# Patient Record
Sex: Male | Born: 1940 | Race: White | Hispanic: No | Marital: Married | Smoking: Former smoker
Health system: Southern US, Community
[De-identification: ages and names within clinical notes are randomized; demographics above are authoritative.]

## PROBLEM LIST (undated history)

## (undated) DIAGNOSIS — J449 Chronic obstructive pulmonary disease, unspecified: Secondary | ICD-10-CM

## (undated) DIAGNOSIS — I10 Essential (primary) hypertension: Secondary | ICD-10-CM

## (undated) DIAGNOSIS — Z950 Presence of cardiac pacemaker: Secondary | ICD-10-CM

## (undated) DIAGNOSIS — E785 Hyperlipidemia, unspecified: Secondary | ICD-10-CM

## (undated) DIAGNOSIS — I509 Heart failure, unspecified: Secondary | ICD-10-CM

## (undated) DIAGNOSIS — K219 Gastro-esophageal reflux disease without esophagitis: Secondary | ICD-10-CM

## (undated) DIAGNOSIS — N289 Disorder of kidney and ureter, unspecified: Secondary | ICD-10-CM

## (undated) HISTORY — PX: OTHER SURGICAL HISTORY: SHX169

---

## 1998-03-28 ENCOUNTER — Encounter: Payer: Self-pay | Admitting: Cardiovascular Disease

## 1998-03-28 ENCOUNTER — Ambulatory Visit (HOSPITAL_COMMUNITY): Admission: RE | Admit: 1998-03-28 | Discharge: 1998-03-28 | Payer: Self-pay | Admitting: Cardiovascular Disease

## 2000-10-24 ENCOUNTER — Inpatient Hospital Stay (HOSPITAL_COMMUNITY): Admission: AD | Admit: 2000-10-24 | Discharge: 2000-10-25 | Payer: Self-pay | Admitting: Cardiovascular Disease

## 2003-09-27 ENCOUNTER — Other Ambulatory Visit: Payer: Self-pay

## 2008-11-08 ENCOUNTER — Emergency Department: Payer: Self-pay | Admitting: Emergency Medicine

## 2008-11-08 IMAGING — CR DG CHEST 1V PORT
1 series · 1 of 1 positions shown · non-contrast
Comparison: none

REASON FOR EXAM: Chest Pain
COMMENTS:

PROCEDURE:     DXR - DXR PORTABLE CHEST SINGLE VIEW  - [DATE] [DATE]
RESULT:     Portable AP view of the chest shows the lung fields to be clear
of infiltrate. No pneumonia, pneumothorax or pleural effusion is seen. Heart
size is normal. Monitoring electrodes are present.

[view not recorded]
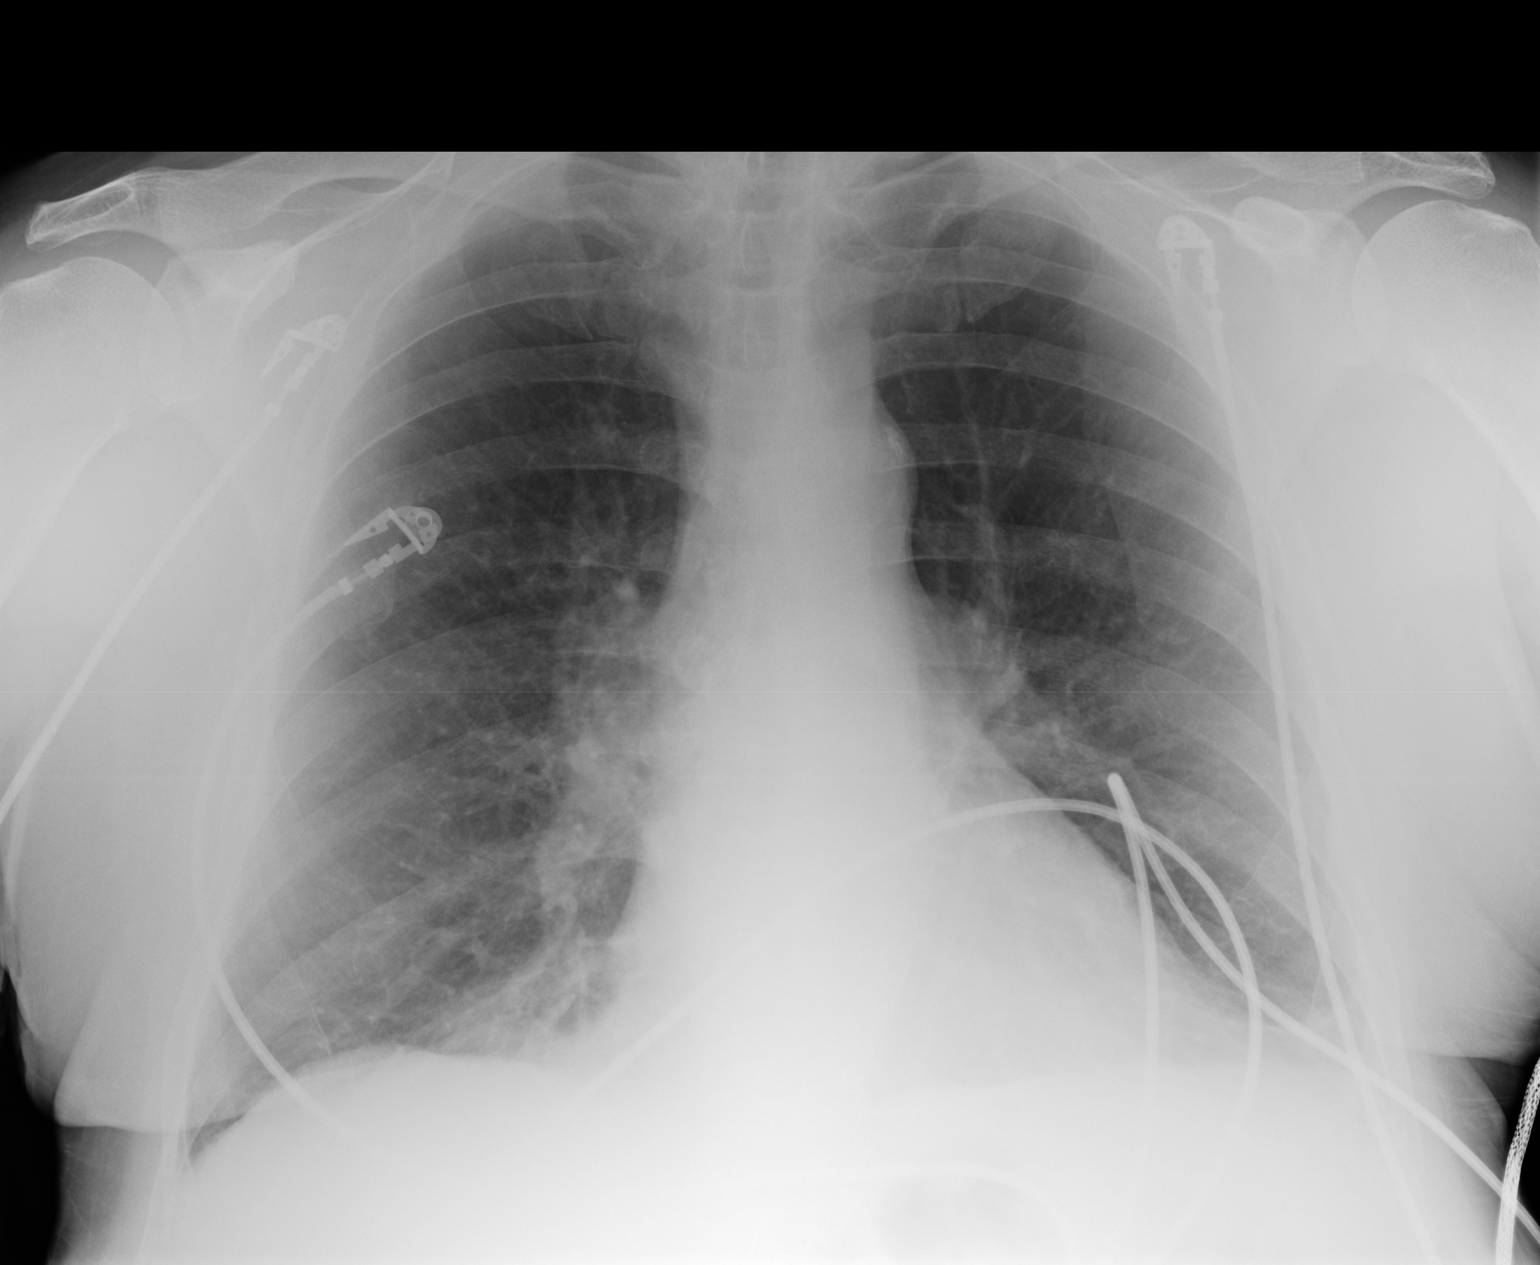

[1 of 1 positions shown; findings below may reference images not displayed]

IMPRESSION: No acute changes are identified.

## 2013-01-04 ENCOUNTER — Ambulatory Visit: Payer: Self-pay

## 2013-02-01 ENCOUNTER — Emergency Department: Payer: Self-pay | Admitting: Emergency Medicine

## 2013-02-01 LAB — BASIC METABOLIC PANEL
Anion Gap: 5 — ABNORMAL LOW (ref 7–16)
BUN: 23 mg/dL — ABNORMAL HIGH (ref 7–18)
Calcium, Total: 8.8 mg/dL (ref 8.5–10.1)
Chloride: 105 mmol/L (ref 98–107)
Co2: 29 mmol/L (ref 21–32)
Creatinine: 1.17 mg/dL (ref 0.60–1.30)
EGFR (African American): >60
EGFR (Non-African Amer.): >60
Glucose: 99 mg/dL (ref 65–99)
Osmolality: 281 (ref 275–301)
Potassium: 4.3 mmol/L (ref 3.5–5.1)
Sodium: 139 mmol/L (ref 136–145)

## 2013-02-01 LAB — CBC
HCT: 40.9 % (ref 40.0–52.0)
HGB: 13.8 g/dL (ref 13.0–18.0)
MCH: 30.7 pg (ref 26.0–34.0)
MCHC: 33.8 g/dL (ref 32.0–36.0)
MCV: 91 fL (ref 80–100)
Platelet: 150 10*3/uL (ref 150–440)
RBC: 4.51 10*6/uL (ref 4.40–5.90)
RDW: 13.2 % (ref 11.5–14.5)
WBC: 6.1 10*3/uL (ref 3.8–10.6)

## 2013-02-01 LAB — TROPONIN I: Troponin-I: <0.02 ng/mL

## 2013-02-01 IMAGING — CR DG CHEST 1V PORT
1 series · 1 of 1 positions shown · non-contrast
Comparison: [DATE]

CLINICAL DATA: Chest pain

EXAM:
PORTABLE CHEST - 1 VIEW

[ap]
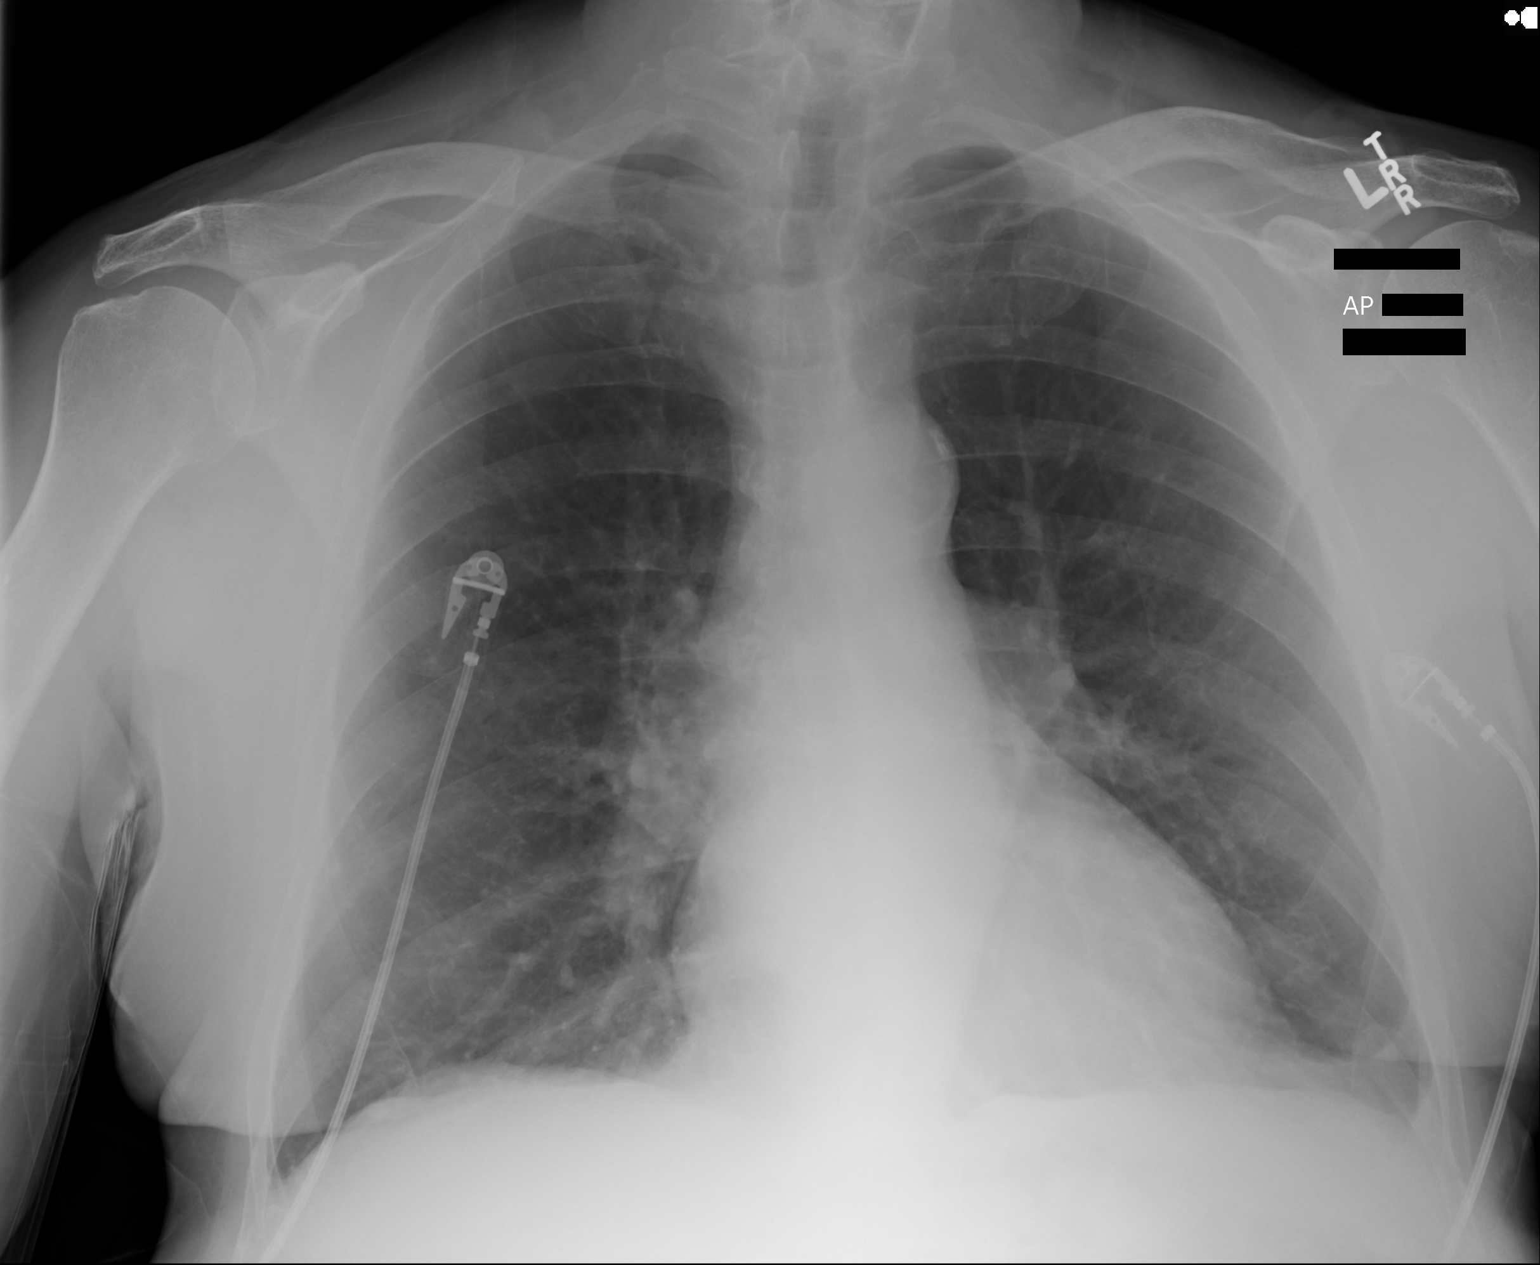

[1 of 1 positions shown; findings below may reference images not displayed]

FINDINGS: Heart size upper normal to mildly enlarged. Central vascular
prominence is similar to prior. Aortic atherosclerosis. Mild
interstitial prominence. No consolidation, pleural effusion, or
pneumothorax. Mild left lung base pleural thickening. Multilevel
degenerative changes
IMPRESSION: Heart size upper normal.  Central vascular prominence is similar.

Mild interstitial prominence, favored to be chronic. No focal
consolidation.

## 2019-11-20 ENCOUNTER — Other Ambulatory Visit: Payer: Self-pay

## 2019-11-20 ENCOUNTER — Encounter: Payer: No Typology Code available for payment source | Attending: Internal Medicine

## 2019-11-20 DIAGNOSIS — Z87891 Personal history of nicotine dependence: Secondary | ICD-10-CM | POA: Insufficient documentation

## 2019-11-20 DIAGNOSIS — J449 Chronic obstructive pulmonary disease, unspecified: Secondary | ICD-10-CM

## 2019-11-20 DIAGNOSIS — Z79899 Other long term (current) drug therapy: Secondary | ICD-10-CM | POA: Insufficient documentation

## 2019-11-20 DIAGNOSIS — Z7901 Long term (current) use of anticoagulants: Secondary | ICD-10-CM | POA: Insufficient documentation

## 2019-11-20 NOTE — Progress Notes (Signed)
Virtual Visit completed. Patient informed on EP and RD appointment and 6 Minute walk test. Patient also informed of patient health questionnaires on My Chart. Patient Verbalizes understanding. Visit diagnosis can be found in Media, patient is [facility]. 

## 2019-11-22 ENCOUNTER — Other Ambulatory Visit: Payer: Self-pay

## 2019-11-22 VITALS — Ht 64.5 in | Wt 182.6 lb

## 2019-11-22 DIAGNOSIS — Z7901 Long term (current) use of anticoagulants: Secondary | ICD-10-CM | POA: Diagnosis not present

## 2019-11-22 DIAGNOSIS — Z87891 Personal history of nicotine dependence: Secondary | ICD-10-CM | POA: Diagnosis not present

## 2019-11-22 DIAGNOSIS — Z79899 Other long term (current) drug therapy: Secondary | ICD-10-CM | POA: Diagnosis not present

## 2019-11-22 DIAGNOSIS — J449 Chronic obstructive pulmonary disease, unspecified: Secondary | ICD-10-CM

## 2019-11-22 NOTE — Progress Notes (Signed)
Pulmonary Individual Treatment Plan  Patient Details  Name: Phillip Chavez MRN: [id number] Date of Birth: January 24, 1941 Referring Provider:     Pulmonary Rehab from 11/22/2019 in [facility] Cardiac and Pulmonary Rehab  Referring Provider [name]      Initial Encounter Date:    Pulmonary Rehab from 11/22/2019 in [facility] Cardiac and Pulmonary Rehab  Date 11/22/19      Visit Diagnosis: Chronic obstructive pulmonary disease, unspecified COPD type ([place])  Patient's Home Medications on Admission:  Current Outpatient Medications:    albuterol (VENTOLIN HFA) 108 (90 Base) MCG/ACT inhaler, Inhale 2 puffs into the lungs 4 (four) times daily as needed for wheezing or shortness of breath., Disp: , Rfl:    docusate sodium (COLACE) 100 MG capsule, Take 100 mg by mouth 2 (two) times daily., Disp: , Rfl:    Fluticasone-Salmeterol (ADVAIR) 250-50 MCG/DOSE AEPB, Inhale 1 puff into the lungs 2 (two) times daily., Disp: , Rfl:    ketotifen (ZADITOR) 0.025 % ophthalmic solution, 1 drop 2 (two) times daily., Disp: , Rfl:    losartan (COZAAR) 50 MG tablet, Take 50 mg by mouth daily., Disp: , Rfl:    omeprazole (PRILOSEC) 20 MG capsule, Take 20 mg by mouth daily., Disp: , Rfl:    pseudoephedrine-acetaminophen (TYLENOL SINUS) 30-500 MG TABS tablet, Take 1 tablet by mouth every 4 (four) hours as needed., Disp: , Rfl:    rosuvastatin (CRESTOR) 40 MG tablet, Take 40 mg by mouth daily., Disp: , Rfl:   Past Medical History: No past medical history on file.  Tobacco Use: Social History   Tobacco Use  Smoking Status Former Smoker   Packs/day: 1.00   Years: 20.00   Pack years: 20.00   Types: Cigarettes   Quit date: 03/21/1978   Years since quitting: 41.7  Smokeless Tobacco Never Used    Labs: Recent Review Flowsheet Data   There is no flowsheet data to display.      Pulmonary Assessment Scores:  Pulmonary Assessment Scores    Row Name 11/22/19 1517         ADL UCSD   SOB Score total 41      Rest 1     Walk 2     Stairs 4     Bath 2     Dress 0       CAT Score   CAT Score 16       mMRC Score   mMRC Score 1            UCSD: Self-administered rating of dyspnea associated with activities of daily living (ADLs) 6-point scale (0 = "not at all" to 5 = "maximal or unable to do because of breathlessness")  Scoring Scores range from 0 to 120.  Minimally important difference is 5 units  CAT: CAT can identify the health impairment of COPD patients and is better correlated with disease progression.  CAT has a scoring range of zero to 40. The CAT score is classified into four groups of low (less than 10), medium (10 - 20), high (21-30) and very high (31-40) based on the impact level of disease on health status. A CAT score over 10 suggests significant symptoms.  A worsening CAT score could be explained by an exacerbation, poor medication adherence, poor inhaler technique, or progression of COPD or comorbid conditions.  CAT MCID is 2 points  mMRC: mMRC (Modified Medical Research Council) Dyspnea Scale is used to assess the degree of baseline functional disability in patients of respiratory  disease due to dyspnea. No minimal important difference is established. A decrease in score of 1 point or greater is considered a positive change.   Pulmonary Function Assessment:  Pulmonary Function Assessment - 11/20/19 0837      Initial Spirometry Results   FVC% 81 %    FEV1% 62 %    FEV1/FVC Ratio 59    Comments No post bronchodialator results      Breath   Shortness of Breath Yes;Limiting activity           Exercise Target Goals: Exercise Program Goal: Individual exercise prescription set using results from initial 6 min walk test and THRR while considering  patients activity barriers and safety.   Exercise Prescription Goal: Initial exercise prescription builds to 30-45 minutes a day of aerobic activity, 2-3 days per week.  Home exercise guidelines will be given to patient  during program as part of exercise prescription that the participant will acknowledge.  Education: Aerobic Exercise & Resistance Training: - Gives group verbal and written instruction on the various components of exercise. Focuses on aerobic and resistive training programs and the benefits of this training and how to safely progress through these programs..   Education: Exercise & Equipment Safety: - Individual verbal instruction and demonstration of equipment use and safety with use of the equipment.   Pulmonary Rehab from 11/22/2019 in [facility] Cardiac and Pulmonary Rehab  Date 11/22/19  Educator AS  Instruction Review Code 1- Verbalizes Understanding      Education: Exercise Physiology & General Exercise Guidelines: - Group verbal and written instruction with models to review the exercise physiology of the cardiovascular system and associated critical values. Provides general exercise guidelines with specific guidelines to those with heart or lung disease.    Education: Flexibility, Balance, Mind/Body Relaxation: Provides group verbal/written instruction on the benefits of flexibility and balance training, including mind/body exercise modes such as yoga, pilates and tai chi.  Demonstration and skill practice provided.   Activity Barriers & Risk Stratification:   6 Minute Walk:  6 Minute Walk    Row Name 11/22/19 1507         6 Minute Walk   Phase Initial     Distance 1160 feet     Walk Time 6 minutes     # of Rest Breaks 0     MPH 2.2     METS 1.9     RPE 11     Perceived Dyspnea  1     VO2 Peak 6.7     Symptoms No     Resting HR 68 bpm     Resting BP 124/64     Resting Oxygen Saturation  94 %     Exercise Oxygen Saturation  during 6 min walk 88 %     Max Ex. HR 100 bpm     Max Ex. BP 134/64     2 Minute Post BP 108/66       Interval HR   1 Minute HR 86     2 Minute HR 86     3 Minute HR 97     4 Minute HR 100     5 Minute HR 96     6 Minute HR 99     2 Minute  Post HR 80     Interval Heart Rate? Yes       Interval Oxygen   Interval Oxygen? Yes     Baseline Oxygen Saturation % 94 %  1 Minute Oxygen Saturation % 91 %     1 Minute Liters of Oxygen 0 L     2 Minute Oxygen Saturation % 88 %     2 Minute Liters of Oxygen 0 L     3 Minute Oxygen Saturation % 89 %     3 Minute Liters of Oxygen 0 L     4 Minute Oxygen Saturation % 88 %     4 Minute Liters of Oxygen 0 L     5 Minute Oxygen Saturation % 89 %     5 Minute Liters of Oxygen 0 L     6 Minute Oxygen Saturation % 88 %     6 Minute Liters of Oxygen 0 L     2 Minute Post Oxygen Saturation % 93 %     2 Minute Post Liters of Oxygen 0 L           Oxygen Initial Assessment:  Oxygen Initial Assessment - 11/20/19 0836      Home Oxygen   Home Oxygen Device None    Sleep Oxygen Prescription None    Home Exercise Oxygen Prescription None    Home Resting Oxygen Prescription None    Compliance with Home Oxygen Use Yes      Initial 6 min Walk   Oxygen Used None      Program Oxygen Prescription   Program Oxygen Prescription None      Intervention   Short Term Goals To learn and understand importance of monitoring SPO2 with pulse oximeter and demonstrate accurate use of the pulse oximeter.;To learn and understand importance of maintaining oxygen saturations>88%;To learn and demonstrate proper pursed lip breathing techniques or other breathing techniques.;To learn and demonstrate proper use of respiratory medications    Long  Term Goals Verbalizes importance of monitoring SPO2 with pulse oximeter and return demonstration;Maintenance of O2 saturations>88%;Exhibits proper breathing techniques, such as pursed lip breathing or other method taught during program session;Compliance with respiratory medication;Demonstrates proper use of MDIs           Oxygen Re-Evaluation:   Oxygen Discharge (Final Oxygen Re-Evaluation):   Initial Exercise Prescription:  Initial Exercise Prescription -  11/22/19 1500      Date of Initial Exercise RX and Referring Provider   Date 11/22/19    Referring Provider [name]      Treadmill   MPH 1.5    Grade 0.5    Minutes 15    METs 2      Recumbant Bike   Level 1    RPM 60    Minutes 15    METs 2      NuStep   Level 2    SPM 80    Minutes 15    METs 2      T5 Nustep   Level 1    SPM 80    Minutes 15    METs 2      Prescription Details   Frequency (times per week) 3    Duration Progress to 30 minutes of continuous aerobic without signs/symptoms of physical distress      Intensity   THRR 40-80% of Max Heartrate 97-126    Ratings of Perceived Exertion 11-15    Perceived Dyspnea 0-4      Progression   Progression Continue to progress workloads to maintain intensity without signs/symptoms of physical distress.      Resistance Training   Training Prescription Yes    Weight 3  lb    Reps 10-15           Perform Capillary Blood Glucose checks as needed.  Exercise Prescription Changes:  Exercise Prescription Changes    Row Name 11/22/19 1500             Response to Exercise   Blood Pressure (Admit) 124/64       Blood Pressure (Exercise) 134/64       Blood Pressure (Exit) 108/66       Heart Rate (Admit) 68 bpm       Heart Rate (Exercise) 100 bpm       Heart Rate (Exit) 80 bpm       Oxygen Saturation (Admit) 94 %       Oxygen Saturation (Exercise) 88 %       Oxygen Saturation (Exit) 93 %       Rating of Perceived Exertion (Exercise) 11       Perceived Dyspnea (Exercise) 1       Symptoms none              Exercise Comments:   Exercise Goals and Review:  Exercise Goals    Row Name 11/22/19 1514             Exercise Goals   Increase Physical Activity Yes       Intervention Provide advice, education, support and counseling about physical activity/exercise needs.;Develop an individualized exercise prescription for aerobic and resistive training based on initial evaluation findings, risk  stratification, comorbidities and participant's personal goals.       Expected Outcomes Short Term: Attend rehab on a regular basis to increase amount of physical activity.;Long Term: Add in home exercise to make exercise part of routine and to increase amount of physical activity.;Long Term: Exercising regularly at least 3-5 days a week.       Increase Strength and Stamina Yes       Intervention Provide advice, education, support and counseling about physical activity/exercise needs.;Develop an individualized exercise prescription for aerobic and resistive training based on initial evaluation findings, risk stratification, comorbidities and participant's personal goals.       Expected Outcomes Short Term: Increase workloads from initial exercise prescription for resistance, speed, and METs.;Short Term: Perform resistance training exercises routinely during rehab and add in resistance training at home;Long Term: Improve cardiorespiratory fitness, muscular endurance and strength as measured by increased METs and functional capacity (6MWT)       Able to understand and use rate of perceived exertion (RPE) scale Yes       Intervention Provide education and explanation on how to use RPE scale       Expected Outcomes Short Term: Able to use RPE daily in rehab to express subjective intensity level;Long Term:  Able to use RPE to guide intensity level when exercising independently       Able to understand and use Dyspnea scale Yes       Intervention Provide education and explanation on how to use Dyspnea scale       Expected Outcomes Short Term: Able to use Dyspnea scale daily in rehab to express subjective sense of shortness of breath during exertion;Long Term: Able to use Dyspnea scale to guide intensity level when exercising independently       Knowledge and understanding of Target Heart Rate Range (THRR) Yes       Intervention Provide education and explanation of THRR including how the numbers were predicted  and where they are located for  reference       Expected Outcomes Short Term: Able to state/look up THRR;Short Term: Able to use daily as guideline for intensity in rehab;Long Term: Able to use THRR to govern intensity when exercising independently       Able to check pulse independently Yes       Intervention Provide education and demonstration on how to check pulse in carotid and radial arteries.;Review the importance of being able to check your own pulse for safety during independent exercise       Expected Outcomes Short Term: Able to explain why pulse checking is important during independent exercise;Long Term: Able to check pulse independently and accurately       Understanding of Exercise Prescription Yes              Exercise Goals Re-Evaluation :   Discharge Exercise Prescription (Final Exercise Prescription Changes):  Exercise Prescription Changes - 11/22/19 1500      Response to Exercise   Blood Pressure (Admit) 124/64    Blood Pressure (Exercise) 134/64    Blood Pressure (Exit) 108/66    Heart Rate (Admit) 68 bpm    Heart Rate (Exercise) 100 bpm    Heart Rate (Exit) 80 bpm    Oxygen Saturation (Admit) 94 %    Oxygen Saturation (Exercise) 88 %    Oxygen Saturation (Exit) 93 %    Rating of Perceived Exertion (Exercise) 11    Perceived Dyspnea (Exercise) 1    Symptoms none           Nutrition:  Target Goals: Understanding of nutrition guidelines, daily intake of sodium <1541m, cholesterol <202m calories 30% from fat and 7% or less from saturated fats, daily to have 5 or more servings of fruits and vegetables.  Education: Controlling Sodium/Reading Food Labels -Group verbal and written material supporting the discussion of sodium use in heart healthy nutrition. Review and explanation with models, verbal and written materials for utilization of the food label.   Education: General Nutrition Guidelines/Fats and Fiber: -Group instruction provided by verbal, written  material, models and posters to present the general guidelines for heart healthy nutrition. Gives an explanation and review of dietary fats and fiber.   Biometrics:  Pre Biometrics - 11/22/19 1514      Pre Biometrics   Height 5' 4.5" (1.638 m)    Weight 182 lb 9.6 oz (82.8 kg)    BMI (Calculated) 30.87    Single Leg Stand 22.35 seconds            Nutrition Therapy Plan and Nutrition Goals:   Nutrition Assessments:  Nutrition Assessments - 11/22/19 1516      Rate Your Plate Scores   Pre Score 28           MEDIFICTS Score Key:          ?70 Need to make dietary changes          40-70 Heart Healthy Diet         ? 40 Therapeutic Level Cholesterol Diet  Nutrition Goals Re-Evaluation:   Nutrition Goals Discharge (Final Nutrition Goals Re-Evaluation):   Psychosocial: Target Goals: Acknowledge presence or absence of significant depression and/or stress, maximize coping skills, provide positive support system. Participant is able to verbalize types and ability to use techniques and skills needed for reducing stress and depression.   Education: Depression - Provides group verbal and written instruction on the correlation between heart/lung disease and depressed mood, treatment options, and the stigmas associated with  seeking treatment.   Education: Sleep Hygiene -Provides group verbal and written instruction about how sleep can affect your health.  Define sleep hygiene, discuss sleep cycles and impact of sleep habits. Review good sleep hygiene tips.    Education: Stress and Anxiety: - Provides group verbal and written instruction about the health risks of elevated stress and causes of high stress.  Discuss the correlation between heart/lung disease and anxiety and treatment options. Review healthy ways to manage with stress and anxiety.   Initial Review & Psychosocial Screening:  Initial Psych Review & Screening - 11/20/19 0840      Initial Review   Current issues  with None Identified      Family Dynamics   Good Support System? Yes    Comments He can look to his wife and son for support. He is willing to stay healthy and has a positive attitude.      Barriers   Psychosocial barriers to participate in program There are no identifiable barriers or psychosocial needs.;The patient should benefit from training in stress management and relaxation.      Screening Interventions   Interventions Encouraged to exercise;To provide support and resources with identified psychosocial needs;Provide feedback about the scores to participant    Expected Outcomes Short Term goal: Utilizing psychosocial counselor, staff and physician to assist with identification of specific Stressors or current issues interfering with healing process. Setting desired goal for each stressor or current issue identified.;Long Term Goal: Stressors or current issues are controlled or eliminated.;Short Term goal: Identification and review with participant of any Quality of Life or Depression concerns found by scoring the questionnaire.;Long Term goal: The participant improves quality of Life and PHQ9 Scores as seen by post scores and/or verbalization of changes           Quality of Life Scores:  Scores of 19 and below usually indicate a poorer quality of life in these areas.  A difference of  2-3 points is a clinically meaningful difference.  A difference of 2-3 points in the total score of the Quality of Life Index has been associated with significant improvement in overall quality of life, self-image, physical symptoms, and general health in studies assessing change in quality of life.  PHQ-9: Recent Review Flowsheet Data    Depression screen [facility] 2/9 11/22/2019   Decreased Interest 0   Down, Depressed, Hopeless 0   PHQ - 2 Score 0   Altered sleeping 2    Tired, decreased energy 1   Change in appetite 2   Feeling bad or failure about yourself  0   Trouble concentrating 0   Moving slowly  or fidgety/restless 0   Suicidal thoughts 0   PHQ-9 Score 5   Difficult doing work/chores Not difficult at all     Interpretation of Total Score  Total Score Depression Severity:  1-4 = Minimal depression, 5-9 = Mild depression, 10-14 = Moderate depression, 15-19 = Moderately severe depression, 20-27 = Severe depression   Psychosocial Evaluation and Intervention:  Psychosocial Evaluation - 11/20/19 0842      Psychosocial Evaluation & Interventions   Interventions Encouraged to exercise with the program and follow exercise prescription    Comments He can look to his wife and son for support. He is willing to stay healthy and has a positive attitude.    Expected Outcomes Short: Exercise regularly to support mental health and notify staff of any changes. Long: maintain mental health and well being through teaching of rehab or prescribed  medications independently.    Continue Psychosocial Services  Follow up required by staff           Psychosocial Re-Evaluation:   Psychosocial Discharge (Final Psychosocial Re-Evaluation):   Education: Education Goals: Education classes will be provided on a weekly basis, covering required topics. Participant will state understanding/return demonstration of topics presented.  Learning Barriers/Preferences:  Learning Barriers/Preferences - 11/20/19 0837      Learning Barriers/Preferences   Learning Barriers None    Learning Preferences None           General Pulmonary Education Topics:  Infection Prevention: - Provides verbal and written material to individual with discussion of infection control including proper hand washing and proper equipment cleaning during exercise session.   Pulmonary Rehab from 11/22/2019 in [facility] Cardiac and Pulmonary Rehab  Date 11/22/19  Educator AS  Instruction Review Code 1- Verbalizes Understanding      Falls Prevention: - Provides verbal and written material to individual with discussion of falls  prevention and safety.   Pulmonary Rehab from 11/22/2019 in [facility] Cardiac and Pulmonary Rehab  Date 11/22/19  Educator AS  Instruction Review Code 1- Verbalizes Understanding      Chronic Lung Diseases: - Group verbal and written instruction to review updates, respiratory medications, advancements in procedures and treatments. Discuss use of supplemental oxygen including available portable oxygen systems, continuous and intermittent flow rates, concentrators, personal use and safety guidelines. Review proper use of inhaler and spacers. Provide informative websites for self-education.    Energy Conservation: - Provide group verbal and written instruction for methods to conserve energy, plan and organize activities. Instruct on pacing techniques, use of adaptive equipment and posture/positioning to relieve shortness of breath.   Triggers and Exacerbations: - Group verbal and written instruction to review types of environmental triggers and ways to prevent exacerbations. Discuss weather changes, air quality and the benefits of nasal washing. Review warning signs and symptoms to help prevent infections. Discuss techniques for effective airway clearance, coughing, and vibrations.   AED/CPR: - Group verbal and written instruction with the use of models to demonstrate the basic use of the AED with the basic ABC's of resuscitation.   Anatomy and Physiology of the Lungs: - Group verbal and written instruction with the use of models to provide basic lung anatomy and physiology related to function, structure and complications of lung disease.   Anatomy & Physiology of the Heart: - Group verbal and written instruction and models provide basic cardiac anatomy and physiology, with the coronary electrical and arterial systems. Review of Valvular disease and Heart Failure   Cardiac Medications: - Group verbal and written instruction to review commonly prescribed medications for heart disease. Reviews  the medication, class of the drug, and side effects.   Other: -Provides group and verbal instruction on various topics (see comments)   Knowledge Questionnaire Score:  Knowledge Questionnaire Score - 11/22/19 1516      Knowledge Questionnaire Score   Pre Score 15/18            Core Components/Risk Factors/Patient Goals at Admission:  Personal Goals and Risk Factors at Admission - 11/22/19 1515      Core Components/Risk Factors/Patient Goals on Admission    Weight Management Yes;Weight Loss    Intervention Weight Management: Provide education and appropriate resources to help participant work on and attain dietary goals.;Weight Management: Develop a combined nutrition and exercise program designed to reach desired caloric intake, while maintaining appropriate intake of nutrient and fiber, sodium and fats,  and appropriate energy expenditure required for the weight goal.;Weight Management/Obesity: Establish reasonable short term and long term weight goals.    Admit Weight 182 lb 9.6 oz (82.8 kg)    Goal Weight: Short Term 175 lb (79.4 kg)    Goal Weight: Long Term 170 lb (77.1 kg)    Expected Outcomes Short Term: Continue to assess and modify interventions until short term weight is achieved;Long Term: Adherence to nutrition and physical activity/exercise program aimed toward attainment of established weight goal;Weight Maintenance: Understanding of the daily nutrition guidelines, which includes 25-35% calories from fat, 7% or less cal from saturated fats, less than 282m cholesterol, less than 1.5gm of sodium, & 5 or more servings of fruits and vegetables daily;Weight Loss: Understanding of general recommendations for a balanced deficit meal plan, which promotes 1-2 lb weight loss per week and includes a negative energy balance of [phone] kcal/d;Understanding recommendations for meals to include 15-35% energy as protein, 25-35% energy from fat, 35-60% energy from carbohydrates, less than  2060mof dietary cholesterol, 20-35 gm of total fiber daily;Understanding of distribution of calorie intake throughout the day with the consumption of 4-5 meals/snacks    Improve shortness of breath with ADL's Yes    Intervention Provide education, individualized exercise plan and daily activity instruction to help decrease symptoms of SOB with activities of daily living.    Expected Outcomes Short Term: Improve cardiorespiratory fitness to achieve a reduction of symptoms when performing ADLs;Long Term: Be able to perform more ADLs without symptoms or delay the onset of symptoms    Hypertension Yes    Intervention Provide education on lifestyle modifcations including regular physical activity/exercise, weight management, moderate sodium restriction and increased consumption of fresh fruit, vegetables, and low fat dairy, alcohol moderation, and smoking cessation.;Monitor prescription use compliance.    Expected Outcomes Short Term: Continued assessment and intervention until BP is < 140/9037mG in hypertensive participants. < 130/53m84m in hypertensive participants with diabetes, heart failure or chronic kidney disease.;Long Term: Maintenance of blood pressure at goal levels.    Lipids Yes    Intervention Provide education and support for participant on nutrition & aerobic/resistive exercise along with prescribed medications to achieve LDL <70mg45mL >40mg.62mExpected Outcomes Short Term: Participant states understanding of desired cholesterol values and is compliant with medications prescribed. Participant is following exercise prescription and nutrition guidelines.;Long Term: Cholesterol controlled with medications as prescribed, with individualized exercise RX and with personalized nutrition plan. Value goals: LDL < 70mg, 25m> 40 mg.           Education:Diabetes - Individual verbal and written instruction to review signs/symptoms of diabetes, desired ranges of glucose level fasting, after meals  and with exercise. Acknowledge that pre and post exercise glucose checks will be done for 3 sessions at entry of program.   Education: Know Your Numbers and Risk Factors: -Group verbal and written instruction about important numbers in your health.  Discussion of what are risk factors and how they play a role in the disease process.  Review of Cholesterol, Blood Pressure, Diabetes, and BMI and the role they play in your overall health.   Core Components/Risk Factors/Patient Goals Review:    Core Components/Risk Factors/Patient Goals at Discharge (Final Review):    ITP Comments:  ITP Comments    Row Name 11/20/19 0844           ITP Comments Virtual Visit completed. Patient informed on EP and RD appointment and 6 Minute walk test. Patient  also informed of patient health questionnaires on My Chart. Patient Verbalizes understanding. Visit diagnosis can be found in Media, patient is [place].              Comments: initial ITP

## 2019-11-22 NOTE — Patient Instructions (Signed)
Patient Instructions  Patient Details  Name: Phillip Chavez MRN: [id number] Date of Birth: 04/11/1940 Referring Provider:  Felipa Eth, MD  Below are your personal goals for exercise, nutrition, and risk factors. Our goal is to help you stay on track towards obtaining and maintaining these goals. We will be discussing your progress on these goals with you throughout the program.  Initial Exercise Prescription:  Initial Exercise Prescription - 11/22/19 1500      Date of Initial Exercise RX and Referring Provider   Date 11/22/19    Referring Provider [name]      Treadmill   MPH 1.5    Grade 0.5    Minutes 15    METs 2      Recumbant Bike   Level 1    RPM 60    Minutes 15    METs 2      NuStep   Level 2    SPM 80    Minutes 15    METs 2      T5 Nustep   Level 1    SPM 80    Minutes 15    METs 2      Prescription Details   Frequency (times per week) 3    Duration Progress to 30 minutes of continuous aerobic without signs/symptoms of physical distress      Intensity   THRR 40-80% of Max Heartrate 97-126    Ratings of Perceived Exertion 11-15    Perceived Dyspnea 0-4      Progression   Progression Continue to progress workloads to maintain intensity without signs/symptoms of physical distress.      Resistance Training   Training Prescription Yes    Weight 3 lb    Reps 10-15           Exercise Goals: Frequency: Be able to perform aerobic exercise two to three times per week in program working toward 2-5 days per week of home exercise.  Intensity: Work with a perceived exertion of 11 (fairly light) - 15 (hard) while following your exercise prescription.  We will make changes to your prescription with you as you progress through the program.   Duration: Be able to do 30 to 45 minutes of continuous aerobic exercise in addition to a 5 minute warm-up and a 5 minute cool-down routine.   Nutrition Goals: Your personal nutrition goals will be established  when you do your nutrition analysis with the dietician.  The following are general nutrition guidelines to follow: Cholesterol < 200mg /day Sodium < 1500mg /day Fiber: Men over 50 yrs - 30 grams per day  Personal Goals:  Personal Goals and Risk Factors at Admission - 11/22/19 1515      Core Components/Risk Factors/Patient Goals on Admission    Weight Management Yes;Weight Loss    Intervention Weight Management: Provide education and appropriate resources to help participant work on and attain dietary goals.;Weight Management: Develop a combined nutrition and exercise program designed to reach desired caloric intake, while maintaining appropriate intake of nutrient and fiber, sodium and fats, and appropriate energy expenditure required for the weight goal.;Weight Management/Obesity: Establish reasonable short term and long term weight goals.    Admit Weight 182 lb 9.6 oz (82.8 kg)    Goal Weight: Short Term 175 lb (79.4 kg)    Goal Weight: Long Term 170 lb (77.1 kg)    Expected Outcomes Short Term: Continue to assess and modify interventions until short term weight is achieved;Long Term: Adherence to nutrition and  physical activity/exercise program aimed toward attainment of established weight goal;Weight Maintenance: Understanding of the daily nutrition guidelines, which includes 25-35% calories from fat, 7% or less cal from saturated fats, less than 200mg  cholesterol, less than 1.5gm of sodium, & 5 or more servings of fruits and vegetables daily;Weight Loss: Understanding of general recommendations for a balanced deficit meal plan, which promotes 1-2 lb weight loss per week and includes a negative energy balance of [phone] kcal/d;Understanding recommendations for meals to include 15-35% energy as protein, 25-35% energy from fat, 35-60% energy from carbohydrates, less than 200mg  of dietary cholesterol, 20-35 gm of total fiber daily;Understanding of distribution of calorie intake throughout the day  with the consumption of 4-5 meals/snacks    Improve shortness of breath with ADL's Yes    Intervention Provide education, individualized exercise plan and daily activity instruction to help decrease symptoms of SOB with activities of daily living.    Expected Outcomes Short Term: Improve cardiorespiratory fitness to achieve a reduction of symptoms when performing ADLs;Long Term: Be able to perform more ADLs without symptoms or delay the onset of symptoms    Hypertension Yes    Intervention Provide education on lifestyle modifcations including regular physical activity/exercise, weight management, moderate sodium restriction and increased consumption of fresh fruit, vegetables, and low fat dairy, alcohol moderation, and smoking cessation.;Monitor prescription use compliance.    Expected Outcomes Short Term: Continued assessment and intervention until BP is < 140/63mm HG in hypertensive participants. < 130/1mm HG in hypertensive participants with diabetes, heart failure or chronic kidney disease.;Long Term: Maintenance of blood pressure at goal levels.    Lipids Yes    Intervention Provide education and support for participant on nutrition & aerobic/resistive exercise along with prescribed medications to achieve LDL 70mg , HDL >40mg .    Expected Outcomes Short Term: Participant states understanding of desired cholesterol values and is compliant with medications prescribed. Participant is following exercise prescription and nutrition guidelines.;Long Term: Cholesterol controlled with medications as prescribed, with individualized exercise RX and with personalized nutrition plan. Value goals: LDL < 70mg , HDL > 40 mg.           Tobacco Use Initial Evaluation: Social History   Tobacco Use  Smoking Status Former Smoker  . Packs/day: 1.00  . Years: 20.00  . Pack years: 20.00  . Types: Cigarettes  . Quit date: 03/21/1978  . Years since quitting: 41.7  Smokeless Tobacco Never Used    Exercise  Goals and Review:  Exercise Goals    Row Name 11/22/19 1514             Exercise Goals   Increase Physical Activity Yes       Intervention Provide advice, education, support and counseling about physical activity/exercise needs.;Develop an individualized exercise prescription for aerobic and resistive training based on initial evaluation findings, risk stratification, comorbidities and participant's personal goals.       Expected Outcomes Short Term: Attend rehab on a regular basis to increase amount of physical activity.;Long Term: Add in home exercise to make exercise part of routine and to increase amount of physical activity.;Long Term: Exercising regularly at least 3-5 days a week.       Increase Strength and Stamina Yes       Intervention Provide advice, education, support and counseling about physical activity/exercise needs.;Develop an individualized exercise prescription for aerobic and resistive training based on initial evaluation findings, risk stratification, comorbidities and participant's personal goals.       Expected Outcomes Short Term: Increase workloads  from initial exercise prescription for resistance, speed, and METs.;Short Term: Perform resistance training exercises routinely during rehab and add in resistance training at home;Long Term: Improve cardiorespiratory fitness, muscular endurance and strength as measured by increased METs and functional capacity (6MWT)       Able to understand and use rate of perceived exertion (RPE) scale Yes       Intervention Provide education and explanation on how to use RPE scale       Expected Outcomes Short Term: Able to use RPE daily in rehab to express subjective intensity level;Long Term:  Able to use RPE to guide intensity level when exercising independently       Able to understand and use Dyspnea scale Yes       Intervention Provide education and explanation on how to use Dyspnea scale       Expected Outcomes Short Term: Able to use  Dyspnea scale daily in rehab to express subjective sense of shortness of breath during exertion;Long Term: Able to use Dyspnea scale to guide intensity level when exercising independently       Knowledge and understanding of Target Heart Rate Range (THRR) Yes       Intervention Provide education and explanation of THRR including how the numbers were predicted and where they are located for reference       Expected Outcomes Short Term: Able to state/look up THRR;Short Term: Able to use daily as guideline for intensity in rehab;Long Term: Able to use THRR to govern intensity when exercising independently       Able to check pulse independently Yes       Intervention Provide education and demonstration on how to check pulse in carotid and radial arteries.;Review the importance of being able to check your own pulse for safety during independent exercise       Expected Outcomes Short Term: Able to explain why pulse checking is important during independent exercise;Long Term: Able to check pulse independently and accurately       Understanding of Exercise Prescription Yes              Copy of goals given to participant.

## 2019-11-26 ENCOUNTER — Other Ambulatory Visit: Payer: Self-pay

## 2019-11-26 ENCOUNTER — Encounter: Payer: No Typology Code available for payment source | Admitting: *Deleted

## 2019-11-26 DIAGNOSIS — J449 Chronic obstructive pulmonary disease, unspecified: Secondary | ICD-10-CM

## 2019-11-26 NOTE — Progress Notes (Signed)
Daily Session Note  Patient Details  Name: Phillip Chavez MRN: [id number] Date of Birth: March 26, 1940 Referring Provider:     Pulmonary Rehab from 11/22/2019 in [facility] Cardiac and Pulmonary Rehab  Referring Provider [name]      Encounter Date: 11/26/2019  Check In:  Session Check In - 11/26/19 1533      Check-In   Supervising physician immediately available to respond to emergencies See telemetry face sheet for immediately available ER MD    Location [facility]-Cardiac & Pulmonary Rehab    Staff Present Renita Papa, RN Margurite Auerbach, MS Exercise Physiologist;Kelly Amedeo Plenty, BS, ACSM CEP, Exercise Physiologist    Virtual Visit No    Medication changes reported     No    Fall or balance concerns reported    No    Warm-up and Cool-down Performed on first and last piece of equipment    Resistance Training Performed Yes    VAD Patient? No    PAD/SET Patient? No      Pain Assessment   Currently in Pain? No/denies              Social History   Tobacco Use  Smoking Status Former Smoker  . Packs/day: 1.00  . Years: 20.00  . Pack years: 20.00  . Types: Cigarettes  . Quit date: 03/21/1978  . Years since quitting: 41.7  Smokeless Tobacco Never Used    Goals Met:  Independence with exercise equipment Exercise tolerated well No report of cardiac concerns or symptoms Strength training completed today  Goals Unmet:  Not Applicable  Comments: First full day of exercise!  Patient was oriented to gym and equipment including functions, settings, policies, and procedures.  Patient's individual exercise prescription and treatment plan were reviewed.  All starting workloads were established based on the results of the 6 minute walk test done at initial orientation visit.  The plan for exercise progression was also introduced and progression will be customized based on patient's performance and goals.     Dr. Emily Filbert is Medical Director for [place] and  [facility].

## 2019-11-28 ENCOUNTER — Encounter: Payer: No Typology Code available for payment source | Admitting: *Deleted

## 2019-11-28 ENCOUNTER — Other Ambulatory Visit: Payer: Self-pay

## 2019-11-28 DIAGNOSIS — J449 Chronic obstructive pulmonary disease, unspecified: Secondary | ICD-10-CM | POA: Diagnosis not present

## 2019-11-28 NOTE — Progress Notes (Signed)
Daily Session Note  Patient Details  Name: Phillip Chavez MRN: [id number] Date of Birth: 06/14/1940 Referring Provider:     Pulmonary Rehab from 11/22/2019 in [facility] Cardiac and Pulmonary Rehab  Referring Provider [name]      Encounter Date: 11/28/2019  Check In:  Session Check In - 11/28/19 1556      Check-In   Supervising physician immediately available to respond to emergencies See telemetry face sheet for immediately available ER MD    Location [facility]-Cardiac & Pulmonary Rehab    Staff Present Renita Papa, RN Margurite Auerbach, MS Exercise Physiologist;Amanda Oletta Darter, BA, ACSM CEP, Exercise Physiologist;Joseph Tessie Fass RCP,RRT,BSRT    Virtual Visit No    Medication changes reported     No    Fall or balance concerns reported    No    Warm-up and Cool-down Performed on first and last piece of equipment    Resistance Training Performed Yes    VAD Patient? No    PAD/SET Patient? No      Pain Assessment   Currently in Pain? No/denies              Social History   Tobacco Use  Smoking Status Former Smoker  . Packs/day: 1.00  . Years: 20.00  . Pack years: 20.00  . Types: Cigarettes  . Quit date: 03/21/1978  . Years since quitting: 41.7  Smokeless Tobacco Never Used    Goals Met:  Independence with exercise equipment Exercise tolerated well No report of cardiac concerns or symptoms Strength training completed today  Goals Unmet:  Not Applicable  Comments: Pt able to follow exercise prescription today without complaint.  Will continue to monitor for progression.    Dr. Emily Filbert is Medical Director for [place] and [facility].

## 2019-11-29 ENCOUNTER — Other Ambulatory Visit: Payer: Self-pay

## 2019-11-29 ENCOUNTER — Encounter: Payer: No Typology Code available for payment source | Admitting: *Deleted

## 2019-11-29 DIAGNOSIS — J449 Chronic obstructive pulmonary disease, unspecified: Secondary | ICD-10-CM

## 2019-11-29 NOTE — Progress Notes (Signed)
Daily Session Note  Patient Details  Name: Phillip Chavez MRN: [id number] Date of Birth: May 19, 1940 Referring Provider:     Pulmonary Rehab from 11/22/2019 in [facility] Cardiac and Pulmonary Rehab  Referring Provider [name]      Encounter Date: 11/29/2019  Check In:  Session Check In - 11/29/19 1528      Check-In   Supervising physician immediately available to respond to emergencies See telemetry face sheet for immediately available ER MD    Location [facility]-Cardiac & Pulmonary Rehab    Staff Present Renita Papa, RN BSN;Joseph Hood RCP,RRT,BSRT;Melissa Ralls RDN, LDN    Virtual Visit No    Medication changes reported     No    Fall or balance concerns reported    No    Warm-up and Cool-down Performed on first and last piece of equipment    Resistance Training Performed Yes    VAD Patient? No    PAD/SET Patient? No      Pain Assessment   Currently in Pain? No/denies              Social History   Tobacco Use  Smoking Status Former Smoker  . Packs/day: 1.00  . Years: 20.00  . Pack years: 20.00  . Types: Cigarettes  . Quit date: 03/21/1978  . Years since quitting: 41.7  Smokeless Tobacco Never Used    Goals Met:  Independence with exercise equipment Exercise tolerated well No report of cardiac concerns or symptoms Strength training completed today  Goals Unmet:  Not Applicable  Comments: Pt able to follow exercise prescription today without complaint.  Will continue to monitor for progression.    Dr. Emily Filbert is Medical Director for [place] and [facility].

## 2019-12-03 ENCOUNTER — Other Ambulatory Visit: Payer: Self-pay

## 2019-12-03 ENCOUNTER — Encounter: Payer: No Typology Code available for payment source | Attending: Internal Medicine | Admitting: *Deleted

## 2019-12-03 DIAGNOSIS — J449 Chronic obstructive pulmonary disease, unspecified: Secondary | ICD-10-CM | POA: Insufficient documentation

## 2019-12-03 NOTE — Progress Notes (Signed)
Daily Session Note  Patient Details  Name: Phillip Chavez MRN: [id number] Date of Birth: 10/17/1940 Referring Provider:     Pulmonary Rehab from 11/22/2019 in [facility] Cardiac and Pulmonary Rehab  Referring Provider Phillip Chavez      Encounter Date: 12/03/2019  Check In:  Session Check In - 12/03/19 1529      Check-In   Supervising physician immediately available to respond to emergencies See telemetry face sheet for immediately available ER MD    Location [facility]-Cardiac & Pulmonary Rehab    Staff Present Renita Papa, RN Margurite Auerbach, MS Exercise Physiologist;Kelly Amedeo Plenty, BS, ACSM CEP, Exercise Physiologist    Virtual Visit No    Medication changes reported     No    Fall or balance concerns reported    No    Warm-up and Cool-down Performed on first and last piece of equipment    Resistance Training Performed Yes    VAD Patient? No    PAD/SET Patient? No      Pain Assessment   Currently in Pain? No/denies              Social History   Tobacco Use  Smoking Status Former Smoker  . Packs/day: 1.00  . Years: 20.00  . Pack years: 20.00  . Types: Cigarettes  . Quit date: 03/21/1978  . Years since quitting: 41.7  Smokeless Tobacco Never Used    Goals Met:  Independence with exercise equipment Exercise tolerated well No report of cardiac concerns or symptoms Strength training completed today  Goals Unmet:  Not Applicable  Comments: Pt able to follow exercise prescription today without complaint.  Will continue to monitor for progression.    Dr. Emily Filbert is Medical Director for [place] and [facility].

## 2019-12-05 ENCOUNTER — Encounter: Payer: Self-pay | Admitting: *Deleted

## 2019-12-05 DIAGNOSIS — J449 Chronic obstructive pulmonary disease, unspecified: Secondary | ICD-10-CM

## 2019-12-05 NOTE — Progress Notes (Signed)
Pulmonary Individual Treatment Plan  Patient Details  Name: Phillip Chavez MRN: [id number] Date of Birth: Mar 06, 1940 Referring Provider:     Pulmonary Rehab from 11/22/2019 in [facility] Cardiac and Pulmonary Rehab  Referring Provider [name]      Initial Encounter Date:    Pulmonary Rehab from 11/22/2019 in [facility] Cardiac and Pulmonary Rehab  Date 11/22/19      Visit Diagnosis: Chronic obstructive pulmonary disease, unspecified COPD type ([place])  Patient's Home Medications on Admission:  Current Outpatient Medications:  .  albuterol (VENTOLIN HFA) 108 (90 Base) MCG/ACT inhaler, Inhale 2 puffs into the lungs 4 (four) times daily as needed for wheezing or shortness of breath., Disp: , Rfl:  .  docusate sodium (COLACE) 100 MG capsule, Take 100 mg by mouth 2 (two) times daily., Disp: , Rfl:  .  Fluticasone-Salmeterol (ADVAIR) 250-50 MCG/DOSE AEPB, Inhale 1 puff into the lungs 2 (two) times daily., Disp: , Rfl:  .  ketotifen (ZADITOR) 0.025 % ophthalmic solution, 1 drop 2 (two) times daily., Disp: , Rfl:  .  losartan (COZAAR) 50 MG tablet, Take 50 mg by mouth daily., Disp: , Rfl:  .  omeprazole (PRILOSEC) 20 MG capsule, Take 20 mg by mouth daily., Disp: , Rfl:  .  pseudoephedrine-acetaminophen (TYLENOL SINUS) 30-500 MG TABS tablet, Take 1 tablet by mouth every 4 (four) hours as needed., Disp: , Rfl:  .  rosuvastatin (CRESTOR) 40 MG tablet, Take 40 mg by mouth daily., Disp: , Rfl:   Past Medical History: No past medical history on file.  Tobacco Use: Social History   Tobacco Use  Smoking Status Former Smoker  . Packs/day: 1.00  . Years: 20.00  . Pack years: 20.00  . Types: Cigarettes  . Quit date: 03/21/1978  . Years since quitting: 41.7  Smokeless Tobacco Never Used    Labs: Recent Review Flowsheet Data   There is no flowsheet data to display.      Pulmonary Assessment Scores:  Pulmonary Assessment Scores    Row Name 11/22/19 1517         ADL UCSD   SOB Score total 41      Rest 1     Walk 2     Stairs 4     Bath 2     Dress 0       CAT Score   CAT Score 16       mMRC Score   mMRC Score 1            UCSD: Self-administered rating of dyspnea associated with activities of daily living (ADLs) 6-point scale (0 = "not at all" to 5 = "maximal or unable to do because of breathlessness")  Scoring Scores range from 0 to 120.  Minimally important difference is 5 units  CAT: CAT can identify the health impairment of COPD patients and is better correlated with disease progression.  CAT has a scoring range of zero to 40. The CAT score is classified into four groups of low (less than 10), medium (10 - 20), high (21-30) and very high (31-40) based on the impact level of disease on health status. A CAT score over 10 suggests significant symptoms.  A worsening CAT score could be explained by an exacerbation, poor medication adherence, poor inhaler technique, or progression of COPD or comorbid conditions.  CAT MCID is 2 points  mMRC: mMRC (Modified Medical Research Council) Dyspnea Scale is used to assess the degree of baseline functional disability in patients of respiratory  disease due to dyspnea. No minimal important difference is established. A decrease in score of 1 point or greater is considered a positive change.   Pulmonary Function Assessment:  Pulmonary Function Assessment - 11/20/19 0837      Initial Spirometry Results   FVC% 81 %    FEV1% 62 %    FEV1/FVC Ratio 59    Comments No post bronchodialator results      Breath   Shortness of Breath Yes;Limiting activity           Exercise Target Goals: Exercise Program Goal: Individual exercise prescription set using results from initial 6 min walk test and THRR while considering  patient's activity barriers and safety.   Exercise Prescription Goal: Initial exercise prescription builds to 30-45 minutes a day of aerobic activity, 2-3 days per week.  Home exercise guidelines will be given to patient  during program as part of exercise prescription that the participant will acknowledge.  Education: Aerobic Exercise & Resistance Training: - Gives group verbal and written instruction on the various components of exercise. Focuses on aerobic and resistive training programs and the benefits of this training and how to safely progress through these programs..   Education: Exercise & Equipment Safety: - Individual verbal instruction and demonstration of equipment use and safety with use of the equipment.   Pulmonary Rehab from 11/28/2019 in [facility] Pul[facility] Re[facility]  Date 11/22/19  Educator AS  Instruction Review Code 1- Verbalizes Understanding      Education: Exercise Physiology & General Exercise Guidelines: - Group verbal and written instruction with models to review the exercise physiology of the cardiovascular system and associated critical values. Provides general exercise guidelines with specific guidelines to those with heart or lung disease.    Education: Flexibility, Balance, Mind/Body Relaxation: Provides group verbal/written instruction on the benefits of flexibility and balance training, including mind/body exercise modes such as yoga, pilates and tai chi.  Demonstration and skill practice provided.   Activity Barriers & Risk Stratification:   6 Minute Walk:  6 Minute Walk    Row Name 11/22/19 1507         6 Minute Walk   Phase Initial     Distance 1160 feet     Walk Time 6 minutes     # of Rest Breaks 0     MPH 2.2     METS 1.9     RPE 11     Perceived Dyspnea  1     VO2 Peak 6.7     Symptoms No     Resting HR 68 bpm     Resting BP 124/64     Resting Oxygen Saturation  94 %     Exercise Oxygen Saturation  during 6 min walk 88 %     Max Ex. HR 100 bpm     Max Ex. BP 134/64     2 Minute Post BP 108/66       Interval HR   1 Minute HR 86     2 Minute HR 86     3 Minute HR 97     4 Minute HR 100     5 Minute HR 96     6 Minute HR 99     2 Minute  Post HR 80     Interval Heart Rate? Yes       Interval Oxygen   Interval Oxygen? Yes     Baseline Oxygen Saturation % 94 %  1 Minute Oxygen Saturation % 91 %     1 Minute Liters of Oxygen 0 L     2 Minute Oxygen Saturation % 88 %     2 Minute Liters of Oxygen 0 L     3 Minute Oxygen Saturation % 89 %     3 Minute Liters of Oxygen 0 L     4 Minute Oxygen Saturation % 88 %     4 Minute Liters of Oxygen 0 L     5 Minute Oxygen Saturation % 89 %     5 Minute Liters of Oxygen 0 L     6 Minute Oxygen Saturation % 88 %     6 Minute Liters of Oxygen 0 L     2 Minute Post Oxygen Saturation % 93 %     2 Minute Post Liters of Oxygen 0 L           Oxygen Initial Assessment:  Oxygen Initial Assessment - 11/20/19 0836      Home Oxygen   Home Oxygen Device None    Sleep Oxygen Prescription None    Home Exercise Oxygen Prescription None    Home Resting Oxygen Prescription None    Compliance with Home Oxygen Use Yes      Initial 6 min Walk   Oxygen Used None      Program Oxygen Prescription   Program Oxygen Prescription None      Intervention   Short Term Goals To learn and understand importance of monitoring SPO2 with pulse oximeter and demonstrate accurate use of the pulse oximeter.;To learn and understand importance of maintaining oxygen saturations>88%;To learn and demonstrate proper pursed lip breathing techniques or other breathing techniques.;To learn and demonstrate proper use of respiratory medications    Long  Term Goals Verbalizes importance of monitoring SPO2 with pulse oximeter and return demonstration;Maintenance of O2 saturations>88%;Exhibits proper breathing techniques, such as pursed lip breathing or other method taught during program session;Compliance with respiratory medication;Demonstrates proper use of MDI's           Oxygen Re-Evaluation:  Oxygen Re-Evaluation    Row Name 11/26/19 1536             Program Oxygen Prescription   Program Oxygen  Prescription None         Home Oxygen   Home Oxygen Device None       Sleep Oxygen Prescription None       Home Exercise Oxygen Prescription None       Home Resting Oxygen Prescription None       Compliance with Home Oxygen Use Yes         Goals/Expected Outcomes   Short Term Goals To learn and understand importance of monitoring SPO2 with pulse oximeter and demonstrate accurate use of the pulse oximeter.;To learn and understand importance of maintaining oxygen saturations>88%;To learn and demonstrate proper pursed lip breathing techniques or other breathing techniques.;To learn and demonstrate proper use of respiratory medications       Long  Term Goals Verbalizes importance of monitoring SPO2 with pulse oximeter and return demonstration;Maintenance of O2 saturations>88%;Exhibits proper breathing techniques, such as pursed lip breathing or other method taught during program session;Compliance with respiratory medication;Demonstrates proper use of MDI's       Comments Reviewed PLB technique with pt.  Talked about how it works and it's importance in maintaining their exercise saturations.       Goals/Expected Outcomes Short: Become more profiecient at using PLB.  Long: Become independent at using PLB.              Oxygen Discharge (Final Oxygen Re-Evaluation):  Oxygen Re-Evaluation - 11/26/19 1536      Program Oxygen Prescription   Program Oxygen Prescription None      Home Oxygen   Home Oxygen Device None    Sleep Oxygen Prescription None    Home Exercise Oxygen Prescription None    Home Resting Oxygen Prescription None    Compliance with Home Oxygen Use Yes      Goals/Expected Outcomes   Short Term Goals To learn and understand importance of monitoring SPO2 with pulse oximeter and demonstrate accurate use of the pulse oximeter.;To learn and understand importance of maintaining oxygen saturations>88%;To learn and demonstrate proper pursed lip breathing techniques or other breathing  techniques.;To learn and demonstrate proper use of respiratory medications    Long  Term Goals Verbalizes importance of monitoring SPO2 with pulse oximeter and return demonstration;Maintenance of O2 saturations>88%;Exhibits proper breathing techniques, such as pursed lip breathing or other method taught during program session;Compliance with respiratory medication;Demonstrates proper use of MDI's    Comments Reviewed PLB technique with pt.  Talked about how it works and it's importance in maintaining their exercise saturations.    Goals/Expected Outcomes Short: Become more profiecient at using PLB.   Long: Become independent at using PLB.           Initial Exercise Prescription:  Initial Exercise Prescription - 11/22/19 1500      Date of Initial Exercise RX and Referring Provider   Date 11/22/19    Referring Provider [name]      Treadmill   MPH 1.5    Grade 0.5    Minutes 15    METs 2      Recumbant Bike   Level 1    RPM 60    Minutes 15    METs 2      NuStep   Level 2    SPM 80    Minutes 15    METs 2      T5 Nustep   Level 1    SPM 80    Minutes 15    METs 2      Prescription Details   Frequency (times per week) 3    Duration Progress to 30 minutes of continuous aerobic without signs/symptoms of physical distress      Intensity   THRR 40-80% of Max Heartrate 97-126    Ratings of Perceived Exertion 11-15    Perceived Dyspnea 0-4      Progression   Progression Continue to progress workloads to maintain intensity without signs/symptoms of physical distress.      Resistance Training   Training Prescription Yes    Weight 3 lb    Reps 10-15           Perform Capillary Blood Glucose checks as needed.  Exercise Prescription Changes:  Exercise Prescription Changes    Row Name 11/22/19 1500 12/04/19 1300           Response to Exercise   Blood Pressure (Admit) 124/64 118/60      Blood Pressure (Exercise) 134/64 156/64      Blood Pressure (Exit)  108/66 122/60      Heart Rate (Admit) 68 bpm 67 bpm      Heart Rate (Exercise) 100 bpm 89 bpm      Heart Rate (Exit) 80 bpm 74 bpm      Oxygen  Saturation (Admit) 94 % 91 %      Oxygen Saturation (Exercise) 88 % 94 %      Oxygen Saturation (Exit) 93 % 93 %      Rating of Perceived Exertion (Exercise) 11 12      Perceived Dyspnea (Exercise) 1 1      Symptoms none none      Duration - Continue with 30 min of aerobic exercise without signs/symptoms of physical distress.      Intensity - THRR unchanged        Progression   Progression - Continue to progress workloads to maintain intensity without signs/symptoms of physical distress.      Average METs - 1.96        Resistance Training   Training Prescription - Yes      Weight - 3 lb      Reps - 10-15        Interval Training   Interval Training - No        Treadmill   MPH - 1      Grade - 0      Minutes - 15      METs - 1.77        Recumbant Bike   Level - 1      Minutes - 15      METs - 2.2        NuStep   Level - 1      Minutes - 15      METs - 1.9        T5 Nustep   Level - 1      Minutes - 15      METs - 2             Exercise Comments:   Exercise Goals and Review:  Exercise Goals    Row Name 11/22/19 1514             Exercise Goals   Increase Physical Activity Yes       Intervention Provide advice, education, support and counseling about physical activity/exercise needs.;Develop an individualized exercise prescription for aerobic and resistive training based on initial evaluation findings, risk stratification, comorbidities and participant's personal goals.       Expected Outcomes Short Term: Attend rehab on a regular basis to increase amount of physical activity.;Long Term: Add in home exercise to make exercise part of routine and to increase amount of physical activity.;Long Term: Exercising regularly at least 3-5 days a week.       Increase Strength and Stamina Yes       Intervention Provide advice,  education, support and counseling about physical activity/exercise needs.;Develop an individualized exercise prescription for aerobic and resistive training based on initial evaluation findings, risk stratification, comorbidities and participant's personal goals.       Expected Outcomes Short Term: Increase workloads from initial exercise prescription for resistance, speed, and METs.;Short Term: Perform resistance training exercises routinely during rehab and add in resistance training at home;Long Term: Improve cardiorespiratory fitness, muscular endurance and strength as measured by increased METs and functional capacity (6MWT)       Able to understand and use rate of perceived exertion (RPE) scale Yes       Intervention Provide education and explanation on how to use RPE scale       Expected Outcomes Short Term: Able to use RPE daily in rehab to express subjective intensity level;Long Term:  Able to use RPE to guide intensity  level when exercising independently       Able to understand and use Dyspnea scale Yes       Intervention Provide education and explanation on how to use Dyspnea scale       Expected Outcomes Short Term: Able to use Dyspnea scale daily in rehab to express subjective sense of shortness of breath during exertion;Long Term: Able to use Dyspnea scale to guide intensity level when exercising independently       Knowledge and understanding of Target Heart Rate Range (THRR) Yes       Intervention Provide education and explanation of THRR including how the numbers were predicted and where they are located for reference       Expected Outcomes Short Term: Able to state/look up THRR;Short Term: Able to use daily as guideline for intensity in rehab;Long Term: Able to use THRR to govern intensity when exercising independently       Able to check pulse independently Yes       Intervention Provide education and demonstration on how to check pulse in carotid and radial arteries.;Review the  importance of being able to check your own pulse for safety during independent exercise       Expected Outcomes Short Term: Able to explain why pulse checking is important during independent exercise;Long Term: Able to check pulse independently and accurately       Understanding of Exercise Prescription Yes              Exercise Goals Re-Evaluation :  Exercise Goals Re-Evaluation    Row Name 11/26/19 1535 12/04/19 1305           Exercise Goal Re-Evaluation   Exercise Goals Review Increase Physical Activity;Able to understand and use rate of perceived exertion (RPE) scale;Knowledge and understanding of Target Heart Rate Range (THRR);Understanding of Exercise Prescription;Increase Strength and Stamina;Able to understand and use Dyspnea scale;Able to check pulse independently Increase Physical Activity;Increase Strength and Stamina;Understanding of Exercise Prescription      Comments Reviewed RPE and dyspnea scales, THR and program prescription with pt today.  Pt voiced understanding and was given a copy of goals to take home. Lance is off to a good start in rehab.  He has completed his first four full days of exercise already.  He is already up to 2 METs on the T5 NuStep.  We will continue to monitor his progress.      Expected Outcomes Short: Use RPE daily to regulate intensity. Long: Follow program prescription in THR. Short: Continue to attend rehab regularly  Long: Continue to follow program prescription             Discharge Exercise Prescription (Final Exercise Prescription Changes):  Exercise Prescription Changes - 12/04/19 1300      Response to Exercise   Blood Pressure (Admit) 118/60    Blood Pressure (Exercise) 156/64    Blood Pressure (Exit) 122/60    Heart Rate (Admit) 67 bpm    Heart Rate (Exercise) 89 bpm    Heart Rate (Exit) 74 bpm    Oxygen Saturation (Admit) 91 %    Oxygen Saturation (Exercise) 94 %    Oxygen Saturation (Exit) 93 %    Rating of Perceived Exertion  (Exercise) 12    Perceived Dyspnea (Exercise) 1    Symptoms none    Duration Continue with 30 min of aerobic exercise without signs/symptoms of physical distress.    Intensity THRR unchanged      Progression   Progression Continue  to progress workloads to maintain intensity without signs/symptoms of physical distress.    Average METs 1.96      Resistance Training   Training Prescription Yes    Weight 3 lb    Reps 10-15      Interval Training   Interval Training No      Treadmill   MPH 1    Grade 0    Minutes 15    METs 1.77      Recumbant Bike   Level 1    Minutes 15    METs 2.2      NuStep   Level 1    Minutes 15    METs 1.9      T5 Nustep   Level 1    Minutes 15    METs 2           Nutrition:  Target Goals: Understanding of nutrition guidelines, daily intake of sodium <1527m, cholesterol <2043m calories 30% from fat and 7% or less from saturated fats, daily to have 5 or more servings of fruits and vegetables.  Education: Controlling Sodium/Reading Food Labels -Group verbal and written material supporting the discussion of sodium use in heart healthy nutrition. Review and explanation with models, verbal and written materials for utilization of the food label.   Education: General Nutrition Guidelines/Fats and Fiber: -Group instruction provided by verbal, written material, models and posters to present the general guidelines for heart healthy nutrition. Gives an explanation and review of dietary fats and fiber.   Biometrics:  Pre Biometrics - 11/22/19 1514      Pre Biometrics   Height 5' 4.5" (1.638 m)    Weight 182 lb 9.6 oz (82.8 kg)    BMI (Calculated) 30.87    Single Leg Stand 22.35 seconds            Nutrition Therapy Plan and Nutrition Goals:   Nutrition Assessments:  Nutrition Assessments - 11/22/19 1516      Rate Your Plate Scores   Pre Score 28           MEDIFICTS Score Key:          ?70 Need to make dietary changes           40-70 Heart Healthy Diet         ? 40 Therapeutic Level Cholesterol Diet  Nutrition Goals Re-Evaluation:   Nutrition Goals Discharge (Final Nutrition Goals Re-Evaluation):   Psychosocial: Target Goals: Acknowledge presence or absence of significant depression and/or stress, maximize coping skills, provide positive support system. Participant is able to verbalize types and ability to use techniques and skills needed for reducing stress and depression.   Education: Depression - Provides group verbal and written instruction on the correlation between heart/lung disease and depressed mood, treatment options, and the stigmas associated with seeking treatment.   Education: Sleep Hygiene -Provides group verbal and written instruction about how sleep can affect your health.  Define sleep hygiene, discuss sleep cycles and impact of sleep habits. Review good sleep hygiene tips.    Education: Stress and Anxiety: - Provides group verbal and written instruction about the health risks of elevated stress and causes of high stress.  Discuss the correlation between heart/lung disease and anxiety and treatment options. Review healthy ways to manage with stress and anxiety.   Initial Review & Psychosocial Screening:  Initial Psych Review & Screening - 11/20/19 0840      Initial Review   Current issues with None Identified  Family Dynamics   Good Support System? Yes    Comments He can look to his wife and son for support. He is willing to stay healthy and has a positive attitude.      Barriers   Psychosocial barriers to participate in program There are no identifiable barriers or psychosocial needs.;The patient should benefit from training in stress management and relaxation.      Screening Interventions   Interventions Encouraged to exercise;To provide support and resources with identified psychosocial needs;Provide feedback about the scores to participant    Expected Outcomes Short Term  goal: Utilizing psychosocial counselor, staff and physician to assist with identification of specific Stressors or current issues interfering with healing process. Setting desired goal for each stressor or current issue identified.;Long Term Goal: Stressors or current issues are controlled or eliminated.;Short Term goal: Identification and review with participant of any Quality of Life or Depression concerns found by scoring the questionnaire.;Long Term goal: The participant improves quality of Life and PHQ9 Scores as seen by post scores and/or verbalization of changes           Quality of Life Scores:  Scores of 19 and below usually indicate a poorer quality of life in these areas.  A difference of  2-3 points is a clinically meaningful difference.  A difference of 2-3 points in the total score of the Quality of Life Index has been associated with significant improvement in overall quality of life, self-image, physical symptoms, and general health in studies assessing change in quality of life.  PHQ-9: Recent Review Flowsheet Data    Depression screen [facility] 2/9 11/22/2019   Decreased Interest 0   Down, Depressed, Hopeless 0   PHQ - 2 Score 0   Altered sleeping 2    Tired, decreased energy 1   Change in appetite 2   Feeling bad or failure about yourself  0   Trouble concentrating 0   Moving slowly or fidgety/restless 0   Suicidal thoughts 0   PHQ-9 Score 5   Difficult doing work/chores Not difficult at all     Interpretation of Total Score  Total Score Depression Severity:  1-4 = Minimal depression, 5-9 = Mild depression, 10-14 = Moderate depression, 15-19 = Moderately severe depression, 20-27 = Severe depression   Psychosocial Evaluation and Intervention:  Psychosocial Evaluation - 11/20/19 0842      Psychosocial Evaluation & Interventions   Interventions Encouraged to exercise with the program and follow exercise prescription    Comments He can look to his wife and son for support.  He is willing to stay healthy and has a positive attitude.    Expected Outcomes Short: Exercise regularly to support mental health and notify staff of any changes. Long: maintain mental health and well being through teaching of rehab or prescribed medications independently.    Continue Psychosocial Services  Follow up required by staff           Psychosocial Re-Evaluation:   Psychosocial Discharge (Final Psychosocial Re-Evaluation):   Education: Education Goals: Education classes will be provided on a weekly basis, covering required topics. Participant will state understanding/return demonstration of topics presented.  Learning Barriers/Preferences:  Learning Barriers/Preferences - 11/20/19 0837      Learning Barriers/Preferences   Learning Barriers None    Learning Preferences None           General Pulmonary Education Topics:  Infection Prevention: - Provides verbal and written material to individual with discussion of infection control including proper hand washing  and proper equipment cleaning during exercise session.   Pulmonary Rehab from 11/28/2019 in [facility] Cardiac and Pulmonary Rehab  Date 11/22/19  Educator AS  Instruction Review Code 1- Verbalizes Understanding      Falls Prevention: - Provides verbal and written material to individual with discussion of falls prevention and safety.   Pulmonary Rehab from 11/28/2019 in [facility] Cardiac and Pulmonary Rehab  Date 11/22/19  Educator AS  Instruction Review Code 1- Verbalizes Understanding      Chronic Lung Diseases: - Group verbal and written instruction to review updates, respiratory medications, advancements in procedures and treatments. Discuss use of supplemental oxygen including available portable oxygen systems, continuous and intermittent flow rates, concentrators, personal use and safety guidelines. Review proper use of inhaler and spacers. Provide informative websites for self-education.    Pulmonary Rehab  from 11/28/2019 in [facility] Cardiac and Pulmonary Rehab  Date 11/28/19  Educator [facility]  Instruction Review Code 1- Verbalizes Understanding      Energy Conservation: - Provide group verbal and written instruction for methods to conserve energy, plan and organize activities. Instruct on pacing techniques, use of adaptive equipment and posture/positioning to relieve shortness of breath.   Pulmonary Rehab from 11/28/2019 in [facility] Cardiac and Pulmonary Rehab  Date 11/28/19  Educator [facility]  Instruction Review Code 1- Verbalizes Understanding      Triggers and Exacerbations: - Group verbal and written instruction to review types of environmental triggers and ways to prevent exacerbations. Discuss weather changes, air quality and the benefits of nasal washing. Review warning signs and symptoms to help prevent infections. Discuss techniques for effective airway clearance, coughing, and vibrations.   Pulmonary Rehab from 11/28/2019 in [facility] Cardiac and Pulmonary Rehab  Date 11/28/19  Educator [facility]  Instruction Review Code 1- Verbalizes Understanding      AED/CPR: - Group verbal and written instruction with the use of models to demonstrate the basic use of the AED with the basic ABC's of resuscitation.   Anatomy and Physiology of the Lungs: - Group verbal and written instruction with the use of models to provide basic lung anatomy and physiology related to function, structure and complications of lung disease.   Pulmonary Rehab from 11/28/2019 in [facility] Cardiac and Pulmonary Rehab  Date 11/28/19  Educator [facility]  Instruction Review Code 1- Verbalizes Understanding      Anatomy & Physiology of the Heart: - Group verbal and written instruction and models provide basic cardiac anatomy and physiology, with the coronary electrical and arterial systems. Review of Valvular disease and Heart Failure   Cardiac Medications: - Group verbal and written instruction to review commonly prescribed medications for heart  disease. Reviews the medication, class of the drug, and side effects.   Other: -Provides group and verbal instruction on various topics (see comments)   Knowledge Questionnaire Score:  Knowledge Questionnaire Score - 11/22/19 1516      Knowledge Questionnaire Score   Pre Score 15/18            Core Components/Risk Factors/Patient Goals at Admission:  Personal Goals and Risk Factors at Admission - 11/22/19 1515      Core Components/Risk Factors/Patient Goals on Admission    Weight Management Yes;Weight Loss    Intervention Weight Management: Provide education and appropriate resources to help participant work on and attain dietary goals.;Weight Management: Develop a combined nutrition and exercise program designed to reach desired caloric intake, while maintaining appropriate intake of nutrient and fiber, sodium and fats, and appropriate energy expenditure required for the  weight goal.;Weight Management/Obesity: Establish reasonable short term and long term weight goals.    Admit Weight 182 lb 9.6 oz (82.8 kg)    Goal Weight: Short Term 175 lb (79.4 kg)    Goal Weight: Long Term 170 lb (77.1 kg)    Expected Outcomes Short Term: Continue to assess and modify interventions until short term weight is achieved;Long Term: Adherence to nutrition and physical activity/exercise program aimed toward attainment of established weight goal;Weight Maintenance: Understanding of the daily nutrition guidelines, which includes 25-35% calories from fat, 7% or less cal from saturated fats, less than 268m cholesterol, less than 1.5gm of sodium, & 5 or more servings of fruits and vegetables daily;Weight Loss: Understanding of general recommendations for a balanced deficit meal plan, which promotes 1-2 lb weight loss per week and includes a negative energy balance of [phone] kcal/d;Understanding recommendations for meals to include 15-35% energy as protein, 25-35% energy from fat, 35-60% energy from  carbohydrates, less than 202mof dietary cholesterol, 20-35 gm of total fiber daily;Understanding of distribution of calorie intake throughout the day with the consumption of 4-5 meals/snacks    Improve shortness of breath with ADL's Yes    Intervention Provide education, individualized exercise plan and daily activity instruction to help decrease symptoms of SOB with activities of daily living.    Expected Outcomes Short Term: Improve cardiorespiratory fitness to achieve a reduction of symptoms when performing ADLs;Long Term: Be able to perform more ADLs without symptoms or delay the onset of symptoms    Hypertension Yes    Intervention Provide education on lifestyle modifcations including regular physical activity/exercise, weight management, moderate sodium restriction and increased consumption of fresh fruit, vegetables, and low fat dairy, alcohol moderation, and smoking cessation.;Monitor prescription use compliance.    Expected Outcomes Short Term: Continued assessment and intervention until BP is < 140/9059mG in hypertensive participants. < 130/7m30m in hypertensive participants with diabetes, heart failure or chronic kidney disease.;Long Term: Maintenance of blood pressure at goal levels.    Lipids Yes    Intervention Provide education and support for participant on nutrition & aerobic/resistive exercise along with prescribed medications to achieve LDL <70mg21mL >40mg.23mExpected Outcomes Short Term: Participant states understanding of desired cholesterol values and is compliant with medications prescribed. Participant is following exercise prescription and nutrition guidelines.;Long Term: Cholesterol controlled with medications as prescribed, with individualized exercise RX and with personalized nutrition plan. Value goals: LDL < 70mg, 65m> 40 mg.           Education:Diabetes - Individual verbal and written instruction to review signs/symptoms of diabetes, desired ranges of glucose  level fasting, after meals and with exercise. Acknowledge that pre and post exercise glucose checks will be done for 3 sessions at entry of program.   Education: Know Your Numbers and Risk Factors: -Group verbal and written instruction about important numbers in your health.  Discussion of what are risk factors and how they play a role in the disease process.  Review of Cholesterol, Blood Pressure, Diabetes, and BMI and the role they play in your overall health.   Core Components/Risk Factors/Patient Goals Review:    Core Components/Risk Factors/Patient Goals at Discharge (Final Review):    ITP Comments:  ITP Comments    Row Name 11/20/19 0844 11/26/19 1534 12/05/19 0505       ITP Comments Virtual Visit completed. Patient informed on EP and RD appointment and 6 Minute walk test. Patient also informed of patient health questionnaires on  My Chart. Patient Verbalizes understanding. Visit diagnosis can be found in Media, patient is [place]. First full day of exercise!  Patient was oriented to gym and equipment including functions, settings, policies, and procedures.  Patient's individual exercise prescription and treatment plan were reviewed.  All starting workloads were established based on the results of the 6 minute walk test done at initial orientation visit.  The plan for exercise progression was also introduced and progression will be customized based on patient's performance and goals. 30 Day review completed. Medical Director ITP review done, changes made as directed, and signed approval by Medical Director.            Comments:

## 2019-12-17 ENCOUNTER — Encounter: Payer: No Typology Code available for payment source | Admitting: *Deleted

## 2019-12-17 ENCOUNTER — Other Ambulatory Visit: Payer: Self-pay

## 2019-12-17 DIAGNOSIS — J449 Chronic obstructive pulmonary disease, unspecified: Secondary | ICD-10-CM

## 2019-12-17 NOTE — Progress Notes (Signed)
Daily Session Note  Patient Details  Name: Phillip Chavez MRN: [id number] Date of Birth: [birth year] Referring Provider:     Pulmonary Rehab from 11/22/2019 in [facility] Cardiac and Pulmonary Rehab  Referring Provider [name]      Encounter Date: 12/17/2019  Check In:  Session Check In - 12/17/19 1548      Check-In   Supervising physician immediately available to respond to emergencies See telemetry face sheet for immediately available ER MD    Location [facility]-Cardiac & Pulmonary Rehab    Staff Present Renita Papa, RN Margurite Auerbach, MS Exercise Physiologist;Kelly Amedeo Plenty, BS, ACSM CEP, Exercise Physiologist;Other   Birdie Sons, RN   Virtual Visit No    Medication changes reported     No    Fall or balance concerns reported    No    Warm-up and Cool-down Performed on first and last piece of equipment    Resistance Training Performed Yes    VAD Patient? No    PAD/SET Patient? No      Pain Assessment   Currently in Pain? No/denies              Social History   Tobacco Use  Smoking Status Former Smoker  . Packs/day: 1.00  . Years: 20.00  . Pack years: 20.00  . Types: Cigarettes  . Quit date: 03/21/1978  . Years since quitting: 41.7  Smokeless Tobacco Never Used    Goals Met:  Independence with exercise equipment Exercise tolerated well No report of cardiac concerns or symptoms Strength training completed today  Goals Unmet:  Not Applicable  Comments: Pt able to follow exercise prescription today without complaint.  Will continue to monitor for progression.    Dr. Emily Filbert is Medical Director for [place] and [facility].

## 2019-12-19 ENCOUNTER — Other Ambulatory Visit: Payer: Self-pay

## 2019-12-19 DIAGNOSIS — J449 Chronic obstructive pulmonary disease, unspecified: Secondary | ICD-10-CM | POA: Diagnosis not present

## 2019-12-19 NOTE — Progress Notes (Signed)
Daily Session Note  Patient Details  Name: DAVARIOUS TUMBLESON MRN: [id number] Date of Birth: December 06, 1940 Referring Provider:     Pulmonary Rehab from 11/22/2019 in [facility] Cardiac and Pulmonary Rehab  Referring Provider [name]      Encounter Date: 12/19/2019  Check In:  Session Check In - 12/19/19 1541      Check-In   Supervising physician immediately available to respond to emergencies See telemetry face sheet for immediately available ER MD    Location [facility]-Cardiac & Pulmonary Rehab    Staff Present Birdie Sons, MPA, RN;Meredith Sherryll Burger, RN BSN;Joseph Lou Miner, [place] Exercise Physiologist    Virtual Visit No    Medication changes reported     No    Fall or balance concerns reported    No    Warm-up and Cool-down Performed on first and last piece of equipment    Resistance Training Performed Yes    VAD Patient? No    PAD/SET Patient? No      Pain Assessment   Currently in Pain? No/denies              Social History   Tobacco Use  Smoking Status Former Smoker  . Packs/day: 1.00  . Years: 20.00  . Pack years: 20.00  . Types: Cigarettes  . Quit date: 03/21/1978  . Years since quitting: 41.7  Smokeless Tobacco Never Used    Goals Met:  Independence with exercise equipment Exercise tolerated well No report of cardiac concerns or symptoms Strength training completed today  Goals Unmet:  Not Applicable  Comments: Pt able to follow exercise prescription today without complaint.  Will continue to monitor for progression.   Dr. Emily Filbert is Medical Director for [place] and [facility].

## 2019-12-20 ENCOUNTER — Encounter: Payer: No Typology Code available for payment source | Admitting: *Deleted

## 2019-12-20 ENCOUNTER — Other Ambulatory Visit: Payer: Self-pay

## 2019-12-20 DIAGNOSIS — J449 Chronic obstructive pulmonary disease, unspecified: Secondary | ICD-10-CM

## 2019-12-20 NOTE — Progress Notes (Signed)
Daily Session Note  Patient Details  Name: Phillip Chavez MRN: [id number] Date of Birth: January 08, 1941 Referring Provider:     Pulmonary Rehab from 11/22/2019 in [facility] Cardiac and Pulmonary Rehab  Referring Provider Phillip Chavez      Encounter Date: 12/20/2019  Check In:  Session Check In - 12/20/19 [name]      Check-In   Supervising physician immediately available to respond to emergencies See telemetry face sheet for immediately available ER MD    Location [facility]-Cardiac & Pulmonary Rehab    Staff Present Phillip Papa, RN BSN;Phillip Chavez, [place], [place], CCRP, CCET    Virtual Visit No    Medication changes reported     No    Fall or balance concerns reported    No    Warm-up and Cool-down Performed on first and last piece of equipment    Resistance Training Performed Yes    VAD Patient? No    PAD/SET Patient? No      Pain Assessment   Currently in Pain? No/denies              Social History   Tobacco Use  Smoking Status Former Smoker  . Packs/day: 1.00  . Years: 20.00  . Pack years: 20.00  . Types: Cigarettes  . Quit date: 03/21/1978  . Years since quitting: 41.7  Smokeless Tobacco Never Used    Goals Met:  Independence with exercise equipment Exercise tolerated well No report of cardiac concerns or symptoms Strength training completed today  Goals Unmet:  Not Applicable  Comments: Pt able to follow exercise prescription today without complaint.  Will continue to monitor for progression.    Dr. Emily Chavez is Medical Director for [place] and [facility].

## 2019-12-24 ENCOUNTER — Other Ambulatory Visit: Payer: Self-pay

## 2019-12-24 DIAGNOSIS — J449 Chronic obstructive pulmonary disease, unspecified: Secondary | ICD-10-CM

## 2019-12-24 NOTE — Progress Notes (Signed)
Daily Session Note  Patient Details  Name: Phillip Chavez MRN: [id number] Date of Birth: [birth year] Referring Provider:     Pulmonary Rehab from 11/22/2019 in [facility] Cardiac and Pulmonary Rehab  Referring Provider [name]      Encounter Date: 12/24/2019  Check In:  Session Check In - 12/24/19 1532      Check-In   Supervising physician immediately available to respond to emergencies See telemetry face sheet for immediately available ER MD    Location [facility]-Cardiac & Pulmonary Rehab    Staff Present Birdie Sons, MPA, [place]icia [place], BS, ACSM CEP, Exercise Physiologist;Meredith Sherryll Burger, RN Margurite Auerbach, MS Exercise Physiologist    Virtual Visit No    Medication changes reported     No    Fall or balance concerns reported    No    Warm-up and Cool-down Performed on first and last piece of equipment    Resistance Training Performed Yes    VAD Patient? No    PAD/SET Patient? No      Pain Assessment   Currently in Pain? No/denies              Social History   Tobacco Use  Smoking Status Former Smoker  . Packs/day: 1.00  . Years: 20.00  . Pack years: 20.00  . Types: Cigarettes  . Quit date: 03/21/1978  . Years since quitting: 41.7  Smokeless Tobacco Never Used    Goals Met:  Independence with exercise equipment Exercise tolerated well No report of cardiac concerns or symptoms Strength training completed today  Goals Unmet:  Not Applicable  Comments: Pt able to follow exercise prescription today without complaint.  Will continue to monitor for progression.   Dr. Emily Filbert is Medical Director for [place] and [facility].

## 2019-12-26 ENCOUNTER — Other Ambulatory Visit: Payer: Self-pay

## 2019-12-26 DIAGNOSIS — J449 Chronic obstructive pulmonary disease, unspecified: Secondary | ICD-10-CM | POA: Diagnosis not present

## 2019-12-26 NOTE — Progress Notes (Signed)
Daily Session Note  Patient Details  Name: Phillip Chavez MRN: [id number] Date of Birth: 06-24-1940 Referring Provider:     Pulmonary Rehab from 11/22/2019 in [facility] Cardiac and Pulmonary Rehab  Referring Provider [name]      Encounter Date: 12/26/2019  Check In:  Session Check In - 12/26/19 [place]      Check-In   Supervising physician immediately available to respond to emergencies See telemetry face sheet for immediately available ER MD    Location [facility]-Cardiac & Pulmonary Rehab    Staff Present Birdie Sons, MPA, RN;Joseph Lou Miner, [place] Exercise Physiologist    Virtual Visit No    Medication changes reported     No    Fall or balance concerns reported    No    Warm-up and Cool-down Performed on first and last piece of equipment    Resistance Training Performed Yes    VAD Patient? No    PAD/SET Patient? No      Pain Assessment   Currently in Pain? No/denies              Social History   Tobacco Use  Smoking Status Former Smoker  . Packs/day: 1.00  . Years: 20.00  . Pack years: 20.00  . Types: Cigarettes  . Quit date: 03/21/1978  . Years since quitting: 41.7  Smokeless Tobacco Never Used    Goals Met:  Independence with exercise equipment Exercise tolerated well No report of cardiac concerns or symptoms Strength training completed today  Goals Unmet:  Not Applicable  Comments: Pt able to follow exercise prescription today without complaint.  Will continue to monitor for progression.    Dr. Emily Filbert is Medical Director for [place] and [facility].

## 2019-12-27 ENCOUNTER — Other Ambulatory Visit: Payer: Self-pay

## 2019-12-27 ENCOUNTER — Encounter: Payer: No Typology Code available for payment source | Admitting: *Deleted

## 2019-12-27 DIAGNOSIS — J449 Chronic obstructive pulmonary disease, unspecified: Secondary | ICD-10-CM | POA: Diagnosis not present

## 2019-12-27 NOTE — Progress Notes (Signed)
Daily Session Note  Patient Details  Name: CAMILO MANDER MRN: [id number] Date of Birth: June 27, 1940 Referring Provider:     Pulmonary Rehab from 11/22/2019 in [facility] Cardiac and Pulmonary Rehab  Referring Provider [name]      Encounter Date: 12/27/2019  Check In:  Session Check In - 12/27/19 1531      Check-In   Supervising physician immediately available to respond to emergencies See telemetry face sheet for immediately available ER MD    Location [facility]-Cardiac & Pulmonary Rehab    Staff Present Renita Papa, RN BSN;Joseph [address] [place], [place], [place], CCRP, CCET    Virtual Visit No    Medication changes reported     No    Fall or balance concerns reported    No    Warm-up and Cool-down Performed on first and last piece of equipment    Resistance Training Performed Yes    VAD Patient? No    PAD/SET Patient? No      Pain Assessment   Currently in Pain? No/denies              Social History   Tobacco Use  Smoking Status Former Smoker  . Packs/day: 1.00  . Years: 20.00  . Pack years: 20.00  . Types: Cigarettes  . Quit date: 03/21/1978  . Years since quitting: 41.7  Smokeless Tobacco Never Used    Goals Met:  Independence with exercise equipment Exercise tolerated well No report of cardiac concerns or symptoms Strength training completed today  Goals Unmet:  Not Applicable  Comments: Pt able to follow exercise prescription today without complaint.  Will continue to monitor for progression.    Dr. Emily Filbert is Medical Director for [place] and [facility].

## 2020-01-02 ENCOUNTER — Other Ambulatory Visit: Payer: Self-pay

## 2020-01-02 ENCOUNTER — Encounter: Payer: No Typology Code available for payment source | Attending: Internal Medicine

## 2020-01-02 ENCOUNTER — Encounter: Payer: Self-pay | Admitting: *Deleted

## 2020-01-02 DIAGNOSIS — J449 Chronic obstructive pulmonary disease, unspecified: Secondary | ICD-10-CM | POA: Diagnosis present

## 2020-01-02 NOTE — Progress Notes (Signed)
Pulmonary Individual Treatment Plan  Patient Details  Name: Phillip Chavez MRN: [id number] Date of Birth: 24-Jul-1940 Referring Provider:     Pulmonary Rehab from 11/22/2019 in [facility] Cardiac and Pulmonary Rehab  Referring Provider [name]      Initial Encounter Date:    Pulmonary Rehab from 11/22/2019 in [facility] Cardiac and Pulmonary Rehab  Date 11/22/19      Visit Diagnosis: Chronic obstructive pulmonary disease, unspecified COPD type ([place])  Patient's Home Medications on Admission:  Current Outpatient Medications:  .  albuterol (VENTOLIN HFA) 108 (90 Base) MCG/ACT inhaler, Inhale 2 puffs into the lungs 4 (four) times daily as needed for wheezing or shortness of breath., Disp: , Rfl:  .  docusate sodium (COLACE) 100 MG capsule, Take 100 mg by mouth 2 (two) times daily., Disp: , Rfl:  .  Fluticasone-Salmeterol (ADVAIR) 250-50 MCG/DOSE AEPB, Inhale 1 puff into the lungs 2 (two) times daily., Disp: , Rfl:  .  ketotifen (ZADITOR) 0.025 % ophthalmic solution, 1 drop 2 (two) times daily., Disp: , Rfl:  .  losartan (COZAAR) 50 MG tablet, Take 50 mg by mouth daily., Disp: , Rfl:  .  omeprazole (PRILOSEC) 20 MG capsule, Take 20 mg by mouth daily., Disp: , Rfl:  .  pseudoephedrine-acetaminophen (TYLENOL SINUS) 30-500 MG TABS tablet, Take 1 tablet by mouth every 4 (four) hours as needed., Disp: , Rfl:  .  rosuvastatin (CRESTOR) 40 MG tablet, Take 40 mg by mouth daily., Disp: , Rfl:   Past Medical History: No past medical history on file.  Tobacco Use: Social History   Tobacco Use  Smoking Status Former Smoker  . Packs/day: 1.00  . Years: 20.00  . Pack years: 20.00  . Types: Cigarettes  . Quit date: 03/21/1978  . Years since quitting: 41.8  Smokeless Tobacco Never Used    Labs: Recent Review Flowsheet Data   There is no flowsheet data to display.      Pulmonary Assessment Scores:  Pulmonary Assessment Scores    Row Name 11/22/19 1517         ADL UCSD   SOB Score total 41      Rest 1     Walk 2     Stairs 4     Bath 2     Dress 0       CAT Score   CAT Score 16       mMRC Score   mMRC Score 1            UCSD: Self-administered rating of dyspnea associated with activities of daily living (ADLs) 6-point scale (0 = "not at all" to 5 = "maximal or unable to do because of breathlessness")  Scoring Scores range from 0 to 120.  Minimally important difference is 5 units  CAT: CAT can identify the health impairment of COPD patients and is better correlated with disease progression.  CAT has a scoring range of zero to 40. The CAT score is classified into four groups of low (less than 10), medium (10 - 20), high (21-30) and very high (31-40) based on the impact level of disease on health status. A CAT score over 10 suggests significant symptoms.  A worsening CAT score could be explained by an exacerbation, poor medication adherence, poor inhaler technique, or progression of COPD or comorbid conditions.  CAT MCID is 2 points  mMRC: mMRC (Modified Medical Research Council) Dyspnea Scale is used to assess the degree of baseline functional disability in patients of respiratory  disease due to dyspnea. No minimal important difference is established. A decrease in score of 1 point or greater is considered a positive change.   Pulmonary Function Assessment:  Pulmonary Function Assessment - 11/20/19 0837      Initial Spirometry Results   FVC% 81 %    FEV1% 62 %    FEV1/FVC Ratio 59    Comments No post bronchodialator results      Breath   Shortness of Breath Yes;Limiting activity           Exercise Target Goals: Exercise Program Goal: Individual exercise prescription set using results from initial 6 min walk test and THRR while considering  patient's activity barriers and safety.   Exercise Prescription Goal: Initial exercise prescription builds to 30-45 minutes a day of aerobic activity, 2-3 days per week.  Home exercise guidelines will be given to patient  during program as part of exercise prescription that the participant will acknowledge.  Education: Aerobic Exercise & Resistance Training: - Gives group verbal and written instruction on the various components of exercise. Focuses on aerobic and resistive training programs and the benefits of this training and how to safely progress through these programs..   Pulmonary Rehab from 12/19/2019 in [facility] Cardiac and Pulmonary Rehab  Date 12/19/19  Educator AS  Instruction Review Code 1- Verbalizes Understanding      Education: Exercise & Equipment Safety: - Individual verbal instruction and demonstration of equipment use and safety with use of the equipment.   Pulmonary Rehab from 12/19/2019 in [facility] Cardiac and Pulmonary Rehab  Date 11/22/19  Educator AS  Instruction Review Code 1- Verbalizes Understanding      Education: Exercise Physiology & General Exercise Guidelines: - Group verbal and written instruction with models to review the exercise physiology of the cardiovascular system and associated critical values. Provides general exercise guidelines with specific guidelines to those with heart or lung disease.    Education: Flexibility, Balance, Mind/Body Relaxation: Provides group verbal/written instruction on the benefits of flexibility and balance training, including mind/body exercise modes such as yoga, pilates and tai chi.  Demonstration and skill practice provided.   Activity Barriers & Risk Stratification:   6 Minute Walk:  6 Minute Walk    Row Name 11/22/19 1507         6 Minute Walk   Phase Initial     Distance 1160 feet     Walk Time 6 minutes     # of Rest Breaks 0     MPH 2.2     METS 1.9     RPE 11     Perceived Dyspnea  1     VO2 Peak 6.7     Symptoms No     Resting HR 68 bpm     Resting BP 124/64     Resting Oxygen Saturation  94 %     Exercise Oxygen Saturation  during 6 min walk 88 %     Max Ex. HR 100 bpm     Max Ex. BP 134/64     2 Minute Post BP  108/66       Interval HR   1 Minute HR 86     2 Minute HR 86     3 Minute HR 97     4 Minute HR 100     5 Minute HR 96     6 Minute HR 99     2 Minute Post HR 80     Interval Heart  Rate? Yes       Interval Oxygen   Interval Oxygen? Yes     Baseline Oxygen Saturation % 94 %     1 Minute Oxygen Saturation % 91 %     1 Minute Liters of Oxygen 0 L     2 Minute Oxygen Saturation % 88 %     2 Minute Liters of Oxygen 0 L     3 Minute Oxygen Saturation % 89 %     3 Minute Liters of Oxygen 0 L     4 Minute Oxygen Saturation % 88 %     4 Minute Liters of Oxygen 0 L     5 Minute Oxygen Saturation % 89 %     5 Minute Liters of Oxygen 0 L     6 Minute Oxygen Saturation % 88 %     6 Minute Liters of Oxygen 0 L     2 Minute Post Oxygen Saturation % 93 %     2 Minute Post Liters of Oxygen 0 L           Oxygen Initial Assessment:  Oxygen Initial Assessment - 11/20/19 0836      Home Oxygen   Home Oxygen Device None    Sleep Oxygen Prescription None    Home Exercise Oxygen Prescription None    Home Resting Oxygen Prescription None    Compliance with Home Oxygen Use Yes      Initial 6 min Walk   Oxygen Used None      Program Oxygen Prescription   Program Oxygen Prescription None      Intervention   Short Term Goals To learn and understand importance of monitoring SPO2 with pulse oximeter and demonstrate accurate use of the pulse oximeter.;To learn and understand importance of maintaining oxygen saturations>88%;To learn and demonstrate proper pursed lip breathing techniques or other breathing techniques.;To learn and demonstrate proper use of respiratory medications    Long  Term Goals Verbalizes importance of monitoring SPO2 with pulse oximeter and return demonstration;Maintenance of O2 saturations>88%;Exhibits proper breathing techniques, such as pursed lip breathing or other method taught during program session;Compliance with respiratory medication;Demonstrates proper use of MDI's            Oxygen Re-Evaluation:  Oxygen Re-Evaluation    Row Name 11/26/19 1536 12/20/19 1541           Program Oxygen Prescription   Program Oxygen Prescription None None        Home Oxygen   Home Oxygen Device None None      Sleep Oxygen Prescription None -      Home Exercise Oxygen Prescription None None      Home Resting Oxygen Prescription None None      Compliance with Home Oxygen Use Yes Yes        Goals/Expected Outcomes   Short Term Goals To learn and understand importance of monitoring SPO2 with pulse oximeter and demonstrate accurate use of the pulse oximeter.;To learn and understand importance of maintaining oxygen saturations>88%;To learn and demonstrate proper pursed lip breathing techniques or other breathing techniques.;To learn and demonstrate proper use of respiratory medications To learn and understand importance of monitoring SPO2 with pulse oximeter and demonstrate accurate use of the pulse oximeter.;To learn and understand importance of maintaining oxygen saturations>88%      Long  Term Goals Verbalizes importance of monitoring SPO2 with pulse oximeter and return demonstration;Maintenance of O2 saturations>88%;Exhibits proper breathing techniques, such as pursed lip breathing or  other method taught during program session;Compliance with respiratory medication;Demonstrates proper use of MDI's Verbalizes importance of monitoring SPO2 with pulse oximeter and return demonstration;Maintenance of O2 saturations>88%      Comments Reviewed PLB technique with pt.  Talked about how it works and it's importance in maintaining their exercise saturations. Informed him why it is important to have a pulse oximeter. Reviewed that oxygen saturations should be 88 percent and above. Patient has a pulse oximeter at home to check his oxygen.      Goals/Expected Outcomes Short: Become more profiecient at using PLB.   Long: Become independent at using PLB. Short: monitor oxygen at home with  exertion. Long: maintain oxygen saturations above 88 percent independently.             Oxygen Discharge (Final Oxygen Re-Evaluation):  Oxygen Re-Evaluation - 12/20/19 1541      Program Oxygen Prescription   Program Oxygen Prescription None      Home Oxygen   Home Oxygen Device None    Home Exercise Oxygen Prescription None    Home Resting Oxygen Prescription None    Compliance with Home Oxygen Use Yes      Goals/Expected Outcomes   Short Term Goals To learn and understand importance of monitoring SPO2 with pulse oximeter and demonstrate accurate use of the pulse oximeter.;To learn and understand importance of maintaining oxygen saturations>88%    Long  Term Goals Verbalizes importance of monitoring SPO2 with pulse oximeter and return demonstration;Maintenance of O2 saturations>88%    Comments Informed him why it is important to have a pulse oximeter. Reviewed that oxygen saturations should be 88 percent and above. Patient has a pulse oximeter at home to check his oxygen.    Goals/Expected Outcomes Short: monitor oxygen at home with exertion. Long: maintain oxygen saturations above 88 percent independently.           Initial Exercise Prescription:  Initial Exercise Prescription - 11/22/19 1500      Date of Initial Exercise RX and Referring Provider   Date 11/22/19    Referring Provider [name]      Treadmill   MPH 1.5    Grade 0.5    Minutes 15    METs 2      Recumbant Bike   Level 1    RPM 60    Minutes 15    METs 2      NuStep   Level 2    SPM 80    Minutes 15    METs 2      T5 Nustep   Level 1    SPM 80    Minutes 15    METs 2      Prescription Details   Frequency (times per week) 3    Duration Progress to 30 minutes of continuous aerobic without signs/symptoms of physical distress      Intensity   THRR 40-80% of Max Heartrate 97-126    Ratings of Perceived Exertion 11-15    Perceived Dyspnea 0-4      Progression   Progression Continue to  progress workloads to maintain intensity without signs/symptoms of physical distress.      Resistance Training   Training Prescription Yes    Weight 3 lb    Reps 10-15           Perform Capillary Blood Glucose checks as needed.  Exercise Prescription Changes:  Exercise Prescription Changes    Row Name 11/22/19 1500 12/04/19 1300 12/20/19 1300 01/01/20 0800  Response to Exercise   Blood Pressure (Admit) 124/64 118/60 108/62 118/64    Blood Pressure (Exercise) 134/64 156/64 126/70 128/74    Blood Pressure (Exit) 108/66 122/60 132/62 104/64    Heart Rate (Admit) 68 bpm 67 bpm 81 bpm 62 bpm    Heart Rate (Exercise) 100 bpm 89 bpm 91 bpm 98 bpm    Heart Rate (Exit) 80 bpm 74 bpm 71 bpm 70 bpm    Oxygen Saturation (Admit) 94 % 91 % 95 % 95 %    Oxygen Saturation (Exercise) 88 % 94 % 91 % 96 %    Oxygen Saturation (Exit) 93 % 93 % 94 % 97 %    Rating of Perceived Exertion (Exercise) '11 12 13 14    ' Perceived Dyspnea (Exercise) 1 1 0 1    Symptoms none none none none    Duration - Continue with 30 min of aerobic exercise without signs/symptoms of physical distress. Continue with 30 min of aerobic exercise without signs/symptoms of physical distress. Continue with 30 min of aerobic exercise without signs/symptoms of physical distress.    Intensity - THRR unchanged THRR unchanged THRR unchanged      Progression   Progression - Continue to progress workloads to maintain intensity without signs/symptoms of physical distress. Continue to progress workloads to maintain intensity without signs/symptoms of physical distress. Continue to progress workloads to maintain intensity without signs/symptoms of physical distress.    Average METs - 1.96 2.1 2.34      Resistance Training   Training Prescription - Yes Yes Yes    Weight - 3 lb 3 lb 4 lb    Reps - 10-15 10-15 10-15      Interval Training   Interval Training - No No No      Treadmill   MPH - 1 1.5 1.5    Grade - 0 0.5 1     Minutes - '15 15 15    ' METs - 1.77 2.25 2.35      Recumbant Bike   Level - 1 - 2    Minutes - 15 - 15    METs - 2.2 - 2.94      NuStep   Level - 1 - -    Minutes - 15 - -    METs - 1.9 - -      T5 Nustep   Level - '1 2 2    ' SPM - - 80 80    Minutes - '15 15 15    ' METs - '2 2 2           ' Exercise Comments:   Exercise Goals and Review:  Exercise Goals    Row Name 11/22/19 1514             Exercise Goals   Increase Physical Activity Yes       Intervention Provide advice, education, support and counseling about physical activity/exercise needs.;Develop an individualized exercise prescription for aerobic and resistive training based on initial evaluation findings, risk stratification, comorbidities and participant's personal goals.       Expected Outcomes Short Term: Attend rehab on a regular basis to increase amount of physical activity.;Long Term: Add in home exercise to make exercise part of routine and to increase amount of physical activity.;Long Term: Exercising regularly at least 3-5 days a week.       Increase Strength and Stamina Yes       Intervention Provide advice, education, support and counseling about physical activity/exercise needs.;Develop  an individualized exercise prescription for aerobic and resistive training based on initial evaluation findings, risk stratification, comorbidities and participant's personal goals.       Expected Outcomes Short Term: Increase workloads from initial exercise prescription for resistance, speed, and METs.;Short Term: Perform resistance training exercises routinely during rehab and add in resistance training at home;Long Term: Improve cardiorespiratory fitness, muscular endurance and strength as measured by increased METs and functional capacity (6MWT)       Able to understand and use rate of perceived exertion (RPE) scale Yes       Intervention Provide education and explanation on how to use RPE scale       Expected Outcomes Short  Term: Able to use RPE daily in rehab to express subjective intensity level;Long Term:  Able to use RPE to guide intensity level when exercising independently       Able to understand and use Dyspnea scale Yes       Intervention Provide education and explanation on how to use Dyspnea scale       Expected Outcomes Short Term: Able to use Dyspnea scale daily in rehab to express subjective sense of shortness of breath during exertion;Long Term: Able to use Dyspnea scale to guide intensity level when exercising independently       Knowledge and understanding of Target Heart Rate Range (THRR) Yes       Intervention Provide education and explanation of THRR including how the numbers were predicted and where they are located for reference       Expected Outcomes Short Term: Able to state/look up THRR;Short Term: Able to use daily as guideline for intensity in rehab;Long Term: Able to use THRR to govern intensity when exercising independently       Able to check pulse independently Yes       Intervention Provide education and demonstration on how to check pulse in carotid and radial arteries.;Review the importance of being able to check your own pulse for safety during independent exercise       Expected Outcomes Short Term: Able to explain why pulse checking is important during independent exercise;Long Term: Able to check pulse independently and accurately       Understanding of Exercise Prescription Yes              Exercise Goals Re-Evaluation :  Exercise Goals Re-Evaluation    Row Name 11/26/19 1535 12/04/19 1305 12/20/19 1341 01/01/20 0837       Exercise Goal Re-Evaluation   Exercise Goals Review Increase Physical Activity;Able to understand and use rate of perceived exertion (RPE) scale;Knowledge and understanding of Target Heart Rate Range (THRR);Understanding of Exercise Prescription;Increase Strength and Stamina;Able to understand and use Dyspnea scale;Able to check pulse independently  Increase Physical Activity;Increase Strength and Stamina;Understanding of Exercise Prescription Increase Physical Activity;Increase Strength and Stamina;Understanding of Exercise Prescription Increase Physical Activity;Increase Strength and Stamina;Understanding of Exercise Prescription    Comments Reviewed RPE and dyspnea scales, THR and program prescription with pt today.  Pt voiced understanding and was given a copy of goals to take home. Yonathan is off to a good start in rehab.  He has completed his first four full days of exercise already.  He is already up to 2 METs on the T5 NuStep.  We will continue to monitor his progress. Shail missed some sessions due to a Covid exposure.  He has just started back.  Staff will monitor progress. Toy is continuing to do well in rehab. He is now up  to 4 lbs for resistance training and has increased his treadmill incline to 1 percent. Will continue to monitor.    Expected Outcomes Short: Use RPE daily to regulate intensity. Long: Follow program prescription in THR. Short: Continue to attend rehab regularly  Long: Continue to follow program prescription Short:  get back to regular attendance Long:  improve overall stamina Short: Continue to increase levels on RB Long: Continue to progress overall MET level/endurance           Discharge Exercise Prescription (Final Exercise Prescription Changes):  Exercise Prescription Changes - 01/01/20 0800      Response to Exercise   Blood Pressure (Admit) 118/64    Blood Pressure (Exercise) 128/74    Blood Pressure (Exit) 104/64    Heart Rate (Admit) 62 bpm    Heart Rate (Exercise) 98 bpm    Heart Rate (Exit) 70 bpm    Oxygen Saturation (Admit) 95 %    Oxygen Saturation (Exercise) 96 %    Oxygen Saturation (Exit) 97 %    Rating of Perceived Exertion (Exercise) 14    Perceived Dyspnea (Exercise) 1    Symptoms none    Duration Continue with 30 min of aerobic exercise without signs/symptoms of physical distress.     Intensity THRR unchanged      Progression   Progression Continue to progress workloads to maintain intensity without signs/symptoms of physical distress.    Average METs 2.34      Resistance Training   Training Prescription Yes    Weight 4 lb    Reps 10-15      Interval Training   Interval Training No      Treadmill   MPH 1.5    Grade 1    Minutes 15    METs 2.35      Recumbant Bike   Level 2    Minutes 15    METs 2.94      T5 Nustep   Level 2    SPM 80    Minutes 15    METs 2           Nutrition:  Target Goals: Understanding of nutrition guidelines, daily intake of sodium <1575m, cholesterol <2073m calories 30% from fat and 7% or less from saturated fats, daily to have 5 or more servings of fruits and vegetables.  Education: Controlling Sodium/Reading Food Labels -Group verbal and written material supporting the discussion of sodium use in heart healthy nutrition. Review and explanation with models, verbal and written materials for utilization of the food label.   Education: General Nutrition Guidelines/Fats and Fiber: -Group instruction provided by verbal, written material, models and posters to present the general guidelines for heart healthy nutrition. Gives an explanation and review of dietary fats and fiber.   Biometrics:  Pre Biometrics - 11/22/19 1514      Pre Biometrics   Height 5' 4.5" (1.638 m)    Weight 182 lb 9.6 oz (82.8 kg)    BMI (Calculated) 30.87    Single Leg Stand 22.35 seconds            Nutrition Therapy Plan and Nutrition Goals:   Nutrition Assessments:  Nutrition Assessments - 11/22/19 1516      Rate Your Plate Scores   Pre Score 28           MEDIFICTS Score Key:          ?70 Need to make dietary changes          40-70  Heart Healthy Diet         ? 40 Therapeutic Level Cholesterol Diet  Nutrition Goals Re-Evaluation:   Nutrition Goals Discharge (Final Nutrition Goals Re-Evaluation):   Psychosocial: Target  Goals: Acknowledge presence or absence of significant depression and/or stress, maximize coping skills, provide positive support system. Participant is able to verbalize types and ability to use techniques and skills needed for reducing stress and depression.   Education: Depression - Provides group verbal and written instruction on the correlation between heart/lung disease and depressed mood, treatment options, and the stigmas associated with seeking treatment.   Education: Sleep Hygiene -Provides group verbal and written instruction about how sleep can affect your health.  Define sleep hygiene, discuss sleep cycles and impact of sleep habits. Review good sleep hygiene tips.    Education: Stress and Anxiety: - Provides group verbal and written instruction about the health risks of elevated stress and causes of high stress.  Discuss the correlation between heart/lung disease and anxiety and treatment options. Review healthy ways to manage with stress and anxiety.   Initial Review & Psychosocial Screening:  Initial Psych Review & Screening - 11/20/19 0840      Initial Review   Current issues with None Identified      Family Dynamics   Good Support System? Yes    Comments He can look to his wife and son for support. He is willing to stay healthy and has a positive attitude.      Barriers   Psychosocial barriers to participate in program There are no identifiable barriers or psychosocial needs.;The patient should benefit from training in stress management and relaxation.      Screening Interventions   Interventions Encouraged to exercise;To provide support and resources with identified psychosocial needs;Provide feedback about the scores to participant    Expected Outcomes Short Term goal: Utilizing psychosocial counselor, staff and physician to assist with identification of specific Stressors or current issues interfering with healing process. Setting desired goal for each stressor or  current issue identified.;Long Term Goal: Stressors or current issues are controlled or eliminated.;Short Term goal: Identification and review with participant of any Quality of Life or Depression concerns found by scoring the questionnaire.;Long Term goal: The participant improves quality of Life and PHQ9 Scores as seen by post scores and/or verbalization of changes           Quality of Life Scores:  Scores of 19 and below usually indicate a poorer quality of life in these areas.  A difference of  2-3 points is a clinically meaningful difference.  A difference of 2-3 points in the total score of the Quality of Life Index has been associated with significant improvement in overall quality of life, self-image, physical symptoms, and general health in studies assessing change in quality of life.  PHQ-9: Recent Review Flowsheet Data    Depression screen [facility] 2/9 11/22/2019   Decreased Interest 0   Down, Depressed, Hopeless 0   PHQ - 2 Score 0   Altered sleeping 2    Tired, decreased energy 1   Change in appetite 2   Feeling bad or failure about yourself  0   Trouble concentrating 0   Moving slowly or fidgety/restless 0   Suicidal thoughts 0   PHQ-9 Score 5   Difficult doing work/chores Not difficult at all     Interpretation of Total Score  Total Score Depression Severity:  1-4 = Minimal depression, 5-9 = Mild depression, 10-14 = Moderate depression, 15-19 =  Moderately severe depression, 20-27 = Severe depression   Psychosocial Evaluation and Intervention:  Psychosocial Evaluation - 11/20/19 0842      Psychosocial Evaluation & Interventions   Interventions Encouraged to exercise with the program and follow exercise prescription    Comments He can look to his wife and son for support. He is willing to stay healthy and has a positive attitude.    Expected Outcomes Short: Exercise regularly to support mental health and notify staff of any changes. Long: maintain mental health and well  being through teaching of rehab or prescribed medications independently.    Continue Psychosocial Services  Follow up required by staff           Psychosocial Re-Evaluation:  Psychosocial Re-Evaluation    [place] Name 12/20/19 1544             Psychosocial Re-Evaluation   Current issues with None Identified       Comments Patient reports no issues with their current mental states, sleep, stress, depression or anxiety. Will follow up with patient in a few weeks for any changes.       Expected Outcomes Short: Continue to exercise regularly to support mental health and notify staff of any changes. Long: maintain mental health and well being through teaching of rehab or prescribed medications independently.       Interventions Encouraged to attend Pulmonary Rehabilitation for the exercise       Continue Psychosocial Services  Follow up required by staff              Psychosocial Discharge (Final Psychosocial Re-Evaluation):  Psychosocial Re-Evaluation - 12/20/19 1544      Psychosocial Re-Evaluation   Current issues with None Identified    Comments Patient reports no issues with their current mental states, sleep, stress, depression or anxiety. Will follow up with patient in a few weeks for any changes.    Expected Outcomes Short: Continue to exercise regularly to support mental health and notify staff of any changes. Long: maintain mental health and well being through teaching of rehab or prescribed medications independently.    Interventions Encouraged to attend Pulmonary Rehabilitation for the exercise    Continue Psychosocial Services  Follow up required by staff           Education: Education Goals: Education classes will be provided on a weekly basis, covering required topics. Participant will state understanding/return demonstration of topics presented.  Learning Barriers/Preferences:  Learning Barriers/Preferences - 11/20/19 0837      Learning Barriers/Preferences    Learning Barriers None    Learning Preferences None           General Pulmonary Education Topics:  Infection Prevention: - Provides verbal and written material to individual with discussion of infection control including proper hand washing and proper equipment cleaning during exercise session.   Pulmonary Rehab from 12/19/2019 in [facility] Cardiac and Pulmonary Rehab  Date 11/22/19  Educator AS  Instruction Review Code 1- Verbalizes Understanding      Falls Prevention: - Provides verbal and written material to individual with discussion of falls prevention and safety.   Pulmonary Rehab from 12/19/2019 in [facility] Cardiac and Pulmonary Rehab  Date 11/22/19  Educator AS  Instruction Review Code 1- Verbalizes Understanding      Chronic Lung Diseases: - Group verbal and written instruction to review updates, respiratory medications, advancements in procedures and treatments. Discuss use of supplemental oxygen including available portable oxygen systems, continuous and intermittent flow rates, concentrators, personal use and  safety guidelines. Review proper use of inhaler and spacers. Provide informative websites for self-education.    Pulmonary Rehab from 12/19/2019 in [facility] Cardiac and Pulmonary Rehab  Date 11/28/19  Educator [facility]  Instruction Review Code 1- Verbalizes Understanding      Energy Conservation: - Provide group verbal and written instruction for methods to conserve energy, plan and organize activities. Instruct on pacing techniques, use of adaptive equipment and posture/positioning to relieve shortness of breath.   Pulmonary Rehab from 12/19/2019 in [facility] Cardiac and Pulmonary Rehab  Date 11/28/19  Educator [facility]  Instruction Review Code 1- Verbalizes Understanding      Triggers and Exacerbations: - Group verbal and written instruction to review types of environmental triggers and ways to prevent exacerbations. Discuss weather changes, air quality and the benefits of nasal  washing. Review warning signs and symptoms to help prevent infections. Discuss techniques for effective airway clearance, coughing, and vibrations.   Pulmonary Rehab from 12/19/2019 in [facility] Cardiac and Pulmonary Rehab  Date 11/28/19  Educator [facility]  Instruction Review Code 1- Verbalizes Understanding      AED/CPR: - Group verbal and written instruction with the use of models to demonstrate the basic use of the AED with the basic ABC's of resuscitation.   Anatomy and Physiology of the Lungs: - Group verbal and written instruction with the use of models to provide basic lung anatomy and physiology related to function, structure and complications of lung disease.   Pulmonary Rehab from 12/19/2019 in [facility] Cardiac and Pulmonary Rehab  Date 11/28/19  Educator [facility]  Instruction Review Code 1- Verbalizes Understanding      Anatomy & Physiology of the Heart: - Group verbal and written instruction and models provide basic cardiac anatomy and physiology, with the coronary electrical and arterial systems. Review of Valvular disease and Heart Failure   Cardiac Medications: - Group verbal and written instruction to review commonly prescribed medications for heart disease. Reviews the medication, class of the drug, and side effects.   Other: -Provides group and verbal instruction on various topics (see comments)   Knowledge Questionnaire Score:  Knowledge Questionnaire Score - 11/22/19 1516      Knowledge Questionnaire Score   Pre Score 15/18            Core Components/Risk Factors/Patient Goals at Admission:  Personal Goals and Risk Factors at Admission - 11/22/19 1515      Core Components/Risk Factors/Patient Goals on Admission    Weight Management Yes;Weight Loss    Intervention Weight Management: Provide education and appropriate resources to help participant work on and attain dietary goals.;Weight Management: Develop a combined nutrition and exercise program designed to reach  desired caloric intake, while maintaining appropriate intake of nutrient and fiber, sodium and fats, and appropriate energy expenditure required for the weight goal.;Weight Management/Obesity: Establish reasonable short term and long term weight goals.    Admit Weight 182 lb 9.6 oz (82.8 kg)    Goal Weight: Short Term 175 lb (79.4 kg)    Goal Weight: Long Term 170 lb (77.1 kg)    Expected Outcomes Short Term: Continue to assess and modify interventions until short term weight is achieved;Long Term: Adherence to nutrition and physical activity/exercise program aimed toward attainment of established weight goal;Weight Maintenance: Understanding of the daily nutrition guidelines, which includes 25-35% calories from fat, 7% or less cal from saturated fats, less than 246m cholesterol, less than 1.5gm of sodium, & 5 or more servings of fruits and vegetables daily;Weight Loss: Understanding of general  recommendations for a balanced deficit meal plan, which promotes 1-2 lb weight loss per week and includes a negative energy balance of [phone] kcal/d;Understanding recommendations for meals to include 15-35% energy as protein, 25-35% energy from fat, 35-60% energy from carbohydrates, less than 237m of dietary cholesterol, 20-35 gm of total fiber daily;Understanding of distribution of calorie intake throughout the day with the consumption of 4-5 meals/snacks    Improve shortness of breath with ADL's Yes    Intervention Provide education, individualized exercise plan and daily activity instruction to help decrease symptoms of SOB with activities of daily living.    Expected Outcomes Short Term: Improve cardiorespiratory fitness to achieve a reduction of symptoms when performing ADLs;Long Term: Be able to perform more ADLs without symptoms or delay the onset of symptoms    Hypertension Yes    Intervention Provide education on lifestyle modifcations including regular physical activity/exercise, weight management,  moderate sodium restriction and increased consumption of fresh fruit, vegetables, and low fat dairy, alcohol moderation, and smoking cessation.;Monitor prescription use compliance.    Expected Outcomes Short Term: Continued assessment and intervention until BP is < 140/92mHG in hypertensive participants. < 130/8024mG in hypertensive participants with diabetes, heart failure or chronic kidney disease.;Long Term: Maintenance of blood pressure at goal levels.    Lipids Yes    Intervention Provide education and support for participant on nutrition & aerobic/resistive exercise along with prescribed medications to achieve LDL <84m27mDL >40mg78m Expected Outcomes Short Term: Participant states understanding of desired cholesterol values and is compliant with medications prescribed. Participant is following exercise prescription and nutrition guidelines.;Long Term: Cholesterol controlled with medications as prescribed, with individualized exercise RX and with personalized nutrition plan. Value goals: LDL < 84mg,68m > 40 mg.           Education:Diabetes - Individual verbal and written instruction to review signs/symptoms of diabetes, desired ranges of glucose level fasting, after meals and with exercise. Acknowledge that pre and post exercise glucose checks will be done for 3 sessions at entry of program.   Education: Know Your Numbers and Risk Factors: -Group verbal and written instruction about important numbers in your health.  Discussion of what are risk factors and how they play a role in the disease process.  Review of Cholesterol, Blood Pressure, Diabetes, and BMI and the role they play in your overall health.   Core Components/Risk Factors/Patient Goals Review:   Goals and Risk Factor Review    Row Name 12/20/19 1543             Core Components/Risk Factors/Patient Goals Review   Personal Goals Review Improve shortness of breath with ADL's       Review Spoke to patient about their  shortness of breath and what they can do to improve. Patient has been informed of breathing techniques when starting the program. Patient is informed to tell staff if they have had any med changes and that certain meds they are taking or not taking can be causing shortness of breath.       Expected Outcomes Short: Attend [facility] regularly to improve shortness of breath with ADL's. Long: maintain independence with ADL's              Core Components/Risk Factors/Patient Goals at Discharge (Final Review):   Goals and Risk Factor Review - 12/20/19 1543      Core Components/Risk Factors/Patient Goals Review   Personal Goals Review Improve shortness of breath with ADL's  Review Spoke to patient about their shortness of breath and what they can do to improve. Patient has been informed of breathing techniques when starting the program. Patient is informed to tell staff if they have had any med changes and that certain meds they are taking or not taking can be causing shortness of breath.    Expected Outcomes Short: Attend [facility] regularly to improve shortness of breath with ADL's. Long: maintain independence with ADL's           ITP Comments:  ITP Comments    Row Name 11/20/19 0844 11/26/19 1534 12/05/19 0505 01/02/20 0557     ITP Comments Virtual Visit completed. Patient informed on EP and RD appointment and 6 Minute walk test. Patient also informed of patient health questionnaires on My Chart. Patient Verbalizes understanding. Visit diagnosis can be found in Media, patient is [place]. First full day of exercise!  Patient was oriented to gym and equipment including functions, settings, policies, and procedures.  Patient's individual exercise prescription and treatment plan were reviewed.  All starting workloads were established based on the results of the 6 minute walk test done at initial orientation visit.  The plan for exercise progression was also introduced and progression will be customized  based on patient's performance and goals. 30 Day review completed. Medical Director ITP review done, changes made as directed, and signed approval by Medical Director. 30 Day review completed. Medical Director ITP review done, changes made as directed, and signed approval by Medical Director.           Comments: 30 Day review completed. Medical Director ITP review done, changes made as directed, and signed approval by Medical Director.

## 2020-01-02 NOTE — Progress Notes (Signed)
Daily Session Note  Patient Details  Name: Phillip Chavez MRN: [id number] Date of Birth: December 15, 1940 Referring Provider:     Pulmonary Rehab from 11/22/2019 in [facility] Cardiac and Pulmonary Rehab  Referring Provider [name]      Encounter Date: 01/02/2020  Check In:  Session Check In - 01/02/20 1543      Check-In   Supervising physician immediately available to respond to emergencies See telemetry face sheet for immediately available ER MD    Location [facility]-Cardiac & Pulmonary Rehab    Staff Present Birdie Sons, MPA, RN;Joseph Lou Miner, MS Exercise Physiologist;Meredith Sherryll Burger, RN BSN    Virtual Visit No    Medication changes reported     No    Fall or balance concerns reported    No    Warm-up and Cool-down Performed on first and last piece of equipment    Resistance Training Performed Yes    VAD Patient? No    PAD/SET Patient? No      Pain Assessment   Currently in Pain? No/denies              Social History   Tobacco Use  Smoking Status Former Smoker  . Packs/day: 1.00  . Years: 20.00  . Pack years: 20.00  . Types: Cigarettes  . Quit date: 03/21/1978  . Years since quitting: 41.8  Smokeless Tobacco Never Used    Goals Met:  Independence with exercise equipment Exercise tolerated well No report of cardiac concerns or symptoms Strength training completed today  Goals Unmet:  Not Applicable  Comments: Pt able to follow exercise prescription today without complaint.  Will continue to monitor for progression.    Dr. Emily Filbert is Medical Director for [place] and [facility].

## 2020-01-03 ENCOUNTER — Encounter: Payer: No Typology Code available for payment source | Admitting: *Deleted

## 2020-01-03 ENCOUNTER — Other Ambulatory Visit: Payer: Self-pay

## 2020-01-03 DIAGNOSIS — J449 Chronic obstructive pulmonary disease, unspecified: Secondary | ICD-10-CM | POA: Diagnosis not present

## 2020-01-03 NOTE — Progress Notes (Signed)
Daily Session Note  Patient Details  Name: BABY STAIRS MRN: [id number] Date of Birth: 05/10/1940 Referring Provider:     Pulmonary Rehab from 11/22/2019 in [facility] Cardiac and Pulmonary Rehab  Referring Provider [name]      Encounter Date: 01/03/2020  Check In:  Session Check In - 01/03/20 1537      Check-In   Supervising physician immediately available to respond to emergencies See telemetry face sheet for immediately available ER MD    Location [facility]-Cardiac & Pulmonary Rehab    Staff Present Renita Papa, RN BSN;Joseph [address] [place], [place], [place], CCRP, CCET    Virtual Visit No    Medication changes reported     No    Fall or balance concerns reported    No    Warm-up and Cool-down Performed on first and last piece of equipment    Resistance Training Performed Yes    VAD Patient? No    PAD/SET Patient? No      Pain Assessment   Currently in Pain? No/denies              Social History   Tobacco Use  Smoking Status Former Smoker  . Packs/day: 1.00  . Years: 20.00  . Pack years: 20.00  . Types: Cigarettes  . Quit date: 03/21/1978  . Years since quitting: 41.8  Smokeless Tobacco Never Used    Goals Met:  Independence with exercise equipment Exercise tolerated well No report of cardiac concerns or symptoms Strength training completed today  Goals Unmet:  Not Applicable  Comments: Pt able to follow exercise prescription today without complaint.  Will continue to monitor for progression.    Dr. Emily Filbert is Medical Director for [place] and [facility].

## 2020-01-07 ENCOUNTER — Other Ambulatory Visit: Payer: Self-pay

## 2020-01-07 DIAGNOSIS — J449 Chronic obstructive pulmonary disease, unspecified: Secondary | ICD-10-CM

## 2020-01-07 NOTE — Progress Notes (Signed)
Daily Session Note  Patient Details  Name: Phillip Chavez MRN: [id number] Date of Birth: April 13, 1940 Referring Provider:     Pulmonary Rehab from 11/22/2019 in [facility] Cardiac and Pulmonary Rehab  Referring Provider [name]      Encounter Date: 01/07/2020  Check In:  Session Check In - 01/07/20 1545      Check-In   Supervising physician immediately available to respond to emergencies See telemetry face sheet for immediately available ER MD    Location [facility]-Cardiac & Pulmonary Rehab    Staff Present Birdie Sons, MPA, Mauricia Area, BS, ACSM CEP, Exercise Physiologist;Kara Eliezer Bottom, MS Exercise Physiologist    Virtual Visit No    Medication changes reported     No    Fall or balance concerns reported    No    Warm-up and Cool-down Performed on first and last piece of equipment    Resistance Training Performed Yes    VAD Patient? No    PAD/SET Patient? No      Pain Assessment   Currently in Pain? No/denies              Social History   Tobacco Use  Smoking Status Former Smoker  . Packs/day: 1.00  . Years: 20.00  . Pack years: 20.00  . Types: Cigarettes  . Quit date: 03/21/1978  . Years since quitting: 41.8  Smokeless Tobacco Never Used    Goals Met:  Independence with exercise equipment Exercise tolerated well No report of cardiac concerns or symptoms Strength training completed today  Goals Unmet:  Not Applicable  Comments: Pt able to follow exercise prescription today without complaint.  Will continue to monitor for progression.    Dr. Emily Filbert is Medical Director for [place] and [facility].

## 2020-01-09 ENCOUNTER — Other Ambulatory Visit: Payer: Self-pay

## 2020-01-09 DIAGNOSIS — J449 Chronic obstructive pulmonary disease, unspecified: Secondary | ICD-10-CM | POA: Diagnosis not present

## 2020-01-09 NOTE — Progress Notes (Signed)
Daily Session Note  Patient Details  Name: Phillip Chavez MRN: [id number] Date of Birth: [birth year] Referring Provider:     Pulmonary Rehab from 11/22/2019 in [facility] Cardiac and Pulmonary Rehab  Referring Provider [name]      Encounter Date: 01/09/2020  Check In:  Session Check In - 01/09/20 1542      Check-In   Supervising physician immediately available to respond to emergencies See telemetry face sheet for immediately available ER MD    Location [facility]-Cardiac & Pulmonary Rehab    Staff Present Birdie Sons, MPA, Elveria Rising, BA, ACSM CEP, Exercise Physiologist;Meredith Sherryll Burger, RN Margurite Auerbach, MS Exercise Physiologist    Virtual Visit No    Medication changes reported     No    Fall or balance concerns reported    No    Warm-up and Cool-down Performed on first and last piece of equipment    Resistance Training Performed Yes    VAD Patient? No    PAD/SET Patient? No      Pain Assessment   Currently in Pain? No/denies              Social History   Tobacco Use  Smoking Status Former Smoker  . Packs/day: 1.00  . Years: 20.00  . Pack years: 20.00  . Types: Cigarettes  . Quit date: 03/21/1978  . Years since quitting: 41.8  Smokeless Tobacco Never Used    Goals Met:  Independence with exercise equipment Exercise tolerated well No report of cardiac concerns or symptoms Strength training completed today  Goals Unmet:  Not Applicable  Comments: Pt able to follow exercise prescription today without complaint.  Will continue to monitor for progression.    Dr. Emily Filbert is Medical Director for [place] and [facility].

## 2020-01-10 ENCOUNTER — Other Ambulatory Visit: Payer: Self-pay

## 2020-01-10 ENCOUNTER — Encounter: Payer: No Typology Code available for payment source | Admitting: *Deleted

## 2020-01-10 DIAGNOSIS — J449 Chronic obstructive pulmonary disease, unspecified: Secondary | ICD-10-CM | POA: Diagnosis not present

## 2020-01-10 NOTE — Progress Notes (Signed)
Daily Session Note  Patient Details  Name: Phillip Chavez MRN: [id number] Date of Birth: [birth year] Referring Provider:     Pulmonary Rehab from 11/22/2019 in [facility] Cardiac and Pulmonary Rehab  Referring Provider [name]      Encounter Date: 01/10/2020  Check In:  Session Check In - 01/10/20 1541      Check-In   Supervising physician immediately available to respond to emergencies See telemetry face sheet for immediately available ER MD    Location [facility]-Cardiac & Pulmonary Rehab    Staff Present Renita Papa, RN BSN;Joseph [address] [place], [place], [place], CCRP, CCET    Virtual Visit No    Medication changes reported     No    Fall or balance concerns reported    No    Warm-up and Cool-down Performed on first and last piece of equipment    Resistance Training Performed Yes    VAD Patient? No    PAD/SET Patient? No      Pain Assessment   Currently in Pain? No/denies              Social History   Tobacco Use  Smoking Status Former Smoker  . Packs/day: 1.00  . Years: 20.00  . Pack years: 20.00  . Types: Cigarettes  . Quit date: 03/21/1978  . Years since quitting: 41.8  Smokeless Tobacco Never Used    Goals Met:  Independence with exercise equipment Exercise tolerated well No report of cardiac concerns or symptoms Strength training completed today  Goals Unmet:  Not Applicable  Comments: Pt able to follow exercise prescription today without complaint.  Will continue to monitor for progression.    Dr. Emily Filbert is Medical Director for [place] and [facility].

## 2020-01-14 ENCOUNTER — Other Ambulatory Visit: Payer: Self-pay

## 2020-01-14 DIAGNOSIS — J449 Chronic obstructive pulmonary disease, unspecified: Secondary | ICD-10-CM

## 2020-01-14 NOTE — Progress Notes (Signed)
Daily Session Note  Patient Details  Name: Phillip Chavez MRN: [id number] Date of Birth: [birth year] Referring Provider:     Pulmonary Rehab from 11/22/2019 in [facility] Cardiac and Pulmonary Rehab  Referring Provider [name]      Encounter Date: 01/14/2020  Check In:  Session Check In - 01/14/20 1530      Check-In   Supervising physician immediately available to respond to emergencies See telemetry face sheet for immediately available ER MD    Location [facility]-Cardiac & Pulmonary Rehab    Staff Present Birdie Sons, MPA, Mauricia Area, BS, ACSM CEP, Exercise Physiologist;Kara Eliezer Bottom, MS Exercise Physiologist;Jessica Luan Pulling, MA, RCEP, CCRP, CCET    Virtual Visit No    Medication changes reported     No    Fall or balance concerns reported    No    Warm-up and Cool-down Performed on first and last piece of equipment    Resistance Training Performed Yes    VAD Patient? No    PAD/SET Patient? No      Pain Assessment   Currently in Pain? No/denies              Social History   Tobacco Use  Smoking Status Former Smoker  . Packs/day: 1.00  . Years: 20.00  . Pack years: 20.00  . Types: Cigarettes  . Quit date: 03/21/1978  . Years since quitting: 41.8  Smokeless Tobacco Never Used    Goals Met:  Independence with exercise equipment Exercise tolerated well No report of cardiac concerns or symptoms Strength training completed today  Goals Unmet:  Not Applicable  Comments: Pt able to follow exercise prescription today without complaint.  Will continue to monitor for progression.    Dr. Emily Filbert is Medical Director for [place] and [facility].

## 2020-01-17 ENCOUNTER — Other Ambulatory Visit: Payer: Self-pay

## 2020-01-17 ENCOUNTER — Encounter: Payer: No Typology Code available for payment source | Admitting: *Deleted

## 2020-01-17 DIAGNOSIS — J449 Chronic obstructive pulmonary disease, unspecified: Secondary | ICD-10-CM

## 2020-01-17 NOTE — Progress Notes (Signed)
Daily Session Note  Patient Details  Name: Phillip Chavez MRN: [id number] Date of Birth: [birth year] Referring Provider:     Pulmonary Rehab from 11/22/2019 in [facility] Cardiac and Pulmonary Rehab  Referring Provider [name]      Encounter Date: 01/17/2020  Check In:  Session Check In - 01/17/20 1537      Check-In   Supervising physician immediately available to respond to emergencies See telemetry face sheet for immediately available ER MD    Location [facility]-Cardiac & Pulmonary Rehab    Staff Present Renita Papa, RN BSN;Joseph [address] [place], [place], [place], CCRP, CCET    Virtual Visit No    Medication changes reported     No    Fall or balance concerns reported    No    Tobacco Cessation No Change    Resistance Training Performed Yes    VAD Patient? No    PAD/SET Patient? No      Pain Assessment   Currently in Pain? No/denies              Social History   Tobacco Use  Smoking Status Former Smoker  . Packs/day: 1.00  . Years: 20.00  . Pack years: 20.00  . Types: Cigarettes  . Quit date: 03/21/1978  . Years since quitting: 41.8  Smokeless Tobacco Never Used    Goals Met:  Independence with exercise equipment Exercise tolerated well No report of cardiac concerns or symptoms Strength training completed today  Goals Unmet:  Not Applicable  Comments: Pt able to follow exercise prescription today without complaint.  Will continue to monitor for progression.    Dr. Emily Filbert is Medical Director for [place] and [facility].

## 2020-01-21 ENCOUNTER — Other Ambulatory Visit: Payer: Self-pay

## 2020-01-21 DIAGNOSIS — J449 Chronic obstructive pulmonary disease, unspecified: Secondary | ICD-10-CM

## 2020-01-21 NOTE — Progress Notes (Signed)
Daily Session Note  Patient Details  Name: Phillip Chavez MRN: [id number] Date of Birth: September 27, 1940 Referring Provider:     Pulmonary Rehab from 11/22/2019 in [facility] Cardiac and Pulmonary Rehab  Referring Provider [name]      Encounter Date: 01/21/2020  Check In:  Session Check In - 01/21/20 1531      Check-In   Supervising physician immediately available to respond to emergencies See telemetry face sheet for immediately available ER MD    Location [facility]-Cardiac & Pulmonary Rehab    Staff Present Birdie Sons, MPA, Mauricia Area, BS, ACSM CEP, Exercise Physiologist;Kara Eliezer Bottom, MS Exercise Physiologist    Virtual Visit No    Medication changes reported     No    Fall or balance concerns reported    No    Tobacco Cessation No Change    Warm-up and Cool-down Performed on first and last piece of equipment    Resistance Training Performed Yes    VAD Patient? No    PAD/SET Patient? No      Pain Assessment   Currently in Pain? No/denies              Social History   Tobacco Use  Smoking Status Former Smoker  . Packs/day: 1.00  . Years: 20.00  . Pack years: 20.00  . Types: Cigarettes  . Quit date: 03/21/1978  . Years since quitting: 41.8  Smokeless Tobacco Never Used    Goals Met:  Independence with exercise equipment Exercise tolerated well No report of cardiac concerns or symptoms Strength training completed today  Goals Unmet:  Not Applicable  Comments: Pt able to follow exercise prescription today without complaint.  Will continue to monitor for progression.    Dr. Emily Filbert is Medical Director for [place] and [facility].

## 2020-01-28 ENCOUNTER — Other Ambulatory Visit: Payer: Self-pay

## 2020-01-28 DIAGNOSIS — J449 Chronic obstructive pulmonary disease, unspecified: Secondary | ICD-10-CM | POA: Diagnosis not present

## 2020-01-28 NOTE — Progress Notes (Signed)
Daily Session Note  Patient Details  Name: Phillip Chavez MRN: [id number] Date of Birth: 04/16/1940 Referring Provider:     Pulmonary Rehab from 11/22/2019 in [facility] Cardiac and Pulmonary Rehab  Referring Provider [name]      Encounter Date: 01/28/2020  Check In:  Session Check In - 01/28/20 [place]      Check-In   Supervising physician immediately available to respond to emergencies See telemetry face sheet for immediately available ER MD    Location [facility]-Cardiac & Pulmonary Rehab    Staff Present Birdie Sons, MPA, Mauricia Area, BS, ACSM CEP, Exercise Physiologist;Kara Eliezer Bottom, MS Exercise Physiologist;Meredith Sherryll Burger, RN BSN    Virtual Visit No    Medication changes reported     No    Fall or balance concerns reported    No    Tobacco Cessation No Change    Warm-up and Cool-down Performed on first and last piece of equipment    Resistance Training Performed Yes    VAD Patient? No    PAD/SET Patient? No      Pain Assessment   Currently in Pain? No/denies              Social History   Tobacco Use  Smoking Status Former Smoker  . Packs/day: 1.00  . Years: 20.00  . Pack years: 20.00  . Types: Cigarettes  . Quit date: 03/21/1978  . Years since quitting: 41.8  Smokeless Tobacco Never Used    Goals Met:  Independence with exercise equipment Exercise tolerated well No report of cardiac concerns or symptoms Strength training completed today  Goals Unmet:  Not Applicable  Comments: Pt able to follow exercise prescription today without complaint.  Will continue to monitor for progression.    Dr. Emily Filbert is Medical Director for [place] and [facility].

## 2020-01-30 ENCOUNTER — Encounter: Payer: Self-pay | Admitting: *Deleted

## 2020-01-30 DIAGNOSIS — J449 Chronic obstructive pulmonary disease, unspecified: Secondary | ICD-10-CM

## 2020-01-30 NOTE — Progress Notes (Signed)
Pulmonary Individual Treatment Plan  Patient Details  Name: Phillip Chavez MRN: [id number] Date of Birth: 1940/03/10 Referring Provider:     Pulmonary Rehab from 11/22/2019 in [facility] Cardiac and Pulmonary Rehab  Referring Provider [name]      Initial Encounter Date:    Pulmonary Rehab from 11/22/2019 in [facility] Cardiac and Pulmonary Rehab  Date 11/22/19      Visit Diagnosis: Chronic obstructive pulmonary disease, unspecified COPD type ([place])  Patient's Home Medications on Admission:  Current Outpatient Medications:  .  albuterol (VENTOLIN HFA) 108 (90 Base) MCG/ACT inhaler, Inhale 2 puffs into the lungs 4 (four) times daily as needed for wheezing or shortness of breath., Disp: , Rfl:  .  docusate sodium (COLACE) 100 MG capsule, Take 100 mg by mouth 2 (two) times daily., Disp: , Rfl:  .  Fluticasone-Salmeterol (ADVAIR) 250-50 MCG/DOSE AEPB, Inhale 1 puff into the lungs 2 (two) times daily., Disp: , Rfl:  .  ketotifen (ZADITOR) 0.025 % ophthalmic solution, 1 drop 2 (two) times daily., Disp: , Rfl:  .  losartan (COZAAR) 50 MG tablet, Take 50 mg by mouth daily., Disp: , Rfl:  .  omeprazole (PRILOSEC) 20 MG capsule, Take 20 mg by mouth daily., Disp: , Rfl:  .  pseudoephedrine-acetaminophen (TYLENOL SINUS) 30-500 MG TABS tablet, Take 1 tablet by mouth every 4 (four) hours as needed., Disp: , Rfl:  .  rosuvastatin (CRESTOR) 40 MG tablet, Take 40 mg by mouth daily., Disp: , Rfl:   Past Medical History: No past medical history on file.  Tobacco Use: Social History   Tobacco Use  Smoking Status Former Smoker  . Packs/day: 1.00  . Years: 20.00  . Pack years: 20.00  . Types: Cigarettes  . Quit date: 03/21/1978  . Years since quitting: 41.8  Smokeless Tobacco Never Used    Labs: Recent Review Flowsheet Data   There is no flowsheet data to display.      Pulmonary Assessment Scores:  Pulmonary Assessment Scores    Row Name 11/22/19 1517         ADL UCSD   SOB Score total 41      Rest 1     Walk 2     Stairs 4     Bath 2     Dress 0       CAT Score   CAT Score 16       mMRC Score   mMRC Score 1            UCSD: Self-administered rating of dyspnea associated with activities of daily living (ADLs) 6-point scale (0 = "not at all" to 5 = "maximal or unable to do because of breathlessness")  Scoring Scores range from 0 to 120.  Minimally important difference is 5 units  CAT: CAT can identify the health impairment of COPD patients and is better correlated with disease progression.  CAT has a scoring range of zero to 40. The CAT score is classified into four groups of low (less than 10), medium (10 - 20), high (21-30) and very high (31-40) based on the impact level of disease on health status. A CAT score over 10 suggests significant symptoms.  A worsening CAT score could be explained by an exacerbation, poor medication adherence, poor inhaler technique, or progression of COPD or comorbid conditions.  CAT MCID is 2 points  mMRC: mMRC (Modified Medical Research Council) Dyspnea Scale is used to assess the degree of baseline functional disability in patients of respiratory  disease due to dyspnea. No minimal important difference is established. A decrease in score of 1 point or greater is considered a positive change.   Pulmonary Function Assessment:  Pulmonary Function Assessment - 11/20/19 0837      Initial Spirometry Results   FVC% 81 %    FEV1% 62 %    FEV1/FVC Ratio 59    Comments No post bronchodialator results      Breath   Shortness of Breath Yes;Limiting activity           Exercise Target Goals: Exercise Program Goal: Individual exercise prescription set using results from initial 6 min walk test and THRR while considering  patient's activity barriers and safety.   Exercise Prescription Goal: Initial exercise prescription builds to 30-45 minutes a day of aerobic activity, 2-3 days per week.  Home exercise guidelines will be given to patient  during program as part of exercise prescription that the participant will acknowledge.  Education: Aerobic Exercise: - Group verbal and visual presentation on the components of exercise prescription. Introduces F.I.T.T principle from ACSM for exercise prescriptions.  Reviews F.I.T.T. principles of aerobic exercise including progression. Written material given at graduation.   Pulmonary Rehab from 01/09/2020 in [facility] Cardiac and Pulmonary Rehab  Date 12/19/19  Educator AS  Instruction Review Code 1- Verbalizes Understanding      Education: Resistance Exercise: - Group verbal and visual presentation on the components of exercise prescription. Introduces F.I.T.T principle from ACSM for exercise prescriptions  Reviews F.I.T.T. principles of resistance exercise including progression. Written material given at graduation.    Education: Exercise & Equipment Safety: - Individual verbal instruction and demonstration of equipment use and safety with use of the equipment.   Pulmonary Rehab from 01/09/2020 in [facility] Cardiac and Pulmonary Rehab  Date 11/22/19  Educator AS  Instruction Review Code 1- Verbalizes Understanding      Education: Exercise Physiology & General Exercise Guidelines: - Group verbal and written instruction with models to review the exercise physiology of the cardiovascular system and associated critical values. Provides general exercise guidelines with specific guidelines to those with heart or lung disease.    Education: Flexibility, Balance, Mind/Body Relaxation: - Group verbal and visual presentation with interactive activity on the components of exercise prescription. Introduces F.I.T.T principle from ACSM for exercise prescriptions. Reviews F.I.T.T. principles of flexibility and balance exercise training including progression. Also discusses the mind body connection.  Reviews various relaxation techniques to help reduce and manage stress (i.e. Deep breathing, progressive  muscle relaxation, and visualization). Balance handout provided to take home. Written material given at graduation.   Pulmonary Rehab from 01/09/2020 in [facility] Cardiac and Pulmonary Rehab  Date 01/02/20  Educator AS  Instruction Review Code 1- Verbalizes Understanding      Activity Barriers & Risk Stratification:   6 Minute Walk:  6 Minute Walk    Row Name 11/22/19 1507         6 Minute Walk   Phase Initial     Distance 1160 feet     Walk Time 6 minutes     # of Rest Breaks 0     MPH 2.2     METS 1.9     RPE 11     Perceived Dyspnea  1     VO2 Peak 6.7     Symptoms No     Resting HR 68 bpm     Resting BP 124/64     Resting Oxygen Saturation  94 %  Exercise Oxygen Saturation  during 6 min walk 88 %     Max Ex. HR 100 bpm     Max Ex. BP 134/64     2 Minute Post BP 108/66       Interval HR   1 Minute HR 86     2 Minute HR 86     3 Minute HR 97     4 Minute HR 100     5 Minute HR 96     6 Minute HR 99     2 Minute Post HR 80     Interval Heart Rate? Yes       Interval Oxygen   Interval Oxygen? Yes     Baseline Oxygen Saturation % 94 %     1 Minute Oxygen Saturation % 91 %     1 Minute Liters of Oxygen 0 L     2 Minute Oxygen Saturation % 88 %     2 Minute Liters of Oxygen 0 L     3 Minute Oxygen Saturation % 89 %     3 Minute Liters of Oxygen 0 L     4 Minute Oxygen Saturation % 88 %     4 Minute Liters of Oxygen 0 L     5 Minute Oxygen Saturation % 89 %     5 Minute Liters of Oxygen 0 L     6 Minute Oxygen Saturation % 88 %     6 Minute Liters of Oxygen 0 L     2 Minute Post Oxygen Saturation % 93 %     2 Minute Post Liters of Oxygen 0 L           Oxygen Initial Assessment:  Oxygen Initial Assessment - 11/20/19 0836      Home Oxygen   Home Oxygen Device None    Sleep Oxygen Prescription None    Home Exercise Oxygen Prescription None    Home Resting Oxygen Prescription None    Compliance with Home Oxygen Use Yes      Initial 6 min Walk    Oxygen Used None      Program Oxygen Prescription   Program Oxygen Prescription None      Intervention   Short Term Goals To learn and understand importance of monitoring SPO2 with pulse oximeter and demonstrate accurate use of the pulse oximeter.;To learn and understand importance of maintaining oxygen saturations>88%;To learn and demonstrate proper pursed lip breathing techniques or other breathing techniques.;To learn and demonstrate proper use of respiratory medications    Long  Term Goals Verbalizes importance of monitoring SPO2 with pulse oximeter and return demonstration;Maintenance of O2 saturations>88%;Exhibits proper breathing techniques, such as pursed lip breathing or other method taught during program session;Compliance with respiratory medication;Demonstrates proper use of MDI's           Oxygen Re-Evaluation:  Oxygen Re-Evaluation    Row Name 11/26/19 1536 12/20/19 1541 01/17/20 1543         Program Oxygen Prescription   Program Oxygen Prescription None None None       Home Oxygen   Home Oxygen Device None None None     Sleep Oxygen Prescription None -- None     Home Exercise Oxygen Prescription None None None     Home Resting Oxygen Prescription None None None     Compliance with Home Oxygen Use Yes Yes Yes       Goals/Expected Outcomes   Short Term Goals To  learn and understand importance of monitoring SPO2 with pulse oximeter and demonstrate accurate use of the pulse oximeter.;To learn and understand importance of maintaining oxygen saturations>88%;To learn and demonstrate proper pursed lip breathing techniques or other breathing techniques.;To learn and demonstrate proper use of respiratory medications To learn and understand importance of monitoring SPO2 with pulse oximeter and demonstrate accurate use of the pulse oximeter.;To learn and understand importance of maintaining oxygen saturations>88% To learn and exhibit compliance with exercise, home and travel O2  prescription     Long  Term Goals Verbalizes importance of monitoring SPO2 with pulse oximeter and return demonstration;Maintenance of O2 saturations>88%;Exhibits proper breathing techniques, such as pursed lip breathing or other method taught during program session;Compliance with respiratory medication;Demonstrates proper use of MDI's Verbalizes importance of monitoring SPO2 with pulse oximeter and return demonstration;Maintenance of O2 saturations>88% Exhibits compliance with exercise, home and travel O2 prescription     Comments Reviewed PLB technique with pt.  Talked about how it works and it's importance in maintaining their exercise saturations. Informed him why it is important to have a pulse oximeter. Reviewed that oxygen saturations should be 88 percent and above. Patient has a pulse oximeter at home to check his oxygen. Perle is doing well in [place]. He states that he checks his oxygen at home and also takes his medications routinely. He has no questions about his inhaler use. He knows to be 88 percent and above for his oxygen level.     Goals/Expected Outcomes Short: Become more profiecient at using PLB.   Long: Become independent at using PLB. Short: monitor oxygen at home with exertion. Long: maintain oxygen saturations above 88 percent independently. Short: continue to exercise to reduce shortness of breath. Long: maintain exercise to keep shortness of breath at a minimum.            Oxygen Discharge (Final Oxygen Re-Evaluation):  Oxygen Re-Evaluation - 01/17/20 1543      Program Oxygen Prescription   Program Oxygen Prescription None      Home Oxygen   Home Oxygen Device None    Sleep Oxygen Prescription None    Home Exercise Oxygen Prescription None    Home Resting Oxygen Prescription None    Compliance with Home Oxygen Use Yes      Goals/Expected Outcomes   Short Term Goals To learn and exhibit compliance with exercise, home and travel O2 prescription    Long  Term Goals  Exhibits compliance with exercise, home and travel O2 prescription    Comments Juancarlos is doing well in [place]. He states that he checks his oxygen at home and also takes his medications routinely. He has no questions about his inhaler use. He knows to be 88 percent and above for his oxygen level.    Goals/Expected Outcomes Short: continue to exercise to reduce shortness of breath. Long: maintain exercise to keep shortness of breath at a minimum.           Initial Exercise Prescription:  Initial Exercise Prescription - 11/22/19 1500      Date of Initial Exercise RX and Referring Provider   Date 11/22/19    Referring Provider [name]      Treadmill   MPH 1.5    Grade 0.5    Minutes 15    METs 2      Recumbant Bike   Level 1    RPM 60    Minutes 15    METs 2      NuStep  Level 2    SPM 80    Minutes 15    METs 2      T5 Nustep   Level 1    SPM 80    Minutes 15    METs 2      Prescription Details   Frequency (times per week) 3    Duration Progress to 30 minutes of continuous aerobic without signs/symptoms of physical distress      Intensity   THRR 40-80% of Max Heartrate 97-126    Ratings of Perceived Exertion 11-15    Perceived Dyspnea 0-4      Progression   Progression Continue to progress workloads to maintain intensity without signs/symptoms of physical distress.      Resistance Training   Training Prescription Yes    Weight 3 lb    Reps 10-15           Perform Capillary Blood Glucose checks as needed.  Exercise Prescription Changes:  Exercise Prescription Changes    Row Name 11/22/19 1500 12/04/19 1300 12/20/19 1300 01/01/20 0800 01/14/20 1100     Response to Exercise   Blood Pressure (Admit) 124/64 118/60 108/62 118/64 116/62   Blood Pressure (Exercise) 134/64 156/64 126/70 128/74 128/62   Blood Pressure (Exit) 108/66 122/60 132/62 104/64 124/70   Heart Rate (Admit) 68 bpm 67 bpm 81 bpm 62 bpm 78 bpm   Heart Rate (Exercise) 100 bpm 89  bpm 91 bpm 98 bpm 93 bpm   Heart Rate (Exit) 80 bpm 74 bpm 71 bpm 70 bpm 88 bpm   Oxygen Saturation (Admit) 94 % 91 % 95 % 95 % 90 %   Oxygen Saturation (Exercise) 88 % 94 % 91 % 96 % 89 %   Oxygen Saturation (Exit) 93 % 93 % 94 % 97 % 93 %   Rating of Perceived Exertion (Exercise) '11 12 13 14 13   ' Perceived Dyspnea (Exercise) 1 1 0 1 1   Symptoms none none none none none   Duration -- Continue with 30 min of aerobic exercise without signs/symptoms of physical distress. Continue with 30 min of aerobic exercise without signs/symptoms of physical distress. Continue with 30 min of aerobic exercise without signs/symptoms of physical distress. Continue with 30 min of aerobic exercise without signs/symptoms of physical distress.   Intensity -- THRR unchanged THRR unchanged THRR unchanged THRR unchanged     Progression   Progression -- Continue to progress workloads to maintain intensity without signs/symptoms of physical distress. Continue to progress workloads to maintain intensity without signs/symptoms of physical distress. Continue to progress workloads to maintain intensity without signs/symptoms of physical distress. Continue to progress workloads to maintain intensity without signs/symptoms of physical distress.   Average METs -- 1.96 2.1 2.34 2.5     Resistance Training   Training Prescription -- Yes Yes Yes Yes   Weight -- 3 lb 3 lb 4 lb 4 lb   Reps -- 10-15 10-15 10-15 10-15     Interval Training   Interval Training -- No No No No     Treadmill   MPH -- 1 1.5 1.5 1.8   Grade -- 0 0.5 1 0.5   Minutes -- '15 15 15 15   ' METs -- 1.77 2.25 2.35 2.35     Recumbant Bike   Level -- 1 -- 2 --   Minutes -- 15 -- 15 --   METs -- 2.2 -- 2.94 --     NuStep   Level -- 1 -- -- --  Minutes -- 15 -- -- --   METs -- 1.9 -- -- --     T5 Nustep   Level -- '1 2 2 2   ' SPM -- -- 80 80 80   Minutes -- '15 15 15 15   ' METs -- '2 2 2 2   ' Row Name 01/21/20 1700 01/29/20 0700           Response  to Exercise   Blood Pressure (Admit) -- 132/68      Blood Pressure (Exercise) -- 152/66      Blood Pressure (Exit) -- 130/70      Heart Rate (Admit) -- 57 bpm      Heart Rate (Exercise) -- 84 bpm      Heart Rate (Exit) -- 66 bpm      Oxygen Saturation (Admit) -- 96 %      Oxygen Saturation (Exercise) -- 90 %      Oxygen Saturation (Exit) -- 94 %      Rating of Perceived Exertion (Exercise) -- 13      Symptoms -- none      Duration -- Continue with 30 min of aerobic exercise without signs/symptoms of physical distress.      Intensity -- THRR unchanged        Progression   Progression -- Continue to progress workloads to maintain intensity without signs/symptoms of physical distress.      Average METs -- 2.5        Resistance Training   Training Prescription -- Yes      Weight -- 4 lb      Reps -- 10-15        Interval Training   Interval Training -- No        Treadmill   MPH -- 1.8      Grade -- 0.5      Minutes -- 15      METs -- 2.5        REL-XR   Level -- 2      Minutes -- 15        Home Exercise Plan   Plans to continue exercise at Home (comment) (P)   Treadmill --      Frequency Add 2 additional days to program exercise sessions. (P)   Start with 1 --      Initial Home Exercises Provided 01/21/20 (P)  --             Exercise Comments:   Exercise Goals and Review:  Exercise Goals    Row Name 11/22/19 1514             Exercise Goals   Increase Physical Activity Yes       Intervention Provide advice, education, support and counseling about physical activity/exercise needs.;Develop an individualized exercise prescription for aerobic and resistive training based on initial evaluation findings, risk stratification, comorbidities and participant's personal goals.       Expected Outcomes Short Term: Attend rehab on a regular basis to increase amount of physical activity.;Long Term: Add in home exercise to make exercise part of routine and to increase amount of  physical activity.;Long Term: Exercising regularly at least 3-5 days a week.       Increase Strength and Stamina Yes       Intervention Provide advice, education, support and counseling about physical activity/exercise needs.;Develop an individualized exercise prescription for aerobic and resistive training based on initial evaluation findings, risk stratification, comorbidities and participant's personal goals.  Expected Outcomes Short Term: Increase workloads from initial exercise prescription for resistance, speed, and METs.;Short Term: Perform resistance training exercises routinely during rehab and add in resistance training at home;Long Term: Improve cardiorespiratory fitness, muscular endurance and strength as measured by increased METs and functional capacity (6MWT)       Able to understand and use rate of perceived exertion (RPE) scale Yes       Intervention Provide education and explanation on how to use RPE scale       Expected Outcomes Short Term: Able to use RPE daily in rehab to express subjective intensity level;Long Term:  Able to use RPE to guide intensity level when exercising independently       Able to understand and use Dyspnea scale Yes       Intervention Provide education and explanation on how to use Dyspnea scale       Expected Outcomes Short Term: Able to use Dyspnea scale daily in rehab to express subjective sense of shortness of breath during exertion;Long Term: Able to use Dyspnea scale to guide intensity level when exercising independently       Knowledge and understanding of Target Heart Rate Range (THRR) Yes       Intervention Provide education and explanation of THRR including how the numbers were predicted and where they are located for reference       Expected Outcomes Short Term: Able to state/look up THRR;Short Term: Able to use daily as guideline for intensity in rehab;Long Term: Able to use THRR to govern intensity when exercising independently       Able to  check pulse independently Yes       Intervention Provide education and demonstration on how to check pulse in carotid and radial arteries.;Review the importance of being able to check your own pulse for safety during independent exercise       Expected Outcomes Short Term: Able to explain why pulse checking is important during independent exercise;Long Term: Able to check pulse independently and accurately       Understanding of Exercise Prescription Yes              Exercise Goals Re-Evaluation :  Exercise Goals Re-Evaluation    Row Name 11/26/19 1535 12/04/19 1305 12/20/19 1341 01/01/20 0837 01/14/20 1114     Exercise Goal Re-Evaluation   Exercise Goals Review Increase Physical Activity;Able to understand and use rate of perceived exertion (RPE) scale;Knowledge and understanding of Target Heart Rate Range (THRR);Understanding of Exercise Prescription;Increase Strength and Stamina;Able to understand and use Dyspnea scale;Able to check pulse independently Increase Physical Activity;Increase Strength and Stamina;Understanding of Exercise Prescription Increase Physical Activity;Increase Strength and Stamina;Understanding of Exercise Prescription Increase Physical Activity;Increase Strength and Stamina;Understanding of Exercise Prescription Increase Physical Activity;Increase Strength and Stamina;Understanding of Exercise Prescription   Comments Reviewed RPE and dyspnea scales, THR and program prescription with pt today.  Pt voiced understanding and was given a copy of goals to take home. Jamonte is off to a good start in rehab.  He has completed his first four full days of exercise already.  He is already up to 2 METs on the T5 NuStep.  We will continue to monitor his progress. Dailan missed some sessions due to a Covid exposure.  He has just started back.  Staff will monitor progress. Jonael is continuing to do well in rehab. He is now up to 4 lbs for resistance training and has increased his treadmill  incline to 1 percent. Will continue to monitor.  Oddie attends consistently and works at [employer]. Oxygen has been in 90s except for once at 89%.  He is slowly improving MET level.  Staff will monitor.progress.   Expected Outcomes Short: Use RPE daily to regulate intensity. Long: Follow program prescription in THR. Short: Continue to attend rehab regularly  Long: Continue to follow program prescription Short:  get back to regular attendance Long:  improve overall stamina Short: Continue to increase levels on RB Long: Continue to progress overall MET level/endurance Short: continue to attend consistenty Long: improve overall stamina   Row Name 01/21/20 1658 01/29/20 0742           Exercise Goal Re-Evaluation   Exercise Goals Review -- Increase Physical Activity;Increase Strength and Stamina;Understanding of Exercise Prescription      Comments Reviewed home exercise with pt today.  Pt plans to walk on his treadmill at home for exercise.  Reviewed THR, pulse, RPE, sign and symptoms, pulse oximetery and when to call 911 or MD.  Also discussed weather considerations and indoor options.  Pt voiced understanding. Gurshaan attends consistently.  he has increased speed on TM to 1.8.  Staff will encourage increasing levels on seated machines.      Expected Outcomes Short: Start with adding in 1 day of exercise at home Long: Be able to exercise independently at home using skills and techniques learned at rehab Short:  increase levels on seated machines Long: improve overall stamina             Discharge Exercise Prescription (Final Exercise Prescription Changes):  Exercise Prescription Changes - 01/29/20 0700      Response to Exercise   Blood Pressure (Admit) 132/68    Blood Pressure (Exercise) 152/66    Blood Pressure (Exit) 130/70    Heart Rate (Admit) 57 bpm    Heart Rate (Exercise) 84 bpm    Heart Rate (Exit) 66 bpm    Oxygen Saturation (Admit) 96 %    Oxygen Saturation (Exercise) 90 %    Oxygen  Saturation (Exit) 94 %    Rating of Perceived Exertion (Exercise) 13    Symptoms none    Duration Continue with 30 min of aerobic exercise without signs/symptoms of physical distress.    Intensity THRR unchanged      Progression   Progression Continue to progress workloads to maintain intensity without signs/symptoms of physical distress.    Average METs 2.5      Resistance Training   Training Prescription Yes    Weight 4 lb    Reps 10-15      Interval Training   Interval Training No      Treadmill   MPH 1.8    Grade 0.5    Minutes 15    METs 2.5      REL-XR   Level 2    Minutes 15           Nutrition:  Target Goals: Understanding of nutrition guidelines, daily intake of sodium <1520m, cholesterol <2047m calories 30% from fat and 7% or less from saturated fats, daily to have 5 or more servings of fruits and vegetables.  Education: All About Nutrition: -Group instruction provided by verbal, written material, interactive activities, discussions, models, and posters to present general guidelines for heart healthy nutrition including fat, fiber, MyPlate, the role of sodium in heart healthy nutrition, utilization of the nutrition label, and utilization of this knowledge for meal planning. Follow up email sent as well. Written material given at graduation.  Pulmonary Rehab from 01/09/2020 in [facility] Cardiac and Pulmonary Rehab  Date 01/09/20  Educator [facility]  Instruction Review Code 1- Verbalizes Understanding      Biometrics:  Pre Biometrics - 11/22/19 1514      Pre Biometrics   Height 5' 4.5" (1.638 m)    Weight 182 lb 9.6 oz (82.8 kg)    BMI (Calculated) 30.87    Single Leg Stand 22.35 seconds            Nutrition Therapy Plan and Nutrition Goals:  Nutrition Therapy & Goals - 01/14/20 1540      Nutrition Therapy   Diet Heart healhty, low Na, Pulmonary MNT    Drug/Food Interactions Statins/Certain Fruits    Protein (specify units) 100g    Fiber 30 grams     Whole Grain Foods 3 servings    Saturated Fats 12 max. grams    Fruits and Vegetables 8 servings/day    Sodium 1.5 grams      Personal Nutrition Goals   Nutrition Goal ST: Add protien to lunch such as hard boiled egg, add vegetables to breakfast like peppers and onions to morning eggs LT: Lose weight to 160, limit Na to no more than 1583m/day, limit red meat to 1-2x/week, 8 fruits and vegetables per day    Comments B: scrambled egg with 1 piece of sausage (small) - coffee (cream and sugar) or almond milk. L: two cheese dogs and sweet tea when out, at home tomato sandwich with sugar free bread (multigrain) and mayo (possibly 2) or soup (canned) - vegetable or chicken noodle. D: roast pork and potatoes last night with carrots and celery. Most of the time he will have pork chop or hamburger. Will sometimes get canned chicken breast or tuna for a chciken or tuna salad. Sometimes with get chicken chomein out to eat. He may have glucerna instead of a meal with almond milk and peanut butter. He reports liking pinto beans as well - will add salt. He tries to limit his CHO - discussed importance of complex CHO.  Discussed heart healthy eating - pt attended education last week on nutrition - reviewed. Discussed low sodium canned products.      Intervention Plan   Intervention Prescribe, educate and counsel regarding individualized specific dietary modifications aiming towards targeted core components such as weight, hypertension, lipid management, diabetes, heart failure and other comorbidities.;Nutrition handout(s) given to patient.    Expected Outcomes Short Term Goal: Understand basic principles of dietary content, such as calories, fat, sodium, cholesterol and nutrients.;Short Term Goal: A plan has been developed with personal nutrition goals set during dietitian appointment.;Long Term Goal: Adherence to prescribed nutrition plan.           Nutrition Assessments:  Nutrition Assessments - 11/22/19 1516       Rate Your Plate Scores   Pre Score 28          MEDIFICTS Score Key:  ?70 Need to make dietary changes   40-70 Heart Healthy Diet  ? 40 Therapeutic Level Cholesterol Diet   Picture Your Plate Scores:  <<17Unhealthy dietary pattern with much room for improvement.  41-50 Dietary pattern unlikely to meet recommendations for good health and room for improvement.  51-60 More healthful dietary pattern, with some room for improvement.   >60 Healthy dietary pattern, although there may be some specific behaviors that could be improved.   Nutrition Goals Re-Evaluation:   Nutrition Goals Discharge (Final Nutrition Goals Re-Evaluation):   Psychosocial: Target Goals:  Acknowledge presence or absence of significant depression and/or stress, maximize coping skills, provide positive support system. Participant is able to verbalize types and ability to use techniques and skills needed for reducing stress and depression.   Education: Stress, Anxiety, and Depression - Group verbal and visual presentation to define topics covered.  Reviews how body is impacted by stress, anxiety, and depression.  Also discusses healthy ways to reduce stress and to treat/manage anxiety and depression.  Written material given at graduation.   Education: Sleep Hygiene -Provides group verbal and written instruction about how sleep can affect your health.  Define sleep hygiene, discuss sleep cycles and impact of sleep habits. Review good sleep hygiene tips.    Initial Review & Psychosocial Screening:  Initial Psych Review & Screening - 11/20/19 0840      Initial Review   Current issues with None Identified      Family Dynamics   Good Support System? Yes    Comments He can look to his wife and son for support. He is willing to stay healthy and has a positive attitude.      Barriers   Psychosocial barriers to participate in program There are no identifiable barriers or psychosocial needs.;The patient  should benefit from training in stress management and relaxation.      Screening Interventions   Interventions Encouraged to exercise;To provide support and resources with identified psychosocial needs;Provide feedback about the scores to participant    Expected Outcomes Short Term goal: Utilizing psychosocial counselor, staff and physician to assist with identification of specific Stressors or current issues interfering with healing process. Setting desired goal for each stressor or current issue identified.;Long Term Goal: Stressors or current issues are controlled or eliminated.;Short Term goal: Identification and review with participant of any Quality of Life or Depression concerns found by scoring the questionnaire.;Long Term goal: The participant improves quality of Life and PHQ9 Scores as seen by post scores and/or verbalization of changes           Quality of Life Scores:  Scores of 19 and below usually indicate a poorer quality of life in these areas.  A difference of  2-3 points is a clinically meaningful difference.  A difference of 2-3 points in the total score of the Quality of Life Index has been associated with significant improvement in overall quality of life, self-image, physical symptoms, and general health in studies assessing change in quality of life.  PHQ-9: Recent Review Flowsheet Data    Depression screen [facility] 2/9 01/17/2020 11/22/2019   Decreased Interest 0 0   Down, Depressed, Hopeless 0 0   PHQ - 2 Score 0 0   Altered sleeping 0 2    Tired, decreased energy 0 1   Change in appetite 2 2   Feeling bad or failure about yourself  0 0   Trouble concentrating 0 0   Moving slowly or fidgety/restless 0 0   Suicidal thoughts 0 0   PHQ-9 Score 2 5   Difficult doing work/chores Not difficult at all Not difficult at all     Interpretation of Total Score  Total Score Depression Severity:  1-4 = Minimal depression, 5-9 = Mild depression, 10-14 = Moderate depression, 15-19  = Moderately severe depression, 20-27 = Severe depression   Psychosocial Evaluation and Intervention:  Psychosocial Evaluation - 11/20/19 0842      Psychosocial Evaluation & Interventions   Interventions Encouraged to exercise with the program and follow exercise prescription    Comments He  can look to his wife and son for support. He is willing to stay healthy and has a positive attitude.    Expected Outcomes Short: Exercise regularly to support mental health and notify staff of any changes. Long: maintain mental health and well being through teaching of rehab or prescribed medications independently.    Continue Psychosocial Services  Follow up required by staff           Psychosocial Re-Evaluation:  Psychosocial Re-Evaluation    [place] Name 12/20/19 1544 01/17/20 1553           Psychosocial Re-Evaluation   Current issues with None Identified None Identified      Comments Patient reports no issues with their current mental states, sleep, stress, depression or anxiety. Will follow up with patient in a few weeks for any changes. Reviewed patient health questionnaire (PHQ-9) with patient for follow up. Previously, patients score indicated signs/symptoms of depression.  Reviewed to see if patient is improving symptom wise while in program.  Score improved and patient states that it is because he has been exercising and confiding in the [place].      Expected Outcomes Short: Continue to exercise regularly to support mental health and notify staff of any changes. Long: maintain mental health and well being through teaching of rehab or prescribed medications independently. Short: Continue to attend [facility] regularly for regular exercise and social engagement. Long: Continue to improve symptoms and manage a positive mental state.      Interventions Encouraged to attend Pulmonary Rehabilitation for the exercise Encouraged to attend Pulmonary Rehabilitation for the exercise      Continue Psychosocial  Services  Follow up required by staff Follow up required by staff             Psychosocial Discharge (Final Psychosocial Re-Evaluation):  Psychosocial Re-Evaluation - 01/17/20 1553      Psychosocial Re-Evaluation   Current issues with None Identified    Comments Reviewed patient health questionnaire (PHQ-9) with patient for follow up. Previously, patients score indicated signs/symptoms of depression.  Reviewed to see if patient is improving symptom wise while in program.  Score improved and patient states that it is because he has been exercising and confiding in the [place].    Expected Outcomes Short: Continue to attend [facility] regularly for regular exercise and social engagement. Long: Continue to improve symptoms and manage a positive mental state.    Interventions Encouraged to attend Pulmonary Rehabilitation for the exercise    Continue Psychosocial Services  Follow up required by staff           Education: Education Goals: Education classes will be provided on a weekly basis, covering required topics. Participant will state understanding/return demonstration of topics presented.  Learning Barriers/Preferences:  Learning Barriers/Preferences - 11/20/19 0837      Learning Barriers/Preferences   Learning Barriers None    Learning Preferences None           General Pulmonary Education Topics:  Infection Prevention: - Provides verbal and written material to individual with discussion of infection control including proper hand washing and proper equipment cleaning during exercise session.   Pulmonary Rehab from 01/09/2020 in [facility] Cardiac and Pulmonary Rehab  Date 11/22/19  Educator AS  Instruction Review Code 1- Verbalizes Understanding      Falls Prevention: - Provides verbal and written material to individual with discussion of falls prevention and safety.   Pulmonary Rehab from 01/09/2020 in [facility] Cardiac and Pulmonary Rehab  Date 11/22/19  Educator  AS  Instruction  Review Code 1- Verbalizes Understanding      Chronic Lung Disease Review: - Group verbal instruction with posters, models, PowerPoint presentations and videos,  to review new updates, new respiratory medications, new advancements in procedures and treatments. Providing information on websites and "800" numbers for continued self-education. Includes information about supplement oxygen, available portable oxygen systems, continuous and intermittent flow rates, oxygen safety, concentrators, and Medicare reimbursement for oxygen. Explanation of Pulmonary Drugs, including class, frequency, complications, importance of spacers, rinsing mouth after steroid MDI's, and proper cleaning methods for nebulizers. Review of basic lung anatomy and physiology related to function, structure, and complications of lung disease. Review of risk factors. Discussion about methods for diagnosing sleep apnea and types of masks and machines for OSA. Includes a review of the use of types of environmental controls: home humidity, furnaces, filters, dust mite/pet prevention, HEPA vacuums. Discussion about weather changes, air quality and the benefits of nasal washing. Instruction on Warning signs, infection symptoms, calling MD promptly, preventive modes, and value of vaccinations. Review of effective airway clearance, coughing and/or vibration techniques. Emphasizing that all should Create an Action Plan. Written material given at graduation.   Pulmonary Rehab from 01/09/2020 in [facility]iac and Pulmonary Rehab  Date 11/28/19  Educator [facility]  Instruction Review Code 1- Verbalizes Understanding      AED/CPR: - Group verbal and written instruction with the use of models to demonstrate the basic use of the AED with the basic ABC's of resuscitation.    Anatomy and Cardiac Procedures: - Group verbal and visual presentation and models provide information about basic cardiac anatomy and function. Reviews the testing methods done to  diagnose heart disease and the outcomes of the test results. Describes the treatment choices: Medical Management, Angioplasty, or Coronary Bypass Surgery for treating various heart conditions including Myocardial Infarction, Angina, Valve Disease, and Cardiac Arrhythmias.  Written material given at graduation.   Medication Safety: - Group verbal and visual instruction to review commonly prescribed medications for heart and lung disease. Reviews the medication, class of the drug, and side effects. Includes the steps to properly store meds and maintain the prescription regimen.  Written material given at graduation.   Other: -Provides group and verbal instruction on various topics (see comments)   Knowledge Questionnaire Score:  Knowledge Questionnaire Score - 11/22/19 1516      Knowledge Questionnaire Score   Pre Score 15/18            Core Components/Risk Factors/Patient Goals at Admission:  Personal Goals and Risk Factors at Admission - 11/22/19 1515      Core Components/Risk Factors/Patient Goals on Admission    Weight Management Yes;Weight Loss    Intervention Weight Management: Provide education and appropriate resources to help participant work on and attain dietary goals.;Weight Management: Develop a combined nutrition and exercise program designed to reach desired caloric intake, while maintaining appropriate intake of nutrient and fiber, sodium and fats, and appropriate energy expenditure required for the weight goal.;Weight Management/Obesity: Establish reasonable short term and long term weight goals.    Admit Weight 182 lb 9.6 oz (82.8 kg)    Goal Weight: Short Term 175 lb (79.4 kg)    Goal Weight: Long Term 170 lb (77.1 kg)    Expected Outcomes Short Term: Continue to assess and modify interventions until short term weight is achieved;Long Term: Adherence to nutrition and physical activity/exercise program aimed toward attainment of established weight goal;Weight  Maintenance: Understanding of the daily nutrition guidelines, which  includes 25-35% calories from fat, 7% or less cal from saturated fats, less than 25m cholesterol, less than 1.5gm of sodium, & 5 or more servings of fruits and vegetables daily;Weight Loss: Understanding of general recommendations for a balanced deficit meal plan, which promotes 1-2 lb weight loss per week and includes a negative energy balance of [phone] kcal/d;Understanding recommendations for meals to include 15-35% energy as protein, 25-35% energy from fat, 35-60% energy from carbohydrates, less than 2067mof dietary cholesterol, 20-35 gm of total fiber daily;Understanding of distribution of calorie intake throughout the day with the consumption of 4-5 meals/snacks    Improve shortness of breath with ADL's Yes    Intervention Provide education, individualized exercise plan and daily activity instruction to help decrease symptoms of SOB with activities of daily living.    Expected Outcomes Short Term: Improve cardiorespiratory fitness to achieve a reduction of symptoms when performing ADLs;Long Term: Be able to perform more ADLs without symptoms or delay the onset of symptoms    Hypertension Yes    Intervention Provide education on lifestyle modifcations including regular physical activity/exercise, weight management, moderate sodium restriction and increased consumption of fresh fruit, vegetables, and low fat dairy, alcohol moderation, and smoking cessation.;Monitor prescription use compliance.    Expected Outcomes Short Term: Continued assessment and intervention until BP is < 140/9020mG in hypertensive participants. < 130/46m78m in hypertensive participants with diabetes, heart failure or chronic kidney disease.;Long Term: Maintenance of blood pressure at goal levels.    Lipids Yes    Intervention Provide education and support for participant on nutrition & aerobic/resistive exercise along with prescribed medications to achieve  LDL <70mg43mL >40mg.36mExpected Outcomes Short Term: Participant states understanding of desired cholesterol values and is compliant with medications prescribed. Participant is following exercise prescription and nutrition guidelines.;Long Term: Cholesterol controlled with medications as prescribed, with individualized exercise RX and with personalized nutrition plan. Value goals: LDL < 70mg, 7m> 40 mg.           Education:Diabetes - Individual verbal and written instruction to review signs/symptoms of diabetes, desired ranges of glucose level fasting, after meals and with exercise. Acknowledge that pre and post exercise glucose checks will be done for 3 sessions at entry of program.   Know Your Numbers and Heart Failure: - Group verbal and visual instruction to discuss disease risk factors for cardiac and pulmonary disease and treatment options.  Reviews associated critical values for Overweight/Obesity, Hypertension, Cholesterol, and Diabetes.  Discusses basics of heart failure: signs/symptoms and treatments.  Introduces Heart Failure Zone chart for action plan for heart failure.  Written material given at graduation.   Core Components/Risk Factors/Patient Goals Review:   Goals and Risk Factor Review    Row Name 12/20/19 1543 01/17/20 1546           Core Components/Risk Factors/Patient Goals Review   Personal Goals Review Improve shortness of breath with ADL's Improve shortness of breath with ADL's;Weight Management/Obesity      Review Spoke to patient about their shortness of breath and what they can do to improve. Patient has been informed of breathing techniques when starting the program. Patient is informed to tell staff if they have had any med changes and that certain meds they are taking or not taking can be causing shortness of breath. Ulus Roshawn that he is doing better with his ADLs and is working to improve them more. He has not been able to lose weight but he feels like  he  has gained some muscle.      Expected Outcomes Short: Attend [facility] regularly to improve shortness of breath with ADL's. Long: maintain independence with ADL's Short: maintain exercise to improve ADL and lose weight. Long: lose a few pounds in the next few weeks.             Core Components/Risk Factors/Patient Goals at Discharge (Final Review):   Goals and Risk Factor Review - 01/17/20 1546      Core Components/Risk Factors/Patient Goals Review   Personal Goals Review Improve shortness of breath with ADL's;Weight Management/Obesity    Review Onie states that he is doing better with his ADLs and is working to improve them more. He has not been able to lose weight but he feels like he has gained some muscle.    Expected Outcomes Short: maintain exercise to improve ADL and lose weight. Long: lose a few pounds in the next few weeks.           ITP Comments:  ITP Comments    Row Name 11/20/19 0844 11/26/19 1534 12/05/19 0505 01/02/20 0557 01/30/20 0852   ITP Comments Virtual Visit completed. Patient informed on EP and RD appointment and 6 Minute walk test. Patient also informed of patient health questionnaires on My Chart. Patient Verbalizes understanding. Visit diagnosis can be found in Media, patient is [place]. First full day of exercise!  Patient was oriented to gym and equipment including functions, settings, policies, and procedures.  Patient's individual exercise prescription and treatment plan were reviewed.  All starting workloads were established based on the results of the 6 minute walk test done at initial orientation visit.  The plan for exercise progression was also introduced and progression will be customized based on patient's performance and goals. 30 Day review completed. Medical Director ITP review done, changes made as directed, and signed approval by Medical Director. 30 Day review completed. Medical Director ITP review done, changes made as directed, and signed approval by  Medical Director. 30 Day review completed. Medical Director ITP review done, changes made as directed, and signed approval by Medical Director.          Comments:

## 2020-02-04 ENCOUNTER — Other Ambulatory Visit: Payer: Self-pay

## 2020-02-04 ENCOUNTER — Encounter: Payer: No Typology Code available for payment source | Attending: Internal Medicine

## 2020-02-04 DIAGNOSIS — J449 Chronic obstructive pulmonary disease, unspecified: Secondary | ICD-10-CM | POA: Diagnosis present

## 2020-02-04 NOTE — Progress Notes (Signed)
Daily Session Note  Patient Details  Name: RUTILIO YELLOWHAIR MRN: [id number] Date of Birth: 09-26-1940 Referring Provider:     Pulmonary Rehab from 11/22/2019 in [facility] Cardiac and Pulmonary Rehab  Referring Provider [name]      Encounter Date: 02/04/2020  Check In:  Session Check In - 02/04/20 1549      Check-In   Supervising physician immediately available to respond to emergencies See telemetry face sheet for immediately available ER MD    Location [facility]-Cardiac & Pulmonary Rehab    Staff Present Birdie Sons, MPA, [place]icia Area, BS, ACSM CEP, Exercise Physiologist;Kara Eliezer Bottom, MS Exercise Physiologist    Virtual Visit No    Medication changes reported     No    Fall or balance concerns reported    No    Tobacco Cessation No Change    Warm-up and Cool-down Performed on first and last piece of equipment    Resistance Training Performed Yes    VAD Patient? No    PAD/SET Patient? No      Pain Assessment   Currently in Pain? No/denies              Social History   Tobacco Use  Smoking Status Former Smoker  . Packs/day: 1.00  . Years: 20.00  . Pack years: 20.00  . Types: Cigarettes  . Quit date: 03/21/1978  . Years since quitting: 41.9  Smokeless Tobacco Never Used    Goals Met:  Independence with exercise equipment Exercise tolerated well No report of cardiac concerns or symptoms Strength training completed today  Goals Unmet:  Not Applicable  Comments: Pt able to follow exercise prescription today without complaint.  Will continue to monitor for progression.    Dr. Emily Filbert is Medical Director for [place] and [facility].

## 2020-02-06 ENCOUNTER — Other Ambulatory Visit: Payer: Self-pay

## 2020-02-06 DIAGNOSIS — J449 Chronic obstructive pulmonary disease, unspecified: Secondary | ICD-10-CM

## 2020-02-06 NOTE — Progress Notes (Signed)
Daily Session Note  Patient Details  Name: Phillip Chavez MRN: [id number] Date of Birth: 03-24-1940 Referring Provider:     Pulmonary Rehab from 11/22/2019 in [facility] Cardiac and Pulmonary Rehab  Referring Provider [name]      Encounter Date: 02/06/2020  Check In:  Session Check In - 02/06/20 1603      Check-In   Supervising physician immediately available to respond to emergencies See telemetry face sheet for immediately available ER MD    Location [facility]-Cardiac & Pulmonary Rehab    Staff Present Birdie Sons, MPA, RN;Joseph Lou Miner, [place] Exercise Physiologist    Virtual Visit No    Medication changes reported     No    Fall or balance concerns reported    No    Tobacco Cessation No Change    Warm-up and Cool-down Performed on first and last piece of equipment    Resistance Training Performed Yes    VAD Patient? No    PAD/SET Patient? No      Pain Assessment   Currently in Pain? No/denies              Social History   Tobacco Use  Smoking Status Former Smoker  . Packs/day: 1.00  . Years: 20.00  . Pack years: 20.00  . Types: Cigarettes  . Quit date: 03/21/1978  . Years since quitting: 41.9  Smokeless Tobacco Never Used    Goals Met:  Independence with exercise equipment Exercise tolerated well No report of cardiac concerns or symptoms Strength training completed today  Goals Unmet:  Not Applicable  Comments: Pt able to follow exercise prescription today without complaint.  Will continue to monitor for progression.    Dr. Emily Filbert is Medical Director for [place] and [facility]monary [facility].

## 2020-02-07 ENCOUNTER — Other Ambulatory Visit: Payer: Self-pay

## 2020-02-07 ENCOUNTER — Encounter: Payer: No Typology Code available for payment source | Admitting: *Deleted

## 2020-02-07 DIAGNOSIS — J449 Chronic obstructive pulmonary disease, unspecified: Secondary | ICD-10-CM | POA: Diagnosis not present

## 2020-02-07 NOTE — Progress Notes (Signed)
Daily Session Note  Patient Details  Name: Phillip Chavez MRN: [id number] Date of Birth: 26-Jul-1940 Referring Provider:   Flowsheet Row Pulmonary Rehab from 11/22/2019 in [facility] Cardiac and Pulmonary Rehab  Referring Provider [name] [place]      Encounter Date: 02/07/2020  Check In:  Session Check In - 02/07/20 1541      Check-In   Supervising physician immediately available to respond to emergencies See telemetry face sheet for immediately available ER MD    Location [facility]-Cardiac & Pulmonary Rehab    Staff Present Renita Papa, RN BSN;Joseph [address] [place], [place], [place], CCRP, CCET    Virtual Visit No    Medication changes reported     No    Fall or balance concerns reported    No    Warm-up and Cool-down Performed on first and last piece of equipment    Resistance Training Performed Yes    VAD Patient? No    PAD/SET Patient? No      Pain Assessment   Currently in Pain? No/denies              Social History   Tobacco Use  Smoking Status Former Smoker  . Packs/day: 1.00  . Years: 20.00  . Pack years: 20.00  . Types: Cigarettes  . Quit date: 03/21/1978  . Years since quitting: 41.9  Smokeless Tobacco Never Used    Goals Met:  Independence with exercise equipment Exercise tolerated well No report of cardiac concerns or symptoms Strength training completed today  Goals Unmet:  Not Applicable  Comments: Pt able to follow exercise prescription today without complaint.  Will continue to monitor for progression.    Dr. Emily Filbert is Medical Director for [facility] and [facility].

## 2020-02-11 ENCOUNTER — Other Ambulatory Visit: Payer: Self-pay

## 2020-02-11 DIAGNOSIS — J449 Chronic obstructive pulmonary disease, unspecified: Secondary | ICD-10-CM

## 2020-02-11 NOTE — Progress Notes (Signed)
Daily Session Note  Patient Details  Name: Phillip Chavez MRN: [id number] Date of Birth: Apr 01, 1940 Referring Provider:   Flowsheet Row Pulmonary Rehab from 11/22/2019 in [facility] Cardiac and Pulmonary Rehab  Referring Provider [name] [place]      Encounter Date: 02/11/2020  Check In:  Session Check In - 02/11/20 1547      Check-In   Supervising physician immediately available to respond to emergencies See telemetry face sheet for immediately available ER MD    Location [facility] & Pul[facility] Re[facility]    Staff Present Birdie Sons, MPA, Mauricia Area, BS, ACSM CEP, Exercise Physiologist;Kara Eliezer Bottom, MS Exercise Physiologist    Virtual Visit No    Medication changes reported     No    Fall or balance concerns reported    No    Tobacco Cessation No Change    Warm-up and Cool-down Performed on first and last piece of equipment    Resistance Training Performed Yes    VAD Patient? No    PAD/SET Patient? No      Pain Assessment   Currently in Pain? No/denies              Social History   Tobacco Use  Smoking Status Former Smoker  . Packs/day: 1.00  . Years: 20.00  . Pack years: 20.00  . Types: Cigarettes  . Quit date: 03/21/1978  . Years since quitting: 41.9  Smokeless Tobacco Never Used    Goals Met:  Independence with exercise equipment Exercise tolerated well Personal goals reviewed No report of cardiac concerns or symptoms Strength training completed today  Goals Unmet:  Not Applicable  Comments: Pt able to follow exercise prescription today without complaint.  Will continue to monitor for progression.    Dr. Emily Filbert is Medical Director for [place] and [facility].

## 2020-02-13 ENCOUNTER — Encounter: Payer: No Typology Code available for payment source | Admitting: *Deleted

## 2020-02-13 ENCOUNTER — Other Ambulatory Visit: Payer: Self-pay

## 2020-02-13 DIAGNOSIS — J449 Chronic obstructive pulmonary disease, unspecified: Secondary | ICD-10-CM

## 2020-02-13 NOTE — Progress Notes (Signed)
Daily Session Note  Patient Details  Name: Phillip Chavez MRN: [id number] Date of Birth: [birth year] Referring Provider:   Flowsheet Row Pulmonary Rehab from 11/22/2019 in [facility] Cardiac and Pulmonary Rehab  Referring Provider [name] [place]      Encounter Date: 02/13/2020  Check In:  Session Check In - 02/13/20 1547      Check-In   Supervising physician immediately available to respond to emergencies See telemetry face sheet for immediately available ER MD    Location [facility] & Pul[facility] Re[facility]    Staff Present Renita Papa, RN BSN;Joseph Lou [place], [place] Exercise Physiologist;Amanda Oletta Darter, [place], ACSM CEP, Exercise Physiologist    Virtual Visit No    Medication changes reported     No    Fall or balance concerns reported    No    Warm-up and Cool-down Performed on first and last piece of equipment    Resistance Training Performed Yes    VAD Patient? No    PAD/SET Patient? No      Pain Assessment   Currently in Pain? No/denies              Social History   Tobacco Use  Smoking Status Former Smoker  . Packs/day: 1.00  . Years: 20.00  . Pack years: 20.00  . Types: Cigarettes  . Quit date: 03/21/1978  . Years since quitting: 41.9  Smokeless Tobacco Never Used    Goals Met:  Independence with exercise equipment Exercise tolerated well No report of cardiac concerns or symptoms Strength training completed today  Goals Unmet:  Not Applicable  Comments: Pt able to follow exercise prescription today without complaint.  Will continue to monitor for progression.    Dr. Emily Filbert is Medical Director for [facility].

## 2020-02-14 ENCOUNTER — Encounter: Payer: No Typology Code available for payment source | Admitting: *Deleted

## 2020-02-14 ENCOUNTER — Other Ambulatory Visit: Payer: Self-pay

## 2020-02-14 DIAGNOSIS — J449 Chronic obstructive pulmonary disease, unspecified: Secondary | ICD-10-CM

## 2020-02-14 NOTE — Progress Notes (Signed)
Daily Session Note  Patient Details  Name: Phillip Chavez MRN: [id number] Date of Birth: [birth year] Referring Provider:   Flowsheet Row Pulmonary Rehab from 11/22/2019 in [facility] Cardiac and Pulmonary Rehab  Referring Provider [name] [place]      Encounter Date: 02/14/2020  Check In:  Session Check In - 02/14/20 1528      Check-In   Supervising physician immediately available to respond to emergencies See telemetry face sheet for immediately available ER MD    Location [facility] Re[facility]    Staff Present Renita Papa, RN BSN;Joseph [address] [place], [place], [place], CCRP, CCET    Virtual Visit No    Medication changes reported     No    Fall or balance concerns reported    No    Warm-up and Cool-down Performed on first and last piece of equipment    Resistance Training Performed Yes    VAD Patient? No    PAD/SET Patient? No      Pain Assessment   Currently in Pain? No/denies              Social History   Tobacco Use  Smoking Status Former Smoker  . Packs/day: 1.00  . Years: 20.00  . Pack years: 20.00  . Types: Cigarettes  . Quit date: 03/21/1978  . Years since quitting: 41.9  Smokeless Tobacco Never Used    Goals Met:  Independence with exercise equipment Exercise tolerated well No report of cardiac concerns or symptoms Strength training completed today  Goals Unmet:  Not Applicable  Comments: Pt able to follow exercise prescription today without complaint.  Will continue to monitor for progression.    Dr. Emily Filbert is Medical Director for [facility] and [facility].

## 2020-02-18 ENCOUNTER — Other Ambulatory Visit: Payer: Self-pay

## 2020-02-18 DIAGNOSIS — J449 Chronic obstructive pulmonary disease, unspecified: Secondary | ICD-10-CM | POA: Diagnosis not present

## 2020-02-18 NOTE — Progress Notes (Signed)
Daily Session Note  Patient Details  Name: Phillip Chavez MRN: [id number] Date of Birth: [birth year] Referring Provider:   Flowsheet Row Pulmonary Rehab from 11/22/2019 in [facility] Cardiac and Pulmonary Rehab  Referring Provider [name] [place]      Encounter Date: 02/18/2020  Check In:  Session Check In - 02/18/20 [place]      Check-In   Supervising physician immediately available to respond to emergencies See telemetry face sheet for immediately available ER MD    Location [facility]    Staff Present Birdie Sons, MPA, [place], BS, ACSM CEP, Exercise Physiologist;Kara Eliezer Bottom, MS Exercise Physiologist;Amanda Oletta Darter, BA, ACSM CEP, Exercise Physiologist    Virtual Visit No    Medication changes reported     No    Fall or balance concerns reported    No    Tobacco Cessation No Change    Warm-up and Cool-down Performed on first and last piece of equipment    Resistance Training Performed Yes    VAD Patient? No    PAD/SET Patient? No      Pain Assessment   Currently in Pain? No/denies              Social History   Tobacco Use  Smoking Status Former Smoker  . Packs/day: 1.00  . Years: 20.00  . Pack years: 20.00  . Types: Cigarettes  . Quit date: 03/21/1978  . Years since quitting: 41.9  Smokeless Tobacco Never Used    Goals Met:  Independence with exercise equipment Exercise tolerated well No report of cardiac concerns or symptoms Strength training completed today  Goals Unmet:  Not Applicable  Comments: Pt able to follow exercise prescription today without complaint.  Will continue to monitor for progression.    Dr. Emily Filbert is Medical Director for [place] and [facility].

## 2020-02-20 ENCOUNTER — Other Ambulatory Visit: Payer: Self-pay

## 2020-02-20 DIAGNOSIS — J449 Chronic obstructive pulmonary disease, unspecified: Secondary | ICD-10-CM

## 2020-02-20 NOTE — Progress Notes (Signed)
Daily Session Note  Patient Details  Name: DALTEN AMBROSINO MRN: [id number] Date of Birth: 05/07/1940 Referring Provider:   Flowsheet Row Pulmonary Rehab from 11/22/2019 in [facility] Cardiac and Pulmonary Rehab  Referring Provider [name] [place]      Encounter Date: 02/20/2020  Check In:  Session Check In - 02/20/20 1513      Check-In   Supervising physician immediately available to respond to emergencies See telemetry face sheet for immediately available ER MD    Location [facility] & Pul[facility] Re[facility]    Staff Present Birdie Sons, MPA, RN;Jessica Luan Pulling, MA, RCEP, CCRP, CCET;Joseph [place] RCP,RRT,BSRT;Melissa [place] RDN, LDN    Virtual Visit No    Medication changes reported     No    Fall or balance concerns reported    No    Tobacco Cessation No Change    Warm-up and Cool-down Performed on first and last piece of equipment    Resistance Training Performed Yes    VAD Patient? No    PAD/SET Patient? No      Pain Assessment   Currently in Pain? No/denies              Social History   Tobacco Use  Smoking Status Former Smoker  . Packs/day: 1.00  . Years: 20.00  . Pack years: 20.00  . Types: Cigarettes  . Quit date: 03/21/1978  . Years since quitting: 41.9  Smokeless Tobacco Never Used    Goals Met:  Independence with exercise equipment Exercise tolerated well No report of cardiac concerns or symptoms Strength training completed today  Goals Unmet:  Not Applicable  Comments: Pt able to follow exercise prescription today without complaint.  Will continue to monitor for progression.    Dr. Emily Filbert is Medical Director for [place] and [facility].

## 2020-02-21 ENCOUNTER — Encounter: Payer: No Typology Code available for payment source | Admitting: *Deleted

## 2020-02-21 ENCOUNTER — Other Ambulatory Visit: Payer: Self-pay

## 2020-02-21 DIAGNOSIS — J449 Chronic obstructive pulmonary disease, unspecified: Secondary | ICD-10-CM | POA: Diagnosis not present

## 2020-02-21 NOTE — Progress Notes (Signed)
Daily Session Note  Patient Details  Name: Phillip Chavez MRN: [id number] Date of Birth: Apr 04, 1940 Referring Provider:   Flowsheet Row Pulmonary Rehab from 11/22/2019 in [facility] Cardiac and Pulmonary Rehab  Referring Provider [name] [place]      Encounter Date: 02/21/2020  Check In:  Session Check In - 02/21/20 1531      Check-In   Supervising physician immediately available to respond to emergencies See telemetry face sheet for immediately available ER MD    Location [facility] Pul[facility] Re[facility]    Staff Present Renita Papa, RN BSN;Melissa Caiola RDN, LDN;Jessica Luan Pulling, MA, RCEP, CCRP, CCET    Virtual Visit No    Medication changes reported     No    Fall or balance concerns reported    No    Warm-up and Cool-down Performed on first and last piece of equipment    Resistance Training Performed Yes    VAD Patient? No    PAD/SET Patient? No      Pain Assessment   Currently in Pain? No/denies              Social History   Tobacco Use  Smoking Status Former Smoker  . Packs/day: 1.00  . Years: 20.00  . Pack years: 20.00  . Types: Cigarettes  . Quit date: 03/21/1978  . Years since quitting: 41.9  Smokeless Tobacco Never Used    Goals Met:  Independence with exercise equipment Exercise tolerated well No report of cardiac concerns or symptoms Strength training completed today  Goals Unmet:  Not Applicable  Comments: Pt able to follow exercise prescription today without complaint.  Will continue to monitor for progression.    Dr. Emily Filbert is Medical Director for [place] and [facility].

## 2020-02-27 ENCOUNTER — Encounter: Payer: Self-pay | Admitting: *Deleted

## 2020-02-27 DIAGNOSIS — J449 Chronic obstructive pulmonary disease, unspecified: Secondary | ICD-10-CM

## 2020-02-27 NOTE — Progress Notes (Signed)
30 Day review completed. Medical Director ITP review done, changes made as directed, and signed approval by Medical Director.  

## 2020-02-27 NOTE — Progress Notes (Signed)
Pulmonary Individual Treatment Plan  Patient Details  Name: BUFORD BREMER MRN: [id number] Date of Birth: Oct 12, 1940 Referring Provider:   Flowsheet Row Pulmonary Rehab from 11/22/2019 in [facility] Cardiac and Pulmonary Rehab  Referring Provider [name]      Initial Encounter Date:  Flowsheet Row Pulmonary Rehab from 11/22/2019 in [facility] Cardiac and Pulmonary Rehab  Date 11/22/19      Visit Diagnosis: Chronic obstructive pulmonary disease, unspecified COPD type ([place])  Patient's Home Medications on Admission:  Current Outpatient Medications:  .  albuterol (VENTOLIN HFA) 108 (90 Base) MCG/ACT inhaler, Inhale 2 puffs into the lungs 4 (four) times daily as needed for wheezing or shortness of breath., Disp: , Rfl:  .  docusate sodium (COLACE) 100 MG capsule, Take 100 mg by mouth 2 (two) times daily., Disp: , Rfl:  .  Fluticasone-Salmeterol (ADVAIR) 250-50 MCG/DOSE AEPB, Inhale 1 puff into the lungs 2 (two) times daily., Disp: , Rfl:  .  ketotifen (ZADITOR) 0.025 % ophthalmic solution, 1 drop 2 (two) times daily., Disp: , Rfl:  .  losartan (COZAAR) 50 MG tablet, Take 50 mg by mouth daily., Disp: , Rfl:  .  omeprazole (PRILOSEC) 20 MG capsule, Take 20 mg by mouth daily., Disp: , Rfl:  .  pseudoephedrine-acetaminophen (TYLENOL SINUS) 30-500 MG TABS tablet, Take 1 tablet by mouth every 4 (four) hours as needed., Disp: , Rfl:  .  rosuvastatin (CRESTOR) 40 MG tablet, Take 40 mg by mouth daily., Disp: , Rfl:   Past Medical History: No past medical history on file.  Tobacco Use: Social History   Tobacco Use  Smoking Status Former Smoker  . Packs/day: 1.00  . Years: 20.00  . Pack years: 20.00  . Types: Cigarettes  . Quit date: 03/21/1978  . Years since quitting: 41.9  Smokeless Tobacco Never Used    Labs: Recent Review Flowsheet Data   There is no flowsheet data to display.      Pulmonary Assessment Scores:  Pulmonary Assessment Scores    Row Name 11/22/19 1517         ADL UCSD    SOB Score total 41     Rest 1     Walk 2     Stairs 4     Bath 2     Dress 0           CAT Score   CAT Score 16           mMRC Score   mMRC Score 1            UCSD: Self-administered rating of dyspnea associated with activities of daily living (ADLs) 6-point scale (0 = "not at all" to 5 = "maximal or unable to do because of breathlessness")  Scoring Scores range from 0 to 120.  Minimally important difference is 5 units  CAT: CAT can identify the health impairment of COPD patients and is better correlated with disease progression.  CAT has a scoring range of zero to 40. The CAT score is classified into four groups of low (less than 10), medium (10 - 20), high (21-30) and very high (31-40) based on the impact level of disease on health status. A CAT score over 10 suggests significant symptoms.  A worsening CAT score could be explained by an exacerbation, poor medication adherence, poor inhaler technique, or progression of COPD or comorbid conditions.  CAT MCID is 2 points  mMRC: mMRC (Modified Medical Research Council) Dyspnea Scale is used to assess the degree  of baseline functional disability in patients of respiratory disease due to dyspnea. No minimal important difference is established. A decrease in score of 1 point or greater is considered a positive change.   Pulmonary Function Assessment:  Pulmonary Function Assessment - 11/20/19 0837      Initial Spirometry Results   FVC% 81 %    FEV1% 62 %    FEV1/FVC Ratio 59    Comments No post bronchodialator results      Breath   Shortness of Breath Yes;Limiting activity           Exercise Target Goals: Exercise Program Goal: Individual exercise prescription set using results from initial 6 min walk test and THRR while considering  patient's activity barriers and safety.   Exercise Prescription Goal: Initial exercise prescription builds to 30-45 minutes a day of aerobic activity, 2-3 days per week.  Home exercise  guidelines will be given to patient during program as part of exercise prescription that the participant will acknowledge.  Education: Aerobic Exercise: - Group verbal and visual presentation on the components of exercise prescription. Introduces F.I.T.T principle from ACSM for exercise prescriptions.  Reviews F.I.T.T. principles of aerobic exercise including progression. Written material given at graduation. Flowsheet Row Pulmonary Rehab from 02/20/2020 in [facility] Cardiac and Pulmonary Rehab  Date 12/19/19  Educator AS  Instruction Review Code 1- Verbalizes Understanding      Education: Resistance Exercise: - Group verbal and visual presentation on the components of exercise prescription. Introduces F.I.T.T principle from ACSM for exercise prescriptions  Reviews F.I.T.T. principles of resistance exercise including progression. Written material given at graduation. Flowsheet Row Pulmonary Rehab from 02/20/2020 in [facility] Cardiac and Pulmonary Rehab  Date 02/20/20  Educator AS  Instruction Review Code 1- Verbalizes Understanding       Education: Exercise & Equipment Safety: - Individual verbal instruction and demonstration of equipment use and safety with use of the equipment. Flowsheet Row Pulmonary Rehab from 02/20/2020 in [facility] Cardiac and Pulmonary Rehab  Date 11/22/19  Educator AS  Instruction Review Code 1- Verbalizes Understanding      Education: Exercise Physiology & General Exercise Guidelines: - Group verbal and written instruction with models to review the exercise physiology of the cardiovascular system and associated critical values. Provides general exercise guidelines with specific guidelines to those with heart or lung disease.  Flowsheet Row Pulmonary Rehab from 02/20/2020 in [facility] Cardiac and Pulmonary Rehab  Date 02/13/20  Educator AS  Instruction Review Code 1- Verbalizes Understanding      Education: Flexibility, Balance, Mind/Body Relaxation: - Group verbal and  visual presentation with interactive activity on the components of exercise prescription. Introduces F.I.T.T principle from ACSM for exercise prescriptions. Reviews F.I.T.T. principles of flexibility and balance exercise training including progression. Also discusses the mind body connection.  Reviews various relaxation techniques to help reduce and manage stress (i.e. Deep breathing, progressive muscle relaxation, and visualization). Balance handout provided to take home. Written material given at graduation. Flowsheet Row Pulmonary Rehab from 02/20/2020 in [facility] Cardiac and Pulmonary Rehab  Date 01/02/20  Educator AS  Instruction Review Code 1- Verbalizes Understanding      Activity Barriers & Risk Stratification:   6 Minute Walk:  6 Minute Walk    Row Name 11/22/19 1507         6 Minute Walk   Phase Initial     Distance 1160 feet     Walk Time 6 minutes     # of Rest Breaks 0  MPH 2.2     METS 1.9     RPE 11     Perceived Dyspnea  1     VO2 Peak 6.7     Symptoms No     Resting HR 68 bpm     Resting BP 124/64     Resting Oxygen Saturation  94 %     Exercise Oxygen Saturation  during 6 min walk 88 %     Max Ex. HR 100 bpm     Max Ex. BP 134/64     2 Minute Post BP 108/66           Interval HR   1 Minute HR 86     2 Minute HR 86     3 Minute HR 97     4 Minute HR 100     5 Minute HR 96     6 Minute HR 99     2 Minute Post HR 80     Interval Heart Rate? Yes           Interval Oxygen   Interval Oxygen? Yes     Baseline Oxygen Saturation % 94 %     1 Minute Oxygen Saturation % 91 %     1 Minute Liters of Oxygen 0 L     2 Minute Oxygen Saturation % 88 %     2 Minute Liters of Oxygen 0 L     3 Minute Oxygen Saturation % 89 %     3 Minute Liters of Oxygen 0 L     4 Minute Oxygen Saturation % 88 %     4 Minute Liters of Oxygen 0 L     5 Minute Oxygen Saturation % 89 %     5 Minute Liters of Oxygen 0 L     6 Minute Oxygen Saturation % 88 %     6 Minute Liters  of Oxygen 0 L     2 Minute Post Oxygen Saturation % 93 %     2 Minute Post Liters of Oxygen 0 L           Oxygen Initial Assessment:  Oxygen Initial Assessment - 11/20/19 0836      Home Oxygen   Home Oxygen Device None    Sleep Oxygen Prescription None    Home Exercise Oxygen Prescription None    Home Resting Oxygen Prescription None    Compliance with Home Oxygen Use Yes      Initial 6 min Walk   Oxygen Used None      Program Oxygen Prescription   Program Oxygen Prescription None      Intervention   Short Term Goals To learn and understand importance of monitoring SPO2 with pulse oximeter and demonstrate accurate use of the pulse oximeter.;To learn and understand importance of maintaining oxygen saturations>88%;To learn and demonstrate proper pursed lip breathing techniques or other breathing techniques.;To learn and demonstrate proper use of respiratory medications    Long  Term Goals Verbalizes importance of monitoring SPO2 with pulse oximeter and return demonstration;Maintenance of O2 saturations>88%;Exhibits proper breathing techniques, such as pursed lip breathing or other method taught during program session;Compliance with respiratory medication;Demonstrates proper use of MDI's           Oxygen Re-Evaluation:  Oxygen Re-Evaluation    Row Name 11/26/19 1536 12/20/19 1541 01/17/20 1543 02/11/20 1645       Program Oxygen Prescription   Program Oxygen Prescription None None None None  Home Oxygen   Home Oxygen Device None None None None    Sleep Oxygen Prescription None -- None None    Home Exercise Oxygen Prescription None None None None    Home Resting Oxygen Prescription None None None None    Compliance with Home Oxygen Use Yes Yes Yes Yes         Goals/Expected Outcomes   Short Term Goals To learn and understand importance of monitoring SPO2 with pulse oximeter and demonstrate accurate use of the pulse oximeter.;To learn and understand importance of  maintaining oxygen saturations>88%;To learn and demonstrate proper pursed lip breathing techniques or other breathing techniques.;To learn and demonstrate proper use of respiratory medications To learn and understand importance of monitoring SPO2 with pulse oximeter and demonstrate accurate use of the pulse oximeter.;To learn and understand importance of maintaining oxygen saturations>88% To learn and exhibit compliance with exercise, home and travel O2 prescription To learn and exhibit compliance with exercise, home and travel O2 prescription    Long  Term Goals Verbalizes importance of monitoring SPO2 with pulse oximeter and return demonstration;Maintenance of O2 saturations>88%;Exhibits proper breathing techniques, such as pursed lip breathing or other method taught during program session;Compliance with respiratory medication;Demonstrates proper use of MDI's Verbalizes importance of monitoring SPO2 with pulse oximeter and return demonstration;Maintenance of O2 saturations>88% Exhibits compliance with exercise, home and travel O2 prescription Exhibits compliance with exercise, home and travel O2 prescription    Comments Reviewed PLB technique with pt.  Talked about how it works and it's importance in maintaining their exercise saturations. Informed him why it is important to have a pulse oximeter. Reviewed that oxygen saturations should be 88 percent and above. Patient has a pulse oximeter at home to check his oxygen. Derrich is doing well in [place]. He states that he checks his oxygen at home and also takes his medications routinely. He has no questions about his inhaler use. He knows to be 88 percent and above for his oxygen level. Dennie has been consistent on checking his O2 levels at home. He knows to stop activity if he falls below 88 percent. He still continues to use PLB when needed. He's been feeling great during exercise.    Goals/Expected Outcomes Short: Become more profiecient at using PLB.    Long: Become independent at using PLB. Short: monitor oxygen at home with exertion. Long: maintain oxygen saturations above 88 percent independently. Short: continue to exercise to reduce shortness of breath. Long: maintain exercise to keep shortness of breath at a minimum. --           Oxygen Discharge (Final Oxygen Re-Evaluation):  Oxygen Re-Evaluation - 02/11/20 1645      Program Oxygen Prescription   Program Oxygen Prescription None      Home Oxygen   Home Oxygen Device None    Sleep Oxygen Prescription None    Home Exercise Oxygen Prescription None    Home Resting Oxygen Prescription None    Compliance with Home Oxygen Use Yes      Goals/Expected Outcomes   Short Term Goals To learn and exhibit compliance with exercise, home and travel O2 prescription    Long  Term Goals Exhibits compliance with exercise, home and travel O2 prescription    Comments Nohlan has been consistent on checking his O2 levels at home. He knows to stop activity if he falls below 88 percent. He still continues to use PLB when needed. He's been feeling great during exercise.  Initial Exercise Prescription:  Initial Exercise Prescription - 11/22/19 1500      Date of Initial Exercise RX and Referring Provider   Date 11/22/19    Referring Provider [name]      Treadmill   MPH 1.5    Grade 0.5    Minutes 15    METs 2      Recumbant Bike   Level 1    RPM 60    Minutes 15    METs 2      NuStep   Level 2    SPM 80    Minutes 15    METs 2      T5 Nustep   Level 1    SPM 80    Minutes 15    METs 2      Prescription Details   Frequency (times per week) 3    Duration Progress to 30 minutes of continuous aerobic without signs/symptoms of physical distress      Intensity   THRR 40-80% of Max Heartrate 97-126    Ratings of Perceived Exertion 11-15    Perceived Dyspnea 0-4      Progression   Progression Continue to progress workloads to maintain intensity without  signs/symptoms of physical distress.      Resistance Training   Training Prescription Yes    Weight 3 lb    Reps 10-15           Perform Capillary Blood Glucose checks as needed.  Exercise Prescription Changes:  Exercise Prescription Changes    Row Name 11/22/19 1500 12/04/19 1300 12/20/19 1300 01/01/20 0800 01/14/20 1100     Response to Exercise   Blood Pressure (Admit) 124/64 118/60 108/62 118/64 116/62   Blood Pressure (Exercise) 134/64 156/64 126/70 128/74 128/62   Blood Pressure (Exit) 108/66 122/60 132/62 104/64 124/70   Heart Rate (Admit) 68 bpm 67 bpm 81 bpm 62 bpm 78 bpm   Heart Rate (Exercise) 100 bpm 89 bpm 91 bpm 98 bpm 93 bpm   Heart Rate (Exit) 80 bpm 74 bpm 71 bpm 70 bpm 88 bpm   Oxygen Saturation (Admit) 94 % 91 % 95 % 95 % 90 %   Oxygen Saturation (Exercise) 88 % 94 % 91 % 96 % 89 %   Oxygen Saturation (Exit) 93 % 93 % 94 % 97 % 93 %   Rating of Perceived Exertion (Exercise) _0 Perceived Dyspnea (Exercise) 1 1 0 1 1   Symptoms _1    Duration -- Continue with 30 min of aerobic exercise without signs/symptoms of physical distress. Continue with 30 min of aerobic exercise without signs/symptoms of physical distress. Continue with 30 min of aerobic exercise without signs/symptoms of physical distress. Continue with 30 min of aerobic exercise without signs/symptoms of physical distress.   Intensity -- THRR unchanged THRR unchanged THRR unchanged THRR unchanged     Progression   Progression -- Continue to progress workloads to maintain intensity without signs/symptoms of physical distress. Continue to progress workloads to maintain intensity without signs/symptoms of physical distress. Continue to progress workloads to maintain intensity without signs/symptoms of physical distress. Continue to progress workloads to maintain intensity without signs/symptoms of physical distress.   Average METs -- 1.96 2.1 2.34 2.5     Resistance  Training   Training Prescription -- Yes Yes Yes Yes   Weight -- 3 lb 3 lb 4 lb 4 lb   Reps -- 10-15 10-15  10-15 10-15     Interval Training   Interval Training -- No No No No     Treadmill   MPH -- 1 1.5 1.5 1.8   Grade -- 0 0.5 1 0.5   Minutes -- _0 METs -- 1.77 2.25 2.35 2.35     Recumbant Bike   Level -- 1 -- 2 --   Minutes -- 15 -- 15 --   METs -- 2.2 -- 2.94 --     NuStep   Level -- 1 -- -- --   Minutes -- 15 -- -- --   METs -- 1.9 -- -- --     T5 Nustep   Level -- _1 SPM -- -- 80 80 80   Minutes -- _2 METs -- _3 Row Name 01/21/20 1700 01/29/20 0700 02/11/20 1000 02/25/20 1200       Response to Exercise   Blood Pressure (Admit) -- 132/68 118/64 116/58    Blood Pressure (Exercise) -- 152/66 142/64 126/64    Blood Pressure (Exit) -- 130/70 116/62 102/56    Heart Rate (Admit) -- 57 bpm 67 bpm 63 bpm    Heart Rate (Exercise) -- 84 bpm 94 bpm 105 bpm    Heart Rate (Exit) -- 66 bpm 76 bpm 74 bpm    Oxygen Saturation (Admit) -- 96 % 94 % 95 %    Oxygen Saturation (Exercise) -- 90 % 92 % 89 %    Oxygen Saturation (Exit) -- 94 % 94 % 93 %    Rating of Perceived Exertion (Exercise) -- _4 Perceived Dyspnea (Exercise) -- -- 2 2    Symptoms -- none -- --    Duration -- Continue with 30 min of aerobic exercise without signs/symptoms of physical distress. Continue with 30 min of aerobic exercise without signs/symptoms of physical distress. Continue with 30 min of aerobic exercise without signs/symptoms of physical distress.    Intensity -- THRR unchanged THRR unchanged THRR unchanged         Progression   Progression -- Continue to progress workloads to maintain intensity without signs/symptoms of physical distress. Continue to progress workloads to maintain intensity without signs/symptoms of physical distress. Continue to progress workloads to maintain intensity without signs/symptoms of physical distress.    Average METs -- 2.5  3.3 2.4         Resistance Training   Training Prescription -- Yes Yes Yes    Weight -- 4 lb 5 lb 5 lb    Reps -- 10-15 10-15 10-15         Interval Training   Interval Training -- No No No         Treadmill   MPH -- 1.8 -- 2    Grade -- 0.5 -- 0.5    Minutes -- 15 -- 15    METs -- 2.5 -- 2.67         Recumbant Bike   Level -- -- 2 --    Minutes -- -- 15 --    METs -- -- 3.5 --         NuStep   Level -- -- 2 --    SPM -- -- 80 --    Minutes -- -- 15 --    METs -- -- 3.1 --         REL-XR   Level --  2 -- 2    Watts -- -- -- 50    Minutes -- 15 -- 15    METs -- -- -- 2.2         Home Exercise Plan   Plans to continue exercise at Home (comment) (P)   Treadmill -- -- Home (comment)  Treadmill    Frequency Add 2 additional days to program exercise sessions. (P)   Start with 1 -- -- Add 2 additional days to program exercise sessions.  Start with 1    Initial Home Exercises Provided 01/21/20 (P)  -- -- 01/21/20           Exercise Comments:   Exercise Goals and Review:  Exercise Goals    Row Name 11/22/19 1514             Exercise Goals   Increase Physical Activity Yes       Intervention Provide advice, education, support and counseling about physical activity/exercise needs.;Develop an individualized exercise prescription for aerobic and resistive training based on initial evaluation findings, risk stratification, comorbidities and participant's personal goals.       Expected Outcomes Short Term: Attend rehab on a regular basis to increase amount of physical activity.;Long Term: Add in home exercise to make exercise part of routine and to increase amount of physical activity.;Long Term: Exercising regularly at least 3-5 days a week.       Increase Strength and Stamina Yes       Intervention Provide advice, education, support and counseling about physical activity/exercise needs.;Develop an individualized exercise prescription for aerobic and resistive  training based on initial evaluation findings, risk stratification, comorbidities and participant's personal goals.       Expected Outcomes Short Term: Increase workloads from initial exercise prescription for resistance, speed, and METs.;Short Term: Perform resistance training exercises routinely during rehab and add in resistance training at home;Long Term: Improve cardiorespiratory fitness, muscular endurance and strength as measured by increased METs and functional capacity (6MWT)       Able to understand and use rate of perceived exertion (RPE) scale Yes       Intervention Provide education and explanation on how to use RPE scale       Expected Outcomes Short Term: Able to use RPE daily in rehab to express subjective intensity level;Long Term:  Able to use RPE to guide intensity level when exercising independently       Able to understand and use Dyspnea scale Yes       Intervention Provide education and explanation on how to use Dyspnea scale       Expected Outcomes Short Term: Able to use Dyspnea scale daily in rehab to express subjective sense of shortness of breath during exertion;Long Term: Able to use Dyspnea scale to guide intensity level when exercising independently       Knowledge and understanding of Target Heart Rate Range (THRR) Yes       Intervention Provide education and explanation of THRR including how the numbers were predicted and where they are located for reference       Expected Outcomes Short Term: Able to state/look up THRR;Short Term: Able to use daily as guideline for intensity in rehab;Long Term: Able to use THRR to govern intensity when exercising independently       Able to check pulse independently Yes       Intervention Provide education and demonstration on how to check pulse in carotid and radial arteries.;Review the importance of being able to  check your own pulse for safety during independent exercise       Expected Outcomes Short Term: Able to explain why pulse  checking is important during independent exercise;Long Term: Able to check pulse independently and accurately       Understanding of Exercise Prescription Yes              Exercise Goals Re-Evaluation :  Exercise Goals Re-Evaluation    Row Name 11/26/19 1535 12/04/19 1305 12/20/19 1341 01/01/20 0837 01/14/20 1114     Exercise Goal Re-Evaluation   Exercise Goals Review Increase Physical Activity;Able to understand and use rate of perceived exertion (RPE) scale;Knowledge and understanding of Target Heart Rate Range (THRR);Understanding of Exercise Prescription;Increase Strength and Stamina;Able to understand and use Dyspnea scale;Able to check pulse independently Increase Physical Activity;Increase Strength and Stamina;Understanding of Exercise Prescription Increase Physical Activity;Increase Strength and Stamina;Understanding of Exercise Prescription Increase Physical Activity;Increase Strength and Stamina;Understanding of Exercise Prescription Increase Physical Activity;Increase Strength and Stamina;Understanding of Exercise Prescription   Comments Reviewed RPE and dyspnea scales, THR and program prescription with pt today.  Pt voiced understanding and was given a copy of goals to take home. Abdelaziz is off to a good start in rehab.  He has completed his first four full days of exercise already.  He is already up to 2 METs on the T5 NuStep.  We will continue to monitor his progress. Maynor missed some sessions due to a Covid exposure.  He has just started back.  Staff will monitor progress. Izyk is continuing to do well in rehab. He is now up to 4 lbs for resistance training and has increased his treadmill incline to 1 percent. Will continue to monitor. Kenderick attends consistently and works at [employer]. Oxygen has been in 90s except for once at 89%.  He is slowly improving MET level.  Staff will monitor.progress.   Expected Outcomes Short: Use RPE daily to regulate intensity. Long: Follow program  prescription in THR. Short: Continue to attend rehab regularly  Long: Continue to follow program prescription Short:  get back to regular attendance Long:  improve overall stamina Short: Continue to increase levels on RB Long: Continue to progress overall MET level/endurance Short: continue to attend consistenty Long: improve overall stamina   Row Name 01/21/20 1658 01/29/20 0742 02/06/20 1541 02/11/20 1057 02/11/20 1546     Exercise Goal Re-Evaluation   Exercise Goals Review -- Increase Physical Activity;Increase Strength and Stamina;Understanding of Exercise Prescription Increase Physical Activity;Increase Strength and Stamina;Understanding of Exercise Prescription Increase Physical Activity;Increase Strength and Stamina;Understanding of Exercise Prescription Increase Physical Activity;Increase Strength and Stamina;Understanding of Exercise Prescription   Comments Reviewed home exercise with pt today.  Pt plans to walk on his treadmill at home for exercise.  Reviewed THR, pulse, RPE, sign and symptoms, pulse oximetery and when to call 911 or MD.  Also discussed weather considerations and indoor options.  Pt voiced understanding. Saeed attends consistently.  he has increased speed on TM to 1.8.  Staff will encourage increasing levels on seated machines. Yuan feels his posture is better since doing strength exercise.  He says he is more aware of standing up straight.   He feels the XR works him hard at level 2. Fenton has improved average MET level to 3.3.  He has also increased to 5 lb for strength training.  Staff will monitor progress. Jebadiah continues to exercise at home, he walks on the treadmill on his off days from rehab, usually an extra 2-3 days/week,  for a total of 60 minutes or 2 miles, whatever he finishes first. He uses his pulse oximeter and sees if he drops below 88, to stop and use PLB. 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 His son has a  seated machine that he wants to move to his house to change up modality of exercise,   Expected Outcomes Short: Start with adding in 1 day of exercise at home Long: Be able to exercise independently at home using skills and techniques learned at rehab Short:  increase levels on seated machines Long: improve overall stamina Short : continue to exercise consistently Long: improve overall stamina Short:  maintain consistent attendance Long: continue to improve MET level Short:   [place] Name 02/25/20 1236             Exercise Goal Re-Evaluation   Exercise Goals Review Increase Physical Activity;Increase Strength and Stamina       Comments Semaje has progressed to 2 mph/ .5 incline on TM.  Staff will encourage increasing XR to level 3-4.       Expected Outcomes Short: increase XR Long:  continue to improve stamina              Discharge Exercise Prescription (Final Exercise Prescription Changes):  Exercise Prescription Changes - 02/25/20 1200      Response to Exercise   Blood Pressure (Admit) 116/58    Blood Pressure (Exercise) 126/64    Blood Pressure (Exit) 102/56    Heart Rate (Admit) 63 bpm    Heart Rate (Exercise) 105 bpm    Heart Rate (Exit) 74 bpm    Oxygen Saturation (Admit) 95 %    Oxygen Saturation (Exercise) 89 %    Oxygen Saturation (Exit) 93 %    Rating of Perceived Exertion (Exercise) 14    Perceived Dyspnea (Exercise) 2    Duration Continue with 30 min of aerobic exercise without signs/symptoms of physical distress.    Intensity THRR unchanged      Progression   Progression Continue to progress workloads to maintain intensity without signs/symptoms of physical distress.    Average METs 2.4      Resistance Training   Training Prescription Yes    Weight 5 lb    Reps 10-15      Interval Training   Interval Training No      Treadmill   MPH 2    Grade 0.5    Minutes 15    METs 2.67      REL-XR   Level 2    Watts 50    Minutes 15    METs 2.2      Home  Exercise Plan   Plans to continue exercise at Home (comment)   Treadmill   Frequency Add 2 additional days to program exercise sessions.   Start with 1   Initial Home Exercises Provided 01/21/20           Nutrition:  Target Goals: Understanding of nutrition guidelines, daily intake of sodium <1576m, cholesterol <2066m calories 30% from fat and 7% or less from saturated fats, daily to have 5 or more servings of fruits and vegetables.  Education: All About Nutrition: -Group instruction provided by verbal, written material, interactive activities, discussions, models, and posters to present general guidelines for heart healthy nutrition including fat, fiber, MyPlate, the role of sodium in heart healthy nutrition, utilization of the nutrition label, and utilization of this knowledge for meal planning.  Follow up email sent as well. Written material given at graduation. Flowsheet Row Pulmonary Rehab from 02/20/2020 in [facility] Card[facility] Pul[facility]  Date 01/09/20  Educator [facility]  Instruction Review Code 1- Verbalizes Understanding      Biometrics:  Pre Biometrics - 11/22/19 1514      Pre Biometrics   Height 5' 4.5" (1.638 m)    Weight 182 lb 9.6 oz (82.8 kg)    BMI (Calculated) 30.87    Single Leg Stand 22.35 seconds            Nutrition Therapy Plan and Nutrition Goals:  Nutrition Therapy & Goals - 01/14/20 1540      Nutrition Therapy   Diet Heart healhty, low Na, Pulmonary MNT    Drug/Food Interactions Statins/Certain Fruits    Protein (specify units) 100g    Fiber 30 grams    Whole Grain Foods 3 servings    Saturated Fats 12 max. grams    Fruits and Vegetables 8 servings/day    Sodium 1.5 grams      Personal Nutrition Goals   Nutrition Goal ST: Add protien to lunch such as hard boiled egg, add vegetables to breakfast like peppers and onions to morning eggs LT: Lose weight to 160, limit Na to no more than 1563m/day, limit red meat to 1-2x/week, 8 fruits and vegetables  per day    Comments B: scrambled egg with 1 piece of sausage (small) - coffee (cream and sugar) or almond milk. L: two cheese dogs and sweet tea when out, at home tomato sandwich with sugar free bread (multigrain) and mayo (possibly 2) or soup (canned) - vegetable or chicken noodle. D: roast pork and potatoes last night with carrots and celery. Most of the time he will have pork chop or hamburger. Will sometimes get canned chicken breast or tuna for a chciken or tuna salad. Sometimes with get chicken chomein out to eat. He may have glucerna instead of a meal with almond milk and peanut butter. He reports liking pinto beans as well - will add salt. He tries to limit his CHO - discussed importance of complex CHO.  Discussed heart healthy eating - pt attended education last week on nutrition - reviewed. Discussed low sodium canned products.      Intervention Plan   Intervention Prescribe, educate and counsel regarding individualized specific dietary modifications aiming towards targeted core components such as weight, hypertension, lipid management, diabetes, heart failure and other comorbidities.;Nutrition handout(s) given to patient.    Expected Outcomes Short Term Goal: Understand basic principles of dietary content, such as calories, fat, sodium, cholesterol and nutrients.;Short Term Goal: A plan has been developed with personal nutrition goals set during dietitian appointment.;Long Term Goal: Adherence to prescribed nutrition plan.           Nutrition Assessments:  Nutrition Assessments - 11/22/19 1516      Rate Your Plate Scores   Pre Score 28          MEDIFICTS Score Key:  ?70 Need to make dietary changes   40-70 Heart Healthy Diet  ? 40 Therapeutic Level Cholesterol Diet   Picture Your Plate Scores:  <<54Unhealthy dietary pattern with much room for improvement.  41-50 Dietary pattern unlikely to meet recommendations for good health and room for improvement.  51-60 More  healthful dietary pattern, with some room for improvement.   >60 Healthy dietary pattern, although there may be some specific behaviors that could be improved.   Nutrition Goals Re-Evaluation:  Nutrition Goals Re-Evaluation    [place] Name 02/11/20 1551             Goals   Nutrition Goal ST: Add protien to lunch such as hard boiled egg, add vegetables to breakfast like peppers and onions to morning eggs LT: Lose weight to 160, limit Na to no more than 1563m/day, limit red meat to 1-2x/week, 8 fruits and vegetables per day       Comment Continue  current goals established with RD.NBlanefeels that he doesn't feel hungry as much. Used to weigh 190 lb several months ago, now down to 176 lb. Goal is to hit around 155 lb, and has already spoke with RD on weight goals. He is now adding vegetables at breakfast such as onions and peppers.       Expected Outcome Short: Continue increasing vegetable intake with eggs Long: Maintain overall healthy diet              Nutrition Goals Discharge (Final Nutrition Goals Re-Evaluation):  Nutrition Goals Re-Evaluation - 02/11/20 1551      Goals   Nutrition Goal ST: Add protien to lunch such as hard boiled egg, add vegetables to breakfast like peppers and onions to morning eggs LT: Lose weight to 160, limit Na to no more than 15044mday, limit red meat to 1-2x/week, 8 fruits and vegetables per day    Comment Continue  current goals established with RD.NoTyaneels that he doesn't feel hungry as much. Used to weigh 190 lb several months ago, now down to 176 lb. Goal is to hit around 155 lb, and has already spoke with RD on weight goals. He is now adding vegetables at breakfast such as onions and peppers.    Expected Outcome Short: Continue increasing vegetable intake with eggs Long: Maintain overall healthy diet           Psychosocial: Target Goals: Acknowledge presence or absence of significant depression and/or stress, maximize coping skills, provide  positive support system. Participant is able to verbalize types and ability to use techniques and skills needed for reducing stress and depression.   Education: Stress, Anxiety, and Depression - Group verbal and visual presentation to define topics covered.  Reviews how body is impacted by stress, anxiety, and depression.  Also discusses healthy ways to reduce stress and to treat/manage anxiety and depression.  Written material given at graduation.   Education: Sleep Hygiene -Provides group verbal and written instruction about how sleep can affect your health.  Define sleep hygiene, discuss sleep cycles and impact of sleep habits. Review good sleep hygiene tips.    Initial Review & Psychosocial Screening:  Initial Psych Review & Screening - 11/20/19 0840      Initial Review   Current issues with None Identified      Family Dynamics   Good Support System? Yes    Comments He can look to his wife and son for support. He is willing to stay healthy and has a positive attitude.      Barriers   Psychosocial barriers to participate in program There are no identifiable barriers or psychosocial needs.;The patient should benefit from training in stress management and relaxation.      Screening Interventions   Interventions Encouraged to exercise;To provide support and resources with identified psychosocial needs;Provide feedback about the scores to participant    Expected Outcomes Short Term goal: Utilizing psychosocial counselor, staff and physician to assist with identification of specific Stressors or current issues interfering with healing process.  Setting desired goal for each stressor or current issue identified.;Long Term Goal: Stressors or current issues are controlled or eliminated.;Short Term goal: Identification and review with participant of any Quality of Life or Depression concerns found by scoring the questionnaire.;Long Term goal: The participant improves quality of Life and PHQ9 Scores  as seen by post scores and/or verbalization of changes           Quality of Life Scores:  Scores of 19 and below usually indicate a poorer quality of life in these areas.  A difference of  2-3 points is a clinically meaningful difference.  A difference of 2-3 points in the total score of the Quality of Life Index has been associated with significant improvement in overall quality of life, self-image, physical symptoms, and general health in studies assessing change in quality of life.  PHQ-9: Recent Review Flowsheet Data    Depression screen [facility] 2/9 01/17/2020 11/22/2019   Decreased Interest 0 0   Down, Depressed, Hopeless 0 0   PHQ - 2 Score 0 0   Altered sleeping 0 2    Tired, decreased energy 0 1   Change in appetite 2 2   Feeling bad or failure about yourself  0 0   Trouble concentrating 0 0   Moving slowly or fidgety/restless 0 0   Suicidal thoughts 0 0   PHQ-9 Score 2 5   Difficult doing work/chores Not difficult at all Not difficult at all     Interpretation of Total Score  Total Score Depression Severity:  1-4 = Minimal depression, 5-9 = Mild depression, 10-14 = Moderate depression, 15-19 = Moderately severe depression, 20-27 = Severe depression   Psychosocial Evaluation and Intervention:  Psychosocial Evaluation - 11/20/19 0842      Psychosocial Evaluation & Interventions   Interventions Encouraged to exercise with the program and follow exercise prescription    Comments He can look to his wife and son for support. He is willing to stay healthy and has a positive attitude.    Expected Outcomes Short: Exercise regularly to support mental health and notify staff of any changes. Long: maintain mental health and well being through teaching of rehab or prescribed medications independently.    Continue Psychosocial Services  Follow up required by staff           Psychosocial Re-Evaluation:  Psychosocial Re-Evaluation    [place] Name 12/20/19 1544 01/17/20 1553 02/06/20  1543         Psychosocial Re-Evaluation   Current issues with None Identified None Identified --     Comments Patient reports no issues with their current mental states, sleep, stress, depression or anxiety. Will follow up with patient in a few weeks for any changes. Reviewed patient health questionnaire (PHQ-9) with patient for follow up. Previously, patients score indicated signs/symptoms of depression.  Reviewed to see if patient is improving symptom wise while in program.  Score improved and patient states that it is because he has been exercising and confiding in the [place]. Vishwa feels he is doing better stress wise.  He states he sleeps great.     Expected Outcomes Short: Continue to exercise regularly to support mental health and notify staff of any changes. Long: maintain mental health and well being through teaching of rehab or prescribed medications independently. Short: Continue to attend [facility] regularly for regular exercise and social engagement. Long: Continue to improve symptoms and manage a positive mental state. Short: continue to exercise Long: maintain positive outlook  Interventions Encouraged to attend Pulmonary Rehabilitation for the exercise Encouraged to attend Pulmonary Rehabilitation for the exercise --     Continue Psychosocial Services  Follow up required by staff Follow up required by staff --            Psychosocial Discharge (Final Psychosocial Re-Evaluation):  Psychosocial Re-Evaluation - 02/06/20 1543      Psychosocial Re-Evaluation   Comments Fadel feels he is doing better stress wise.  He states he sleeps great.    Expected Outcomes Short: continue to exercise Long: maintain positive outlook           Education: Education Goals: Education classes will be provided on a weekly basis, covering required topics. Participant will state understanding/return demonstration of topics presented.  Learning Barriers/Preferences:  Learning  Barriers/Preferences - 11/20/19 0837      Learning Barriers/Preferences   Learning Barriers None    Learning Preferences None           General Pulmonary Education Topics:  Infection Prevention: - Provides verbal and written material to individual with discussion of infection control including proper hand washing and proper equipment cleaning during exercise session. Flowsheet Row Pulmonary Rehab from 02/20/2020 in [facility] Cardiac and Pulmonary Rehab  Date 11/22/19  Educator AS  Instruction Review Code 1- Verbalizes Understanding      Falls Prevention: - Provides verbal and written material to individual with discussion of falls prevention and safety. Flowsheet Row Pulmonary Rehab from 02/20/2020 in [facility] Cardiac and Pulmonary Rehab  Date 11/22/19  Educator AS  Instruction Review Code 1- Verbalizes Understanding      Chronic Lung Disease Review: - Group verbal instruction with posters, models, PowerPoint presentations and videos,  to review new updates, new respiratory medications, new advancements in procedures and treatments. Providing information on websites and "800" numbers for continued self-education. Includes information about supplement oxygen, available portable oxygen systems, continuous and intermittent flow rates, oxygen safety, concentrators, and Medicare reimbursement for oxygen. Explanation of Pulmonary Drugs, including class, frequency, complications, importance of spacers, rinsing mouth after steroid MDI's, and proper cleaning methods for nebulizers. Review of basic lung anatomy and physiology related to function, structure, and complications of lung disease. Review of risk factors. Discussion about methods for diagnosing sleep apnea and types of masks and machines for OSA. Includes a review of the use of types of environmental controls: home humidity, furnaces, filters, dust mite/pet prevention, HEPA vacuums. Discussion about weather changes, air quality and the benefits  of nasal washing. Instruction on Warning signs, infection symptoms, calling MD promptly, preventive modes, and value of vaccinations. Review of effective airway clearance, coughing and/or vibration techniques. Emphasizing that all should Create an Action Plan. Written material given at graduation. Flowsheet Row Pulmonary Rehab from 02/20/2020 in [facility] Cardiac and Pulmonary Rehab  Date 11/28/19  Educator [facility]  Instruction Review Code 1- Verbalizes Understanding      AED/CPR: - Group verbal and written instruction with the use of models to demonstrate the basic use of the AED with the basic ABC's of resuscitation.    Anatomy and Cardiac Procedures: - Group verbal and visual presentation and models provide information about basic cardiac anatomy and function. Reviews the testing methods done to diagnose heart disease and the outcomes of the test results. Describes the treatment choices: Medical Management, Angioplasty, or Coronary Bypass Surgery for treating various heart conditions including Myocardial Infarction, Angina, Valve Disease, and Cardiac Arrhythmias.  Written material given at graduation. Flowsheet Row Pulmonary Rehab from 02/20/2020 in [facility] Cardiac and Pulmonary  Rehab  Date 02/20/20  Educator [facility]  Instruction Review Code 1- Verbalizes Understanding      Medication Safety: - Group verbal and visual instruction to review commonly prescribed medications for heart and lung disease. Reviews the medication, class of the drug, and side effects. Includes the steps to properly store meds and maintain the prescription regimen.  Written material given at graduation.   Other: -Provides group and verbal instruction on various topics (see comments)   Knowledge Questionnaire Score:  Knowledge Questionnaire Score - 11/22/19 1516      Knowledge Questionnaire Score   Pre Score 15/18            Core Components/Risk Factors/Patient Goals at Admission:  Personal Goals and Risk Factors at  Admission - 11/22/19 1515      Core Components/Risk Factors/Patient Goals on Admission    Weight Management Yes;Weight Loss    Intervention Weight Management: Provide education and appropriate resources to help participant work on and attain dietary goals.;Weight Management: Develop a combined nutrition and exercise program designed to reach desired caloric intake, while maintaining appropriate intake of nutrient and fiber, sodium and fats, and appropriate energy expenditure required for the weight goal.;Weight Management/Obesity: Establish reasonable short term and long term weight goals.    Admit Weight 182 lb 9.6 oz (82.8 kg)    Goal Weight: Short Term 175 lb (79.4 kg)    Goal Weight: Long Term 170 lb (77.1 kg)    Expected Outcomes Short Term: Continue to assess and modify interventions until short term weight is achieved;Long Term: Adherence to nutrition and physical activity/exercise program aimed toward attainment of established weight goal;Weight Maintenance: Understanding of the daily nutrition guidelines, which includes 25-35% calories from fat, 7% or less cal from saturated fats, less than 243m cholesterol, less than 1.5gm of sodium, & 5 or more servings of fruits and vegetables daily;Weight Loss: Understanding of general recommendations for a balanced deficit meal plan, which promotes 1-2 lb weight loss per week and includes a negative energy balance of [phone] kcal/d;Understanding recommendations for meals to include 15-35% energy as protein, 25-35% energy from fat, 35-60% energy from carbohydrates, less than 2023mof dietary cholesterol, 20-35 gm of total fiber daily;Understanding of distribution of calorie intake throughout the day with the consumption of 4-5 meals/snacks    Improve shortness of breath with ADL's Yes    Intervention Provide education, individualized exercise plan and daily activity instruction to help decrease symptoms of SOB with activities of daily living.    Expected  Outcomes Short Term: Improve cardiorespiratory fitness to achieve a reduction of symptoms when performing ADLs;Long Term: Be able to perform more ADLs without symptoms or delay the onset of symptoms    Hypertension Yes    Intervention Provide education on lifestyle modifcations including regular physical activity/exercise, weight management, moderate sodium restriction and increased consumption of fresh fruit, vegetables, and low fat dairy, alcohol moderation, and smoking cessation.;Monitor prescription use compliance.    Expected Outcomes Short Term: Continued assessment and intervention until BP is < 140/9036mG in hypertensive participants. < 130/18m53m in hypertensive participants with diabetes, heart failure or chronic kidney disease.;Long Term: Maintenance of blood pressure at goal levels.    Lipids Yes    Intervention Provide education and support for participant on nutrition & aerobic/resistive exercise along with prescribed medications to achieve LDL <70mg32mL >40mg.40mExpected Outcomes Short Term: Participant states understanding of desired cholesterol values and is compliant with medications prescribed. Participant is following exercise prescription and  nutrition guidelines.;Long Term: Cholesterol controlled with medications as prescribed, with individualized exercise RX and with personalized nutrition plan. Value goals: LDL < 27m, HDL > 40 mg.           Education:Diabetes - Individual verbal and written instruction to review signs/symptoms of diabetes, desired ranges of glucose level fasting, after meals and with exercise. Acknowledge that pre and post exercise glucose checks will be done for 3 sessions at entry of program.   Know Your Numbers and Heart Failure: - Group verbal and visual instruction to discuss disease risk factors for cardiac and pulmonary disease and treatment options.  Reviews associated critical values for Overweight/Obesity, Hypertension, Cholesterol, and  Diabetes.  Discusses basics of heart failure: signs/symptoms and treatments.  Introduces Heart Failure Zone chart for action plan for heart failure.  Written material given at graduation.   Core Components/Risk Factors/Patient Goals Review:   Goals and Risk Factor Review    Row Name 12/20/19 1543 01/17/20 1546 02/06/20 1538         Core Components/Risk Factors/Patient Goals Review   Personal Goals Review Improve shortness of breath with ADL's Improve shortness of breath with ADL's;Weight Management/Obesity Improve shortness of breath with ADL's;Weight Management/Obesity     Review Spoke to patient about their shortness of breath and what they can do to improve. Patient has been informed of breathing techniques when starting the program. Patient is informed to tell staff if they have had any med changes and that certain meds they are taking or not taking can be causing shortness of breath. NFaustostates that he is doing better with his ADLs and is working to improve them more. He has not been able to lose weight but he feels like he has gained some muscle. NMichaelanthonystates he has "more wind" than before.  He also feels his posture is better since he started exercising.  He has been able to tighten up his belt one notch.     Expected Outcomes Short: Attend [facility] regularly to improve shortness of breath with ADL's. Long: maintain independence with ADL's Short: maintain exercise to improve ADL and lose weight. Long: lose a few pounds in the next few weeks. Short: continue exercise in calss and at home Long: maintain exercise and healthy eating            Core Components/Risk Factors/Patient Goals at Discharge (Final Review):   Goals and Risk Factor Review - 02/06/20 1538      Core Components/Risk Factors/Patient Goals Review   Personal Goals Review Improve shortness of breath with ADL's;Weight Management/Obesity    Review [place] he has "more wind" than before.  He also feels his posture is  better since he started exercising.  He has been able to tighten up his belt one notch.    Expected Outcomes Short: continue exercise in calss and at home Long: maintain exercise and healthy eating           ITP Comments:  ITP Comments    Row Name 11/20/19 0844 11/26/19 1534 12/05/19 0505 01/02/20 0557 01/30/20 0852   ITP Comments Virtual Visit completed. Patient informed on EP and RD appointment and 6 Minute walk test. Patient also informed of patient health questionnaires on My Chart. Patient Verbalizes understanding. Visit diagnosis can be found in Media, [place]!  Patient was oriented to gym and equipment including functions, settings, policies, and procedures.  Patient's individual exercise prescription and treatment plan were reviewed.  All starting workloads  were established based on the results of the 6 minute walk test done at initial orientation visit.  The plan for exercise progression was also introduced and progression will be customized based on patient's performance and goals. 30 Day review completed. Medical Director ITP review done, changes made as directed, and signed approval by Medical Director. 30 Day review completed. Medical Director ITP review done, changes made as directed, and signed approval by Medical Director. 30 Day review completed. Medical Director ITP review done, changes made as directed, and signed approval by Medical Director.   [place] Name 02/27/20 0510           ITP Comments 30 Day review completed. Medical Director ITP review done, changes made as directed, and signed approval by Medical Director.              Comments:

## 2020-02-28 ENCOUNTER — Encounter: Payer: No Typology Code available for payment source | Admitting: *Deleted

## 2020-02-28 ENCOUNTER — Other Ambulatory Visit: Payer: Self-pay

## 2020-02-28 DIAGNOSIS — J449 Chronic obstructive pulmonary disease, unspecified: Secondary | ICD-10-CM | POA: Diagnosis not present

## 2020-02-28 NOTE — Progress Notes (Signed)
Daily Session Note  Patient Details  Name: Phillip Chavez MRN: [id number] Date of Birth: [birth year] Referring Provider:   Flowsheet Row Pulmonary Rehab from 11/22/2019 in [facility] Cardiac and Pulmonary Rehab  Referring Provider [name]      Encounter Date: 02/28/2020  Check In:  Session Check In - 02/28/20 1528      Check-In   Supervising physician immediately available to respond to emergencies See telemetry face sheet for immediately available ER MD    Location [facility]-Cardiac & Pulmonary Rehab    Staff Present Renita Papa, RN BSN;Laureen Owens Shark, BS, RRT, CPFT;Amanda Oletta Darter, BA, ACSM CEP, Exercise Physiologist    Virtual Visit No    Medication changes reported     No    Fall or balance concerns reported    No    Warm-up and Cool-down Performed on first and last piece of equipment    Resistance Training Performed Yes    VAD Patient? No      Pain Assessment   Currently in Pain? No/denies              Social History   Tobacco Use  Smoking Status Former Smoker  . Packs/day: 1.00  . Years: 20.00  . Pack years: 20.00  . Types: Cigarettes  . Quit date: 03/21/1978  . Years since quitting: 41.9  Smokeless Tobacco Never Used    Goals Met:  Independence with exercise equipment Exercise tolerated well No report of cardiac concerns or symptoms Strength training completed today  Goals Unmet:  Not Applicable  Comments: Pt able to follow exercise prescription today without complaint.  Will continue to monitor for progression.    Dr. Emily Filbert is Medical Director for [name] and [facility].

## 2020-03-10 ENCOUNTER — Encounter: Payer: No Typology Code available for payment source | Attending: Internal Medicine

## 2020-03-10 ENCOUNTER — Other Ambulatory Visit: Payer: Self-pay

## 2020-03-10 DIAGNOSIS — J449 Chronic obstructive pulmonary disease, unspecified: Secondary | ICD-10-CM | POA: Insufficient documentation

## 2020-03-10 NOTE — Progress Notes (Signed)
Daily Session Note  Patient Details  Name: Phillip Chavez MRN: [id number] Date of Birth: 10-14-1940 Referring Provider:   Flowsheet Row Pulmonary Rehab from 11/22/2019 in [facility] Cardiac and Pulmonary Rehab  Referring Provider [name] [place]      Encounter Date: 03/10/2020  Check In:  Session Check In - 03/10/20 1530      Check-In   Supervising physician immediately available to respond to emergencies See telemetry face sheet for immediately available ER MD    Location [facility]-Cardiac & Pulmonary Rehab    Staff Present Birdie Sons, MPA, Nino Glow, MS Exercise Physiologist;Kyrsten Deleeuw Amedeo Plenty, BS, ACSM CEP, Exercise Physiologist    Virtual Visit No    Medication changes reported     Yes    Comments stopped using spiriva    Fall or balance concerns reported    No    Tobacco Cessation No Change    Warm-up and Cool-down Performed on first and last piece of equipment    Resistance Training Performed Yes    VAD Patient? No    PAD/SET Patient? No      Pain Assessment   Currently in Pain? No/denies              Social History   Tobacco Use  Smoking Status Former Smoker  . Packs/day: 1.00  . Years: 20.00  . Pack years: 20.00  . Types: Cigarettes  . Quit date: 03/21/1978  . Years since quitting: 42.0  Smokeless Tobacco Never Used    Goals Met:  Independence with exercise equipment Exercise tolerated well No report of cardiac concerns or symptoms Strength training completed today  Goals Unmet:  Not Applicable  Comments: Pt able to follow exercise prescription today without complaint.  Will continue to monitor for progression.   [place] Name 11/22/19 1507 03/10/20 1554       6 Minute Walk   Phase Initial Discharge    Distance 1160 feet 1200 feet    Distance % Change - 3 %    Distance Feet Change - 60 ft    Walk Time 6 minutes 6 minutes    # of Rest Breaks 0 0    MPH 2.2 2.72    METS 1.9 1.9    RPE 11 9    Perceived Dyspnea  1 1    VO2 Peak 6.7 6.64     Symptoms No No    Resting HR 68 bpm 61 bpm    Resting BP 124/64 128/72    Resting Oxygen Saturation  94 % 93 %    Exercise Oxygen Saturation  during 6 min walk 88 % 88 %    Max Ex. HR 100 bpm 91 bpm    Max Ex. BP 134/64 142/64    2 Minute Post BP 108/66 130/70         Interval HR   1 Minute HR 86 81    2 Minute HR 86 88    3 Minute HR 97 91    4 Minute HR 100 86    5 Minute HR 96 92    6 Minute HR 99 92    2 Minute Post HR 80 67    Interval Heart Rate? Yes Yes         Interval Oxygen   Interval Oxygen? Yes Yes    Baseline Oxygen Saturation % 94 % 93 %    1 Minute Oxygen Saturation % 91 % 93 %    1  Minute Liters of Oxygen 0 L 0 L    2 Minute Oxygen Saturation % 88 % 91 %    2 Minute Liters of Oxygen 0 L 0 L    3 Minute Oxygen Saturation % 89 % 88 %    3 Minute Liters of Oxygen 0 L 0 L    4 Minute Oxygen Saturation % 88 % 90 %    4 Minute Liters of Oxygen 0 L 0 L    5 Minute Oxygen Saturation % 89 % 89 %    5 Minute Liters of Oxygen 0 L 0 L    6 Minute Oxygen Saturation % 88 % 89 %    6 Minute Liters of Oxygen 0 L 0 L    2 Minute Post Oxygen Saturation % 93 % 94 %    2 Minute Post Liters of Oxygen 0 L 0 L              Dr. Emily Filbert is Medical Director for [place] and [facility].

## 2020-03-12 ENCOUNTER — Other Ambulatory Visit: Payer: Self-pay

## 2020-03-12 DIAGNOSIS — J449 Chronic obstructive pulmonary disease, unspecified: Secondary | ICD-10-CM

## 2020-03-12 NOTE — Progress Notes (Signed)
Daily Session Note  Patient Details  Name: Phillip Chavez MRN: [id number] Date of Birth: 1940/09/29 Referring Provider:   Flowsheet Row Pulmonary Rehab from 11/22/2019 in [facility] Cardiac and Pulmonary Rehab  Referring Provider [name] [place]      Encounter Date: 03/12/2020  Check In:  Session Check In - 03/12/20 1531      Check-In   Supervising physician immediately available to respond to emergencies See telemetry face sheet for immediately available ER MD    Staff Present Birdie Sons, MPA, Elveria Rising, BA, ACSM CEP, Exercise Physiologist;Kara Eliezer Bottom, MS Exercise Physiologist    Virtual Visit No    Medication changes reported     No    Fall or balance concerns reported    No    Tobacco Cessation No Change    Warm-up and Cool-down Performed on first and last piece of equipment    Resistance Training Performed Yes    VAD Patient? No    PAD/SET Patient? No      Pain Assessment   Currently in Pain? No/denies              Social History   Tobacco Use  Smoking Status Former Smoker  . Packs/day: 1.00  . Years: 20.00  . Pack years: 20.00  . Types: Cigarettes  . Quit date: 03/21/1978  . Years since quitting: 42.0  Smokeless Tobacco Never Used    Goals Met:  Independence with exercise equipment Exercise tolerated well Personal goals reviewed No report of cardiac concerns or symptoms Strength training completed today  Goals Unmet:  Not Applicable  Comments: Pt able to follow exercise prescription today without complaint.  Will continue to monitor for progression.    Dr. Emily Filbert is Medical Director for [place] and [facility].

## 2020-03-13 ENCOUNTER — Encounter: Payer: No Typology Code available for payment source | Admitting: *Deleted

## 2020-03-13 ENCOUNTER — Other Ambulatory Visit: Payer: Self-pay

## 2020-03-13 DIAGNOSIS — J449 Chronic obstructive pulmonary disease, unspecified: Secondary | ICD-10-CM | POA: Diagnosis not present

## 2020-03-13 NOTE — Progress Notes (Signed)
Daily Session Note  Patient Details  Name: Phillip Chavez MRN: [id number] Date of Birth: 03/25/1940 Referring Provider:   Flowsheet Row Pulmonary Rehab from 11/22/2019 in [facility] Cardiac and Pulmonary Rehab  Referring Provider [name] [place]      Encounter Date: 03/13/2020  Check In:  Session Check In - 03/13/20 1625      Check-In   Supervising physician immediately available to respond to emergencies See telemetry face sheet for immediately available ER MD    Location [facility]-Cardiac & Pulmonary Rehab    Staff Present Heath Lark, RN, BSN, CCRP;Jessica [place], MA, RCEP, CCRP, CCET;Joseph Lou Miner, [place] Exercise Physiologist    Virtual Visit No    Medication changes reported     No    Fall or balance concerns reported    No    Warm-up and Cool-down Performed on first and last piece of equipment    Resistance Training Performed Yes    VAD Patient? No    PAD/SET Patient? No      Pain Assessment   Currently in Pain? No/denies              Social History   Tobacco Use  Smoking Status Former Smoker  . Packs/day: 1.00  . Years: 20.00  . Pack years: 20.00  . Types: Cigarettes  . Quit date: 03/21/1978  . Years since quitting: 42.0  Smokeless Tobacco Never Used    Goals Met:  Independence with exercise equipment Exercise tolerated well No report of cardiac concerns or symptoms  Goals Unmet:  Not Applicable  Comments: Pt able to follow exercise prescription today without complaint.  Will continue to monitor for progression.    Dr. Emily Filbert is Medical Director for [place] and [facility].

## 2020-03-19 ENCOUNTER — Other Ambulatory Visit: Payer: Self-pay

## 2020-03-19 DIAGNOSIS — J449 Chronic obstructive pulmonary disease, unspecified: Secondary | ICD-10-CM | POA: Diagnosis not present

## 2020-03-19 NOTE — Progress Notes (Signed)
Daily Session Note  Patient Details  Name: Phillip Chavez MRN: [id number] Date of Birth: 23-Oct-1940 Referring Provider:   Flowsheet Row Pulmonary Rehab from 11/22/2019 in [facility] Cardiac and Pulmonary Rehab  Referring Provider [name] [place]      Encounter Date: 03/19/2020  Check In:  Session Check In - 03/19/20 1544      Check-In   Supervising physician immediately available to respond to emergencies See telemetry face sheet for immediately available ER MD    Location [facility]-Cardiac & Pulmonary Rehab    Staff Present Birdie Sons, MPA, RN;Joseph Lou Miner, [place] Exercise Physiologist    Virtual Visit No    Medication changes reported     Yes    Comments no longer using ciclesonide    Fall or balance concerns reported    No    Tobacco Cessation No Change    Warm-up and Cool-down Performed on first and last piece of equipment    Resistance Training Performed Yes    VAD Patient? No    PAD/SET Patient? No      Pain Assessment   Currently in Pain? No/denies              Social History   Tobacco Use  Smoking Status Former Smoker  . Packs/day: 1.00  . Years: 20.00  . Pack years: 20.00  . Types: Cigarettes  . Quit date: 03/21/1978  . Years since quitting: 42.0  Smokeless Tobacco Never Used    Goals Met:  Independence with exercise equipment Exercise tolerated well No report of cardiac concerns or symptoms Strength training completed today  Goals Unmet:  Not Applicable  Comments: Pt able to follow exercise prescription today without complaint.  Will continue to monitor for progression.    Dr. Emily Filbert is Medical Director for [place] and [facility].

## 2020-03-20 ENCOUNTER — Other Ambulatory Visit: Payer: Self-pay

## 2020-03-20 ENCOUNTER — Encounter: Payer: No Typology Code available for payment source | Admitting: *Deleted

## 2020-03-20 DIAGNOSIS — J449 Chronic obstructive pulmonary disease, unspecified: Secondary | ICD-10-CM | POA: Diagnosis not present

## 2020-03-20 NOTE — Progress Notes (Signed)
Daily Session Note  Patient Details  Name: Phillip Chavez MRN: [id number] Date of Birth: [birth year] Referring Provider:   Flowsheet Row Pulmonary Rehab from 11/22/2019 in [facility] Cardiac and Pulmonary Rehab  Referring Provider [name] [place]      Encounter Date: 03/20/2020  Check In:  Session Check In - 03/20/20 1526      Check-In   Supervising physician immediately available to respond to emergencies See telemetry face sheet for immediately available ER MD    Location [facility]-Cardiac & Pulmonary Rehab    Staff Present Renita Papa, RN BSN;Joseph Lou Miner, [place] Exercise Physiologist    Virtual Visit No    Medication changes reported     Yes    Comments started on respimat    Fall or balance concerns reported    No    Warm-up and Cool-down Performed on first and last piece of equipment    Resistance Training Performed Yes    VAD Patient? No    PAD/SET Patient? No      Pain Assessment   Currently in Pain? No/denies              Social History   Tobacco Use  Smoking Status Former Smoker  . Packs/day: 1.00  . Years: 20.00  . Pack years: 20.00  . Types: Cigarettes  . Quit date: 03/21/1978  . Years since quitting: 42.0  Smokeless Tobacco Never Used    Goals Met:  Independence with exercise equipment Exercise tolerated well No report of cardiac concerns or symptoms Strength training completed today  Goals Unmet:  Not Applicable  Comments: Pt able to follow exercise prescription today without complaint.  Will continue to monitor for progression.    Dr. Emily Filbert is Medical Director for [place] and [facility].

## 2020-03-24 ENCOUNTER — Other Ambulatory Visit: Payer: Self-pay

## 2020-03-24 DIAGNOSIS — J449 Chronic obstructive pulmonary disease, unspecified: Secondary | ICD-10-CM | POA: Diagnosis not present

## 2020-03-24 NOTE — Patient Instructions (Signed)
Discharge Patient Instructions  Patient Details  Name: Phillip Chavez MRN: [id number] Date of Birth: [birth year] Referring Provider:  Felipa Eth, MD   Number of Visits: 31  Reason for Discharge:  Patient reached a stable level of exercise. Patient independent in their exercise. Patient has met program and personal goals.  Smoking History:  Social History   Tobacco Use  Smoking Status Former Smoker  . Packs/day: 1.00  . Years: 20.00  . Pack years: 20.00  . Types: Cigarettes  . Quit date: 03/21/1978  . Years since quitting: 42.0  Smokeless Tobacco Never Used    Diagnosis:  Chronic obstructive pulmonary disease, unspecified COPD type ([place])  Initial Exercise Prescription:  Initial Exercise Prescription - 11/22/19 1500      Date of Initial Exercise RX and Referring Provider   Date 11/22/19    Referring Provider [name]      Treadmill   MPH 1.5    Grade 0.5    Minutes 15    METs 2      Recumbant Bike   Level 1    RPM 60    Minutes 15    METs 2      NuStep   Level 2    SPM 80    Minutes 15    METs 2      T5 Nustep   Level 1    SPM 80    Minutes 15    METs 2      Prescription Details   Frequency (times per week) 3    Duration Progress to 30 minutes of continuous aerobic without signs/symptoms of physical distress      Intensity   THRR 40-80% of Max Heartrate 97-126    Ratings of Perceived Exertion 11-15    Perceived Dyspnea 0-4      Progression   Progression Continue to progress workloads to maintain intensity without signs/symptoms of physical distress.      Resistance Training   Training Prescription Yes    Weight 3 lb    Reps 10-15           Discharge Exercise Prescription (Final Exercise Prescription Changes):  Exercise Prescription Changes - 03/10/20 1700      Response to Exercise   Blood Pressure (Admit) 128/72    Blood Pressure (Exercise) 142/64    Blood Pressure (Exit) 118/66    Heart Rate (Exercise) 92 bpm    Heart  Rate (Exit) 63 bpm    Oxygen Saturation (Admit) 94 %    Oxygen Saturation (Exercise) 90 %    Oxygen Saturation (Exit) 93 %    Rating of Perceived Exertion (Exercise) 13    Perceived Dyspnea (Exercise) 0    Duration Continue with 30 min of aerobic exercise without signs/symptoms of physical distress.    Intensity THRR unchanged      Progression   Progression Continue to progress workloads to maintain intensity without signs/symptoms of physical distress.    Average METs 2.7      Resistance Training   Training Prescription Yes    Weight 5 lb    Reps 10-15      Interval Training   Interval Training No      Treadmill   MPH 2    Grade 0.5    Minutes 15    METs 2.67      Home Exercise Plan   Plans to continue exercise at Home (comment)   Treadmill   Frequency Add 2 additional days to program  exercise sessions.   Start with 1   Initial Home Exercises Provided 01/21/20           Functional Capacity:  6 Minute Walk    Row Name 11/22/19 1507 03/10/20 1554       6 Minute Walk   Phase Initial Discharge    Distance 1160 feet 1200 feet    Distance % Change -- 3 %    Distance Feet Change -- 60 ft    Walk Time 6 minutes 6 minutes    # of Rest Breaks 0 0    MPH 2.2 2.72    METS 1.9 1.9    RPE 11 9    Perceived Dyspnea  1 1    VO2 Peak 6.7 6.64    Symptoms No No    Resting HR 68 bpm 61 bpm    Resting BP 124/64 128/72    Resting Oxygen Saturation  94 % 93 %    Exercise Oxygen Saturation  during 6 min walk 88 % 88 %    Max Ex. HR 100 bpm 91 bpm    Max Ex. BP 134/64 142/64    2 Minute Post BP 108/66 130/70         Interval HR   1 Minute HR 86 81    2 Minute HR 86 88    3 Minute HR 97 91    4 Minute HR 100 86    5 Minute HR 96 92    6 Minute HR 99 92    2 Minute Post HR 80 67    Interval Heart Rate? Yes Yes         Interval Oxygen   Interval Oxygen? Yes Yes    Baseline Oxygen Saturation % 94 % 93 %    1 Minute Oxygen Saturation % 91 % 93 %    1 Minute Liters of  Oxygen 0 L 0 L    2 Minute Oxygen Saturation % 88 % 91 %    2 Minute Liters of Oxygen 0 L 0 L    3 Minute Oxygen Saturation % 89 % 88 %    3 Minute Liters of Oxygen 0 L 0 L    4 Minute Oxygen Saturation % 88 % 90 %    4 Minute Liters of Oxygen 0 L 0 L    5 Minute Oxygen Saturation % 89 % 89 %    5 Minute Liters of Oxygen 0 L 0 L    6 Minute Oxygen Saturation % 88 % 89 %    6 Minute Liters of Oxygen 0 L 0 L    2 Minute Post Oxygen Saturation % 93 % 94 %    2 Minute Post Liters of Oxygen 0 L 0 L           Quality of Life:   Personal Goals: Goals established at orientation with interventions provided to work toward goal.  Personal Goals and Risk Factors at Admission - 11/22/19 1515      Core Components/Risk Factors/Patient Goals on Admission    Weight Management Yes;Weight Loss    Intervention Weight Management: Provide education and appropriate resources to help participant work on and attain dietary goals.;Weight Management: Develop a combined nutrition and exercise program designed to reach desired caloric intake, while maintaining appropriate intake of nutrient and fiber, sodium and fats, and appropriate energy expenditure required for the weight goal.;Weight Management/Obesity: Establish reasonable short term and long term weight goals.  Admit Weight 182 lb 9.6 oz (82.8 kg)    Goal Weight: Short Term 175 lb (79.4 kg)    Goal Weight: Long Term 170 lb (77.1 kg)    Expected Outcomes Short Term: Continue to assess and modify interventions until short term weight is achieved;Long Term: Adherence to nutrition and physical activity/exercise program aimed toward attainment of established weight goal;Weight Maintenance: Understanding of the daily nutrition guidelines, which includes 25-35% calories from fat, 7% or less cal from saturated fats, less than 256m cholesterol, less than 1.5gm of sodium, & 5 or more servings of fruits and vegetables daily;Weight Loss: Understanding of general  recommendations for a balanced deficit meal plan, which promotes 1-2 lb weight loss per week and includes a negative energy balance of [phone] kcal/d;Understanding recommendations for meals to include 15-35% energy as protein, 25-35% energy from fat, 35-60% energy from carbohydrates, less than 2073mof dietary cholesterol, 20-35 gm of total fiber daily;Understanding of distribution of calorie intake throughout the day with the consumption of 4-5 meals/snacks    Improve shortness of breath with ADL's Yes    Intervention Provide education, individualized exercise plan and daily activity instruction to help decrease symptoms of SOB with activities of daily living.    Expected Outcomes Short Term: Improve cardiorespiratory fitness to achieve a reduction of symptoms when performing ADLs;Long Term: Be able to perform more ADLs without symptoms or delay the onset of symptoms    Hypertension Yes    Intervention Provide education on lifestyle modifcations including regular physical activity/exercise, weight management, moderate sodium restriction and increased consumption of fresh fruit, vegetables, and low fat dairy, alcohol moderation, and smoking cessation.;Monitor prescription use compliance.    Expected Outcomes Short Term: Continued assessment and intervention until BP is < 140/9022mG in hypertensive participants. < 130/67m70m in hypertensive participants with diabetes, heart failure or chronic kidney disease.;Long Term: Maintenance of blood pressure at goal levels.    Lipids Yes    Intervention Provide education and support for participant on nutrition & aerobic/resistive exercise along with prescribed medications to achieve LDL <70mg40mL >40mg.63mExpected Outcomes Short Term: Participant states understanding of desired cholesterol values and is compliant with medications prescribed. Participant is following exercise prescription and nutrition guidelines.;Long Term: Cholesterol controlled with  medications as prescribed, with individualized exercise RX and with personalized nutrition plan. Value goals: LDL < 70mg, 74m> 40 mg.            Personal Goals Discharge:  Goals and Risk Factor Review - 03/12/20 1539      Core Components/Risk Factors/Patient Goals Review   Review Phillip Chavez pretty good with breathing.  He can breathe deeper and has less exhaustion with daily activities.    Expected Outcomes Short: continue regular exercise Long:  reach weight goal           Exercise Goals and Review:  Exercise Goals    Row Name 11/22/19 1514             Exercise Goals   Increase Physical Activity Yes       Intervention Provide advice, education, support and counseling about physical activity/exercise needs.;Develop an individualized exercise prescription for aerobic and resistive training based on initial evaluation findings, risk stratification, comorbidities and participant's personal goals.       Expected Outcomes Short Term: Attend rehab on a regular basis to increase amount of physical activity.;Long Term: Add in home exercise to make exercise part of routine and to increase amount of  physical activity.;Long Term: Exercising regularly at least 3-5 days a week.       Increase Strength and Stamina Yes       Intervention Provide advice, education, support and counseling about physical activity/exercise needs.;Develop an individualized exercise prescription for aerobic and resistive training based on initial evaluation findings, risk stratification, comorbidities and participant's personal goals.       Expected Outcomes Short Term: Increase workloads from initial exercise prescription for resistance, speed, and METs.;Short Term: Perform resistance training exercises routinely during rehab and add in resistance training at home;Long Term: Improve cardiorespiratory fitness, muscular endurance and strength as measured by increased METs and functional capacity (6MWT)       Able to  understand and use rate of perceived exertion (RPE) scale Yes       Intervention Provide education and explanation on how to use RPE scale       Expected Outcomes Short Term: Able to use RPE daily in rehab to express subjective intensity level;Long Term:  Able to use RPE to guide intensity level when exercising independently       Able to understand and use Dyspnea scale Yes       Intervention Provide education and explanation on how to use Dyspnea scale       Expected Outcomes Short Term: Able to use Dyspnea scale daily in rehab to express subjective sense of shortness of breath during exertion;Long Term: Able to use Dyspnea scale to guide intensity level when exercising independently       Knowledge and understanding of Target Heart Rate Range (THRR) Yes       Intervention Provide education and explanation of THRR including how the numbers were predicted and where they are located for reference       Expected Outcomes Short Term: Able to state/look up THRR;Short Term: Able to use daily as guideline for intensity in rehab;Long Term: Able to use THRR to govern intensity when exercising independently       Able to check pulse independently Yes       Intervention Provide education and demonstration on how to check pulse in carotid and radial arteries.;Review the importance of being able to check your own pulse for safety during independent exercise       Expected Outcomes Short Term: Able to explain why pulse checking is important during independent exercise;Long Term: Able to check pulse independently and accurately       Understanding of Exercise Prescription Yes              Exercise Goals Re-Evaluation:  Exercise Goals Re-Evaluation    Row Name 11/26/19 1535 12/04/19 1305 12/20/19 1341 01/01/20 0837 01/14/20 1114     Exercise Goal Re-Evaluation   Exercise Goals Review Increase Physical Activity;Able to understand and use rate of perceived exertion (RPE) scale;Knowledge and understanding of  Target Heart Rate Range (THRR);Understanding of Exercise Prescription;Increase Strength and Stamina;Able to understand and use Dyspnea scale;Able to check pulse independently Increase Physical Activity;Increase Strength and Stamina;Understanding of Exercise Prescription Increase Physical Activity;Increase Strength and Stamina;Understanding of Exercise Prescription Increase Physical Activity;Increase Strength and Stamina;Understanding of Exercise Prescription Increase Physical Activity;Increase Strength and Stamina;Understanding of Exercise Prescription   Comments Reviewed RPE and dyspnea scales, THR and program prescription with pt today.  Pt voiced understanding and was given a copy of goals to take home. Phillip Chavez is off to a good start in rehab.  He has completed his first four full days of exercise already.  He is already up to  2 METs on the T5 NuStep.  We will continue to monitor his progress. Phillip Chavez missed some sessions due to a Covid exposure.  He has just started back.  Staff will monitor progress. Phillip Chavez is continuing to do well in rehab. He is now up to 4 lbs for resistance training and has increased his treadmill incline to 1 percent. Will continue to monitor. Phillip Chavez attends consistently and works at [employer]. Oxygen has been in 90s except for once at 89%.  He is slowly improving MET level.  Staff will monitor.progress.   Expected Outcomes Short: Use RPE daily to regulate intensity. Long: Follow program prescription in THR. Short: Continue to attend rehab regularly  Long: Continue to follow program prescription Short:  get back to regular attendance Long:  improve overall stamina Short: Continue to increase levels on RB Long: Continue to progress overall MET level/endurance Short: continue to attend consistenty Long: improve overall stamina   Row Name 01/21/20 1658 01/29/20 0742 02/06/20 1541 02/11/20 1057 02/11/20 1546     Exercise Goal Re-Evaluation   Exercise Goals Review -- Increase Physical  Activity;Increase Strength and Stamina;Understanding of Exercise Prescription Increase Physical Activity;Increase Strength and Stamina;Understanding of Exercise Prescription Increase Physical Activity;Increase Strength and Stamina;Understanding of Exercise Prescription Increase Physical Activity;Increase Strength and Stamina;Understanding of Exercise Prescription   Comments Reviewed home exercise with pt today.  Pt plans to walk on his treadmill at home for exercise.  Reviewed THR, pulse, RPE, sign and symptoms, pulse oximetery and when to call 911 or MD.  Also discussed weather considerations and indoor options.  Pt voiced understanding. Phillip Chavez attends consistently.  he has increased speed on TM to 1.8.  Staff will encourage increasing levels on seated machines. Phillip Chavez feels his posture is better since doing strength exercise.  He says he is more aware of standing up straight.   He feels the XR works him hard at level 2. Phillip Chavez has improved average MET level to 3.3.  He has also increased to 5 lb for strength training.  Staff will monitor progress. Phillip Chavez continues to exercise at home, he walks on the treadmill on his off days from rehab, usually an extra 2-3 days/week, for a total of 60 minutes or 2 miles, whatever he finishes first. He uses his pulse oximeter and sees if he drops below 88, to stop and use PLB. 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 His son has a seated machine that he wants to move to his house to change up modality of exercise,   Expected Outcomes Short: Start with adding in 1 day of exercise at home Long: Be able to exercise independently at home using skills and techniques learned at rehab Short:  increase levels on seated machines Long: improve overall stamina Short : continue to exercise consistently Long: improve overall stamina Short:  maintain consistent attendance Long: continue to improve MET level Short:   Phillip Chavez Name 02/25/20 1236  03/10/20 1744           Exercise Goal Re-Evaluation   Exercise Goals Review Increase Physical Activity;Increase Strength and Stamina Increase Physical Activity;Increase Strength and Stamina      Comments Phillip Chavez has progressed to 2 mph/ .5 incline on TM.  Staff will encourage increasing XR to level 3-4. Phillip Chavez improved walk test by 60 feet.  He will complete LW in 5 more sessions.      Expected Outcomes Short: increase XR Long:  continue to improve stamina Short: complete  LW Long: maintain exercise on his own             Nutrition & Weight - Outcomes:  Pre Biometrics - 11/22/19 1514      Pre Biometrics   Height 5' 4.5" (1.638 m)    Weight 182 lb 9.6 oz (82.8 kg)    BMI (Calculated) 30.87    Single Leg Stand 22.35 seconds           Post Biometrics - 03/10/20 1645       Post  Biometrics   Single Leg Stand 30 seconds           Nutrition:  Nutrition Therapy & Goals - 01/14/20 1540      Nutrition Therapy   Diet Heart healhty, low Na, Pulmonary MNT    Drug/Food Interactions Statins/Certain Fruits    Protein (specify units) 100g    Fiber 30 grams    Whole Grain Foods 3 servings    Saturated Fats 12 max. grams    Fruits and Vegetables 8 servings/day    Sodium 1.5 grams      Personal Nutrition Goals   Nutrition Goal ST: Add protien to lunch such as hard boiled egg, add vegetables to breakfast like peppers and onions to morning eggs LT: Lose weight to 160, limit Na to no more than 1566m/day, limit red meat to 1-2x/week, 8 fruits and vegetables per day    Comments B: scrambled egg with 1 piece of sausage (small) - coffee (cream and sugar) or almond milk. L: two cheese dogs and sweet tea when out, at home tomato sandwich with sugar free bread (multigrain) and mayo (possibly 2) or soup (canned) - vegetable or chicken noodle. D: roast pork and potatoes last night with carrots and celery. Most of the time he will have pork chop or hamburger. Will sometimes get canned chicken breast  or tuna for a chciken or tuna salad. Sometimes with get chicken chomein out to eat. He may have glucerna instead of a meal with almond milk and peanut butter. He reports liking pinto beans as well - will add salt. He tries to limit his CHO - discussed importance of complex CHO.  Discussed heart healthy eating - pt attended education last week on nutrition - reviewed. Discussed low sodium canned products.      Intervention Plan   Intervention Prescribe, educate and counsel regarding individualized specific dietary modifications aiming towards targeted core components such as weight, hypertension, lipid management, diabetes, heart failure and other comorbidities.;Nutrition handout(s) given to patient.    Expected Outcomes Short Term Goal: Understand basic principles of dietary content, such as calories, fat, sodium, cholesterol and nutrients.;Short Term Goal: A plan has been developed with personal nutrition goals set during dietitian appointment.;Long Term Goal: Adherence to prescribed nutrition plan.           Nutrition Discharge:  Nutrition Assessments - 11/22/19 1516      Rate Your Plate Scores   Pre Score 28           Education Questionnaire Score:  Knowledge Questionnaire Score - 11/22/19 1516      Knowledge Questionnaire Score   Pre Score 15/18           Goals reviewed with patient; copy given to patient.

## 2020-03-24 NOTE — Progress Notes (Signed)
Pulmonary Individual Treatment Plan  Patient Details  Name: Phillip Chavez MRN: [id number] Date of Birth: Sep 03, 1940 Referring Provider:   Flowsheet Row Pulmonary Rehab from 11/22/2019 in [facility] Cardiac and Pulmonary Rehab  Referring Provider [name]      Initial Encounter Date:  Flowsheet Row Pulmonary Rehab from 11/22/2019 in [facility] Cardiac and Pulmonary Rehab  Date 11/22/19      Visit Diagnosis: Chronic obstructive pulmonary disease, unspecified COPD type ([place])  Patient's Home Medications on Admission:  Current Outpatient Medications:  .  albuterol (VENTOLIN HFA) 108 (90 Base) MCG/ACT inhaler, Inhale 2 puffs into the lungs 4 (four) times daily as needed for wheezing or shortness of breath., Disp: , Rfl:  .  docusate sodium (COLACE) 100 MG capsule, Take 100 mg by mouth 2 (two) times daily., Disp: , Rfl:  .  Fluticasone-Salmeterol (ADVAIR) 250-50 MCG/DOSE AEPB, Inhale 1 puff into the lungs 2 (two) times daily., Disp: , Rfl:  .  ketotifen (ZADITOR) 0.025 % ophthalmic solution, 1 drop 2 (two) times daily., Disp: , Rfl:  .  losartan (COZAAR) 50 MG tablet, Take 50 mg by mouth daily., Disp: , Rfl:  .  omeprazole (PRILOSEC) 20 MG capsule, Take 20 mg by mouth daily., Disp: , Rfl:  .  pseudoephedrine-acetaminophen (TYLENOL SINUS) 30-500 MG TABS tablet, Take 1 tablet by mouth every 4 (four) hours as needed., Disp: , Rfl:  .  rosuvastatin (CRESTOR) 40 MG tablet, Take 40 mg by mouth daily., Disp: , Rfl:   Past Medical History: No past medical history on file.  Tobacco Use: Social History   Tobacco Use  Smoking Status Former Smoker  . Packs/day: 1.00  . Years: 20.00  . Pack years: 20.00  . Types: Cigarettes  . Quit date: 03/21/1978  . Years since quitting: 42.0  Smokeless Tobacco Never Used    Labs: Recent Review Flowsheet Data   There is no flowsheet data to display.      Pulmonary Assessment Scores:  Pulmonary Assessment Scores    Row Name 11/22/19 1517 03/10/20 1644        ADL UCSD   ADL Phase -- Exit    SOB Score total 41 --    Rest 1 --    Walk 2 --    Stairs 4 --    Bath 2 --    Dress 0 --         CAT Score   CAT Score 16 --         mMRC Score   mMRC Score 1 0           UCSD: Self-administered rating of dyspnea associated with activities of daily living (ADLs) 6-point scale (0 = "not at all" to 5 = "maximal or unable to do because of breathlessness")  Scoring Scores range from 0 to 120.  Minimally important difference is 5 units  CAT: CAT can identify the health impairment of COPD patients and is better correlated with disease progression.  CAT has a scoring range of zero to 40. The CAT score is classified into four groups of low (less than 10), medium (10 - 20), high (21-30) and very high (31-40) based on the impact level of disease on health status. A CAT score over 10 suggests significant symptoms.  A worsening CAT score could be explained by an exacerbation, poor medication adherence, poor inhaler technique, or progression of COPD or comorbid conditions.  CAT MCID is 2 points  mMRC: mMRC (Modified Medical Research Council) Dyspnea Scale is  used to assess the degree of baseline functional disability in patients of respiratory disease due to dyspnea. No minimal important difference is established. A decrease in score of 1 point or greater is considered a positive change.   Pulmonary Function Assessment:  Pulmonary Function Assessment - 11/20/19 0837      Initial Spirometry Results   FVC% 81 %    FEV1% 62 %    FEV1/FVC Ratio 59    Comments No post bronchodialator results      Breath   Shortness of Breath Yes;Limiting activity           Exercise Target Goals: Exercise Program Goal: Individual exercise prescription set using results from initial 6 min walk test and THRR while considering  patient's activity barriers and safety.   Exercise Prescription Goal: Initial exercise prescription builds to 30-45 minutes a day of  aerobic activity, 2-3 days per week.  Home exercise guidelines will be given to patient during program as part of exercise prescription that the participant will acknowledge.  Education: Aerobic Exercise: - Group verbal and visual presentation on the components of exercise prescription. Introduces F.I.T.T principle from ACSM for exercise prescriptions.  Reviews F.I.T.T. principles of aerobic exercise including progression. Written material given at graduation. Flowsheet Row Pulmonary Rehab from 03/19/2020 in [facility] Cardiac and Pulmonary Rehab  Date 12/19/19  Educator AS  Instruction Review Code 1- Verbalizes Understanding      Education: Resistance Exercise: - Group verbal and visual presentation on the components of exercise prescription. Introduces F.I.T.T principle from ACSM for exercise prescriptions  Reviews F.I.T.T. principles of resistance exercise including progression. Written material given at graduation. Flowsheet Row Pulmonary Rehab from 03/19/2020 in [facility] Cardiac and Pulmonary Rehab  Date 02/20/20  Educator AS  Instruction Review Code 1- Verbalizes Understanding       Education: Exercise & Equipment Safety: - Individual verbal instruction and demonstration of equipment use and safety with use of the equipment. Flowsheet Row Pulmonary Rehab from 03/19/2020 in [facility] Cardiac and Pulmonary Rehab  Date 11/22/19  Educator AS  Instruction Review Code 1- Verbalizes Understanding      Education: Exercise Physiology & General Exercise Guidelines: - Group verbal and written instruction with models to review the exercise physiology of the cardiovascular system and associated critical values. Provides general exercise guidelines with specific guidelines to those with heart or lung disease.  Flowsheet Row Pulmonary Rehab from 03/19/2020 in [facility] Cardiac and Pulmonary Rehab  Date 02/13/20  Educator AS  Instruction Review Code 1- Verbalizes Understanding      Education: Flexibility,  Balance, Mind/Body Relaxation: - Group verbal and visual presentation with interactive activity on the components of exercise prescription. Introduces F.I.T.T principle from ACSM for exercise prescriptions. Reviews F.I.T.T. principles of flexibility and balance exercise training including progression. Also discusses the mind body connection.  Reviews various relaxation techniques to help reduce and manage stress (i.e. Deep breathing, progressive muscle relaxation, and visualization). Balance handout provided to take home. Written material given at graduation. Flowsheet Row Pulmonary Rehab from 03/19/2020 in [facility] Cardiac and Pulmonary Rehab  Date 01/02/20  Educator AS  Instruction Review Code 1- Verbalizes Understanding      Activity Barriers & Risk Stratification:   6 Minute Walk:  6 Minute Walk    Row Name 11/22/19 1507 03/10/20 1554       6 Minute Walk   Phase Initial Discharge    Distance 1160 feet 1200 feet    Distance % Change -- 3 %    Distance  Feet Change -- 60 ft    Walk Time 6 minutes 6 minutes    # of Rest Breaks 0 0    MPH 2.2 2.72    METS 1.9 1.9    RPE 11 9    Perceived Dyspnea  1 1    VO2 Peak 6.7 6.64    Symptoms No No    Resting HR 68 bpm 61 bpm    Resting BP 124/64 128/72    Resting Oxygen Saturation  94 % 93 %    Exercise Oxygen Saturation  during 6 min walk 88 % 88 %    Max Ex. HR 100 bpm 91 bpm    Max Ex. BP 134/64 142/64    2 Minute Post BP 108/66 130/70         Interval HR   1 Minute HR 86 81    2 Minute HR 86 88    3 Minute HR 97 91    4 Minute HR 100 86    5 Minute HR 96 92    6 Minute HR 99 92    2 Minute Post HR 80 67    Interval Heart Rate? Yes Yes         Interval Oxygen   Interval Oxygen? Yes Yes    Baseline Oxygen Saturation % 94 % 93 %    1 Minute Oxygen Saturation % 91 % 93 %    1 Minute Liters of Oxygen 0 L 0 L    2 Minute Oxygen Saturation % 88 % 91 %    2 Minute Liters of Oxygen 0 L 0 L    3 Minute Oxygen Saturation % 89 % 88  %    3 Minute Liters of Oxygen 0 L 0 L    4 Minute Oxygen Saturation % 88 % 90 %    4 Minute Liters of Oxygen 0 L 0 L    5 Minute Oxygen Saturation % 89 % 89 %    5 Minute Liters of Oxygen 0 L 0 L    6 Minute Oxygen Saturation % 88 % 89 %    6 Minute Liters of Oxygen 0 L 0 L    2 Minute Post Oxygen Saturation % 93 % 94 %    2 Minute Post Liters of Oxygen 0 L 0 L          Oxygen Initial Assessment:  Oxygen Initial Assessment - 11/20/19 0836      Home Oxygen   Home Oxygen Device None    Sleep Oxygen Prescription None    Home Exercise Oxygen Prescription None    Home Resting Oxygen Prescription None    Compliance with Home Oxygen Use Yes      Initial 6 min Walk   Oxygen Used None      Program Oxygen Prescription   Program Oxygen Prescription None      Intervention   Short Term Goals To learn and understand importance of monitoring SPO2 with pulse oximeter and demonstrate accurate use of the pulse oximeter.;To learn and understand importance of maintaining oxygen saturations>88%;To learn and demonstrate proper pursed lip breathing techniques or other breathing techniques.;To learn and demonstrate proper use of respiratory medications    Long  Term Goals Verbalizes importance of monitoring SPO2 with pulse oximeter and return demonstration;Maintenance of O2 saturations>88%;Exhibits proper breathing techniques, such as pursed lip breathing or other method taught during program session;Compliance with respiratory medication;Demonstrates proper use of MDI's  Oxygen Re-Evaluation:  Oxygen Re-Evaluation    Row Name 11/26/19 1536 12/20/19 1541 01/17/20 1543 02/11/20 1645 03/12/20 1542     Program Oxygen Prescription   Program Oxygen Prescription None None None None None     Home Oxygen   Home Oxygen Device None None None None None   Sleep Oxygen Prescription None -- None None None   Home Exercise Oxygen Prescription None None None None None   Home Resting Oxygen  Prescription None None None None None   Compliance with Home Oxygen Use Yes Yes Yes Yes Yes     Goals/Expected Outcomes   Short Term Goals To learn and understand importance of monitoring SPO2 with pulse oximeter and demonstrate accurate use of the pulse oximeter.;To learn and understand importance of maintaining oxygen saturations>88%;To learn and demonstrate proper pursed lip breathing techniques or other breathing techniques.;To learn and demonstrate proper use of respiratory medications To learn and understand importance of monitoring SPO2 with pulse oximeter and demonstrate accurate use of the pulse oximeter.;To learn and understand importance of maintaining oxygen saturations>88% To learn and exhibit compliance with exercise, home and travel O2 prescription To learn and exhibit compliance with exercise, home and travel O2 prescription To learn and exhibit compliance with exercise, home and travel O2 prescription   Long  Term Goals Verbalizes importance of monitoring SPO2 with pulse oximeter and return demonstration;Maintenance of O2 saturations>88%;Exhibits proper breathing techniques, such as pursed lip breathing or other method taught during program session;Compliance with respiratory medication;Demonstrates proper use of MDI's Verbalizes importance of monitoring SPO2 with pulse oximeter and return demonstration;Maintenance of O2 saturations>88% Exhibits compliance with exercise, home and travel O2 prescription Exhibits compliance with exercise, home and travel O2 prescription Exhibits compliance with exercise, home and travel O2 prescription   Comments Reviewed PLB technique with pt.  Talked about how it works and it's importance in maintaining their exercise saturations. Informed him why it is important to have a pulse oximeter. Reviewed that oxygen saturations should be 88 percent and above. Patient has a pulse oximeter at home to check his oxygen. Phillip Chavez is doing well in [place]. He states  that he checks his oxygen at home and also takes his medications routinely. He has no questions about his inhaler use. He knows to be 88 percent and above for his oxygen level. Phillip Chavez has been consistent on checking his O2 levels at home. He knows to stop activity if he falls below 88 percent. He still Chavez to use PLB when needed. He's been feeling great during exercise. Phillip Chavez monitors oxygen at home - he rests if he gets below 88%.  He practices PLB daily.   Goals/Expected Outcomes Short: Become more profiecient at using PLB.   Long: Become independent at using PLB. Short: monitor oxygen at home with exertion. Long: maintain oxygen saturations above 88 percent independently. Short: continue to exercise to reduce shortness of breath. Long: maintain exercise to keep shortness of breath at a minimum. -- Shot:  continue to practice PLB Long:  maintain exercise          Oxygen Discharge (Final Oxygen Re-Evaluation):  Oxygen Re-Evaluation - 03/12/20 1542      Program Oxygen Prescription   Program Oxygen Prescription None      Home Oxygen   Home Oxygen Device None    Sleep Oxygen Prescription None    Home Exercise Oxygen Prescription None    Home Resting Oxygen Prescription None    Compliance with Home Oxygen Use Yes  Goals/Expected Outcomes   Short Term Goals To learn and exhibit compliance with exercise, home and travel O2 prescription    Long  Term Goals Exhibits compliance with exercise, home and travel O2 prescription    Comments Phillip Chavez monitors oxygen at home - he rests if he gets below 88%.  He practices PLB daily.    Goals/Expected Outcomes Shot:  continue to practice PLB Long:  maintain exercise           Initial Exercise Prescription:  Initial Exercise Prescription - 11/22/19 1500      Date of Initial Exercise RX and Referring Provider   Date 11/22/19    Referring Provider [name]      Treadmill   MPH 1.5    Grade 0.5    Minutes 15    METs 2      Recumbant  Bike   Level 1    RPM 60    Minutes 15    METs 2      NuStep   Level 2    SPM 80    Minutes 15    METs 2      T5 Nustep   Level 1    SPM 80    Minutes 15    METs 2      Prescription Details   Frequency (times per week) 3    Duration Progress to 30 minutes of continuous aerobic without signs/symptoms of physical distress      Intensity   THRR 40-80% of Max Heartrate 97-126    Ratings of Perceived Exertion 11-15    Perceived Dyspnea 0-4      Progression   Progression Continue to progress workloads to maintain intensity without signs/symptoms of physical distress.      Resistance Training   Training Prescription Yes    Weight 3 lb    Reps 10-15           Perform Capillary Blood Glucose checks as needed.  Exercise Prescription Changes:  Exercise Prescription Changes    Row Name 11/22/19 1500 12/04/19 1300 12/20/19 1300 01/01/20 0800 01/14/20 1100     Response to Exercise   Blood Pressure (Admit) 124/64 118/60 108/62 118/64 116/62   Blood Pressure (Exercise) 134/64 156/64 126/70 128/74 128/62   Blood Pressure (Exit) 108/66 122/60 132/62 104/64 124/70   Heart Rate (Admit) 68 bpm 67 bpm 81 bpm 62 bpm 78 bpm   Heart Rate (Exercise) 100 bpm 89 bpm 91 bpm 98 bpm 93 bpm   Heart Rate (Exit) 80 bpm 74 bpm 71 bpm 70 bpm 88 bpm   Oxygen Saturation (Admit) 94 % 91 % 95 % 95 % 90 %   Oxygen Saturation (Exercise) 88 % 94 % 91 % 96 % 89 %   Oxygen Saturation (Exit) 93 % 93 % 94 % 97 % 93 %   Rating of Perceived Exertion (Exercise) '11 12 13 14 13   ' Perceived Dyspnea (Exercise) 1 1 0 1 1   Symptoms none none none none none   Duration -- Continue with 30 min of aerobic exercise without signs/symptoms of physical distress. Continue with 30 min of aerobic exercise without signs/symptoms of physical distress. Continue with 30 min of aerobic exercise without signs/symptoms of physical distress. Continue with 30 min of aerobic exercise without signs/symptoms of physical distress.    Intensity -- THRR unchanged THRR unchanged THRR unchanged THRR unchanged     Progression   Progression -- Continue to progress workloads to maintain intensity without  signs/symptoms of physical distress. Continue to progress workloads to maintain intensity without signs/symptoms of physical distress. Continue to progress workloads to maintain intensity without signs/symptoms of physical distress. Continue to progress workloads to maintain intensity without signs/symptoms of physical distress.   Average METs -- 1.96 2.1 2.34 2.5     Resistance Training   Training Prescription -- Yes Yes Yes Yes   Weight -- 3 lb 3 lb 4 lb 4 lb   Reps -- 10-15 10-15 10-15 10-15     Interval Training   Interval Training -- No No No No     Treadmill   MPH -- 1 1.5 1.5 1.8   Grade -- 0 0.5 1 0.5   Minutes -- '15 15 15 15   ' METs -- 1.77 2.25 2.35 2.35     Recumbant Bike   Level -- 1 -- 2 --   Minutes -- 15 -- 15 --   METs -- 2.2 -- 2.94 --     NuStep   Level -- 1 -- -- --   Minutes -- 15 -- -- --   METs -- 1.9 -- -- --     T5 Nustep   Level -- '1 2 2 2   ' SPM -- -- 80 80 80   Minutes -- '15 15 15 15   ' METs -- '2 2 2 2   ' Row Name 01/21/20 1700 01/29/20 0700 02/11/20 1000 02/25/20 1200 03/10/20 1700     Response to Exercise   Blood Pressure (Admit) -- 132/68 118/64 116/58 128/72   Blood Pressure (Exercise) -- 152/66 142/64 126/64 142/64   Blood Pressure (Exit) -- 130/70 116/62 102/56 118/66   Heart Rate (Admit) -- 57 bpm 67 bpm 63 bpm --   Heart Rate (Exercise) -- 84 bpm 94 bpm 105 bpm 92 bpm   Heart Rate (Exit) -- 66 bpm 76 bpm 74 bpm 63 bpm   Oxygen Saturation (Admit) -- 96 % 94 % 95 % 94 %   Oxygen Saturation (Exercise) -- 90 % 92 % 89 % 90 %   Oxygen Saturation (Exit) -- 94 % 94 % 93 % 93 %   Rating of Perceived Exertion (Exercise) -- '13 15 14 13   ' Perceived Dyspnea (Exercise) -- -- 2 2 0   Symptoms -- none -- -- --   Duration -- Continue with 30 min of aerobic exercise without signs/symptoms  of physical distress. Continue with 30 min of aerobic exercise without signs/symptoms of physical distress. Continue with 30 min of aerobic exercise without signs/symptoms of physical distress. Continue with 30 min of aerobic exercise without signs/symptoms of physical distress.   Intensity -- THRR unchanged THRR unchanged THRR unchanged THRR unchanged     Progression   Progression -- Continue to progress workloads to maintain intensity without signs/symptoms of physical distress. Continue to progress workloads to maintain intensity without signs/symptoms of physical distress. Continue to progress workloads to maintain intensity without signs/symptoms of physical distress. Continue to progress workloads to maintain intensity without signs/symptoms of physical distress.   Average METs -- 2.5 3.3 2.4 2.7     Resistance Training   Training Prescription -- Yes Yes Yes Yes   Weight -- 4 lb 5 lb 5 lb 5 lb   Reps -- 10-15 10-15 10-15 10-15     Interval Training   Interval Training -- No No No No     Treadmill   MPH -- 1.8 -- 2 2   Grade -- 0.5 -- 0.5 0.5  Minutes -- 15 -- 15 15   METs -- 2.5 -- 2.67 2.67     Recumbant Bike   Level -- -- 2 -- --   Minutes -- -- 15 -- --   METs -- -- 3.5 -- --     NuStep   Level -- -- 2 -- --   SPM -- -- 80 -- --   Minutes -- -- 15 -- --   METs -- -- 3.1 -- --     REL-XR   Level -- 2 -- 2 --   Watts -- -- -- 50 --   Minutes -- 15 -- 15 --   METs -- -- -- 2.2 --     Home Exercise Plan   Plans to continue exercise at Home (comment) (P)   Treadmill -- -- Home (comment)  Treadmill Home (comment)  Treadmill   Frequency Add 2 additional days to program exercise sessions. (P)   Start with 1 -- -- Add 2 additional days to program exercise sessions.  Start with 1 Add 2 additional days to program exercise sessions.  Start with 1   Initial Home Exercises Provided 01/21/20 (P)  -- -- 01/21/20 01/21/20          Exercise Comments:   Exercise Goals and  Review:  Exercise Goals    Row Name 11/22/19 1514             Exercise Goals   Increase Physical Activity Yes       Intervention Provide advice, education, support and counseling about physical activity/exercise needs.;Develop an individualized exercise prescription for aerobic and resistive training based on initial evaluation findings, risk stratification, comorbidities and participant's personal goals.       Expected Outcomes Short Term: Attend rehab on a regular basis to increase amount of physical activity.;Long Term: Add in home exercise to make exercise part of routine and to increase amount of physical activity.;Long Term: Exercising regularly at least 3-5 days a week.       Increase Strength and Stamina Yes       Intervention Provide advice, education, support and counseling about physical activity/exercise needs.;Develop an individualized exercise prescription for aerobic and resistive training based on initial evaluation findings, risk stratification, comorbidities and participant's personal goals.       Expected Outcomes Short Term: Increase workloads from initial exercise prescription for resistance, speed, and METs.;Short Term: Perform resistance training exercises routinely during rehab and add in resistance training at home;Long Term: Improve cardiorespiratory fitness, muscular endurance and strength as measured by increased METs and functional capacity (6MWT)       Able to understand and use rate of perceived exertion (RPE) scale Yes       Intervention Provide education and explanation on how to use RPE scale       Expected Outcomes Short Term: Able to use RPE daily in rehab to express subjective intensity level;Long Term:  Able to use RPE to guide intensity level when exercising independently       Able to understand and use Dyspnea scale Yes       Intervention Provide education and explanation on how to use Dyspnea scale       Expected Outcomes Short Term: Able to use Dyspnea  scale daily in rehab to express subjective sense of shortness of breath during exertion;Long Term: Able to use Dyspnea scale to guide intensity level when exercising independently       Knowledge and understanding of Target Heart Rate Range (THRR) Yes  Intervention Provide education and explanation of THRR including how the numbers were predicted and where they are located for reference       Expected Outcomes Short Term: Able to state/look up THRR;Short Term: Able to use daily as guideline for intensity in rehab;Long Term: Able to use THRR to govern intensity when exercising independently       Able to check pulse independently Yes       Intervention Provide education and demonstration on how to check pulse in carotid and radial arteries.;Review the importance of being able to check your own pulse for safety during independent exercise       Expected Outcomes Short Term: Able to explain why pulse checking is important during independent exercise;Long Term: Able to check pulse independently and accurately       Understanding of Exercise Prescription Yes              Exercise Goals Re-Evaluation :  Exercise Goals Re-Evaluation    Row Name 11/26/19 1535 12/04/19 1305 12/20/19 1341 01/01/20 0837 01/14/20 1114     Exercise Goal Re-Evaluation   Exercise Goals Review Increase Physical Activity;Able to understand and use rate of perceived exertion (RPE) scale;Knowledge and understanding of Target Heart Rate Range (THRR);Understanding of Exercise Prescription;Increase Strength and Stamina;Able to understand and use Dyspnea scale;Able to check pulse independently Increase Physical Activity;Increase Strength and Stamina;Understanding of Exercise Prescription Increase Physical Activity;Increase Strength and Stamina;Understanding of Exercise Prescription Increase Physical Activity;Increase Strength and Stamina;Understanding of Exercise Prescription Increase Physical Activity;Increase Strength and  Stamina;Understanding of Exercise Prescription   Comments Reviewed RPE and dyspnea scales, THR and program prescription with pt today.  Pt voiced understanding and was given a copy of goals to take home. Phillip Chavez is off to a good start in rehab.  He has completed his first four full days of exercise already.  He is already up to 2 METs on the T5 NuStep.  We will continue to monitor his progress. [place] missed some sessions due to a Covid exposure.  He has just started back.  Staff will monitor progress. Phillip Chavez is continuing to do well in rehab. He is now up to 4 lbs for resistance training and has increased his treadmill incline to 1 percent. Will continue to monitor. Phillip Chavez attends consistently and works at [employer]. Oxygen has been in 90s except for once at 89%.  He is slowly improving MET level.  Staff will monitor.progress.   Expected Outcomes Short: Use RPE daily to regulate intensity. Long: Follow program prescription in THR. Short: Continue to attend rehab regularly  Long: Continue to follow program prescription Short:  get back to regular attendance Long:  improve overall stamina Short: Continue to increase levels on RB Long: Continue to progress overall MET level/endurance Short: continue to attend consistenty Long: improve overall stamina   Row Name 01/21/20 1658 01/29/20 0742 02/06/20 1541 02/11/20 1057 02/11/20 1546     Exercise Goal Re-Evaluation   Exercise Goals Review -- Increase Physical Activity;Increase Strength and Stamina;Understanding of Exercise Prescription Increase Physical Activity;Increase Strength and Stamina;Understanding of Exercise Prescription Increase Physical Activity;Increase Strength and Stamina;Understanding of Exercise Prescription Increase Physical Activity;Increase Strength and Stamina;Understanding of Exercise Prescription   Comments Reviewed home exercise with pt today.  Pt plans to walk on his treadmill at home for exercise.  Reviewed THR, pulse, RPE, sign and  symptoms, pulse oximetery and when to call 911 or MD.  Also discussed weather considerations and indoor options.  Pt voiced understanding. Rahmel attends consistently.  he has increased speed on TM to 1.8.  Staff will encourage increasing levels on seated machines. Phillip Chavez feels his posture is better since doing strength exercise.  He says he is more aware of standing up straight.   He feels the XR works him hard at level 2. Phillip Chavez has improved average MET level to 3.3.  He has also increased to 5 lb for strength training.  Staff will monitor progress. Phillip Chavez to exercise at home, he walks on the treadmill on his off days from rehab, usually an extra 2-3 days/week, for a total of 60 minutes or 2 miles, whatever he finishes first. He uses his pulse oximeter and sees if he drops below 88, to stop and use PLB. 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 His son has a seated machine that he wants to move to his house to change up modality of exercise,   Expected Outcomes Short: Start with adding in 1 day of exercise at home Long: Be able to exercise independently at home using skills and techniques learned at rehab Short:  increase levels on seated machines Long: improve overall stamina Short : continue to exercise consistently Long: improve overall stamina Short:  maintain consistent attendance Long: continue to improve MET level Short:   Phillip Chavez Name 02/25/20 1236 03/10/20 1744           Exercise Goal Re-Evaluation   Exercise Goals Review Increase Physical Activity;Increase Strength and Stamina Increase Physical Activity;Increase Strength and Stamina      Comments Phillip Chavez has progressed to 2 mph/ .5 incline on TM.  Staff will encourage increasing XR to level 3-4. Hillard improved walk test by 60 feet.  He will complete LW in 5 more sessions.      Expected Outcomes Short: increase XR Long:  continue to improve stamina Short: complete LW Long: maintain exercise on  his own             Discharge Exercise Prescription (Final Exercise Prescription Changes):  Exercise Prescription Changes - 03/10/20 1700      Response to Exercise   Blood Pressure (Admit) 128/72    Blood Pressure (Exercise) 142/64    Blood Pressure (Exit) 118/66    Heart Rate (Exercise) 92 bpm    Heart Rate (Exit) 63 bpm    Oxygen Saturation (Admit) 94 %    Oxygen Saturation (Exercise) 90 %    Oxygen Saturation (Exit) 93 %    Rating of Perceived Exertion (Exercise) 13    Perceived Dyspnea (Exercise) 0    Duration Continue with 30 min of aerobic exercise without signs/symptoms of physical distress.    Intensity THRR unchanged      Progression   Progression Continue to progress workloads to maintain intensity without signs/symptoms of physical distress.    Average METs 2.7      Resistance Training   Training Prescription Yes    Weight 5 lb    Reps 10-15      Interval Training   Interval Training No      Treadmill   MPH 2    Grade 0.5    Minutes 15    METs 2.67      Home Exercise Plan   Plans to continue exercise at Home (comment)   Treadmill   Frequency Add 2 additional days to program exercise sessions.   Start with 1   Initial Home Exercises Provided 01/21/20  Nutrition:  Target Goals: Understanding of nutrition guidelines, daily intake of sodium <1556m, cholesterol <2086m calories 30% from fat and 7% or less from saturated fats, daily to have 5 or more servings of fruits and vegetables.  Education: All About Nutrition: -Group instruction provided by verbal, written material, interactive activities, discussions, models, and posters to present general guidelines for heart healthy nutrition including fat, fiber, MyPlate, the role of sodium in heart healthy nutrition, utilization of the nutrition label, and utilization of this knowledge for meal planning. Follow up email sent as well. Written material given at graduation. [facility]  Date 01/09/20  [facility]ruction Review Code 1- Verbalizes Understanding      Biometrics:  Pre Biometrics - 11/22/19 1514      Pre Biometrics   Height 5' 4.5" (1.638 m)    Weight 182 lb 9.6 oz (82.8 kg)    BMI (Calculated) 30.87    Single Leg Stand 22.35 seconds           Post Biometrics - 03/10/20 1645       Post  Biometrics   Single Leg Stand 30 seconds           Nutrition Therapy Plan and Nutrition Goals:  Nutrition Therapy & Goals - 01/14/20 1540      Nutrition Therapy   Diet Heart healhty, low Na, Pulmonary MNT    Drug/Food Interactions Statins/Certain Fruits    Protein (specify units) 100g    Fiber 30 grams    Whole Grain Foods 3 servings    Saturated Fats 12 max. grams    Fruits and Vegetables 8 servings/day    Sodium 1.5 grams      Personal Nutrition Goals   Nutrition Goal ST: Add protien to lunch such as hard boiled egg, add vegetables to breakfast like peppers and onions to morning eggs LT: Lose weight to 160, limit Na to no more than 150010may, limit red meat to 1-2x/week, 8 fruits and vegetables per day    Comments B: scrambled egg with 1 piece of sausage (small) - coffee (cream and sugar) or almond milk. L: two cheese dogs and sweet tea when out, at home tomato sandwich with sugar free bread (multigrain) and mayo (possibly 2) or soup (canned) - vegetable or chicken noodle. D: roast pork and potatoes last night with carrots and celery. Most of the time he will have pork chop or hamburger. Will sometimes get canned chicken breast or tuna for a chciken or tuna salad. Sometimes with get chicken chomein out to eat. He may have glucerna instead of a meal with almond milk and peanut butter. He reports liking pinto beans as well - will add salt. He tries to limit his CHO - discussed importance of complex CHO.  Discussed heart healthy eating - pt attended education last week on nutrition - reviewed. Discussed  low sodium canned products.      Intervention Plan   Intervention Prescribe, educate and counsel regarding individualized specific dietary modifications aiming towards targeted core components such as weight, hypertension, lipid management, diabetes, heart failure and other comorbidities.;Nutrition handout(s) given to patient.    Expected Outcomes Short Term Goal: Understand basic principles of dietary content, such as calories, fat, sodium, cholesterol and nutrients.;Short Term Goal: A plan has been developed with personal nutrition goals set during dietitian appointment.;Long Term Goal: Adherence to prescribed nutrition plan.           Nutrition  Assessments:  Nutrition Assessments - 11/22/19 1516      Rate Your Plate Scores   Pre Score 28          MEDIFICTS Score Key:  ?70 Need to make dietary changes   40-70 Heart Healthy Diet  ? 40 Therapeutic Level Cholesterol Diet   Picture Your Plate Scores:  <37 Unhealthy dietary pattern with much room for improvement.  41-50 Dietary pattern unlikely to meet recommendations for good health and room for improvement.  51-60 More healthful dietary pattern, with some room for improvement.   >60 Healthy dietary pattern, although there may be some specific behaviors that could be improved.   Nutrition Goals Re-Evaluation:  Nutrition Goals Re-Evaluation    Keeler Name 02/11/20 1551 03/12/20 1538           Goals   Nutrition Goal ST: Add protien to lunch such as hard boiled egg, add vegetables to breakfast like peppers and onions to morning eggs LT: Lose weight to 160, limit Na to no more than 1565m/day, limit red meat to 1-2x/week, 8 fruits and vegetables per day --      Comment Continue  current goals established with RD.NDeklynfeels that he doesn't feel hungry as much. Used to weigh 190 lb several months ago, now down to 176 lb. Goal is to hit around 155 lb, and has already spoke with RD on weight goals. He is now adding vegetables at  breakfast such as onions and peppers. NRashunhas added vegetables or salsa to his egg at breakfast.  He has a light lunch and "whatever he wants " at dinner.      Expected Outcome Short: Continue increasing vegetable intake with eggs Long: Maintain overall healthy diet Short:  get back to heart healthy diet Long: reach goal weight             Nutrition Goals Discharge (Final Nutrition Goals Re-Evaluation):  Nutrition Goals Re-Evaluation - 03/12/20 1538      Goals   Comment NModestohas added vegetables or salsa to his egg at breakfast.  He has a light lunch and "whatever he wants " at dinner.    Expected Outcome Short:  get back to heart healthy diet Long: reach goal weight           Psychosocial: Target Goals: Acknowledge presence or absence of significant depression and/or stress, maximize coping skills, provide positive support system. Participant is able to verbalize types and ability to use techniques and skills needed for reducing stress and depression.   Education: Stress, Anxiety, and Depression - Group verbal and visual presentation to define topics covered.  Reviews how body is impacted by stress, anxiety, and depression.  Also discusses healthy ways to reduce stress and to treat/manage anxiety and depression.  Written material given at graduation.   Education: Sleep Hygiene -Provides group verbal and written instruction about how sleep can affect your health.  Define sleep hygiene, discuss sleep cycles and impact of sleep habits. Review good sleep hygiene tips.    Initial Review & Psychosocial Screening:  Initial Psych Review & Screening - 11/20/19 0840      Initial Review   Current issues with None Identified      Family Dynamics   Good Support System? Yes    Comments He can look to his wife and son for support. He is willing to stay healthy and has a positive attitude.      Barriers   Psychosocial barriers to participate in program There are  no identifiable barriers  or psychosocial needs.;The patient should benefit from training in stress management and relaxation.      Screening Interventions   Interventions Encouraged to exercise;To provide support and resources with identified psychosocial needs;Provide feedback about the scores to participant    Expected Outcomes Short Term goal: Utilizing psychosocial counselor, staff and physician to assist with identification of specific Stressors or current issues interfering with healing process. Setting desired goal for each stressor or current issue identified.;Long Term Goal: Stressors or current issues are controlled or eliminated.;Short Term goal: Identification and review with participant of any Quality of Life or Depression concerns found by scoring the questionnaire.;Long Term goal: The participant improves quality of Life and PHQ9 Scores as seen by post scores and/or verbalization of changes           Quality of Life Scores:  Scores of 19 and below usually indicate a poorer quality of life in these areas.  A difference of  2-3 points is a clinically meaningful difference.  A difference of 2-3 points in the total score of the Quality of Life Index has been associated with significant improvement in overall quality of life, self-image, physical symptoms, and general health in studies assessing change in quality of life.  PHQ-9: Recent Review Flowsheet Data    Depression screen Phillip Chavez 01/17/2020 11/22/2019   Decreased Interest 0 0   Down, Depressed, Hopeless 0 0   PHQ - 2 Score 0 0   Altered sleeping 0 2    Tired, decreased energy 0 1   Change in appetite 2 2   Feeling bad or failure about yourself  0 0   Trouble concentrating 0 0   Moving slowly or fidgety/restless 0 0   Suicidal thoughts 0 0   PHQ-9 Score 2 5   Difficult doing work/chores Not difficult at all Not difficult at all     Interpretation of Total Score  Total Score Depression Severity:  1-4 = Minimal depression, 5-9 = Mild depression,  10-14 = Moderate depression, 15-19 = Moderately severe depression, 20-27 = Severe depression   Psychosocial Evaluation and Intervention:  Psychosocial Evaluation - 11/20/19 0842      Psychosocial Evaluation & Interventions   Interventions Encouraged to exercise with the program and follow exercise prescription    Comments He can look to his wife and son for support. He is willing to stay healthy and has a positive attitude.    Expected Outcomes Short: Exercise regularly to support mental health and notify staff of any changes. Long: maintain mental health and well being through teaching of rehab or prescribed medications independently.    Continue Psychosocial Services  Follow up required by staff           Psychosocial Re-Evaluation:  Psychosocial Re-Evaluation    Phillip Chavez Name 12/20/19 1544 01/17/20 1553 02/06/20 1543 03/12/20 1541       Psychosocial Re-Evaluation   Current issues with None Identified None Identified -- --    Comments Patient reports no issues with their current mental states, sleep, stress, depression or anxiety. Will follow up with patient in a few weeks for any changes. Reviewed patient health questionnaire (PHQ-9) with patient for follow up. Previously, patients score indicated signs/symptoms of depression.  Reviewed to see if patient is improving symptom wise while in program.  Score improved and patient states that it is because he has been exercising and confiding in the [place]. Coe feels he is doing better stress wise.  He states he  sleeps great. Phillip Chavez has no new stress concerns.  He just got over a cold - had negative Covid test and is getting back to normal    Expected Outcomes Short: Continue to exercise regularly to support mental health and notify staff of any changes. Long: maintain mental health and well being through teaching of rehab or prescribed medications independently. Short: Continue to attend [facility] regularly for regular exercise and social  engagement. Long: Continue to improve symptoms and manage a positive mental state. Short: continue to exercise Long: maintain positive outlook Maintain positive outlook    Interventions Encouraged to attend Pulmonary Rehabilitation for the exercise Encouraged to attend Pulmonary Rehabilitation for the exercise -- --    Continue Psychosocial Services  Follow up required by staff Follow up required by staff -- --           Psychosocial Discharge (Final Psychosocial Re-Evaluation):  Psychosocial Re-Evaluation - 03/12/20 1541      Psychosocial Re-Evaluation   Comments Phillip Chavez has no new stress concerns.  He just got over a cold - had negative Covid test and is getting back to normal    Expected Outcomes Maintain positive outlook           Education: Education Goals: Education classes will be provided on a weekly basis, covering required topics. Participant will state understanding/return demonstration of topics presented.  Learning Barriers/Preferences:  Learning Barriers/Preferences - 11/20/19 0837      Learning Barriers/Preferences   Learning Barriers None    Learning Preferences None           General Pulmonary Education Topics:  Infection Prevention: - Provides verbal and written material to individual with discussion of infection control including proper hand washing and proper equipment cleaning during exercise session. Flowsheet Row Pulmonary Rehab [facility]19/2020 [facility]  Date 11/22/19  Educator AS  Instruction Review Code 1- Verbalizes Understanding      Falls Prevention: - Provides verbal and written material to individual with discussion of falls prevention and safety. Flowsheet Row Pulmonary Rehab from 03/19/2020 in [facility] Cardiac and Pulmonary Rehab  Date 11/22/19  Educator AS  Instruction Review Code 1- Verbalizes Understanding      Chronic Lung Disease Review: - Group verbal instruction with posters, models, PowerPoint  presentations and videos,  to review new updates, new respiratory medications, new advancements in procedures and treatments. Providing information on websites and "800" numbers for continued self-education. Includes information about supplement oxygen, available portable oxygen systems, continuous and intermittent flow rates, oxygen safety, concentrators, and Medicare reimbursement for oxygen. Explanation of Pulmonary Drugs, including class, frequency, complications, importance of spacers, rinsing mouth after steroid MDI's, and proper cleaning methods for nebulizers. Review of basic lung anatomy and physiology related to function, structure, and complications of lung disease. Review of risk factors. Discussion about methods for diagnosing sleep apnea and types of masks and machines for OSA. Includes a review of the use of types of environmental controls: home humidity, furnaces, filters, dust mite/pet prevention, HEPA vacuums. Discussion about weather changes, air quality and the benefits of nasal washing. Instruction on Warning signs, infection symptoms, calling MD promptly, preventive modes, and value of vaccinations. Review of effective airway clearance, coughing and/or vibration techniques. Emphasizing that all should Create an Action Plan. Written material given at graduation. Flowsheet Row Pulmonary Rehab from 03/19/2020 in [facility] Cardiac and Pulmonary Rehab  Date 11/28/19  Educator [facility]  Instruction Review Code 1- Verbalizes Understanding      AED/CPR: - Group verbal  and written instruction with the use of models to demonstrate the basic use of the AED with the basic ABC's of resuscitation.    Anatomy and Cardiac Procedures: - Group verbal and visual presentation and models provide information about basic cardiac anatomy and function. Reviews the testing methods done to diagnose heart disease and the outcomes of the test results. Describes the treatment choices: Medical Management, Angioplasty, or  Coronary Bypass Surgery for treating various heart conditions including Myocardial Infarction, Angina, Valve Disease, and Cardiac Arrhythmias.  Written material given at graduation. Flowsheet Row Pulmonary Rehab from 03/19/2020 in [facility] Cardiac and Pulmonary Rehab  Date 02/20/20  Educator [facility]  Instruction Review Code 1- Verbalizes Understanding      Medication Safety: - Group verbal and visual instruction to review commonly prescribed medications for heart and lung disease. Reviews the medication, class of the drug, and side effects. Includes the steps to properly store meds and maintain the prescription regimen.  Written material given at graduation. Flowsheet Row Pulmonary Rehab from 03/19/2020 in [facility] Cardiac and Pulmonary Rehab  Date 03/19/20  Educator SB  Instruction Review Code 1- Verbalizes Understanding      Other: -Provides group and verbal instruction on various topics (see comments)   Knowledge Questionnaire Score:  Knowledge Questionnaire Score - 11/22/19 1516      Knowledge Questionnaire Score   Pre Score 15/18            Core Components/Risk Factors/Patient Goals at Admission:  Personal Goals and Risk Factors at Admission - 11/22/19 1515      Core Components/Risk Factors/Patient Goals on Admission    Weight Management Yes;Weight Loss    Intervention Weight Management: Provide education and appropriate resources to help participant work on and attain dietary goals.;Weight Management: Develop a combined nutrition and exercise program designed to reach desired caloric intake, while maintaining appropriate intake of nutrient and fiber, sodium and fats, and appropriate energy expenditure required for the weight goal.;Weight Management/Obesity: Establish reasonable short term and long term weight goals.    Admit Weight 182 lb 9.6 oz (82.8 kg)    Goal Weight: Short Term 175 lb (79.4 kg)    Goal Weight: Long Term 170 lb (77.1 kg)    Expected Outcomes Short Term: Continue to  assess and modify interventions until short term weight is achieved;Long Term: Adherence to nutrition and physical activity/exercise program aimed toward attainment of established weight goal;Weight Maintenance: Understanding of the daily nutrition guidelines, which includes 25-35% calories from fat, 7% or less cal from saturated fats, less than 251m cholesterol, less than 1.5gm of sodium, & 5 or more servings of fruits and vegetables daily;Weight Loss: Understanding of general recommendations for a balanced deficit meal plan, which promotes 1-2 lb weight loss per week and includes a negative energy balance of [phone] kcal/d;Understanding recommendations for meals to include 15-35% energy as protein, 25-35% energy from fat, 35-60% energy from carbohydrates, less than 2069mof dietary cholesterol, 20-35 gm of total fiber daily;Understanding of distribution of calorie intake throughout the day with the consumption of 4-5 meals/snacks    Improve shortness of breath with ADL's Yes    Intervention Provide education, individualized exercise plan and daily activity instruction to help decrease symptoms of SOB with activities of daily living.    Expected Outcomes Short Term: Improve cardiorespiratory fitness to achieve a reduction of symptoms when performing ADLs;Long Term: Be able to perform more ADLs without symptoms or delay the onset of symptoms    Hypertension Yes    Intervention  Provide education on lifestyle modifcations including regular physical activity/exercise, weight management, moderate sodium restriction and increased consumption of fresh fruit, vegetables, and low fat dairy, alcohol moderation, and smoking cessation.;Monitor prescription use compliance.    Expected Outcomes Short Term: Continued assessment and intervention until BP is < 140/29m HG in hypertensive participants. < 130/848mHG in hypertensive participants with diabetes, heart failure or chronic kidney disease.;Long Term: Maintenance of  blood pressure at goal levels.    Lipids Yes    Intervention Provide education and support for participant on nutrition & aerobic/resistive exercise along with prescribed medications to achieve LDL <7054mHDL >60m73m  Expected Outcomes Short Term: Participant states understanding of desired cholesterol values and is compliant with medications prescribed. Participant is following exercise prescription and nutrition guidelines.;Long Term: Cholesterol controlled with medications as prescribed, with individualized exercise RX and with personalized nutrition plan. Value goals: LDL < 70mg55mL > 40 mg.           Education:Diabetes - Individual verbal and written instruction to review signs/symptoms of diabetes, desired ranges of glucose level fasting, after meals and with exercise. Acknowledge that pre and post exercise glucose checks will be done for 3 sessions at entry of program.   Know Your Numbers and Heart Failure: - Group verbal and visual instruction to discuss disease risk factors for cardiac and pulmonary disease and treatment options.  Reviews associated critical values for Overweight/Obesity, Hypertension, Cholesterol, and Diabetes.  Discusses basics of heart failure: signs/symptoms and treatments.  Introduces Heart Failure Zone chart for action plan for heart failure.  Written material given at graduation.   Core Components/Risk Factors/Patient Goals Review:   Goals and Risk Factor Review    Row Name 12/20/19 1543 01/17/20 1546 02/06/20 1538 03/12/20 1536 03/12/20 1539     Core Components/Risk Factors/Patient Goals Review   Personal Goals Review Improve shortness of breath with ADL's Improve shortness of breath with ADL's;Weight Management/Obesity Improve shortness of breath with ADL's;Weight Management/Obesity Weight Management/Obesity;Improve shortness of breath with ADL's --   Review Spoke to patient about their shortness of breath and what they can do to improve. Patient has been  informed of breathing techniques when starting the program. Patient is informed to tell staff if they have had any med changes and that certain meds they are taking or not taking can be causing shortness of breath. Phillip Chavez that he is doing better with his ADLs and is working to improve them more. He has not been able to lose weight but he feels like he has gained some muscle. Phillip Chavez he has "more wind" than before.  He also feels his posture is better since he started exercising.  He has been able to tighten up his belt one notch. Phillip Chavez some weight since the holidays.  He says he will stop eating cake to get back on track. NormaRomondeling pretty good with breathing.  He can breathe deeper and has less exhaustion with daily activities.   Expected Outcomes Short: Attend [facility] regularly to improve shortness of breath with ADL's. Long: maintain independence with ADL's Short: maintain exercise to improve ADL and lose weight. Long: lose a few pounds in the next few weeks. Short: continue exercise in calss and at home Long: maintain exercise and healthy eating -- Short: continue regular exercise Long:  reach weight goal          Core Components/Risk Factors/Patient Goals at Discharge (Final Review):   Goals and Risk Factor Review - 03/12/20  1539      Core Components/Risk Factors/Patient Goals Review   Review Kendrick is feling pretty good with breathing.  He can breathe deeper and has less exhaustion with daily activities.    Expected Outcomes Short: continue regular exercise Long:  reach weight goal           ITP Comments:  ITP Comments    Row Name 11/20/19 0844 11/26/19 1534 12/05/19 0505 01/02/20 0557 01/30/20 0852   ITP Comments Virtual Visit completed. Patient informed on EP and RD appointment and 6 Minute walk test. Patient also informed of patient health questionnaires on My Chart. Patient Verbalizes understanding. Visit diagnosis can be found in Media, patient is [place].  First full day of exercise!  Patient was oriented to gym and equipment including functions, settings, policies, and procedures.  Patient's individual exercise prescription and treatment plan were reviewed.  All starting workloads were established based on the results of the 6 minute walk test done at initial orientation visit.  The plan for exercise progression was also introduced and progression will be customized based on patient's performance and goals. 30 Day review completed. Medical Director ITP review done, changes made as directed, and signed approval by Medical Director. 30 Day review completed. Medical Director ITP review done, changes made as directed, and signed approval by Medical Director. 30 Day review completed. Medical Director ITP review done, changes made as directed, and signed approval by Medical Director.   [place] Name 02/27/20 0510 03/24/20 1548         ITP Comments 30 Day review completed. Medical Director ITP review done, changes made as directed, and signed approval by Medical Director. Dj graduated today from  rehab with 36 sessions completed.  Details of the patient's exercise prescription and what He needs to do in order to continue the prescription and progress were discussed with patient.  Patient was given a copy of prescription and goals.  Patient verbalized understanding.  Dnaiel plans to continue to exercise by joining [employer] to continue exercising.             Comments: discharge ITP

## 2020-03-24 NOTE — Progress Notes (Signed)
Discharge Progress Report  Patient Details  Name: Phillip Chavez MRN: [id number] Date of Birth: 1940/10/03 Referring Provider:   Flowsheet Row Pulmonary Rehab from 11/22/2019 in [facility]  Referring Provider [name]       Number of Visits: 62  Reason for Discharge:  Patient reached a stable level of exercise. Patient independent in their exercise. Patient has met program and personal goals.  Smoking History:  Social History   Tobacco Use  Smoking Status Former Smoker  . Packs/day: 1.00  . Years: 20.00  . Pack years: 20.00  . Types: Cigarettes  . Quit date: 03/21/1978  . Years since quitting: 42.0  Smokeless Tobacco Never Used    Diagnosis:  Chronic obstructive pulmonary disease, unspecified COPD type ([place])  ADL UCSD:  Pulmonary Assessment Scores    Row Name 11/22/19 1517 03/10/20 1644       ADL UCSD   ADL Phase -- Exit    SOB Score total 41 --    Rest 1 --    Walk 2 --    Stairs 4 --    Bath 2 --    Dress 0 --         CAT Score   CAT Score 16 --         mMRC Score   mMRC Score 1 0           Initial Exercise Prescription:  Initial Exercise Prescription - 11/22/19 1500      Date of Initial Exercise RX and Referring Provider   Date 11/22/19    Referring Provider [name]      Treadmill   MPH 1.5    Grade 0.5    Minutes 15    METs 2      Recumbant Bike   Level 1    RPM 60    Minutes 15    METs 2      NuStep   Level 2    SPM 80    Minutes 15    METs 2      T5 Nustep   Level 1    SPM 80    Minutes 15    METs 2      Prescription Details   Frequency (times per week) 3    Duration Progress to 30 minutes of continuous aerobic without signs/symptoms of physical distress      Intensity   THRR 40-80% of Max Heartrate 97-126    Ratings of Perceived Exertion 11-15    Perceived Dyspnea 0-4      Progression   Progression Continue to progress workloads to maintain intensity without signs/symptoms of physical  distress.      Resistance Training   Training Prescription Yes    Weight 3 lb    Reps 10-15           Discharge Exercise Prescription (Final Exercise Prescription Changes):  Exercise Prescription Changes - 03/10/20 1700      Response to Exercise   Blood Pressure (Admit) 128/72    Blood Pressure (Exercise) 142/64    Blood Pressure (Exit) 118/66    Heart Rate (Exercise) 92 bpm    Heart Rate (Exit) 63 bpm    Oxygen Saturation (Admit) 94 %    Oxygen Saturation (Exercise) 90 %    Oxygen Saturation (Exit) 93 %    Rating of Perceived Exertion (Exercise) 13    Perceived Dyspnea (Exercise) 0    Duration Continue with 30 min of aerobic  exercise without signs/symptoms of physical distress.    Intensity THRR unchanged      Progression   Progression Continue to progress workloads to maintain intensity without signs/symptoms of physical distress.    Average METs 2.7      Resistance Training   Training Prescription Yes    Weight 5 lb    Reps 10-15      Interval Training   Interval Training No      Treadmill   MPH 2    Grade 0.5    Minutes 15    METs 2.67      Home Exercise Plan   Plans to continue exercise at Home (comment)   Treadmill   Frequency Add 2 additional days to program exercise sessions.   Start with 1   Initial Home Exercises Provided 01/21/20           Functional Capacity:  6 Minute Walk    Row Name 11/22/19 1507 03/10/20 1554       6 Minute Walk   Phase Initial Discharge    Distance 1160 feet 1200 feet    Distance % Change -- 3 %    Distance Feet Change -- 60 ft    Walk Time 6 minutes 6 minutes    # of Rest Breaks 0 0    MPH 2.2 2.72    METS 1.9 1.9    RPE 11 9    Perceived Dyspnea  1 1    VO2 Peak 6.7 6.64    Symptoms No No    Resting HR 68 bpm 61 bpm    Resting BP 124/64 128/72    Resting Oxygen Saturation  94 % 93 %    Exercise Oxygen Saturation  during 6 min walk 88 % 88 %    Max Ex. HR 100 bpm 91 bpm    Max Ex. BP 134/64 142/64    2  Minute Post BP 108/66 130/70         Interval HR   1 Minute HR 86 81    2 Minute HR 86 88    3 Minute HR 97 91    4 Minute HR 100 86    5 Minute HR 96 92    6 Minute HR 99 92    2 Minute Post HR 80 67    Interval Heart Rate? Yes Yes         Interval Oxygen   Interval Oxygen? Yes Yes    Baseline Oxygen Saturation % 94 % 93 %    1 Minute Oxygen Saturation % 91 % 93 %    1 Minute Liters of Oxygen 0 L 0 L    2 Minute Oxygen Saturation % 88 % 91 %    2 Minute Liters of Oxygen 0 L 0 L    3 Minute Oxygen Saturation % 89 % 88 %    3 Minute Liters of Oxygen 0 L 0 L    4 Minute Oxygen Saturation % 88 % 90 %    4 Minute Liters of Oxygen 0 L 0 L    5 Minute Oxygen Saturation % 89 % 89 %    5 Minute Liters of Oxygen 0 L 0 L    6 Minute Oxygen Saturation % 88 % 89 %    6 Minute Liters of Oxygen 0 L 0 L    2 Minute Post Oxygen Saturation % 93 % 94 %    2 Minute Post Liters of  Oxygen 0 L 0 L           Psychological, QOL, Others - Outcomes: PHQ 2/9: Depression screen [facility] 2/9 01/17/2020 11/22/2019  Decreased Interest 0 0  Down, Depressed, Hopeless 0 0  PHQ - 2 Score 0 0  Altered sleeping 0 2  Tired, decreased energy 0 1  Change in appetite 2 2  Feeling bad or failure about yourself  0 0  Trouble concentrating 0 0  Moving slowly or fidgety/restless 0 0  Suicidal thoughts 0 0  PHQ-9 Score 2 5  Difficult doing work/chores Not difficult at all Not difficult at all    Quality of Life:   Nutrition & Weight - Outcomes:  Pre Biometrics - 11/22/19 1514      Pre Biometrics   Height 5' 4.5" (1.638 m)    Weight 182 lb 9.6 oz (82.8 kg)    BMI (Calculated) 30.87    Single Leg Stand 22.35 seconds           Post Biometrics - 03/10/20 1645       Post  Biometrics   Single Leg Stand 30 seconds           Nutrition:  Nutrition Therapy & Goals - 01/14/20 1540      Nutrition Therapy   Diet Heart healhty, low Na, Pulmonary MNT    Drug/Food Interactions Statins/Certain Fruits     Protein (specify units) 100g    Fiber 30 grams    Whole Grain Foods 3 servings    Saturated Fats 12 max. grams    Fruits and Vegetables 8 servings/day    Sodium 1.5 grams      Personal Nutrition Goals   Nutrition Goal ST: Add protien to lunch such as hard boiled egg, add vegetables to breakfast like peppers and onions to morning eggs LT: Lose weight to 160, limit Na to no more than 1569m/day, limit red meat to 1-2x/week, 8 fruits and vegetables per day    Comments B: scrambled egg with 1 piece of sausage (small) - coffee (cream and sugar) or almond milk. L: two cheese dogs and sweet tea when out, at home tomato sandwich with sugar free bread (multigrain) and mayo (possibly 2) or soup (canned) - vegetable or chicken noodle. D: roast pork and potatoes last night with carrots and celery. Most of the time he will have pork chop or hamburger. Will sometimes get canned chicken breast or tuna for a chciken or tuna salad. Sometimes with get chicken chomein out to eat. He may have glucerna instead of a meal with almond milk and peanut butter. He reports liking pinto beans as well - will add salt. He tries to limit his CHO - discussed importance of complex CHO.  Discussed heart healthy eating - pt attended education last week on nutrition - reviewed. Discussed low sodium canned products.      Intervention Plan   Intervention Prescribe, educate and counsel regarding individualized specific dietary modifications aiming towards targeted core components such as weight, hypertension, lipid management, diabetes, heart failure and other comorbidities.;Nutrition handout(s) given to patient.    Expected Outcomes Short Term Goal: Understand basic principles of dietary content, such as calories, fat, sodium, cholesterol and nutrients.;Short Term Goal: A plan has been developed with personal nutrition goals set during dietitian appointment.;Long Term Goal: Adherence to prescribed nutrition plan.           Nutrition  Discharge:  Nutrition Assessments - 11/22/19 1516      Rate Your Plate  Scores   Pre Score 28           Education Questionnaire Score:  Knowledge Questionnaire Score - 11/22/19 1516      Knowledge Questionnaire Score   Pre Score 15/18           Goals reviewed with patient; copy given to patient.

## 2020-03-24 NOTE — Progress Notes (Signed)
Daily Session Note  Patient Details  Name: Phillip Chavez MRN: [id number] Date of Birth: 1940-11-12 Referring Provider:   Flowsheet Row Pulmonary Rehab from 11/22/2019 in [facility]  Referring Provider [name] [place]      Encounter Date: 03/24/2020  Check In:  Session Check In - 03/24/20 1541      Check-In   Supervising physician immediately available to respond to emergencies See telemetry face sheet for immediately available ER MD    Location [facility]-Cardiac & Pulmonary Rehab    Staff Present Earlean Shawl, BS, ACSM CEP, Exercise Physiologist;Kara Eliezer Bottom, MS Exercise Physiologist;Sela Falk Rosalia Hammers, MPA, RN    Virtual Visit No    Medication changes reported     No    Fall or balance concerns reported    No    Tobacco Cessation No Change    Warm-up and Cool-down Performed on first and last piece of equipment    Resistance Training Performed Yes    VAD Patient? No    PAD/SET Patient? No      Pain Assessment   Currently in Pain? No/denies              Social History   Tobacco Use  Smoking Status Former Smoker  . Packs/day: 1.00  . Years: 20.00  . Pack years: 20.00  . Types: Cigarettes  . Quit date: 03/21/1978  . Years since quitting: 42.0  Smokeless Tobacco Never Used    Goals Met:  Independence with exercise equipment Exercise tolerated well No report of cardiac concerns or symptoms Strength training completed today  Goals Unmet:  Not Applicable  Comments:  Phillip Chavez graduated today from  rehab with 36 sessions completed.  Details of the patient's exercise prescription and what He needs to do in order to continue the prescription and progress were discussed with patient.  Patient was given a copy of prescription and goals.  Patient verbalized understanding.  Phillip Chavez plans to continue to exercise by joining [employer] to continue exercising.     Dr. Emily Filbert is Medical Director for [place] and [facility]  [facility].

## 2020-03-25 ENCOUNTER — Ambulatory Visit (INDEPENDENT_AMBULATORY_CARE_PROVIDER_SITE_OTHER): Payer: No Typology Code available for payment source | Admitting: Podiatry

## 2020-03-25 ENCOUNTER — Encounter: Payer: Self-pay | Admitting: Podiatry

## 2020-03-25 DIAGNOSIS — B351 Tinea unguium: Secondary | ICD-10-CM | POA: Diagnosis not present

## 2020-03-25 DIAGNOSIS — M79675 Pain in left toe(s): Secondary | ICD-10-CM | POA: Diagnosis not present

## 2020-03-25 DIAGNOSIS — M79674 Pain in right toe(s): Secondary | ICD-10-CM | POA: Diagnosis not present

## 2020-03-25 NOTE — Progress Notes (Signed)
  Subjective:  Patient ID: Phillip Chavez, male    DOB: 1940-10-05,  MRN: [id number]  Chief Complaint  Patient presents with  . Nail Problem   80 y.o. male returns for the above complaint.  Patient presents with thickened elongated dystrophic toenails x10.  Patient states is not able to cut him.  He would like for me to debride them down.  He is not a diabetic.  He denies any other acute complaints.  He has not seen anyone else prior to seeing me.  He would like to establish care.  Objective:  There were no vitals filed for this visit. Podiatric Exam: Vascular: dorsalis pedis and posterior tibial pulses are palpable bilateral. Capillary return is immediate. Temperature gradient is WNL. Skin turgor WNL  Sensorium: Normal Semmes Weinstein monofilament test. Normal tactile sensation bilaterally. Nail Exam: Pt has thick disfigured discolored nails with subungual debris noted bilateral entire nail hallux through fifth toenails.  Pain on palpation to the nails. Ulcer Exam: There is no evidence of ulcer or pre-ulcerative changes or infection. Orthopedic Exam: Muscle tone and strength are WNL. No limitations in general ROM. No crepitus or effusions noted. HAV  B/L.  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 Skin: No Porokeratosis. No infection or ulcers    Assessment & Plan:   1. Pain due to onychomycosis of toenails of both feet     Patient was evaluated and treated and all questions answered.  Onychomycosis with pain  -Nails palliatively debrided as below. -Educated on self-care  Procedure: Nail Debridement Rationale: pain  Type of Debridement: manual, sharp debridement. Instrumentation: Nail nipper, rotary burr. Number of Nails: 10  Procedures and Treatment: Consent by patient was obtained for treatment procedures. The patient understood the discussion of treatment and procedures well. All questions were answered thoroughly reviewed. Debridement of mycotic and hypertrophic toenails, 1 through 5 bilateral  and clearing of subungual debris. No ulceration, no infection noted.  Return Visit-Office Procedure: Patient instructed to return to the office for a follow up visit 3 months for continued evaluation and treatment.  Boneta Lucks, DPM    No follow-ups on file.

## 2020-06-24 ENCOUNTER — Ambulatory Visit: Payer: No Typology Code available for payment source | Admitting: Podiatry

## 2021-01-03 ENCOUNTER — Emergency Department: Payer: No Typology Code available for payment source

## 2021-01-03 ENCOUNTER — Other Ambulatory Visit: Payer: Self-pay

## 2021-01-03 ENCOUNTER — Inpatient Hospital Stay
Admission: EM | Admit: 2021-01-03 | Discharge: 2021-01-24 | DRG: 871 | Disposition: A | Discharge status: Skilled Nursing Facility | Payer: No Typology Code available for payment source | Attending: Hospitalist | Admitting: Hospitalist

## 2021-01-03 DIAGNOSIS — J920 Pleural plaque with presence of asbestos: Secondary | ICD-10-CM | POA: Diagnosis present

## 2021-01-03 DIAGNOSIS — D649 Anemia, unspecified: Secondary | ICD-10-CM | POA: Diagnosis not present

## 2021-01-03 DIAGNOSIS — R6520 Severe sepsis without septic shock: Secondary | ICD-10-CM | POA: Diagnosis present

## 2021-01-03 DIAGNOSIS — E785 Hyperlipidemia, unspecified: Secondary | ICD-10-CM | POA: Diagnosis present

## 2021-01-03 DIAGNOSIS — I361 Nonrheumatic tricuspid (valve) insufficiency: Secondary | ICD-10-CM | POA: Diagnosis not present

## 2021-01-03 DIAGNOSIS — J441 Chronic obstructive pulmonary disease with (acute) exacerbation: Secondary | ICD-10-CM

## 2021-01-03 DIAGNOSIS — I5041 Acute combined systolic (congestive) and diastolic (congestive) heart failure: Secondary | ICD-10-CM | POA: Diagnosis present

## 2021-01-03 DIAGNOSIS — A419 Sepsis, unspecified organism: Secondary | ICD-10-CM

## 2021-01-03 DIAGNOSIS — I10 Essential (primary) hypertension: Secondary | ICD-10-CM

## 2021-01-03 DIAGNOSIS — I272 Pulmonary hypertension, unspecified: Secondary | ICD-10-CM | POA: Diagnosis present

## 2021-01-03 DIAGNOSIS — A4181 Sepsis due to Enterococcus: Secondary | ICD-10-CM | POA: Diagnosis present

## 2021-01-03 DIAGNOSIS — R7881 Bacteremia: Secondary | ICD-10-CM | POA: Diagnosis not present

## 2021-01-03 DIAGNOSIS — Z66 Do not resuscitate: Secondary | ICD-10-CM | POA: Diagnosis not present

## 2021-01-03 DIAGNOSIS — K5900 Constipation, unspecified: Secondary | ICD-10-CM | POA: Diagnosis present

## 2021-01-03 DIAGNOSIS — F05 Delirium due to known physiological condition: Secondary | ICD-10-CM | POA: Diagnosis present

## 2021-01-03 DIAGNOSIS — K219 Gastro-esophageal reflux disease without esophagitis: Secondary | ICD-10-CM | POA: Diagnosis not present

## 2021-01-03 DIAGNOSIS — J1282 Pneumonia due to coronavirus disease 2019: Secondary | ICD-10-CM | POA: Diagnosis not present

## 2021-01-03 DIAGNOSIS — D631 Anemia in chronic kidney disease: Secondary | ICD-10-CM | POA: Diagnosis present

## 2021-01-03 DIAGNOSIS — I351 Nonrheumatic aortic (valve) insufficiency: Secondary | ICD-10-CM | POA: Diagnosis not present

## 2021-01-03 DIAGNOSIS — I340 Nonrheumatic mitral (valve) insufficiency: Secondary | ICD-10-CM | POA: Diagnosis not present

## 2021-01-03 DIAGNOSIS — J439 Emphysema, unspecified: Secondary | ICD-10-CM | POA: Diagnosis present

## 2021-01-03 DIAGNOSIS — I132 Hypertensive heart and chronic kidney disease with heart failure and with stage 5 chronic kidney disease, or end stage renal disease: Secondary | ICD-10-CM | POA: Diagnosis present

## 2021-01-03 DIAGNOSIS — B952 Enterococcus as the cause of diseases classified elsewhere: Secondary | ICD-10-CM | POA: Diagnosis not present

## 2021-01-03 DIAGNOSIS — Z881 Allergy status to other antibiotic agents status: Secondary | ICD-10-CM

## 2021-01-03 DIAGNOSIS — N179 Acute kidney failure, unspecified: Secondary | ICD-10-CM | POA: Diagnosis not present

## 2021-01-03 DIAGNOSIS — J9621 Acute and chronic respiratory failure with hypoxia: Secondary | ICD-10-CM | POA: Diagnosis present

## 2021-01-03 DIAGNOSIS — K8020 Calculus of gallbladder without cholecystitis without obstruction: Secondary | ICD-10-CM | POA: Diagnosis present

## 2021-01-03 DIAGNOSIS — J9601 Acute respiratory failure with hypoxia: Secondary | ICD-10-CM | POA: Diagnosis not present

## 2021-01-03 DIAGNOSIS — R778 Other specified abnormalities of plasma proteins: Secondary | ICD-10-CM

## 2021-01-03 DIAGNOSIS — M4854XA Collapsed vertebra, not elsewhere classified, thoracic region, initial encounter for fracture: Secondary | ICD-10-CM | POA: Diagnosis present

## 2021-01-03 DIAGNOSIS — R0602 Shortness of breath: Secondary | ICD-10-CM

## 2021-01-03 DIAGNOSIS — N390 Urinary tract infection, site not specified: Secondary | ICD-10-CM | POA: Diagnosis present

## 2021-01-03 DIAGNOSIS — E1122 Type 2 diabetes mellitus with diabetic chronic kidney disease: Secondary | ICD-10-CM | POA: Diagnosis present

## 2021-01-03 DIAGNOSIS — Z888 Allergy status to other drugs, medicaments and biological substances status: Secondary | ICD-10-CM

## 2021-01-03 DIAGNOSIS — D509 Iron deficiency anemia, unspecified: Secondary | ICD-10-CM | POA: Diagnosis present

## 2021-01-03 DIAGNOSIS — U071 COVID-19: Secondary | ICD-10-CM | POA: Diagnosis not present

## 2021-01-03 DIAGNOSIS — I350 Nonrheumatic aortic (valve) stenosis: Secondary | ICD-10-CM | POA: Diagnosis present

## 2021-01-03 DIAGNOSIS — I5021 Acute systolic (congestive) heart failure: Secondary | ICD-10-CM | POA: Diagnosis not present

## 2021-01-03 DIAGNOSIS — I509 Heart failure, unspecified: Secondary | ICD-10-CM | POA: Diagnosis not present

## 2021-01-03 DIAGNOSIS — R7989 Other specified abnormal findings of blood chemistry: Secondary | ICD-10-CM | POA: Diagnosis present

## 2021-01-03 DIAGNOSIS — I5031 Acute diastolic (congestive) heart failure: Secondary | ICD-10-CM | POA: Diagnosis not present

## 2021-01-03 DIAGNOSIS — J189 Pneumonia, unspecified organism: Secondary | ICD-10-CM

## 2021-01-03 DIAGNOSIS — E782 Mixed hyperlipidemia: Secondary | ICD-10-CM | POA: Diagnosis present

## 2021-01-03 DIAGNOSIS — Z882 Allergy status to sulfonamides status: Secondary | ICD-10-CM

## 2021-01-03 DIAGNOSIS — T502X5A Adverse effect of carbonic-anhydrase inhibitors, benzothiadiazides and other diuretics, initial encounter: Secondary | ICD-10-CM | POA: Diagnosis not present

## 2021-01-03 DIAGNOSIS — N411 Chronic prostatitis: Secondary | ICD-10-CM | POA: Diagnosis present

## 2021-01-03 DIAGNOSIS — Z7951 Long term (current) use of inhaled steroids: Secondary | ICD-10-CM

## 2021-01-03 DIAGNOSIS — Z79899 Other long term (current) drug therapy: Secondary | ICD-10-CM

## 2021-01-03 DIAGNOSIS — N186 End stage renal disease: Secondary | ICD-10-CM | POA: Diagnosis present

## 2021-01-03 DIAGNOSIS — N170 Acute kidney failure with tubular necrosis: Secondary | ICD-10-CM | POA: Diagnosis present

## 2021-01-03 DIAGNOSIS — I959 Hypotension, unspecified: Secondary | ICD-10-CM | POA: Diagnosis not present

## 2021-01-03 DIAGNOSIS — I248 Other forms of acute ischemic heart disease: Secondary | ICD-10-CM | POA: Diagnosis present

## 2021-01-03 DIAGNOSIS — N400 Enlarged prostate without lower urinary tract symptoms: Secondary | ICD-10-CM | POA: Diagnosis present

## 2021-01-03 DIAGNOSIS — I2510 Atherosclerotic heart disease of native coronary artery without angina pectoris: Secondary | ICD-10-CM | POA: Diagnosis present

## 2021-01-03 DIAGNOSIS — J449 Chronic obstructive pulmonary disease, unspecified: Secondary | ICD-10-CM | POA: Diagnosis present

## 2021-01-03 DIAGNOSIS — Z992 Dependence on renal dialysis: Secondary | ICD-10-CM

## 2021-01-03 DIAGNOSIS — Z87891 Personal history of nicotine dependence: Secondary | ICD-10-CM

## 2021-01-03 DIAGNOSIS — R0902 Hypoxemia: Secondary | ICD-10-CM

## 2021-01-03 DIAGNOSIS — E875 Hyperkalemia: Secondary | ICD-10-CM | POA: Diagnosis present

## 2021-01-03 DIAGNOSIS — Z8673 Personal history of transient ischemic attack (TIA), and cerebral infarction without residual deficits: Secondary | ICD-10-CM

## 2021-01-03 DIAGNOSIS — Z7189 Other specified counseling: Secondary | ICD-10-CM | POA: Diagnosis not present

## 2021-01-03 DIAGNOSIS — Z9115 Patient's noncompliance with renal dialysis: Secondary | ICD-10-CM

## 2021-01-03 DIAGNOSIS — E860 Dehydration: Secondary | ICD-10-CM | POA: Diagnosis present

## 2021-01-03 DIAGNOSIS — Z515 Encounter for palliative care: Secondary | ICD-10-CM | POA: Diagnosis not present

## 2021-01-03 HISTORY — DX: Heart failure, unspecified: I50.9

## 2021-01-03 HISTORY — DX: Chronic obstructive pulmonary disease, unspecified: J44.9

## 2021-01-03 HISTORY — DX: Gastro-esophageal reflux disease without esophagitis: K21.9

## 2021-01-03 HISTORY — DX: Hyperlipidemia, unspecified: E78.5

## 2021-01-03 HISTORY — DX: Essential (primary) hypertension: I10

## 2021-01-03 LAB — RESP PANEL BY RT-PCR (FLU A&B, COVID) ARPGX2
Influenza A by PCR: NEGATIVE
Influenza B by PCR: NEGATIVE
SARS Coronavirus 2 by RT PCR: NEGATIVE

## 2021-01-03 LAB — CBC WITH DIFFERENTIAL/PLATELET
Abs Immature Granulocytes: 0.07 10*3/uL (ref 0.00–0.07)
Basophils Absolute: 0 10*3/uL (ref 0.0–0.1)
Basophils Relative: 0 %
Eosinophils Absolute: 0.1 10*3/uL (ref 0.0–0.5)
Eosinophils Relative: 1 %
HCT: 32.2 % — ABNORMAL LOW (ref 39.0–52.0)
Hemoglobin: 10.6 g/dL — ABNORMAL LOW (ref 13.0–17.0)
Immature Granulocytes: 1 %
Lymphocytes Relative: 11 %
Lymphs Abs: 1.4 10*3/uL (ref 0.7–4.0)
MCH: 28.6 pg (ref 26.0–34.0)
MCHC: 32.9 g/dL (ref 30.0–36.0)
MCV: 86.8 fL (ref 80.0–100.0)
Monocytes Absolute: 1.2 10*3/uL — ABNORMAL HIGH (ref 0.1–1.0)
Monocytes Relative: 10 %
Neutro Abs: 9.3 10*3/uL — ABNORMAL HIGH (ref 1.7–7.7)
Neutrophils Relative %: 77 %
Platelets: 245 10*3/uL (ref 150–400)
RBC: 3.71 MIL/uL — ABNORMAL LOW (ref 4.22–5.81)
RDW: 13.6 % (ref 11.5–15.5)
WBC: 12.1 10*3/uL — ABNORMAL HIGH (ref 4.0–10.5)
nRBC: 0 % (ref 0.0–0.2)

## 2021-01-03 LAB — BASIC METABOLIC PANEL
Anion gap: 7 (ref 5–15)
BUN: 17 mg/dL (ref 8–23)
CO2: 24 mmol/L (ref 22–32)
Calcium: 8.2 mg/dL — ABNORMAL LOW (ref 8.9–10.3)
Chloride: 99 mmol/L (ref 98–111)
Creatinine, Ser: 1.54 mg/dL — ABNORMAL HIGH (ref 0.61–1.24)
GFR, Estimated: 45 mL/min — ABNORMAL LOW (ref >60)
Glucose, Bld: 119 mg/dL — ABNORMAL HIGH (ref 70–99)
Potassium: 4.3 mmol/L (ref 3.5–5.1)
Sodium: 130 mmol/L — ABNORMAL LOW (ref 135–145)

## 2021-01-03 LAB — TROPONIN I (HIGH SENSITIVITY)
Troponin I (High Sensitivity): 105 ng/L (ref <18)
Troponin I (High Sensitivity): 147 ng/L (ref <18)
Troponin I (High Sensitivity): 107 ng/L (ref <18)

## 2021-01-03 LAB — COMPREHENSIVE METABOLIC PANEL
ALT: 85 U/L — ABNORMAL HIGH (ref 0–44)
AST: 66 U/L — ABNORMAL HIGH (ref 15–41)
Albumin: 2.5 g/dL — ABNORMAL LOW (ref 3.5–5.0)
Alkaline Phosphatase: 87 U/L (ref 38–126)
Anion gap: 9 (ref 5–15)
BUN: 20 mg/dL (ref 8–23)
CO2: 22 mmol/L (ref 22–32)
Calcium: 8 mg/dL — ABNORMAL LOW (ref 8.9–10.3)
Chloride: 100 mmol/L (ref 98–111)
Creatinine, Ser: 1.64 mg/dL — ABNORMAL HIGH (ref 0.61–1.24)
GFR, Estimated: 42 mL/min — ABNORMAL LOW (ref >60)
Glucose, Bld: 132 mg/dL — ABNORMAL HIGH (ref 70–99)
Potassium: 3.8 mmol/L (ref 3.5–5.1)
Sodium: 131 mmol/L — ABNORMAL LOW (ref 135–145)
Total Bilirubin: 1.3 mg/dL — ABNORMAL HIGH (ref 0.3–1.2)
Total Protein: 6.7 g/dL (ref 6.5–8.1)

## 2021-01-03 LAB — LACTIC ACID, PLASMA: Lactic Acid, Venous: 1 mmol/L (ref 0.5–1.9)

## 2021-01-03 LAB — STREP PNEUMONIAE URINARY ANTIGEN: Strep Pneumo Urinary Antigen: NEGATIVE

## 2021-01-03 LAB — BRAIN NATRIURETIC PEPTIDE: B Natriuretic Peptide: 473.6 pg/mL — ABNORMAL HIGH (ref 0.0–100.0)

## 2021-01-03 LAB — PROCALCITONIN: Procalcitonin: 0.18 ng/mL

## 2021-01-03 IMAGING — CR DG CHEST 2V
1 series · 2 of 2 positions shown · non-contrast
Comparison: [DATE] chest radiograph.

CLINICAL DATA: Dyspnea

EXAM:
CHEST - 2 VIEW

[Series 1: dg chest 2 view · 0.14mm/px · 2 of 2 slices shown]
[im 1/2]
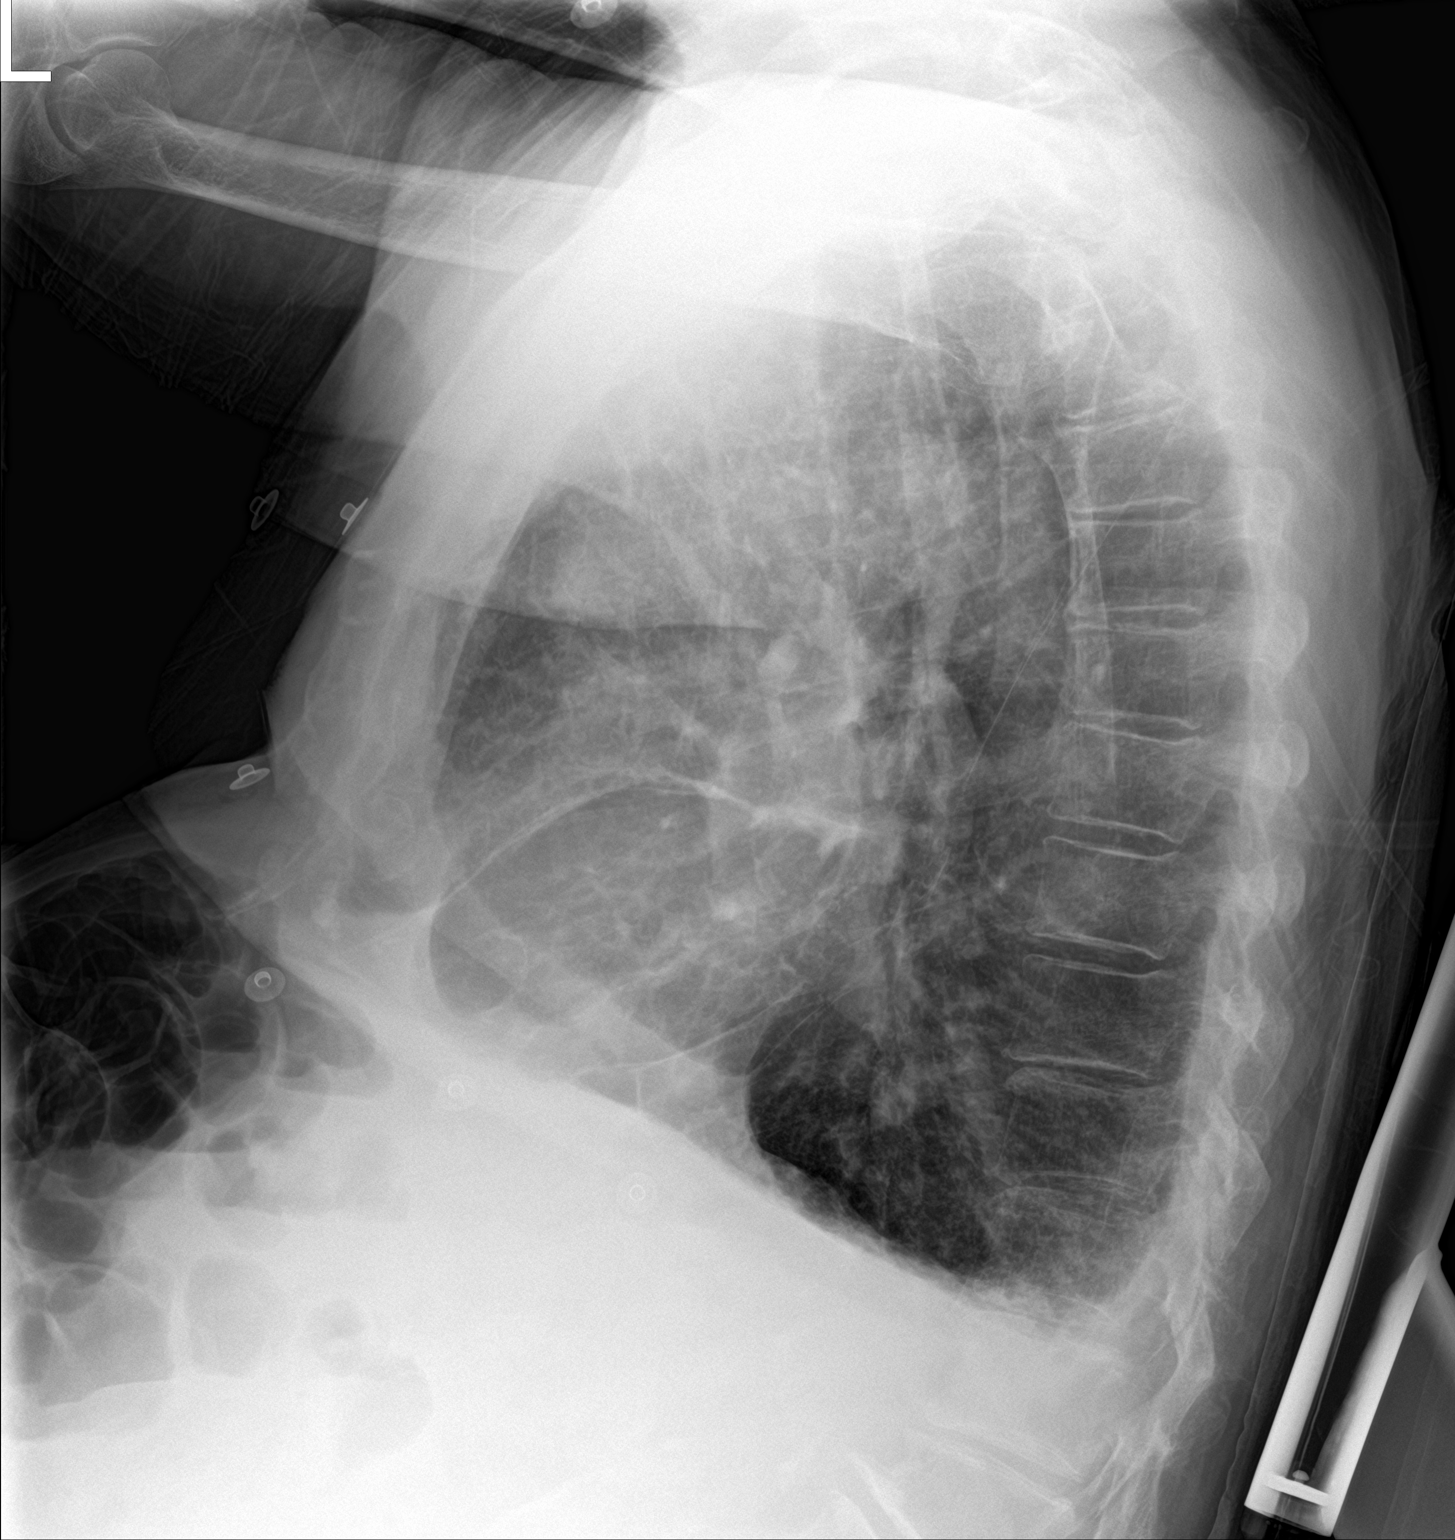
[im 2/2]
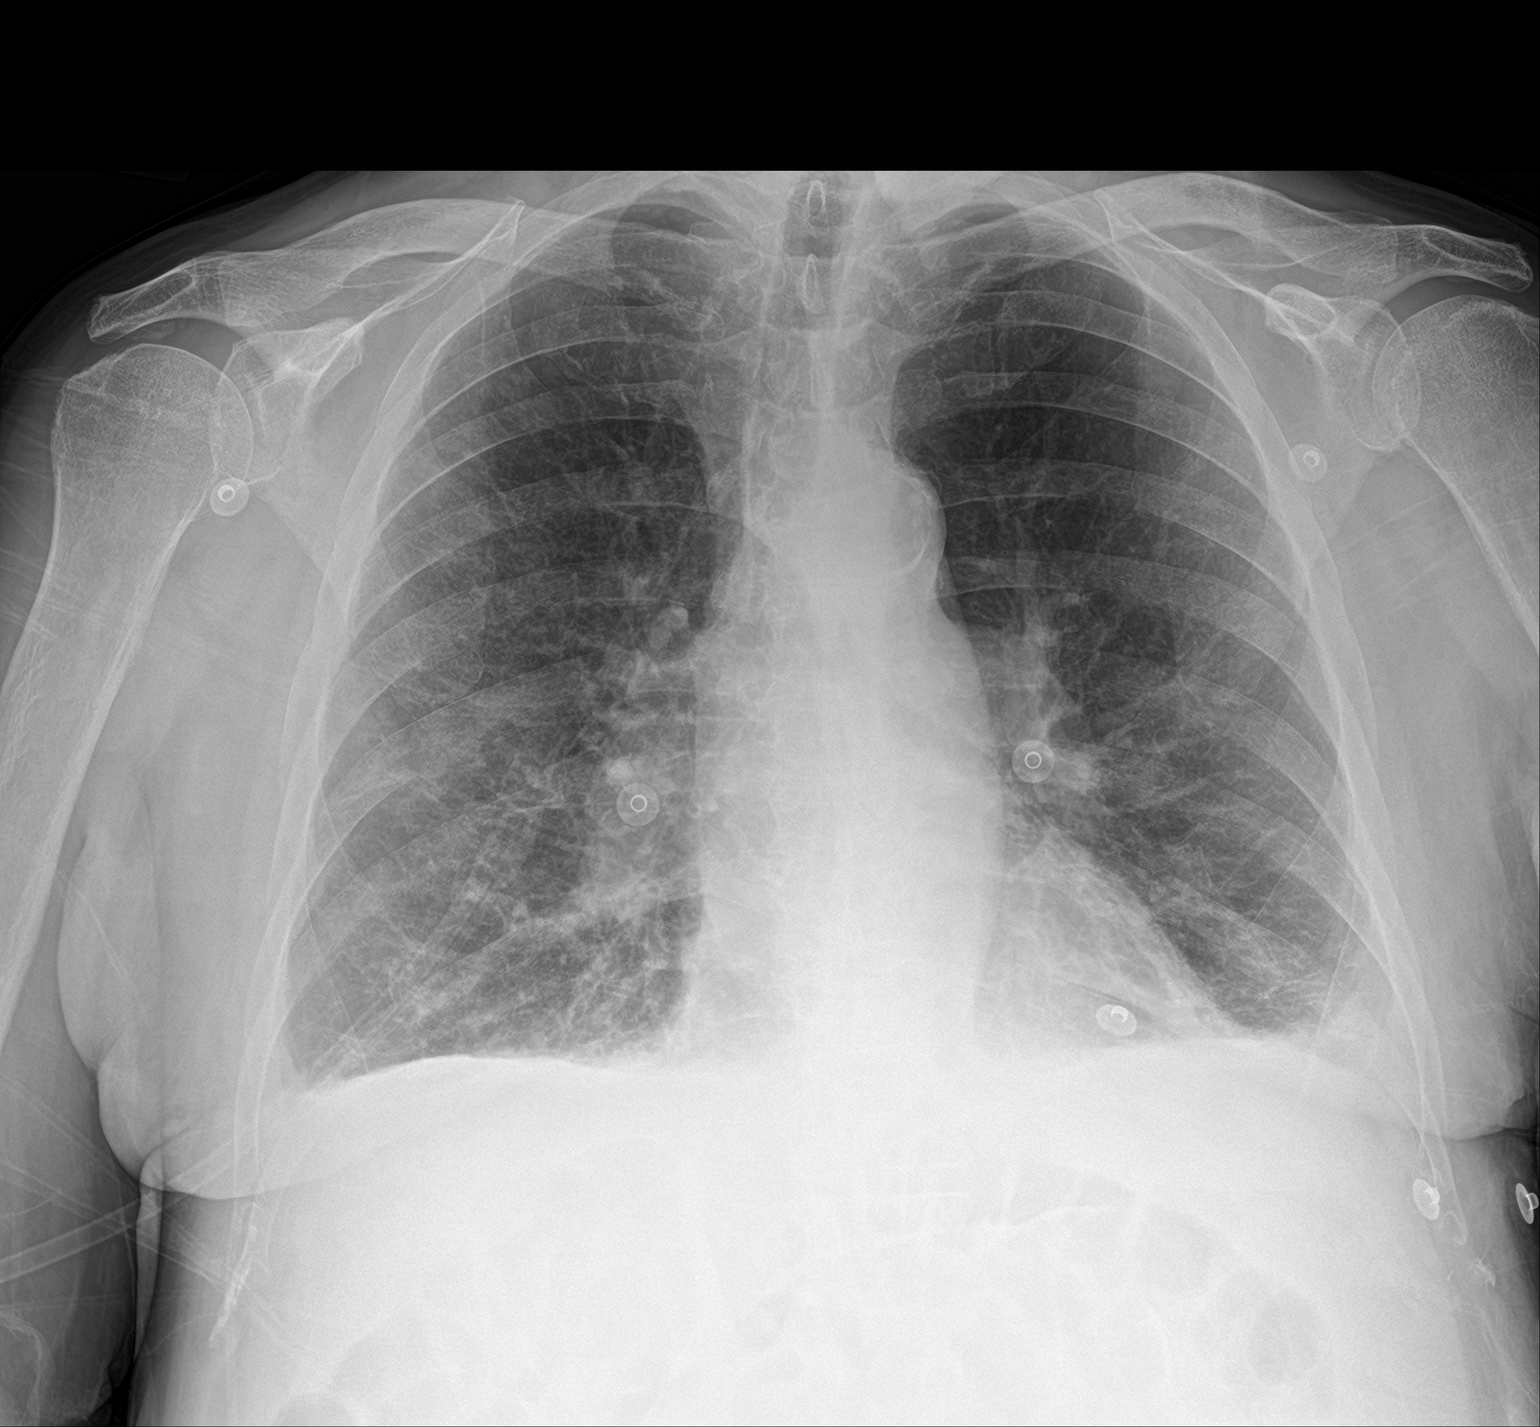

[2 of 2 positions shown; findings below may reference images not displayed]

FINDINGS: Stable cardiomediastinal silhouette with normal heart size. No
pneumothorax. Trace bilateral pleural effusions. Hyperinflated
lungs. No pulmonary edema. Mild streaky bibasilar lung opacities.
Mild hazy opacity in the right mid lung.
IMPRESSION: 1. Mild hazy opacity in the right mid lung, cannot exclude
mild/developing pneumonia. Follow-up chest radiograph suggested.
2. Mild streaky bibasilar lung opacities, favor atelectasis.
3. Hyperinflated lungs, suggesting COPD.
4. Trace bilateral pleural effusions.

## 2021-01-03 MED ORDER — LEVALBUTEROL HCL 0.63 MG/3ML IN NEBU
0.6300 mg | INHALATION_SOLUTION | Freq: Four times a day (QID) | RESPIRATORY_TRACT | Status: DC | PRN
  Administered 2021-01-07: 0.63 mg via RESPIRATORY_TRACT
  Filled 2021-01-03: qty 3

## 2021-01-03 MED ORDER — ONDANSETRON HCL 4 MG/2ML IJ SOLN
4.0000 mg | Freq: Three times a day (TID) | INTRAMUSCULAR | Status: DC | PRN
  Administered 2021-01-05 – 2021-01-17 (×4): 4 mg via INTRAVENOUS
  Filled 2021-01-03 (×5): qty 2

## 2021-01-03 MED ORDER — FUROSEMIDE 10 MG/ML IJ SOLN
40.0000 mg | Freq: Once | INTRAMUSCULAR | Status: AC
  Administered 2021-01-03: 40 mg via INTRAVENOUS
  Filled 2021-01-03: qty 4

## 2021-01-03 MED ORDER — ASPIRIN EC 81 MG PO TBEC
81.0000 mg | DELAYED_RELEASE_TABLET | Freq: Every day | ORAL | Status: DC
  Administered 2021-01-04 – 2021-01-24 (×13): 81 mg via ORAL
  Filled 2021-01-03 (×14): qty 1

## 2021-01-03 MED ORDER — HYDRALAZINE HCL 20 MG/ML IJ SOLN: 5.0000 mg | INTRAMUSCULAR | Status: DC | PRN

## 2021-01-03 MED ORDER — SODIUM CHLORIDE 0.9 % IV SOLN
1.0000 g | Freq: Once | INTRAVENOUS | Status: AC
  Administered 2021-01-03: 1 g via INTRAVENOUS
  Filled 2021-01-03: qty 10

## 2021-01-03 MED ORDER — ENOXAPARIN SODIUM 40 MG/0.4ML IJ SOSY
40.0000 mg | PREFILLED_SYRINGE | Freq: Every day | INTRAMUSCULAR | Status: DC
  Administered 2021-01-03 – 2021-01-07 (×5): 40 mg via SUBCUTANEOUS
  Filled 2021-01-03 (×5): qty 0.4

## 2021-01-03 MED ORDER — PANTOPRAZOLE SODIUM 40 MG PO TBEC
40.0000 mg | DELAYED_RELEASE_TABLET | Freq: Every day | ORAL | Status: DC
  Administered 2021-01-03 – 2021-01-08 (×6): 40 mg via ORAL
  Filled 2021-01-03 (×6): qty 1

## 2021-01-03 MED ORDER — LEVOFLOXACIN IN D5W 750 MG/150ML IV SOLN
750.0000 mg | INTRAVENOUS | Status: DC
  Administered 2021-01-03: 750 mg via INTRAVENOUS

## 2021-01-03 MED ORDER — ROSUVASTATIN CALCIUM 10 MG PO TABS
40.0000 mg | ORAL_TABLET | Freq: Every day | ORAL | Status: DC
  Administered 2021-01-03 – 2021-01-08 (×6): 40 mg via ORAL
  Filled 2021-01-03: qty 8
  Filled 2021-01-03 (×2): qty 4
  Filled 2021-01-03: qty 2
  Filled 2021-01-03: qty 8
  Filled 2021-01-03: qty 4
  Filled 2021-01-03: qty 2

## 2021-01-03 MED ORDER — DM-GUAIFENESIN ER 30-600 MG PO TB12
1.0000 | ORAL_TABLET | Freq: Two times a day (BID) | ORAL | Status: DC | PRN
  Administered 2021-01-05 – 2021-01-17 (×2): 1 via ORAL
  Filled 2021-01-03 (×2): qty 1

## 2021-01-03 MED ORDER — ACETAMINOPHEN 325 MG PO TABS
650.0000 mg | ORAL_TABLET | Freq: Once | ORAL | Status: AC
  Administered 2021-01-03: 650 mg via ORAL
  Filled 2021-01-03: qty 2

## 2021-01-03 MED ORDER — IPRATROPIUM-ALBUTEROL 0.5-2.5 (3) MG/3ML IN SOLN
3.0000 mL | RESPIRATORY_TRACT | Status: DC
  Administered 2021-01-03 – 2021-01-05 (×6): 3 mL via RESPIRATORY_TRACT
  Filled 2021-01-03 (×4): qty 3
  Filled 2021-01-03: qty 6
  Filled 2021-01-03: qty 3

## 2021-01-03 MED ORDER — ASPIRIN 81 MG PO CHEW
324.0000 mg | CHEWABLE_TABLET | Freq: Once | ORAL | Status: AC
  Administered 2021-01-03: 324 mg via ORAL
  Filled 2021-01-03: qty 4

## 2021-01-03 MED ORDER — SODIUM CHLORIDE 0.9 % IV SOLN: 1.0000 g | INTRAVENOUS | Status: DC

## 2021-01-03 MED ORDER — ADULT MULTIVITAMIN W/MINERALS CH
1.0000 | ORAL_TABLET | Freq: Every day | ORAL | Status: DC
  Administered 2021-01-03 – 2021-01-24 (×14): 1 via ORAL
  Filled 2021-01-03 (×15): qty 1

## 2021-01-03 MED ORDER — ALBUTEROL SULFATE (2.5 MG/3ML) 0.083% IN NEBU
2.5000 mg | INHALATION_SOLUTION | RESPIRATORY_TRACT | Status: DC | PRN
  Administered 2021-01-04 – 2021-01-06 (×3): 2.5 mg via RESPIRATORY_TRACT
  Filled 2021-01-03 (×3): qty 3

## 2021-01-03 MED ORDER — LEVOFLOXACIN IN D5W 750 MG/150ML IV SOLN: 750.0000 mg | INTRAVENOUS | Status: DC

## 2021-01-03 MED ORDER — SODIUM CHLORIDE 0.9 % IV SOLN
100.0000 mg | Freq: Once | INTRAVENOUS | Status: DC
  Filled 2021-01-03: qty 100

## 2021-01-03 MED ORDER — ACETAMINOPHEN 325 MG PO TABS
650.0000 mg | ORAL_TABLET | Freq: Four times a day (QID) | ORAL | Status: DC | PRN
  Administered 2021-01-05 – 2021-01-18 (×6): 650 mg via ORAL
  Filled 2021-01-03 (×6): qty 2

## 2021-01-03 MED ORDER — VITAMIN D 25 MCG (1000 UNIT) PO TABS
1000.0000 [IU] | ORAL_TABLET | Freq: Every day | ORAL | Status: DC
  Administered 2021-01-03 – 2021-01-24 (×14): 1000 [IU] via ORAL
  Filled 2021-01-03 (×16): qty 1

## 2021-01-03 MED ORDER — OMEGA-3-ACID ETHYL ESTERS 1 G PO CAPS
1.0000 g | ORAL_CAPSULE | Freq: Every day | ORAL | Status: DC
  Administered 2021-01-03 – 2021-01-24 (×14): 1 g via ORAL
  Filled 2021-01-03 (×15): qty 1

## 2021-01-03 MED ORDER — VANCOMYCIN HCL 2000 MG/400ML IV SOLN
2000.0000 mg | Freq: Once | INTRAVENOUS | Status: AC
  Administered 2021-01-03: 2000 mg via INTRAVENOUS
  Filled 2021-01-03: qty 400

## 2021-01-03 NOTE — ED Notes (Signed)
Pt presents to the ED for SOB for the past couple of days. Pt is newly dx with CHF. Pt states that he placed himself on 2L [place] because he was SOB. Pt also endorsing bilateral edema in lower extremities. Pt is A&OX4 and NAD.

## 2021-01-03 NOTE — ED Notes (Signed)
Pt is on RA at this time, placed on RA by Dr. Starleen Blue, [place].

## 2021-01-03 NOTE — ED Provider Notes (Signed)
[facility]  ____________________________________________   Event Date/Time   First MD Initiated Contact with Patient 01/03/21 [phone]     (approximate)  I have reviewed the triage vital signs and the nursing notes.   HISTORY  Chief Complaint Shortness of Breath    HPI Phillip Chavez is a 80 y.o. 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  Patient symptoms have been going on for about a month.  Tells me he was initially seen at the [place] and diagnosed with a viral upper respiratory tract infection.  This lasted for about 10 days and he seemed to get somewhat better but then symptoms returned.  He is now having cough productive of green sputum.  He has significant dyspnea on exertion as well as with lying flat.  He feels pain in the right upper back which she attributes to the right lung but denies any anterior chest pain.  Has had increasing lower extremity edema.  Per his wife they were just told last week that he has heart failure and were started on Lasix but has not yet had an echocardiogram.  He denies abdominal discomfort nausea vomiting fevers or chills.  Denies urinary symptoms.  Denies sick contacts.         History reviewed. No pertinent past medical history.  There are no problems to display for this patient.   History reviewed. No pertinent surgical history.  Prior to Admission medications   Medication Sig Start Date End Date Taking? Authorizing Provider  docusate sodium (COLACE) 100 MG capsule Take 100 mg by mouth 2 (two) times daily.    [provider]  Fluticasone-Salmeterol (ADVAIR) 250-50 MCG/DOSE AEPB Inhale 1 puff into the lungs 2 (two) times daily.    [provider]  ketotifen (ZADITOR) 0.025 % ophthalmic solution 1 drop 2 (two) times daily.    [provider]  losartan (COZAAR) 50 MG tablet Take 50 mg  by mouth daily.    [provider]  omeprazole (PRILOSEC) 20 MG capsule Take 20 mg by mouth daily.    [provider]  pseudoephedrine-acetaminophen (TYLENOL SINUS) 30-500 MG TABS tablet Take 1 tablet by mouth every 4 (four) hours as needed.    [provider]  rosuvastatin (CRESTOR) 40 MG tablet Take 40 mg by mouth daily.    [provider]    Allergies Sulfa antibiotics  History reviewed. No pertinent family history.  Social History Social History   Tobacco Use   Smoking status: Former    Packs/day: 1.00    Years: 20.00    Pack years: 20.00    Types: Cigarettes    Quit date: 03/21/1978    Years since quitting: 42.8   Smokeless tobacco: Never    Review of Systems   Review of Systems  Constitutional:  Positive for appetite change. Negative for chills and fever.  Respiratory:  Positive for cough and shortness of breath. Negative for chest tightness.   Cardiovascular:  Positive for leg swelling. Negative for chest pain.  Gastrointestinal:  Negative for abdominal pain, nausea and vomiting.  Genitourinary:  Negative for dysuria.  All other systems reviewed and are negative.  Physical Exam Updated Vital Signs BP (!) 124/44   Pulse 71   Temp (!) 100.4 F (38 C) (Oral)   Resp 17   Ht 5\' 5"  (1.651 m)   Wt 78 kg   SpO2 98%  BMI 28.62 kg/m   Physical Exam Vitals and nursing note reviewed.  Constitutional:      General: He is not in acute distress.    Appearance: Normal appearance. He is diaphoretic.  HENT:     Head: Normocephalic and atraumatic.  Eyes:     General: No scleral icterus.    Conjunctiva/sclera: Conjunctivae normal.  Cardiovascular:     Rate and Rhythm: Normal rate and regular rhythm.  Pulmonary:     Effort: Pulmonary effort is normal. No respiratory distress.     Breath sounds: Rales present. No wheezing.     Comments: Rales at the bilateral bases Abdominal:     Palpations: Abdomen is soft. There is no mass.      Tenderness: There is no guarding.  Musculoskeletal:        General: No deformity or signs of injury.     Cervical back: Normal range of motion.     Right lower leg: Edema present.     Left lower leg: Edema present.  Skin:    Coloration: Skin is not jaundiced or pale.  Neurological:     General: No focal deficit present.     Mental Status: He is alert and oriented to person, place, and time. Mental status is at baseline.  Psychiatric:        Mood and Affect: Mood normal.        Behavior: Behavior normal.     LABS (all labs ordered are listed, but only abnormal results are displayed)  Labs Reviewed  CBC WITH DIFFERENTIAL/PLATELET - Abnormal; Notable for the following components:      Result Value   WBC 12.1 (*)    RBC 3.71 (*)    Hemoglobin 10.6 (*)    HCT 32.2 (*)    Neutro Abs 9.3 (*)    Monocytes Absolute 1.2 (*)    All other components within normal limits  BASIC METABOLIC PANEL - Abnormal; Notable for the following components:   Sodium 130 (*)    Glucose, Bld 119 (*)    Creatinine, Ser 1.54 (*)    Calcium 8.2 (*)    GFR, Estimated 45 (*)    All other components within normal limits  BRAIN NATRIURETIC PEPTIDE - Abnormal; Notable for the following components:   B Natriuretic Peptide 473.6 (*)    All other components within normal limits  TROPONIN I (HIGH SENSITIVITY) - Abnormal; Notable for the following components:   Troponin I (High Sensitivity) 105 (*)    All other components within normal limits  TROPONIN I (HIGH SENSITIVITY) - Abnormal; Notable for the following components:   Troponin I (High Sensitivity) 147 (*)    All other components within normal limits  RESP PANEL BY RT-PCR (FLU A&B, COVID) ARPGX2  CULTURE, BLOOD (ROUTINE X 2)  CULTURE, BLOOD (ROUTINE X 2)  LACTIC ACID, PLASMA  LACTIC ACID, PLASMA   ____________________________________________  EKG  NSR, nml axis, nml intervals, no acute ischemic changes   ____________________________________________  RADIOLOGY I, Madelin Headings, personally viewed and evaluated these images (plain radiographs) as part of my medical decision making, as well as reviewing the written report by the radiologist.  ED MD interpretation: I reviewed the chest x-ray which is concerning for multifocal pneumonia    ____________________________________________   PROCEDURES  Procedure(s) performed (including Critical Care):  Procedures   ____________________________________________   INITIAL IMPRESSION / ASSESSMENT AND PLAN / ED COURSE     Patient is an 80 year old male who presents with about a  month of shortness of breath.  He is now having cough productive of green sputum.  He gets all of his care at the [place] tells me that he was recently diagnosed with CHF has not had an echo yet but was started on Lasix.  He was initially on 2 L oxygen when I evaluated him but is satting well with this is taken off at rest.  He does have a fever of 100.4 but otherwise normal vital signs.  Patient looks somewhat unwell but is nontoxic slightly diaphoretic.  He has rales at the bases and looks volume overloaded.  Clinically appears to be having both community-acquired pneumonia as well as CHF exacerbation.  His labs are notable for leukocytosis, mild hyponatremia, creatinine 1.5 troponin of 140 and a BNP of 470.  Patient's EKG is nonischemic.  He has right lung pain worse with coughing but no typical chest pain so suspect that his troponin elevation is in the setting of demand due to CHF and infection.  Patient tells me he has a whole host of allergies.  I am able to see in the [place] records there is a list of allergens which does include cefpodoxime, amoxicillin, azithromycin, doxycycline and all fluoroquinolones.  Patient cannot tell me what his reactions were to these of that azithromycin made him very jittery and unable to walk.  He is adamant that he cannot have azithromycin.  By  history he does not have any prior anaphylactic reactions hives etc.  Will cover with ceftriaxone and doxycycline.  I did attempt to get the patient transferred to the [facility] due to his preference but they were all full.  We will send lactate and blood cultures given he is meeting sepsis criteria.  We will avoid fluids at this time given he appears clinically volume overloaded.  Will admit to the hospitalist service. Clinical Course as of 01/03/21 0932  Sat Jan 03, 2021  0850 WBC(!): 12.1 [KM]    Clinical Course User Index [KM] Rada Hay, MD     ____________________________________________   FINAL CLINICAL IMPRESSION(S) / ED DIAGNOSES  Final diagnoses:  Community acquired pneumonia, unspecified laterality  Congestive heart failure, unspecified HF chronicity, unspecified heart failure type [facility])     ED Discharge Orders     None        Note:  This document was prepared using Dragon voice recognition software and may include unintentional dictation errors.    Rada Hay, MD 01/03/21 [phone]

## 2021-01-03 NOTE — ED Notes (Signed)
EDP Dr. Starleen Blue at bedside at this time.

## 2021-01-03 NOTE — Consult Note (Signed)
Pharmacy Antibiotic Note  Phillip Chavez is a 80 y.o. male admitted on 01/03/2021 with pneumonia.  Pharmacy has been consulted for Levofloxacin dosing.  Plan: Levofloxacin 750 mg Q48H based on current renal function  Height: 5\' 5"  (165.1 cm) Weight: 78 kg (172 lb) IBW/kg (Calculated) : 61.5  Temp (24hrs), Avg:98.9 F (37.2 C), Min:98.1 F (36.7 C), Max:100.4 F (38 C)  Recent Labs  Lab 01/03/21 0605 01/03/21 0917  WBC 12.1*  --   CREATININE 1.54*  --   LATICACIDVEN  --  1.0    Estimated Creatinine Clearance: 36.2 mL/min (A) (by C-G formula based on SCr of 1.54 mg/dL (H)).    Allergies  Allergen Reactions   Azithromycin    Doxycycline    Sulfa Antibiotics Rash and Hypertension    Antimicrobials this admission: 11/5 ceftriaxone >> x 1  11/5 Vancomycin >> x 1 11/5 Levofloxacin  Dose adjustments this admission:   Microbiology results: 11/5 BCx: sent 11/5: respiratory pathogen PCR: sent  11/5 Sputum: sent  11/5 MRSA PCR: sent  Thank you for allowing pharmacy to be a part of this patient's care.  Dorothe Pea, PharmD, BCPS Clinical Pharmacist   01/03/2021 12:58 PM

## 2021-01-03 NOTE — H&P (Addendum)
History and Physical    Phillip Chavez WPV:[id number] DOB: 11/21/1940 DOA: 01/03/2021  Referring MD/NP/PA:   PCP: [facility]   Patient coming from:  The patient is coming from home.  At baseline, pt is independent for most of ADL.        Chief Complaint: SOB  HPI: Phillip Chavez is a 80 y.o. male with medical history significant of hypertension, hyperlipidemia, COPD not on oxygen at home, GERD, recently diagnosed CHF in [place], who presents with shortness of breath.  Patient states that he has been having upper respiratory symptoms of for almost a month. He was initially seen at the [place] and diagnosed with viral upper respiratory tract infection.  This lasted for about 10 days and he seemed to get somewhat better but then symptoms returned in past week.  He has shortness of breath, productive cough with greenish colored sputum production. Patient has fever and chills.  Patient denies chest pain, but reports right sided upper back pain.  Patient stated he has 1 episode of loose stool bowel movement, no nausea, vomiting, abdominal pain.  No symptoms of UTI.  He also reports worsening bilateral lower leg edema.  He has orthopnea, cannot lay flat for sleeping.  He states that he was recently diagnosed with CHF, but no 2D echo was done yet.  He was started on Lasix. Patient is not using oxygen normally, but was found to have oxygen desaturation to 89% on room air, which improved to 97% on 2 L oxygen ED.  The bed in [place] is not available.   ED Course: pt was found to have WBC 12.1, negative COVID PCR, troponin level 105, 147, BNP 473, AKI with creatinine 1.54, BUN 17 and GFR 45 (creatinine 1.17 and GFR> 60 on 02/01/2013), temperature 100.4, blood pressure 124/44, heart rate 93, RR 18.  Chest x-ray showed right middle lobe infiltration and streaks of bilateral basilar opacity.  Patient is admitted to progressive bed as inpatient.  Review of Systems:   General: has fevers, chills, no body  weight gain, has fatigue HEENT: no blurry vision, hearing changes or sore throat Respiratory: has dyspnea, coughing, no wheezing CV: no chest pain, no palpitations GI: no nausea, vomiting, abdominal pain, has diarrhea, no constipation GU: no dysuria, burning on urination, increased urinary frequency, hematuria  Ext: has leg edema Neuro: no unilateral weakness, numbness, or tingling, no vision change or hearing loss Skin: no rash, no skin tear. MSK: No muscle spasm, no deformity, no limitation of range of movement in spin Heme: No easy bruising.  Travel history: No recent long distant travel.  Allergy:  Allergies  Allergen Reactions   Azithromycin    Doxycycline    Sulfa Antibiotics Rash and Hypertension    Past Medical History:  Diagnosis Date   Chronic CHF (congestive heart failure) (HCC)    COPD (chronic obstructive pulmonary disease) (HCC)    GERD (gastroesophageal reflux disease)    HLD (hyperlipidemia)    HTN (hypertension)     Past Surgical History:  Procedure Laterality Date   dental procedure      Social History:  reports that he quit smoking about 42 years ago. His smoking use included cigarettes. He has a 20.00 pack-year smoking history. He has never used smokeless tobacco. He reports that he does not currently use alcohol. He reports that he does not use drugs.  Family History:  Family History  Problem Relation Age of Onset   Prostate cancer Father  Prostate cancer Brother    Pancreatic cancer Brother      Prior to Admission medications   Medication Sig Start Date End Date Taking? Authorizing Provider  docusate sodium (COLACE) 100 MG capsule Take 100 mg by mouth 2 (two) times daily.    [provider]  Fluticasone-Salmeterol (ADVAIR) 250-50 MCG/DOSE AEPB Inhale 1 puff into the lungs 2 (two) times daily.    [provider]  ketotifen (ZADITOR) 0.025 % ophthalmic solution 1 drop 2 (two) times daily.    [provider]  losartan  (COZAAR) 50 MG tablet Take 50 mg by mouth daily.    [provider]  omeprazole (PRILOSEC) 20 MG capsule Take 20 mg by mouth daily.    [provider]  pseudoephedrine-acetaminophen (TYLENOL SINUS) 30-500 MG TABS tablet Take 1 tablet by mouth every 4 (four) hours as needed.    [provider]  rosuvastatin (CRESTOR) 40 MG tablet Take 40 mg by mouth daily.    [provider]    Physical Exam: Vitals:   01/03/21 0548 01/03/21 0603 01/03/21 0900 01/03/21 0942  BP: (!) 155/42  (!) 124/44   Pulse: 93  71   Resp: 18  17   Temp: (!) 100.4 F (38 C)   98.3 F (36.8 C)  TempSrc: Oral   Oral  SpO2: 97%  98%   Weight:  78 kg    Height:  5\' 5"  (1.651 m)     General: Not in acute distress HEENT:       Eyes: PERRL, EOMI, no scleral icterus.       ENT: No discharge from the ears and nose, no pharynx injection, no tonsillar enlargement.        Neck: positive JVD, no bruit, no mass felt. Heme: No neck lymph node enlargement. Cardiac: S1/S2, RRR, No murmurs, No gallops or rubs. Respiratory: Has crackles bilaterally GI: Soft, nondistended, nontender, no rebound pain, no organomegaly, BS present. GU: No hematuria Ext: has 1+ pitting leg edema bilaterally. 1+DP/PT pulse bilaterally. Musculoskeletal: No joint deformities, No joint redness or warmth, no limitation of ROM in spin. Skin: No rashes.  Neuro: Alert, oriented X3, cranial nerves II-XII grossly intact, moves all extremities normally. Psych: Patient is not psychotic, no suicidal or hemocidal ideation.  Labs on Admission: I have personally reviewed following labs and imaging studies  CBC: Recent Labs  Lab 01/03/21 0605  WBC 12.1*  NEUTROABS 9.3*  HGB 10.6*  HCT 32.2*  MCV 86.8  PLT 027   Basic Metabolic Panel: Recent Labs  Lab 01/03/21 0605  NA 130*  K 4.3  CL 99  CO2 24  GLUCOSE 119*  BUN 17  CREATININE 1.54*  CALCIUM 8.2*   GFR: Estimated Creatinine Clearance: 36.2 mL/min (A) (by  C-G formula based on SCr of 1.54 mg/dL (H)). Liver Function Tests: No results for input(s): AST, ALT, ALKPHOS, BILITOT, PROT, ALBUMIN in the last 168 hours. No results for input(s): LIPASE, AMYLASE in the last 168 hours. No results for input(s): AMMONIA in the last 168 hours. Coagulation Profile: No results for input(s): INR, PROTIME in the last 168 hours. Cardiac Enzymes: No results for input(s): CKTOTAL, CKMB, CKMBINDEX, TROPONINI in the last 168 hours. BNP (last 3 results) No results for input(s): PROBNP in the last 8760 hours. HbA1C: No results for input(s): HGBA1C in the last 72 hours. CBG: No results for input(s): GLUCAP in the last 168 hours. Lipid Profile: No results for input(s): CHOL, HDL, LDLCALC, TRIG, CHOLHDL, LDLDIRECT in the  last 72 hours. Thyroid Function Tests: No results for input(s): TSH, T4TOTAL, FREET4, T3FREE, THYROIDAB in the last 72 hours. Anemia Panel: No results for input(s): VITAMINB12, FOLATE, FERRITIN, TIBC, IRON, RETICCTPCT in the last 72 hours. Urine analysis: No results found for: COLORURINE, APPEARANCEUR, LABSPEC, PHURINE, GLUCOSEU, HGBUR, BILIRUBINUR, KETONESUR, PROTEINUR, UROBILINOGEN, NITRITE, LEUKOCYTESUR Sepsis Labs: @LABRCNTIP (procalcitonin:4,lacticidven:4) ) Recent Results (from the past 240 hour(s))  Resp Panel by RT-PCR (Flu A&B, Covid) Nasopharyngeal Swab     Status: None   Collection Time: 01/03/21  6:06 AM   Specimen: Nasopharyngeal Swab; Nasopharyngeal(NP) swabs in vial transport medium  Result Value Ref Range Status   SARS Coronavirus 2 by RT PCR NEGATIVE NEGATIVE Final    Comment: (NOTE) SARS-CoV-2 target nucleic acids are NOT DETECTED.  The SARS-CoV-2 RNA is generally detectable in upper respiratory specimens during the acute phase of infection. The lowest concentration of SARS-CoV-2 viral copies this assay can detect is 138 copies/mL. A negative result does not preclude SARS-Cov-2 infection and should not be used as the sole  basis for treatment or other patient management decisions. A negative result may occur with  improper specimen collection/handling, submission of specimen other than nasopharyngeal swab, presence of viral mutation(s) within the areas targeted by this assay, and inadequate number of viral copies(<138 copies/mL). A negative result must be combined with clinical observations, patient history, and epidemiological information. The expected result is Negative.  Fact Sheet for Patients:  EntrepreneurPulse.com.au  Fact Sheet for Healthcare Providers:  IncredibleEmployment.be  This test is no t yet approved or cleared by the [place] FDA and  has been authorized for detection and/or diagnosis of SARS-CoV-2 by FDA under an Emergency Use Authorization (EUA). This EUA will remain  in effect (meaning this test can be used) for the duration of the COVID-19 declaration under Section 564(b)(1) of the Act, 21 U.S.C.section 360bbb-3(b)(1), unless the authorization is terminated  or revoked sooner.       Influenza A by PCR NEGATIVE NEGATIVE Final   Influenza B by PCR NEGATIVE NEGATIVE Final    Comment: (NOTE) The Xpert Xpress SARS-CoV-2/FLU/RSV plus assay is intended as an aid in the diagnosis of influenza from Nasopharyngeal swab specimens and should not be used as a sole basis for treatment. Nasal washings and aspirates are unacceptable for Xpert Xpress SARS-CoV-2/FLU/RSV testing.  Fact Sheet for Patients: EntrepreneurPulse.com.au  Fact Sheet for Healthcare Providers: IncredibleEmployment.be  This test is not yet approved or cleared by the [place] FDA and has been authorized for detection and/or diagnosis of SARS-CoV-2 by FDA under an Emergency Use Authorization (EUA). This EUA will remain in effect (meaning this test can be used) for the duration of the COVID-19 declaration under Section 564(b)(1) of the Act,  21 U.S.C. section 360bbb-3(b)(1), unless the authorization is terminated or revoked.  Performed at [facility], [address] [place], [place] [address]      Radiological Exams on Admission: DG Chest 2 View  Result Date: 01/03/2021 CLINICAL DATA:  Dyspnea EXAM: CHEST - 2 VIEW COMPARISON:  02/01/2013 chest radiograph. FINDINGS: Stable cardiomediastinal silhouette with normal heart size. No pneumothorax. Trace bilateral pleural effusions. Hyperinflated lungs. No pulmonary edema. Mild streaky bibasilar lung opacities. Mild hazy opacity in the right mid lung. IMPRESSION: 1. Mild hazy opacity in the right mid lung, cannot exclude mild/developing pneumonia. Follow-up chest radiograph suggested. 2. Mild streaky bibasilar lung opacities, favor atelectasis. 3. Hyperinflated lungs, suggesting COPD. 4. Trace bilateral pleural effusions. Electronically Signed   By: Janina Mayo.D.  On: 01/03/2021 07:59     EKG: I have personally reviewed.  Sinus rhythm, QTC 442, left atrial enlargement, nonspecific T wave change  Assessment/Plan Principal Problem:   Acute respiratory failure with hypoxia (HCC) Active Problems:   CAP (community acquired pneumonia)   Sepsis ([place])   COPD (chronic obstructive pulmonary disease) (HCC)   GERD (gastroesophageal reflux disease)   HTN (hypertension)   HLD (hyperlipidemia)   CHF exacerbation (HCC)   Elevated troponin   AKI (acute kidney injury) (HCC)   Normocytic anemia   Acute respiratory failure with hypoxia due to CAP and CHF exacerbation: Patient has a short breath, productive cough, leukocytosis, fever, chest x-ray showed right middle lobe infiltration, clinically consistent with CAP.  Patient was recently diagnosed with CHF, now has bilateral leg edema, positive JVD, elevated BNP 473, crackles on auscultation, clinically consistent with CHF exacerbation.  Patient has 2 L of new oxygen requirement.  -Admitted to progressive bed as  inpatient -Antibiotics for pneumonia -IV Lasix for CHF -Nasal cannula oxygen to maintain oxygen saturation above 93% -Bronchodilators  Sepsis due to CAP (community acquired pneumonia): Patient meets criteria for sepsis with WBC 12.1, heart rate 93 and fever of 100.4.  Lactic acid is normal.  Currently hemodynamically stable.  Patient is allergic to doxycycline and azithromycin.  Patient received 1 dose of vancomycin and Rocephin in ED.  Discussed with the pharmacist, patient has agitation and nervousness when she used Levaquin in the past, will switch patient to [place] IV per pharm - Mucinex for cough  - Bronchodilators - Urine legionella and S. pneumococcal antigen - Follow up blood culture x2, sputum culture and respiratory virus panel - will get Procalcitonin and trend lactic acid level per sepsis protocol - IVF: will not give IVF due to CHF exacerbation  CHF exacerbation: Clinically patient has CHF exacerbation.  No 2D echo was done yet.  Not sure which type of CHF, but given long history of hypertension, patient may have diastolic CHF.  Patient has sepsis and AKI, cannot treat patient with aggressive diuretics. Pt was given 1 dose of 40 mg of Lasix, will hold off further IV Lasix today and observe renal function and sepsis closely. -Lasix 40 mg x 1 -2d echo -Daily weights -strict I/O's -Obtain REDs Vest reading  Elevated troponin: Troponin level 105, 147, denies chest pain.  Likely due to demand ischemia. -Aspirin, Crestor -Trend troponin -Check A1c, FLP -Follow-up 2D echo  COPD (chronic obstructive pulmonary disease) ([place]): No wheezing or rhonchi on auscultation. -Bronchodilators  GERD (gastroesophageal reflux disease) -Protonix  HTN (hypertension) -Hold Cozaar due to AKI.  -IV hydralazine as needed  HLD (hyperlipidemia) -Crestor  AKI (acute kidney injury) ([place]): No recent baseline creatinine available.  Patient had creatinine 1.17 on 02/01/2013.  His  creatinine is 1.54, BUN 17. -Hold Cozaar -Judicious use of IV Lasix -Follow-up by BMP  Normocytic anemia: Hemoglobin 10.6. -Follow-up with CBC         DVT ppx: SQ Lovenox Code Status: Full code per pt and his wife Family Communication:  Yes, patient's wife   at bed side Disposition Plan:  Anticipate discharge back to previous environment Consults called:  none Admission status and Level of care: Progressive Cardiac:    as inpt      Status is: Inpatient  Remains inpatient appropriate because: Patient has multiple comorbidities, including recently diagnosed with CHF, who presents with acute respiratory failure with hypoxia due to CAP and CHF exacerbation.  Patient also has sepsis, elevated  troponin, AKI.  His presentation is highly complicated.  Given his older age, patient is at high risk of deteriorating.  Will need to be treated in the hospital for at least 2 days.           Date of Service 01/03/2021    Ivor Costa [facility]   If 7PM-7AM, please contact night-coverage [email or web address] 01/03/2021, 10:38 AM

## 2021-01-03 NOTE — ED Triage Notes (Signed)
Pt arrived via ems from home with SOB x 3 days. Recent diagnosis of CHF. Lower edema noted. Per ems pt a&o x 4.   89% RA, 98% on 2L  20 left Ac placed by EMS.

## 2021-01-03 NOTE — ED Triage Notes (Signed)
Pt presents to ER via ems c/o sob x1 week since his dx of CHF.  Pt states he does not wear O2 at home but tonight has had to be put on 2L [place].  Pt noted to have 1+ pitting edema to BIL LE.  Pt having trouble speaking in full sentences.

## 2021-01-04 ENCOUNTER — Inpatient Hospital Stay (HOSPITAL_COMMUNITY)
Admit: 2021-01-04 | Discharge: 2021-01-04 | Disposition: A | Discharge status: Home / Self Care | Payer: No Typology Code available for payment source | Attending: Internal Medicine | Admitting: Internal Medicine

## 2021-01-04 ENCOUNTER — Inpatient Hospital Stay: Payer: No Typology Code available for payment source

## 2021-01-04 DIAGNOSIS — I5031 Acute diastolic (congestive) heart failure: Secondary | ICD-10-CM | POA: Diagnosis not present

## 2021-01-04 DIAGNOSIS — I509 Heart failure, unspecified: Secondary | ICD-10-CM

## 2021-01-04 DIAGNOSIS — B952 Enterococcus as the cause of diseases classified elsewhere: Secondary | ICD-10-CM | POA: Diagnosis not present

## 2021-01-04 DIAGNOSIS — N179 Acute kidney failure, unspecified: Secondary | ICD-10-CM

## 2021-01-04 DIAGNOSIS — R7881 Bacteremia: Secondary | ICD-10-CM

## 2021-01-04 DIAGNOSIS — J9601 Acute respiratory failure with hypoxia: Secondary | ICD-10-CM | POA: Diagnosis not present

## 2021-01-04 LAB — BLOOD CULTURE ID PANEL (REFLEXED) - BCID2

## 2021-01-04 LAB — URINALYSIS, ROUTINE W REFLEX MICROSCOPIC
Bacteria, UA: NONE SEEN
Bilirubin Urine: NEGATIVE
Glucose, UA: NEGATIVE mg/dL
Ketones, ur: NEGATIVE mg/dL
Leukocytes,Ua: NEGATIVE
Nitrite: NEGATIVE
Protein, ur: NEGATIVE mg/dL
Specific Gravity, Urine: 1.01 (ref 1.005–1.030)
Squamous Epithelial / HPF: NONE SEEN (ref 0–5)
pH: 5 (ref 5.0–8.0)

## 2021-01-04 LAB — IRON AND TIBC
Iron: 18 ug/dL — ABNORMAL LOW (ref 45–182)
Saturation Ratios: 8 % — ABNORMAL LOW (ref 17.9–39.5)
TIBC: 225 ug/dL — ABNORMAL LOW (ref 250–450)
UIBC: 207 ug/dL

## 2021-01-04 LAB — FOLATE: Folate: 27 ng/mL (ref >5.9)

## 2021-01-04 LAB — LIPID PANEL
Cholesterol: 123 mg/dL (ref 0–200)
HDL: 13 mg/dL — ABNORMAL LOW (ref >40)
LDL Cholesterol: 86 mg/dL (ref 0–99)
Total CHOL/HDL Ratio: 9.5 RATIO
Triglycerides: 120 mg/dL (ref <150)
VLDL: 24 mg/dL (ref 0–40)

## 2021-01-04 LAB — RETICULOCYTES
Immature Retic Fract: 27 % — ABNORMAL HIGH (ref 2.3–15.9)
RBC.: 3.54 MIL/uL — ABNORMAL LOW (ref 4.22–5.81)
Retic Count, Absolute: 55.9 10*3/uL (ref 19.0–186.0)
Retic Ct Pct: 1.6 % (ref 0.4–3.1)

## 2021-01-04 LAB — ECHOCARDIOGRAM COMPLETE
AR max vel: 2.2 cm2
AV Area VTI: 1.95 cm2
AV Area mean vel: 2.09 cm2
AV Mean grad: 13.3 mmHg
AV Peak grad: 28.9 mmHg
Ao pk vel: 2.69 m/s
Area-P 1/2: 3.66 cm2
Height: 65 in
P 1/2 time: 362 msec
S' Lateral: 3.04 cm
Weight: 2752 oz

## 2021-01-04 LAB — BASIC METABOLIC PANEL
Anion gap: 8 (ref 5–15)
BUN: 20 mg/dL (ref 8–23)
CO2: 21 mmol/L — ABNORMAL LOW (ref 22–32)
Calcium: 8 mg/dL — ABNORMAL LOW (ref 8.9–10.3)
Chloride: 104 mmol/L (ref 98–111)
Creatinine, Ser: 1.44 mg/dL — ABNORMAL HIGH (ref 0.61–1.24)
GFR, Estimated: 49 mL/min — ABNORMAL LOW (ref >60)
Glucose, Bld: 111 mg/dL — ABNORMAL HIGH (ref 70–99)
Potassium: 4.3 mmol/L (ref 3.5–5.1)
Sodium: 133 mmol/L — ABNORMAL LOW (ref 135–145)

## 2021-01-04 LAB — FERRITIN: Ferritin: 288 ng/mL (ref 24–336)

## 2021-01-04 LAB — TROPONIN I (HIGH SENSITIVITY): Troponin I (High Sensitivity): 66 ng/L — ABNORMAL HIGH (ref <18)

## 2021-01-04 LAB — MAGNESIUM: Magnesium: 2.3 mg/dL (ref 1.7–2.4)

## 2021-01-04 IMAGING — US US RENAL
1 series · 14 of 25 positions shown · non-contrast
Comparison: None.

CLINICAL DATA: Acute kidney injury

EXAM:
RENAL / URINARY TRACT ULTRASOUND COMPLETE

[Series 1: us renal · 14 of 26 slices shown]
[im 1/26]
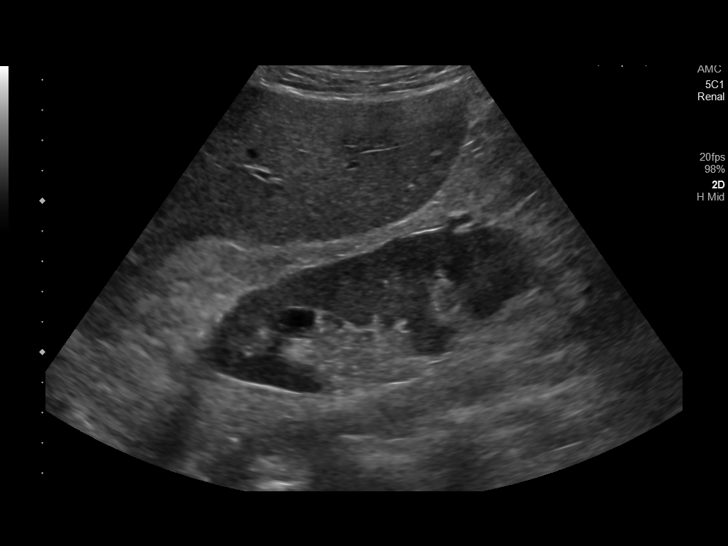
[im 3/26]
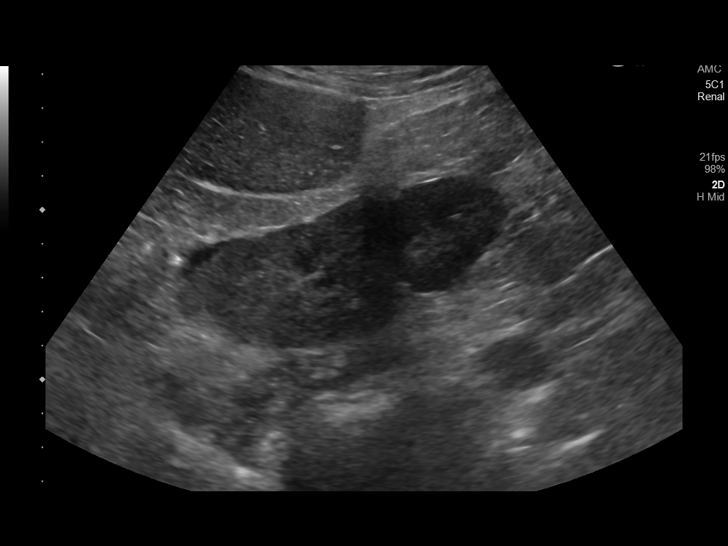
[im 5/26]
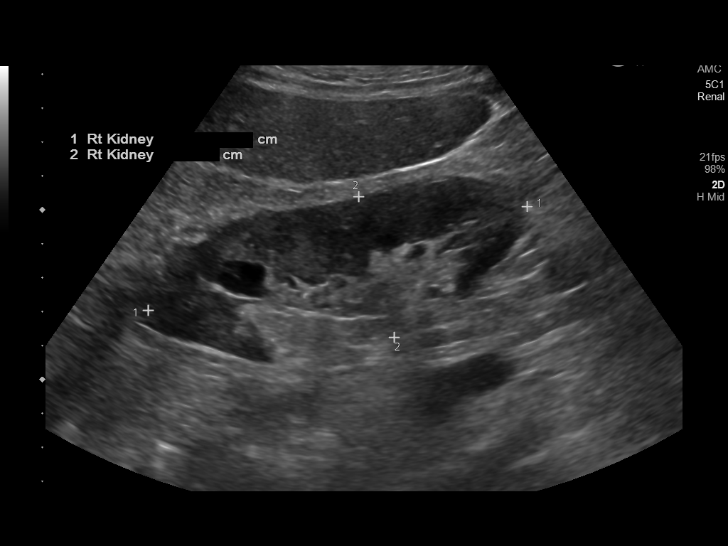
[im 7/26]
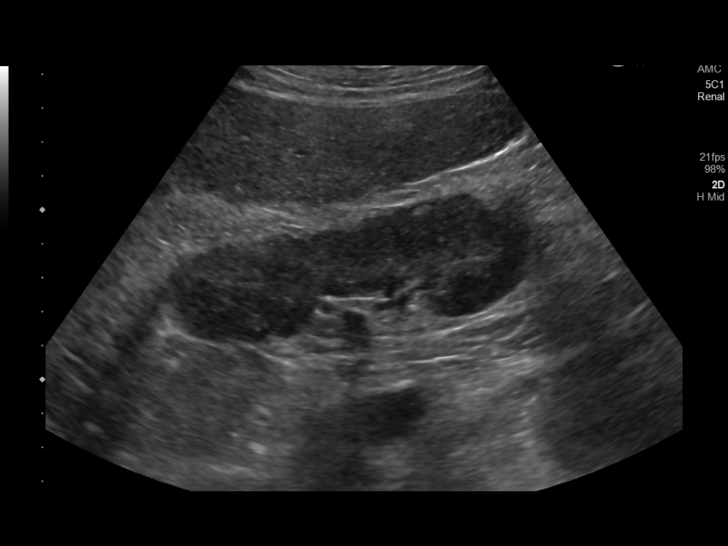
[im 9/26]
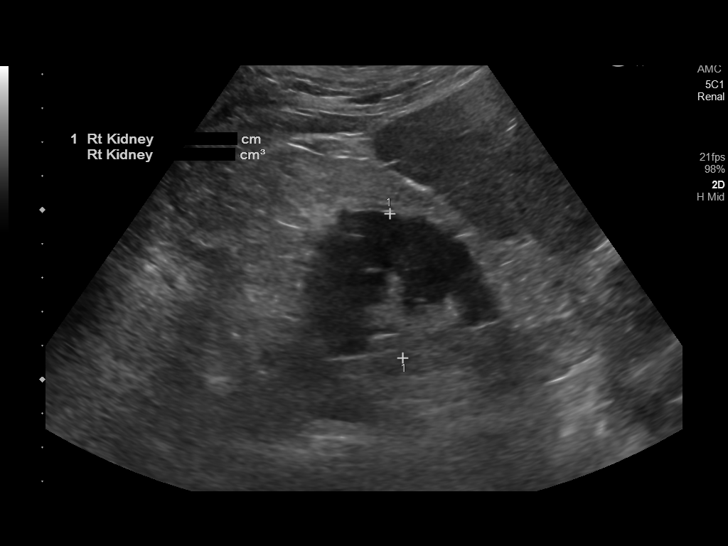
[im 10/26]
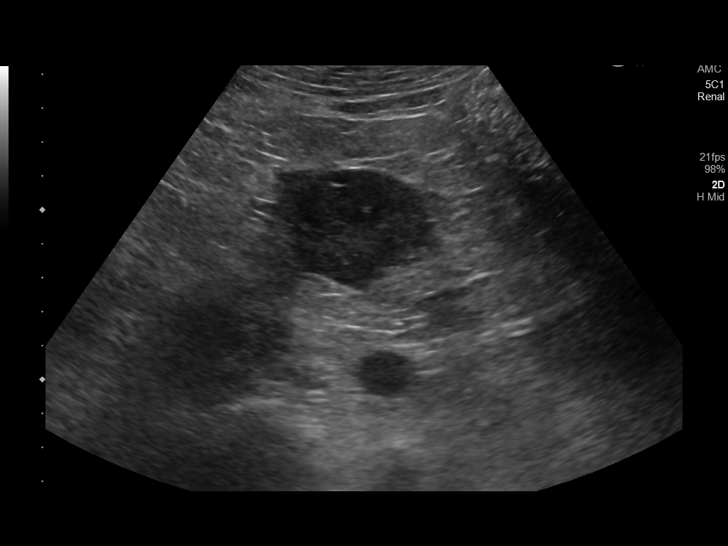
[im 12/26]
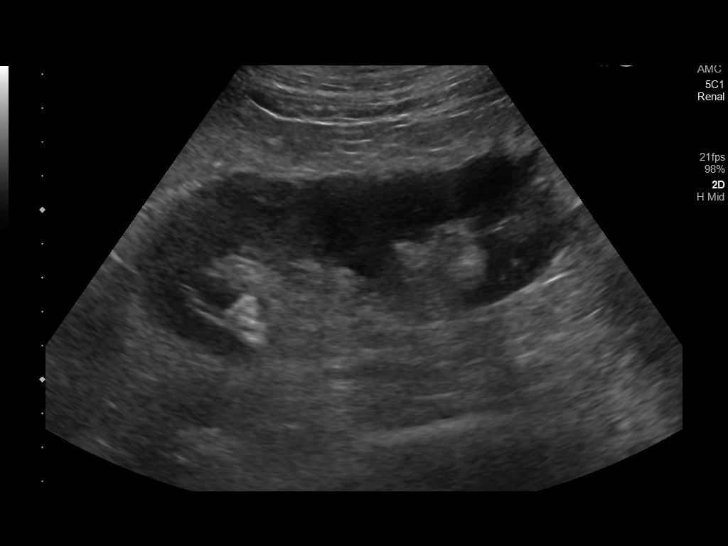
[im 14/26]
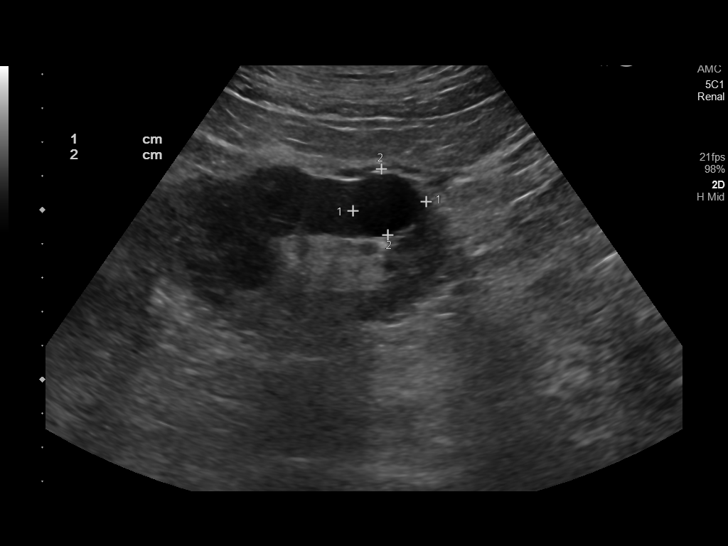
[im 16/26]
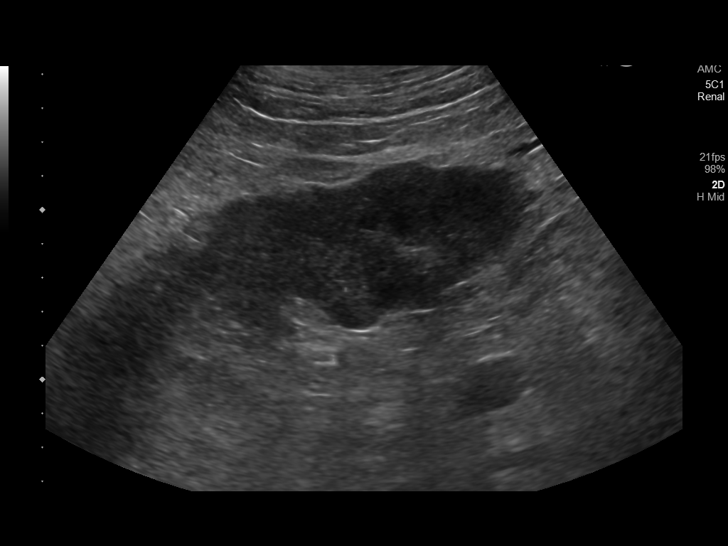
[im 17/26]
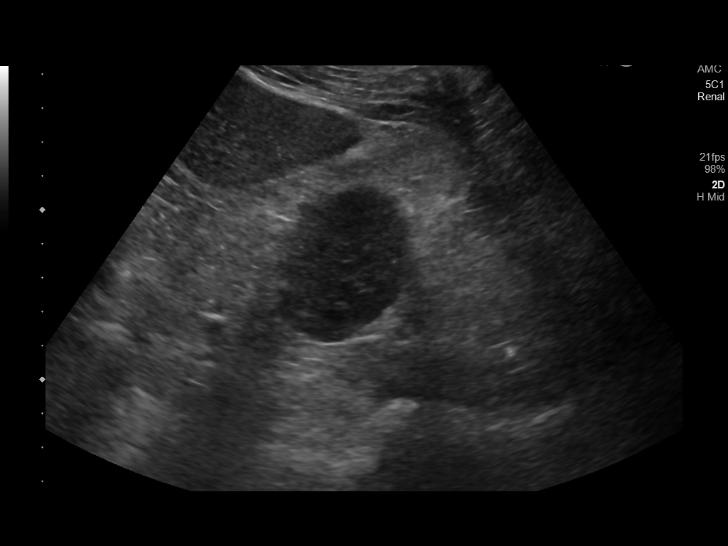
[im 19/26]
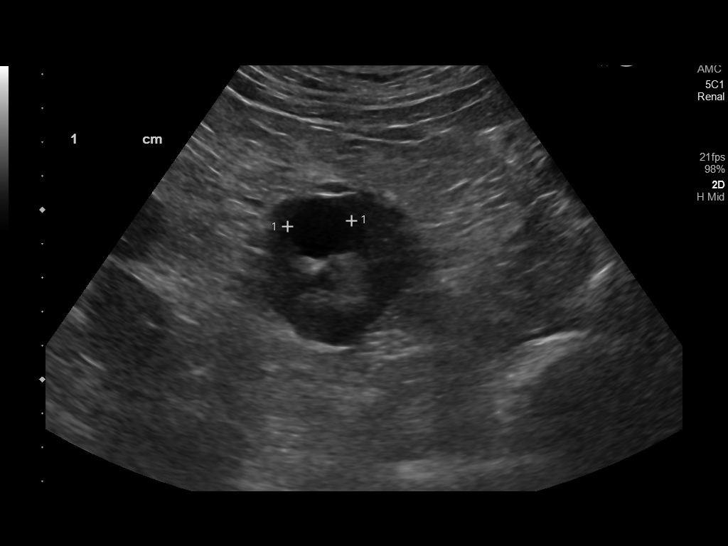
[im 21/26]
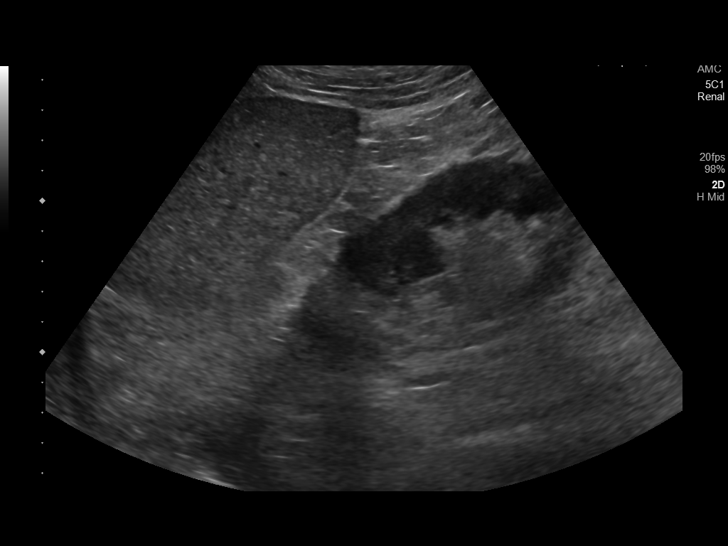
[im 23/26]
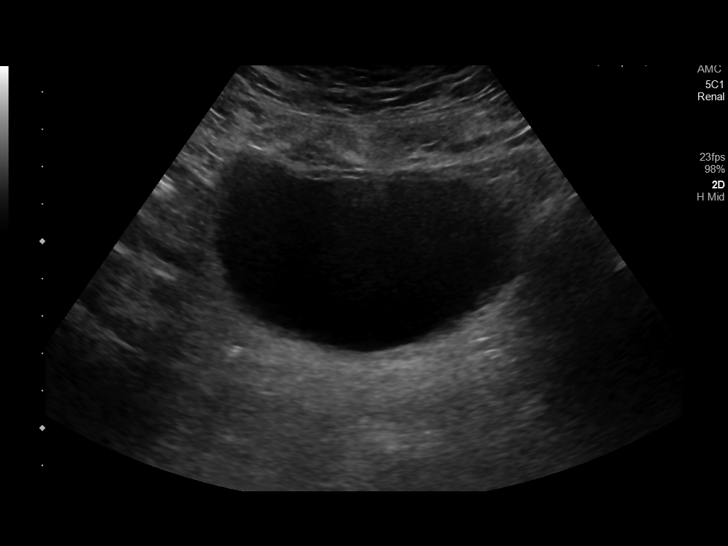
[im 26/26]
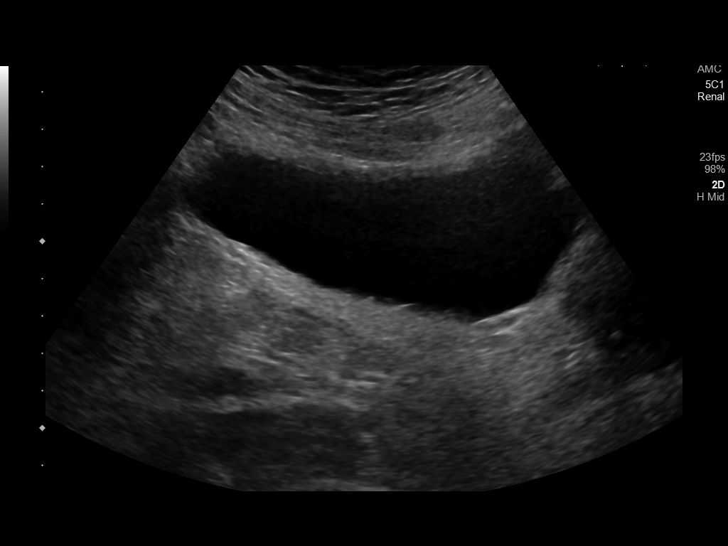

[14 of 25 positions shown; findings below may reference images not displayed]

FINDINGS: Right Kidney:

Renal measurements: 11.6 x 4.3 x 4.3 cm = volume: 111 mL.
Echogenicity within normal limits. No significant atrophy. No mass
or hydronephrosis visualized.

Left Kidney:

Renal measurements: 12.5 x 4.9 x 4.8 cm = volume: 153 mL. No
hydronephrosis. No significant atrophy. Simple cyst of the lower
pole measures up to 2.2 cm and has a benign appearance.

Bladder:

Appears normal for degree of bladder distention.

Other:

None.
IMPRESSION: No significant atrophy, hydronephrosis or evidence of abnormal renal
echogenicity. Benign simple cyst of the left kidney.

## 2021-01-04 MED ORDER — SODIUM CHLORIDE 0.9 % IV SOLN
2.0000 g | Freq: Two times a day (BID) | INTRAVENOUS | Status: DC
  Administered 2021-01-04 – 2021-01-06 (×4): 2 g via INTRAVENOUS
  Filled 2021-01-04 (×5): qty 20

## 2021-01-04 MED ORDER — SODIUM CHLORIDE 0.9 % IV SOLN
2.0000 g | Freq: Four times a day (QID) | INTRAVENOUS | Status: DC
  Filled 2021-01-04 (×4): qty 2000

## 2021-01-04 MED ORDER — SODIUM CHLORIDE 0.9 % IV SOLN
3.0000 g | Freq: Four times a day (QID) | INTRAVENOUS | Status: DC
  Administered 2021-01-04 – 2021-01-05 (×5): 3 g via INTRAVENOUS
  Filled 2021-01-04 (×5): qty 8
  Filled 2021-01-04 (×2): qty 3
  Filled 2021-01-04 (×2): qty 8
  Filled 2021-01-04: qty 3

## 2021-01-04 MED ORDER — SODIUM CHLORIDE 0.9 % IV BOLUS
500.0000 mL | Freq: Once | INTRAVENOUS | Status: AC
  Administered 2021-01-04: 500 mL via INTRAVENOUS

## 2021-01-04 MED ORDER — SODIUM CHLORIDE 0.9 % IV SOLN: INTRAVENOUS | Status: DC | PRN

## 2021-01-04 NOTE — Consult Note (Addendum)
PHARMACY - PHYSICIAN COMMUNICATION CRITICAL VALUE ALERT - BLOOD CULTURE IDENTIFICATION (BCID)  Phillip Chavez is an 80 y.o. male who presented to [facility] on 01/03/2021 with a chief complaint of pneumonia.  Assessment:  BCID results: 3 of 4 bottles with Enterococcus faecalis. Pt currently on Levaquin. Pt does not have any documented allergies to PCNs or Cephalosporins.  Cultures with GPCs in 4 of 4 vials (aero & anaero)  Name of physician (or Provider) Contacted: Dr. Reesa Chew  Current antibiotics: Levaquin 750mg  IV q48h  Changes to prescribed antibiotics recommended:  Unasyn 3g IV q6h per current renal fxn and indication Recommendations accepted by provider  Results for orders placed or performed during the hospital encounter of 01/03/21  Blood Culture ID Panel (Reflexed) (Collected: 01/03/2021  9:17 AM)  Result Value Ref Range   Enterococcus faecalis DETECTED (A) NOT DETECTED   Enterococcus Faecium NOT DETECTED NOT DETECTED   Listeria monocytogenes NOT DETECTED NOT DETECTED   Staphylococcus species NOT DETECTED NOT DETECTED   Staphylococcus aureus (BCID) NOT DETECTED NOT DETECTED   Staphylococcus epidermidis NOT DETECTED NOT DETECTED   Staphylococcus lugdunensis NOT DETECTED NOT DETECTED   Streptococcus species NOT DETECTED NOT DETECTED   Streptococcus agalactiae NOT DETECTED NOT DETECTED   Streptococcus pneumoniae NOT DETECTED NOT DETECTED   Streptococcus pyogenes NOT DETECTED NOT DETECTED   A.calcoaceticus-baumannii NOT DETECTED NOT DETECTED   Bacteroides fragilis NOT DETECTED NOT DETECTED   Enterobacterales NOT DETECTED NOT DETECTED   Enterobacter cloacae complex NOT DETECTED NOT DETECTED   Escherichia coli NOT DETECTED NOT DETECTED   Klebsiella aerogenes NOT DETECTED NOT DETECTED   Klebsiella oxytoca NOT DETECTED NOT DETECTED   Klebsiella pneumoniae NOT DETECTED NOT DETECTED   Proteus species NOT DETECTED NOT DETECTED   Salmonella species NOT DETECTED NOT DETECTED   Serratia  marcescens NOT DETECTED NOT DETECTED   Haemophilus influenzae NOT DETECTED NOT DETECTED   Neisseria meningitidis NOT DETECTED NOT DETECTED   Pseudomonas aeruginosa NOT DETECTED NOT DETECTED   Stenotrophomonas maltophilia NOT DETECTED NOT DETECTED   Candida albicans NOT DETECTED NOT DETECTED   Candida auris NOT DETECTED NOT DETECTED   Candida glabrata NOT DETECTED NOT DETECTED   Candida krusei NOT DETECTED NOT DETECTED   Candida parapsilosis NOT DETECTED NOT DETECTED   Candida tropicalis NOT DETECTED NOT DETECTED   Cryptococcus neoformans/gattii NOT DETECTED NOT DETECTED   Vancomycin resistance NOT DETECTED NOT DETECTED    Lorna Dibble 01/04/2021  7:38 AM

## 2021-01-04 NOTE — ED Notes (Signed)
Pt states he is allergic to steroids per [facility] hospital. Pt states they cause his throat to swell.

## 2021-01-04 NOTE — Evaluation (Signed)
Physical Therapy Evaluation Patient Details Name: Phillip Chavez MRN: [id number] DOB: 1940-07-03 Today's Date: 01/04/2021  History of Present Illness  Phillip Chavez is a n 75yoM coming to [facility] ED 01/03/21 c 3d SOB, LEE, 89% on room air. Recent diagnosis of CHF PTA. PMH: CHF, HTN, COPD. Pt recent treated for URI. Workup suggesetive of RML CAP. HS tropnonin elevated, attributed to demand/supply mismatch.  Clinical Impression  Pt admitted with above diagnosis. Pt currently with functional limitations due to the deficits listed below (see "PT Problem List"). Upon entry, pt in bed, awake and agreeable to participate, asking for assistance to BR. The pt is alert, pleasant, interactive, and able to provide info regarding prior level of function, both in tolerance and independence. Pt assisted to BR, where he performs his toiletting independently. Pt tolerates standing x2 minutes while lines/leads are untangled and staged for AMB. Pt AMB 2 laps in room prior to request to take a recovery interval due to progressive dyspnea. Pt has mild increased sway, moderate imbalance subjectively. VSS on room air, no clear correlation to dyspnea complaints. Entire session performed on room air, pt left on room air at exit. Patient's performance this date reveals decreased ability, independence, and tolerance in performing all basic mobility required for performance of activities of daily living. Pt requires additional DME, close physical assistance, and cues for safe participate in mobility. Pt will benefit from skilled PT intervention to increase independence and safety with basic mobility in preparation for discharge to the venue listed below.    Per pt these deficits and limitations are only mildly worse than what he has been experiencing for ~1 month since onset of his URI. He continues with intermittent chronic cough.     Recommendations for follow up therapy are one component of a multi-disciplinary discharge planning  process, led by the attending physician.  Recommendations may be updated based on patient status, additional functional criteria and insurance authorization.  Follow Up Recommendations Home health PT    Assistance Recommended at Discharge Set up Supervision/Assistance  Functional Status Assessment Patient has had a recent decline in their functional status and demonstrates the ability to make significant improvements in function in a reasonable and predictable amount of time.  Equipment Recommendations  None recommended by PT    Recommendations for Other Services       Precautions / Restrictions Precautions Precautions: Fall      Mobility  Bed Mobility Overal bed mobility: Modified Independent                  Transfers Overall transfer level: Modified independent Equipment used: None                    Ambulation/Gait Ambulation/Gait assistance: Supervision;Min guard Gait Distance (Feet): 100 Feet Assistive device: None       General Gait Details: has progressive dyspnea while up AMB, no desaturation, no tachycardia.  Stairs            Wheelchair Mobility    Modified Rankin (Stroke Patients Only)       Balance Overall balance assessment: Modified Independent;Mild deficits observed, not formally tested                                           Pertinent Vitals/Pain Pain Assessment: No/denies pain    Home Living Family/patient expects to be discharged to:: Private residence  Living Arrangements: Spouse/significant other Available Help at Discharge: Family Type of Home: House Home Access: Stairs to enter   Entrance Stairs-Number of Steps: 4     Home Equipment: Conservation officer, nature (2 wheels)      Prior Function Prior Level of Function : Independent/Modified Independent             Mobility Comments: until he become ill last month, was going to the gym regularly, walking up to 3 miles. no device ADLs Comments:  independent     Hand Dominance        Extremity/Trunk Assessment                Communication      Cognition Arousal/Alertness: Awake/alert Behavior During Therapy: WFL for tasks assessed/performed Overall Cognitive Status: Within Functional Limits for tasks assessed                                          General Comments      Exercises     Assessment/Plan    PT Assessment Patient needs continued PT services  PT Problem List Decreased activity tolerance;Decreased balance;Decreased mobility       PT Treatment Interventions Balance training;Functional mobility training;Therapeutic activities;Therapeutic exercise;Patient/family education    PT Goals (Current goals can be found in the Care Plan section)  Acute Rehab PT Goals Patient Stated Goal: not feel like an old man anymore PT Goal Formulation: With patient Time For Goal Achievement: 01/18/21 Potential to Achieve Goals: Good    Frequency Min 2X/week   Barriers to discharge        Co-evaluation               AM-PAC PT "6 Clicks" Mobility  Outcome Measure Help needed turning from your back to your side while in a flat bed without using bedrails?: None Help needed moving from lying on your back to sitting on the side of a flat bed without using bedrails?: None Help needed moving to and from a bed to a chair (including a wheelchair)?: A Little Help needed standing up from a chair using your arms (e.g., wheelchair or bedside chair)?: A Little Help needed to walk in hospital room?: A Little Help needed climbing 3-5 steps with a railing? : A Little 6 Click Score: 20    End of Session Equipment Utilized During Treatment: Oxygen Activity Tolerance: Patient tolerated treatment well;Patient limited by fatigue Patient left: in chair;with nursing/sitter in room;with family/visitor present Nurse Communication: Mobility status PT Visit Diagnosis: Difficulty in walking, not elsewhere  classified (R26.2);Unsteadiness on feet (R26.81)    Time: 8295-6213 PT Time Calculation (min) (ACUTE ONLY): 15 min   Charges:   PT Evaluation $PT Eval Low Complexity: 1 Low         9:39 AM, 01/04/21 Etta Grandchild, PT, DPT Physical Therapist - [facility]  [phone] ([place])    Flora Ratz C 01/04/2021, 9:36 AM

## 2021-01-04 NOTE — Progress Notes (Signed)
*  PRELIMINARY RESULTS* Echocardiogram 2D Echocardiogram has been performed.  Phillip Chavez 01/04/2021, 7:45 AM

## 2021-01-04 NOTE — ED Notes (Signed)
PT removed pt nasal canula at 0930 when they were in room with pt. Pt maintained oxygen saturation above 92% since. Current saturation at this time is 95%. Pt wife reports concern that pt oxygen removed too quickly. Pt currently denied feeling SOB. RN left [place] at bedside on 2L and instructed pt to use if he felt like he was having trouble breathing. Pt and wife instructed to notify this RN if pt starts to feel SOB, or if he feels that he needs oxygen due to work of breathing. Pt also given incentive spirometer at this time, and instructed on use. Pt demonstrated use.

## 2021-01-04 NOTE — Progress Notes (Signed)
PROGRESS NOTE    GAMAL TODISCO  FYT:[id number] DOB: 1940-08-09 DOA: 01/03/2021 PCP: [place]   Brief Narrative: Taken from H&P. Phillip Chavez is a 80 y.o. male with medical history significant of hypertension, hyperlipidemia, COPD not on oxygen at home, GERD, recently diagnosed CHF in [place], who presents with shortness of breath.   Patient states that he has been having upper respiratory symptoms of for almost a month. He was initially seen at the [place] and diagnosed with viral upper respiratory tract infection.  This lasted for about 10 days and he seemed to get somewhat better but then symptoms returned in past week.  He has shortness of breath, productive cough with greenish colored sputum production. Patient has fever and chills.  Patient also has worsening bilateral lower extremity edema and orthopnea.  Per patient he was recently diagnosed with CHF but no echo done yet.  He was also hypoxic at 89% on arrival to ED which improved to high 90s on 2 L of oxygen. He met sepsis criteria with temperature of 100.4, leukocytosis and chest x-ray concerning for right middle lobe infiltrate and streaky bilateral basilar opacities.  AKI with creatinine of 1.64, no recent baseline, prior creatinine in the system was 1.17 in December 2014. Preliminary blood cultures with Enterococcus faecalis. Initially started on Levaquin which were switched to Unasyn . Echo with normal EF, no regional wall motion abnormalities, normal diastolic function and mild aortic stenosis.  Subjective: Patient was feeling weak and continued to have some cough.  He was concerned that he is allergic to steroid and refusing DuoNeb, okay with albuterol nebulizer.  Wife at bedside.  Assessment & Plan:   Principal Problem:   Acute respiratory failure with hypoxia (HCC) Active Problems:   CAP (community acquired pneumonia)   Sepsis ([place])   COPD (chronic obstructive pulmonary disease) (HCC)   GERD (gastroesophageal  reflux disease)   HTN (hypertension)   HLD (hyperlipidemia)   CHF exacerbation (HCC)   Elevated troponin   AKI (acute kidney injury) ([place])   Normocytic anemia  Sepsis secondary to CAP and Enterococcus faecalis bacteremia.  Met sepsis criteria with fever, leukocytosis and concern of pneumonia, later blood cultures came back positive for Enterococcus faecalis.  Patient received 1 dose of vancomycin and Rocephin in ED and then started on Levaquin. Levaquin was switched with Unasyn  based on preliminary blood cultures.  Azithromycin allergies are also listed. -Continue with Unasyn  -Follow-up final blood culture results. -Continue with supportive care -Try weaning from oxygen.  Acute respiratory failure with hypoxia secondary to pneumonia and CHF exacerbation.  Patient was hypoxic in high 80s on room air requiring up to 2 L of oxygen.  No baseline oxygen requirement.  JVD elevated, bilateral lower extremity edema and BNP at 473 with crackles on auscultation on admission which met criteria with CHF exacerbation.  Echocardiogram within normal limits with mild aortic stenosis. -Try weaning from oxygen as tolerated  CHF exacerbation.  Met criteria for CHF exacerbation as described above.  Received 1 dose of IV Lasix, which was not continued due to concern of sepsis. -Echocardiogram with normal EF and diastolic parameters, mild aortic stenosis. -Daily BMP and weight -Strict intake and output  Elevated troponin.  Peaked at 147 and now trending down.  Likely secondary to demand ischemia with CHF exacerbation.  No chest pain. -Continue with aspirin and Crestor.  COPD.  No wheezing on admission. -Continue with bronchodilators  Hypertension.  Blood pressure within goal. -Keep holding home  dose of Cozaar for concern of AKI. -IV hydralazine as needed  AKI (acute kidney injury) ([place]): No recent baseline creatinine available.  Patient had creatinine 1.17 on 02/01/2013.  It was 1.64 on admission and now  started trending down to 1.44. -Get renal ultrasound. -Monitor renal function -Avoid nephrotoxins  Normocytic anemia: Hemoglobin 10.6. -Follow-up with CBC -Anemia panel  GERD (gastroesophageal reflux disease) -Protonix  HLD (hyperlipidemia) -Crestor  Generalized weakness. -PT/OT evaluation  Objective: Vitals:   01/03/21 2248 01/04/21 0215 01/04/21 0230 01/04/21 0430  BP: 126/87 (!) 142/39 (!) 132/43 (!) 133/37  Pulse: 66 75 80 81  Resp: (!) 26 (!) 23 16 (!) 21  Temp: 98 F (36.7 C)     TempSrc: Oral     SpO2: 99% 98% 100% 100%  Weight:      Height:        Intake/Output Summary (Last 24 hours) at 01/04/2021 0744 Last data filed at 01/03/2021 1500 Gross per 24 hour  Intake 489.48 ml  Output --  Net 489.48 ml   Filed Weights   01/03/21 0603  Weight: 78 kg    Examination:  General exam: Appears calm and comfortable  Respiratory system: Few Rales at bases, respiratory effort normal. Cardiovascular system: S1 & S2 heard, RRR.  Gastrointestinal system: Soft, nontender, nondistended, bowel sounds positive. Central nervous system: Alert and oriented. No focal neurological deficits. Extremities: No edema, no cyanosis, pulses intact and symmetrical. Psychiatry: Judgement and insight appear normal.   DVT prophylaxis: Lovenox Code Status: Full Family Communication: Discussed with wife and patient at bedside. Disposition Plan:  Status is: Inpatient  Remains inpatient appropriate because:  of the severity of illness  Level of care: Progressive Cardiac  All the records are reviewed and case discussed with Care Management/Social Worker. Management plans discussed with the patient, nursing and they are in agreement.  Consultants:  None  Procedures:  Antimicrobials:  Unasyn  Data Reviewed: I have personally reviewed following labs and imaging studies  CBC: Recent Labs  Lab 01/03/21 0605  WBC 12.1*  NEUTROABS 9.3*  HGB 10.6*  HCT 32.2*  MCV 86.8  PLT 897    Basic Metabolic Panel: Recent Labs  Lab 01/03/21 0605 01/03/21 1046 01/04/21 0544  NA 130* 131* 133*  K 4.3 3.8 4.3  CL 99 100 104  CO2 24 22 21*  GLUCOSE 119* 132* 111*  BUN _0 CREATININE 1.54* 1.64* 1.44*  CALCIUM 8.2* 8.0* 8.0*  MG  --   --  2.3   GFR: Estimated Creatinine Clearance: 38.8 mL/min (A) (by C-G formula based on SCr of 1.44 mg/dL (H)). Liver Function Tests: Recent Labs  Lab 01/03/21 1046  AST 66*  ALT 85*  ALKPHOS 87  BILITOT 1.3*  PROT 6.7  ALBUMIN 2.5*   No results for input(s): LIPASE, AMYLASE in the last 168 hours. No results for input(s): AMMONIA in the last 168 hours. Coagulation Profile: No results for input(s): INR, PROTIME in the last 168 hours. Cardiac Enzymes: No results for input(s): CKTOTAL, CKMB, CKMBINDEX, TROPONINI in the last 168 hours. BNP (last 3 results) No results for input(s): PROBNP in the last 8760 hours. HbA1C: No results for input(s): HGBA1C in the last 72 hours. CBG: No results for input(s): GLUCAP in the last 168 hours. Lipid Profile: Recent Labs    01/04/21 0544  CHOL 123  HDL 13*  LDLCALC 86  TRIG 120  CHOLHDL 9.5   Thyroid Function Tests: No results for input(s): TSH, T4TOTAL, FREET4, T3FREE,  THYROIDAB in the last 72 hours. Anemia Panel: No results for input(s): VITAMINB12, FOLATE, FERRITIN, TIBC, IRON, RETICCTPCT in the last 72 hours. Sepsis Labs: Recent Labs  Lab 01/03/21 0917 01/03/21 1046  PROCALCITON  --  0.18  LATICACIDVEN 1.0  --     Recent Results (from the past 240 hour(s))  Resp Panel by RT-PCR (Flu A&B, Covid) Nasopharyngeal Swab     Status: None   Collection Time: 01/03/21  6:06 AM   Specimen: Nasopharyngeal Swab; Nasopharyngeal(NP) swabs in vial transport medium  Result Value Ref Range Status   SARS Coronavirus 2 by RT PCR NEGATIVE NEGATIVE Final    Comment: (NOTE) SARS-CoV-2 target nucleic acids are NOT DETECTED.  The SARS-CoV-2 RNA is generally detectable in upper  respiratory specimens during the acute phase of infection. The lowest concentration of SARS-CoV-2 viral copies this assay can detect is 138 copies/mL. A negative result does not preclude SARS-Cov-2 infection and should not be used as the sole basis for treatment or other patient management decisions. A negative result may occur with  improper specimen collection/handling, submission of specimen other than nasopharyngeal swab, presence of viral mutation(s) within the areas targeted by this assay, and inadequate number of viral copies(<138 copies/mL). A negative result must be combined with clinical observations, patient history, and epidemiological information. The expected result is Negative.  Fact Sheet for Patients:  EntrepreneurPulse.com.au  Fact Sheet for Healthcare Providers:  IncredibleEmployment.be  This test is no t yet approved or cleared by the [place] FDA and  has been authorized for detection and/or diagnosis of SARS-CoV-2 by FDA under an Emergency Use Authorization (EUA). This EUA will remain  in effect (meaning this test can be used) for the duration of the COVID-19 declaration under Section 564(b)(1) of the Act, 21 U.S.C.section 360bbb-3(b)(1), unless the authorization is terminated  or revoked sooner.       Influenza A by PCR NEGATIVE NEGATIVE Final   Influenza B by PCR NEGATIVE NEGATIVE Final    Comment: (NOTE) The Xpert Xpress SARS-CoV-2/FLU/RSV plus assay is intended as an aid in the diagnosis of influenza from Nasopharyngeal swab specimens and should not be used as a sole basis for treatment. Nasal washings and aspirates are unacceptable for Xpert Xpress SARS-CoV-2/FLU/RSV testing.  Fact Sheet for Patients: EntrepreneurPulse.com.au  Fact Sheet for Healthcare Providers: IncredibleEmployment.be  This test is not yet approved or cleared by the [place] FDA and has been  authorized for detection and/or diagnosis of SARS-CoV-2 by FDA under an Emergency Use Authorization (EUA). This EUA will remain in effect (meaning this test can be used) for the duration of the COVID-19 declaration under Section 564(b)(1) of the Act, 21 U.S.C. section 360bbb-3(b)(1), unless the authorization is terminated or revoked.  Performed at [facility], [address] [place], [place] [address]   Blood culture (routine x 2)     Status: None (Preliminary result)   Collection Time: 01/03/21  9:17 AM   Specimen: BLOOD  Result Value Ref Range Status   Specimen Description BLOOD LEFT ANTECUBITAL  Final   Special Requests   Final    BOTTLES DRAWN AEROBIC AND ANAEROBIC Blood Culture adequate volume   Culture  Setup Time   Final    GRAM POSITIVE COCCI IN BOTH AEROBIC AND ANAEROBIC BOTTLES CRITICAL RESULT CALLED TO, READ BACK BY AND VERIFIED WITH: NATHAN BELUE AT 3295 01/04/21.PMF Performed at [facility], [address], [place], [place] [address]    [place]  Final  Report Status PENDING  Incomplete  Blood culture (routine x 2)     Status: None (Preliminary result)   Collection Time: 01/03/21  9:17 AM   Specimen: BLOOD LEFT HAND  Result Value Ref Range Status   Specimen Description BLOOD LEFT HAND  Final   Special Requests   Final    BOTTLES DRAWN AEROBIC AND ANAEROBIC Blood Culture adequate volume   Culture  Setup Time   Final    Organism ID to follow GRAM POSITIVE COCCI IN BOTH AEROBIC AND ANAEROBIC BOTTLES CRITICAL RESULT CALLED TO, READ BACK BY AND VERIFIED WITH: NATHAN BELUE AT 8657 01/04/21.PMF Performed at [facility], [address] [place], [place] [address]    Culture [facility] POSITIVE COCCI  Final   Report Status PENDING  Incomplete  Blood Culture ID Panel (Reflexed)     Status: Abnormal   Collection Time: 01/03/21  9:17 AM  Result Value Ref Range Status   Enterococcus faecalis DETECTED (A) NOT DETECTED Final     Comment: CRITICAL RESULT CALLED TO, READ BACK BY AND VERIFIED WITH: NATHAN BELUE AT 2952 01/04/21.PMF    Enterococcus Faecium NOT DETECTED NOT DETECTED Final   Listeria monocytogenes NOT DETECTED NOT DETECTED Final   Staphylococcus species NOT DETECTED NOT DETECTED Final   Staphylococcus aureus (BCID) NOT DETECTED NOT DETECTED Final   Staphylococcus epidermidis NOT DETECTED NOT DETECTED Final   Staphylococcus lugdunensis NOT DETECTED NOT DETECTED Final   Streptococcus species NOT DETECTED NOT DETECTED Final   Streptococcus agalactiae NOT DETECTED NOT DETECTED Final   Streptococcus pneumoniae NOT DETECTED NOT DETECTED Final   Streptococcus pyogenes NOT DETECTED NOT DETECTED Final   A.calcoaceticus-baumannii NOT DETECTED NOT DETECTED Final   Bacteroides fragilis NOT DETECTED NOT DETECTED Final   Enterobacterales NOT DETECTED NOT DETECTED Final   Enterobacter cloacae complex NOT DETECTED NOT DETECTED Final   Escherichia coli NOT DETECTED NOT DETECTED Final   Klebsiella aerogenes NOT DETECTED NOT DETECTED Final   Klebsiella oxytoca NOT DETECTED NOT DETECTED Final   Klebsiella pneumoniae NOT DETECTED NOT DETECTED Final   Proteus species NOT DETECTED NOT DETECTED Final   Salmonella species NOT DETECTED NOT DETECTED Final   Serratia marcescens NOT DETECTED NOT DETECTED Final   Haemophilus influenzae NOT DETECTED NOT DETECTED Final   Neisseria meningitidis NOT DETECTED NOT DETECTED Final   Pseudomonas aeruginosa NOT DETECTED NOT DETECTED Final   Stenotrophomonas maltophilia NOT DETECTED NOT DETECTED Final   Candida albicans NOT DETECTED NOT DETECTED Final   Candida auris NOT DETECTED NOT DETECTED Final   Candida glabrata NOT DETECTED NOT DETECTED Final   Candida krusei NOT DETECTED NOT DETECTED Final   Candida parapsilosis NOT DETECTED NOT DETECTED Final   Candida tropicalis NOT DETECTED NOT DETECTED Final   Cryptococcus neoformans/gattii NOT DETECTED NOT DETECTED Final   Vancomycin  resistance NOT DETECTED NOT DETECTED Final    Comment: Performed at [facility], [address], [place], [place] [address]     Radiology Studies: DG Chest 2 View  Result Date: 01/03/2021 CLINICAL DATA:  Dyspnea EXAM: CHEST - 2 VIEW COMPARISON:  02/01/2013 chest radiograph. FINDINGS: Stable cardiomediastinal silhouette with normal heart size. No pneumothorax. Trace bilateral pleural effusions. Hyperinflated lungs. No pulmonary edema. Mild streaky bibasilar lung opacities. Mild hazy opacity in the right mid lung. IMPRESSION: 1. Mild hazy opacity in the right mid lung, cannot exclude mild/developing pneumonia. Follow-up chest radiograph suggested. 2. Mild streaky bibasilar lung opacities, favor atelectasis. 3. Hyperinflated lungs, suggesting COPD. 4. Trace bilateral pleural  effusions. Electronically Signed   By: Ilona Sorrel M.D.   On: 01/03/2021 07:59    Scheduled Meds:  aspirin EC  81 mg Oral Daily   cholecalciferol  1,000 Units Oral Daily   enoxaparin (LOVENOX) injection  40 mg Subcutaneous QHS   ipratropium-albuterol  3 mL Nebulization Q4H   multivitamin with minerals  1 tablet Oral Daily   omega-3 acid ethyl esters  1 g Oral Daily   pantoprazole  40 mg Oral Daily   rosuvastatin  40 mg Oral QHS   Continuous Infusions:  ampicillin (OMNIPEN) IV       LOS: 1 day   Time spent: 42 minutes. More than 50% of the time was spent in counseling/coordination of care  Lorella Nimrod, MD [facility]  If 7PM-7AM, please contact night-coverage [email or web address]  01/04/2021, 7:44 AM   This record has been created using Systems analyst. Errors have been sought and corrected,but may not always be located. Such creation errors do not reflect on the standard of care.

## 2021-01-04 NOTE — Consult Note (Signed)
NAME: Phillip Chavez  DOB: Feb 10, 1941  MRN: [id number]  Date/Time: 01/04/2021 4:10 PM  REQUESTING PROVIDER: Dr.Amin Subjective:  REASON FOR CONSULT: enterococcus bacteremia ? Phillip Chavez is a 80 y.o. male with a history of newly diagnosed CHF, prediabetes, hypertension, COPD Presents from home on 01/03/2021 for shortness of breath  and cough.  As per patient this has been going on for a month and he initially was seen at [facility] for fever  and diagnosed with a viral  respiratory tract infection.  This lasted for about 10 days and he seemed to have gotten better but then the symptoms returned. He developed SOB and edema legs and went to see his PCP  . HE was hospitalized again and while being discharged and developed neck pain and was asked to take tylenol.  HE has Significant dyspnea on exertion as well as lying flat.  Pain in the right upper back which he attributes to compression fracture of the thoracic vertebra.   Last week he was told that he had heart failure and was started on Lasix.  He has not had echocardiogram yet. Pt has had constipation for the past 3 months- one time he could not pass urine and had to have a foley catheter for a short period. Phillip Chavez is nto able to pass urine freely like before- has reduced output- no pain recently has had watery stools with mucus/ last colonoscopy 4 yrs ago- polyps removed No fever in the past week   In the ED vitals BP 126/87, temperature 98, heart rate 66, respiratory 26 and sats of 99% on 2 L oxygen. WBC 12.1, Hb 10.6, platelet 245 and creatinine 1.64. 2 sets of blood cultures were drawn and was positive for Enterococcus . I am seeing him for the bacteremia Past Medical History:  Diagnosis Date   Chronic CHF (congestive heart failure) (HCC)    COPD (chronic obstructive pulmonary disease) (HCC)    GERD (gastroesophageal reflux disease)    HLD (hyperlipidemia)    HTN (hypertension)     Past Surgical History:  Procedure Laterality Date   dental  procedure      Social History   Socioeconomic History   Marital status: Married    Spouse name: Not on file   Number of children: Not on file   Years of education: Not on file   Highest education level: Not on file  Occupational History   Not on file  Tobacco Use   Smoking status: Former    Packs/day: 1.00    Years: 20.00    Pack years: 20.00    Types: Cigarettes    Quit date: 03/21/1978    Years since quitting: 42.8   Smokeless tobacco: Never  Substance and Sexual Activity   Alcohol use: Not Currently   Drug use: Never   Sexual activity: Not on file  Other Topics Concern   Not on file  Social History Narrative   Not on file   Social Determinants of Health   Financial Resource Strain: Not on file  Food Insecurity: Not on file  Transportation Needs: Not on file  Physical Activity: Not on file  Stress: Not on file  Social Connections: Not on file  Intimate Partner Violence: Not on file    Family History  Problem Relation Age of Onset   Prostate cancer Father    Prostate cancer Brother    Pancreatic cancer Brother    Allergies  Allergen Reactions   Azithromycin    Doxycycline  Sulfa Antibiotics Rash and Hypertension   I? Current Facility-Administered Medications  Medication Dose Route Frequency Provider Last Rate Last Admin   acetaminophen (TYLENOL) tablet 650 mg  650 mg Oral Q6H PRN Ivor Costa, MD       albuterol (PROVENTIL) (2.5 MG/3ML) 0.083% nebulizer solution 2.5 mg  2.5 mg Nebulization Q4H PRN Ivor Costa, MD   2.5 mg at 01/04/21 1419   Ampicillin-Sulbactam (UNASYN) 3 g in sodium chloride 0.9 % 100 mL IVPB  3 g Intravenous Q6H Beers, Shanon Brow, RPH 200 mL/hr at 01/04/21 1416 3 g at 01/04/21 1416   aspirin EC tablet 81 mg  81 mg Oral Daily Ivor Costa, MD   81 mg at 01/04/21 0825   cholecalciferol (VITAMIN D3) tablet 1,000 Units  1,000 Units Oral Daily Ivor Costa, MD   1,000 Units at 01/04/21 0826   dextromethorphan-guaiFENesin ([place] DM) 30-600 MG per  12 hr tablet 1 tablet  1 tablet Oral BID PRN Ivor Costa, MD       enoxaparin (LOVENOX) injection 40 mg  40 mg Subcutaneous QHS Ivor Costa, MD   40 mg at 01/03/21 2103   hydrALAZINE (APRESOLINE) injection 5 mg  5 mg Intravenous Q2H PRN Ivor Costa, MD       ipratropium-albuterol (DUONEB) 0.5-2.5 (3) MG/3ML nebulizer solution 3 mL  3 mL Nebulization Q4H Ivor Costa, MD   3 mL at 01/03/21 2301   levalbuterol (XOPENEX) nebulizer solution 0.63 mg  0.63 mg Nebulization Q6H PRN Ivor Costa, MD       multivitamin with minerals tablet 1 tablet  1 tablet Oral Daily Ivor Costa, MD   1 tablet at 01/04/21 4128   omega-3 acid ethyl esters (LOVAZA) capsule 1 g  1 g Oral Daily Ivor Costa, MD   1 g at 01/04/21 1130   ondansetron (ZOFRAN) injection 4 mg  4 mg Intravenous Q8H PRN Ivor Costa, MD       pantoprazole (PROTONIX) EC tablet 40 mg  40 mg Oral Daily Ivor Costa, MD   40 mg at 01/04/21 7867   rosuvastatin (CRESTOR) tablet 40 mg  40 mg Oral Gordan Payment, MD   40 mg at 01/03/21 2103   Current Outpatient Medications  Medication Sig Dispense Refill   albuterol (VENTOLIN HFA) 108 (90 Base) MCG/ACT inhaler Inhale 2 puffs into the lungs every 6 (six) hours as needed for wheezing or shortness of breath.     cholecalciferol (VITAMIN D3) 25 MCG (1000 UNIT) tablet Take 1,000 Units by mouth daily.     furosemide (LASIX) 20 MG tablet TAKE ONE TABLET BY MOUTH EVERY DAY FOR FLUID CONTROL, START 10/25     losartan (COZAAR) 50 MG tablet Take 25 mg by mouth daily. Pt taking half tabs at each dose for conservation     meloxicam (MOBIC) 15 MG tablet Take 15 mg by mouth daily.     Multiple Vitamins-Minerals (MULTIVITAMIN ADULTS PO) Take 1 tablet by mouth daily.     Omega-3 Fatty Acids (FISH OIL) 1000 MG CAPS TAKE 3 CAPSULES (SEA-OMEGA 30 FISH OIL) BY MOUTH TWO TIMES A DAY FOR CHOLESTEROL     omeprazole (PRILOSEC) 20 MG capsule Take 20 mg by mouth daily.     pantoprazole (PROTONIX) 40 MG tablet 1 tablet daily.     rosuvastatin  (CRESTOR) 40 MG tablet Take 40 mg by mouth daily.     docusate sodium (COLACE) 100 MG capsule Take 100 mg by mouth 2 (two) times daily. (Patient not taking: No sig  reported)     Fluticasone-Salmeterol (ADVAIR) 250-50 MCG/DOSE AEPB Inhale 1 puff into the lungs 2 (two) times daily. (Patient not taking: No sig reported)     ketotifen (ZADITOR) 0.025 % ophthalmic solution 1 drop 2 (two) times daily.     pseudoephedrine-acetaminophen (TYLENOL SINUS) 30-500 MG TABS tablet Take 1 tablet by mouth every 4 (four) hours as needed.       Abtx:  Anti-infectives (From admission, onward)    Start     Dose/Rate Route Frequency Ordered Stop   01/04/21 0900  cefTRIAXone (ROCEPHIN) 1 g in sodium chloride 0.9 % 100 mL IVPB  Status:  Discontinued        1 g 200 mL/hr over 30 Minutes Intravenous Every 24 hours 01/03/21 1047 01/03/21 1211   01/04/21 0900  Ampicillin-Sulbactam (UNASYN) 3 g in sodium chloride 0.9 % 100 mL IVPB        3 g 200 mL/hr over 30 Minutes Intravenous Every 6 hours 01/04/21 0806     01/04/21 0745  ampicillin (OMNIPEN) 2 g in sodium chloride 0.9 % 100 mL IVPB  Status:  Discontinued        2 g 300 mL/hr over 20 Minutes Intravenous Every 6 hours 01/04/21 0732 01/04/21 0806   01/03/21 1500  levofloxacin (LEVAQUIN) IVPB 750 mg  Status:  Discontinued        750 mg 100 mL/hr over 90 Minutes Intravenous Every 48 hours 01/03/21 1211 01/03/21 1256   01/03/21 1300  levofloxacin (LEVAQUIN) IVPB 750 mg  Status:  Discontinued        750 mg 100 mL/hr over 90 Minutes Intravenous Every 48 hours 01/03/21 1256 01/04/21 0732   01/03/21 1000  vancomycin (VANCOREADY) IVPB 2000 mg/400 mL        2,000 mg 200 mL/hr over 120 Minutes Intravenous  Once 01/03/21 0953 01/03/21 1212   01/03/21 0945  cefTRIAXone (ROCEPHIN) 1 g in sodium chloride 0.9 % 100 mL IVPB        1 g 200 mL/hr over 30 Minutes Intravenous  Once 01/03/21 0931 01/03/21 1023   01/03/21 0945  doxycycline (VIBRAMYCIN) 100 mg in sodium chloride 0.9 %  250 mL IVPB  Status:  Discontinued        100 mg 125 mL/hr over 120 Minutes Intravenous  Once 01/03/21 0931 01/03/21 0952       REVIEW OF SYSTEMS:  Const: negative fever, negative chills, negative weight loss Eyes: negative diplopia or visual changes, negative eye pain ENT: negative coryza, negative sore throat Resp: negative cough, hemoptysis, dyspnea Cards: negative for chest pain, palpitations, lower extremity edema GU: negative for frequency, dysuria and hematuria GI: Negative for abdominal pain, diarrhea, bleeding, constipation Skin: negative for rash and pruritus Heme: negative for easy bruising and gum/nose bleeding MS: negative for myalgias, arthralgias, back pain and muscle weakness Neurolo:negative for headaches, dizziness, vertigo, memory problems  Psych: negative for feelings of anxiety, depression  Endocrine: negative for thyroid, diabetes Allergy/Immunology- negative for any medication or food allergies ? Pertinent Positives include : Objective:  VITALS:  BP (!) 135/41   Pulse 83   Temp 98 F (36.7 C) (Oral)   Resp (!) 22   Ht 5\' 5"  (1.651 m)   Wt 78 kg   SpO2 97%   BMI 28.62 kg/m  PHYSICAL EXAM:  General: Alert, cooperative, no distress, appears stated age.  Head: Normocephalic, without obvious abnormality, atraumatic. Eyes: Conjunctivae clear, anicteric sclerae. Pupils are equal ENT Nares normal. No drainage or sinus tenderness. Lips,  mucosa, and tongue normal. No Thrush Neck: Supple, symmetrical, no adenopathy, thyroid: non tender no carotid bruit and no JVD. Back: No CVA tenderness. Lungs: b/l air entry- basal crepts. Heart: Regular rate and rhythm, no murmur, rub or gallop. Abdomen: Soft, non-tender,not distended. Bowel sounds normal. No masses Extremities: atraumatic, no cyanosis. No edema. No clubbing Skin: No rashes or lesions. Or bruising Lymph: Cervical, supraclavicular normal. Neurologic: Grossly non-focal Pertinent Labs Lab Results CBC     Component Value Date/Time   WBC 12.1 (H) 01/03/2021 0605   RBC 3.71 (L) 01/03/2021 0605   HGB 10.6 (L) 01/03/2021 0605   HGB 13.8 02/01/2013 0302   HCT 32.2 (L) 01/03/2021 0605   HCT 40.9 02/01/2013 0302   PLT 245 01/03/2021 0605   PLT 150 02/01/2013 0302   MCV 86.8 01/03/2021 0605   MCV 91 02/01/2013 0302   MCH 28.6 01/03/2021 0605   MCHC 32.9 01/03/2021 0605   RDW 13.6 01/03/2021 0605   RDW 13.2 02/01/2013 0302   LYMPHSABS 1.4 01/03/2021 0605   MONOABS 1.2 (H) 01/03/2021 0605   EOSABS 0.1 01/03/2021 0605   BASOSABS 0.0 01/03/2021 0605    CMP Latest Ref Rng & Units 01/04/2021 01/03/2021 01/03/2021  Glucose 70 - 99 mg/dL 111(H) 132(H) 119(H)  BUN 8 - 23 mg/dL 20 20 17   Creatinine 0.61 - 1.24 mg/dL 1.44(H) 1.64(H) 1.54(H)  Sodium 135 - 145 mmol/L 133(L) 131(L) 130(L)  Potassium 3.5 - 5.1 mmol/L 4.3 3.8 4.3  Chloride 98 - 111 mmol/L 104 100 99  CO2 22 - 32 mmol/L 21(L) 22 24  Calcium 8.9 - 10.3 mg/dL 8.0(L) 8.0(L) 8.2(L)  Total Protein 6.5 - 8.1 g/dL - 6.7 -  Total Bilirubin 0.3 - 1.2 mg/dL - 1.3(H) -  Alkaline Phos 38 - 126 U/L - 87 -  AST 15 - 41 U/L - 66(H) -  ALT 0 - 44 U/L - 85(H) -      Microbiology: Recent Results (from the past 240 hour(s))  Resp Panel by RT-PCR (Flu A&B, Covid) Nasopharyngeal Swab     Status: None   Collection Time: 01/03/21  6:06 AM   Specimen: Nasopharyngeal Swab; Nasopharyngeal(NP) swabs in vial transport medium  Result Value Ref Range Status   SARS Coronavirus 2 by RT PCR NEGATIVE NEGATIVE Final    Comment: (NOTE) SARS-CoV-2 target nucleic acids are NOT DETECTED.  The SARS-CoV-2 RNA is generally detectable in upper respiratory specimens during the acute phase of infection. The lowest concentration of SARS-CoV-2 viral copies this assay can detect is 138 copies/mL. A negative result does not preclude SARS-Cov-2 infection and should not be used as the sole basis for treatment or other patient management decisions. A negative result may  occur with  improper specimen collection/handling, submission of specimen other than nasopharyngeal swab, presence of viral mutation(s) within the areas targeted by this assay, and inadequate number of viral copies(<138 copies/mL). A negative result must be combined with clinical observations, patient history, and epidemiological information. The expected result is Negative.  Fact Sheet for Patients:  EntrepreneurPulse.com.au  Fact Sheet for Healthcare Providers:  IncredibleEmployment.be  This test is no t yet approved or cleared by the [place] FDA and  has been authorized for detection and/or diagnosis of SARS-CoV-2 by FDA under an Emergency Use Authorization (EUA). This EUA will remain  in effect (meaning this test can be used) for the duration of the COVID-19 declaration under Section 564(b)(1) of the Act, 21 U.S.C.section 360bbb-3(b)(1), unless the authorization is terminated  or  revoked sooner.       Influenza A by PCR NEGATIVE NEGATIVE Final   Influenza B by PCR NEGATIVE NEGATIVE Final    Comment: (NOTE) The Xpert Xpress SARS-CoV-2/FLU/RSV plus assay is intended as an aid in the diagnosis of influenza from Nasopharyngeal swab specimens and should not be used as a sole basis for treatment. Nasal washings and aspirates are unacceptable for Xpert Xpress SARS-CoV-2/FLU/RSV testing.  Fact Sheet for Patients: EntrepreneurPulse.com.au  Fact Sheet for Healthcare Providers: IncredibleEmployment.be  This test is not yet approved or cleared by the [place] FDA and has been authorized for detection and/or diagnosis of SARS-CoV-2 by FDA under an Emergency Use Authorization (EUA). This EUA will remain in effect (meaning this test can be used) for the duration of the COVID-19 declaration under Section 564(b)(1) of the Act, 21 U.S.C. section 360bbb-3(b)(1), unless the authorization is terminated  or revoked.  Performed at [facility], [address] [place], [place] [address]   Blood culture (routine x 2)     Status: None (Preliminary result)   Collection Time: 01/03/21  9:17 AM   Specimen: BLOOD  Result Value Ref Range Status   Specimen Description BLOOD LEFT ANTECUBITAL  Final   Special Requests   Final    BOTTLES DRAWN AEROBIC AND ANAEROBIC Blood Culture adequate volume   Culture  Setup Time   Final    GRAM POSITIVE COCCI IN BOTH AEROBIC AND ANAEROBIC BOTTLES CRITICAL RESULT CALLED TO, READ BACK BY AND VERIFIED WITH: NATHAN BELUE AT 6440 01/04/21.PMF Performed at [facility], [address] [place], [place] [address]    Culture [facility] POSITIVE COCCI  Final   Report Status PENDING  Incomplete  Blood culture (routine x 2)     Status: None (Preliminary result)   Collection Time: 01/03/21  9:17 AM   Specimen: BLOOD LEFT HAND  Result Value Ref Range Status   Specimen Description BLOOD LEFT HAND  Final   Special Requests   Final    BOTTLES DRAWN AEROBIC AND ANAEROBIC Blood Culture adequate volume   Culture  Setup Time   Final    Organism ID to follow GRAM POSITIVE COCCI IN BOTH AEROBIC AND ANAEROBIC BOTTLES CRITICAL RESULT CALLED TO, READ BACK BY AND VERIFIED WITH: NATHAN BELUE AT 5956 01/04/21.PMF Performed at [facility], [address] [place], [place] [address]    Culture [facility] POSITIVE COCCI  Final   Report Status PENDING  Incomplete  Blood Culture ID Panel (Reflexed)     Status: Abnormal   Collection Time: 01/03/21  9:17 AM  Result Value Ref Range Status   Enterococcus faecalis DETECTED (A) NOT DETECTED Final    Comment: CRITICAL RESULT CALLED TO, READ BACK BY AND VERIFIED WITH: NATHAN BELUE AT 4332 01/04/21.PMF    Enterococcus Faecium NOT DETECTED NOT DETECTED Final   Listeria monocytogenes NOT DETECTED NOT DETECTED Final   Staphylococcus species NOT DETECTED NOT DETECTED Final   Staphylococcus aureus (BCID) NOT DETECTED NOT  DETECTED Final   Staphylococcus epidermidis NOT DETECTED NOT DETECTED Final   Staphylococcus lugdunensis NOT DETECTED NOT DETECTED Final   Streptococcus species NOT DETECTED NOT DETECTED Final   Streptococcus agalactiae NOT DETECTED NOT DETECTED Final   Streptococcus pneumoniae NOT DETECTED NOT DETECTED Final   Streptococcus pyogenes NOT DETECTED NOT DETECTED Final   A.calcoaceticus-baumannii NOT DETECTED NOT DETECTED Final   Bacteroides fragilis NOT DETECTED NOT DETECTED Final   Enterobacterales NOT DETECTED NOT DETECTED Final   Enterobacter cloacae complex NOT DETECTED  NOT DETECTED Final   Escherichia coli NOT DETECTED NOT DETECTED Final   Klebsiella aerogenes NOT DETECTED NOT DETECTED Final   Klebsiella oxytoca NOT DETECTED NOT DETECTED Final   Klebsiella pneumoniae NOT DETECTED NOT DETECTED Final   Proteus species NOT DETECTED NOT DETECTED Final   Salmonella species NOT DETECTED NOT DETECTED Final   Serratia marcescens NOT DETECTED NOT DETECTED Final   Haemophilus influenzae NOT DETECTED NOT DETECTED Final   Neisseria meningitidis NOT DETECTED NOT DETECTED Final   Pseudomonas aeruginosa NOT DETECTED NOT DETECTED Final   Stenotrophomonas maltophilia NOT DETECTED NOT DETECTED Final   Candida albicans NOT DETECTED NOT DETECTED Final   Candida auris NOT DETECTED NOT DETECTED Final   Candida glabrata NOT DETECTED NOT DETECTED Final   Candida krusei NOT DETECTED NOT DETECTED Final   Candida parapsilosis NOT DETECTED NOT DETECTED Final   Candida tropicalis NOT DETECTED NOT DETECTED Final   Cryptococcus neoformans/gattii NOT DETECTED NOT DETECTED Final   Vancomycin resistance NOT DETECTED NOT DETECTED Final    Comment: Performed at [facility], [address] [place], [place] [address]    IMAGING RESULTS:  I have personally reviewed the films ? Impression/Recommendation ?pt presenting with sob and cough for the past month- intially started with fever and was diagnosed as  a viral resp infection a month ago at [place] Now has SOB on exertion, orthopnea PND  Enterococcus bacteremia- with symptoms of new onset CHF need to r/o endocarditis Pt had 2 d echo- valves no vegetation He will need TEE Source of enterococcus unclear ( it is a gi pathogen) Will get urine culture ( UA no WBC but patient has had some decreased output and some difficulty in apssing urine0Will need post void bladder scan Also has altered bowel movts, initially constipation and now has diarrhea whenever he has a bowel movt Will check for cdiff ( less likely_ May need CT abdomen to look for GI pathology  Pt is currently on unasyn- will change that to ampicillin Will add ceftriaxone to treat empirically like endocarditis until TEE  Has had compression fracture thoracic spine and pain back and neck- woul recommend MRI to look for any discitis    CHF- doubt he has pneumonia   AKI-   Anemia- ? hemooccult ? ? ___________________________________________________ Discussed with patient, and his wife Note:  This document was prepared using Dragon voice recognition software and may include unintentional dictation errors.

## 2021-01-05 ENCOUNTER — Inpatient Hospital Stay: Payer: No Typology Code available for payment source

## 2021-01-05 DIAGNOSIS — I5021 Acute systolic (congestive) heart failure: Secondary | ICD-10-CM

## 2021-01-05 DIAGNOSIS — J9601 Acute respiratory failure with hypoxia: Secondary | ICD-10-CM | POA: Diagnosis not present

## 2021-01-05 DIAGNOSIS — R7881 Bacteremia: Secondary | ICD-10-CM | POA: Diagnosis not present

## 2021-01-05 DIAGNOSIS — B952 Enterococcus as the cause of diseases classified elsewhere: Secondary | ICD-10-CM | POA: Diagnosis not present

## 2021-01-05 LAB — CBC
HCT: 29.4 % — ABNORMAL LOW (ref 39.0–52.0)
Hemoglobin: 9.7 g/dL — ABNORMAL LOW (ref 13.0–17.0)
MCH: 29.1 pg (ref 26.0–34.0)
MCHC: 33 g/dL (ref 30.0–36.0)
MCV: 88.3 fL (ref 80.0–100.0)
Platelets: 204 10*3/uL (ref 150–400)
RBC: 3.33 MIL/uL — ABNORMAL LOW (ref 4.22–5.81)
RDW: 13.9 % (ref 11.5–15.5)
WBC: 8.1 10*3/uL (ref 4.0–10.5)
nRBC: 0 % (ref 0.0–0.2)

## 2021-01-05 LAB — RENAL FUNCTION PANEL
Albumin: 2.3 g/dL — ABNORMAL LOW (ref 3.5–5.0)
Anion gap: 8 (ref 5–15)
BUN: 17 mg/dL (ref 8–23)
CO2: 22 mmol/L (ref 22–32)
Calcium: 8.1 mg/dL — ABNORMAL LOW (ref 8.9–10.3)
Chloride: 103 mmol/L (ref 98–111)
Creatinine, Ser: 1.48 mg/dL — ABNORMAL HIGH (ref 0.61–1.24)
GFR, Estimated: 47 mL/min — ABNORMAL LOW (ref >60)
Glucose, Bld: 194 mg/dL — ABNORMAL HIGH (ref 70–99)
Phosphorus: 3.7 mg/dL (ref 2.5–4.6)
Potassium: 4.1 mmol/L (ref 3.5–5.1)
Sodium: 133 mmol/L — ABNORMAL LOW (ref 135–145)

## 2021-01-05 LAB — LEGIONELLA PNEUMOPHILA SEROGP 1 UR AG: L. pneumophila Serogp 1 Ur Ag: NEGATIVE

## 2021-01-05 LAB — HEMOGLOBIN A1C
Hgb A1c MFr Bld: 6.1 % — ABNORMAL HIGH (ref 4.8–5.6)
Mean Plasma Glucose: 128.37 mg/dL

## 2021-01-05 LAB — TROPONIN I (HIGH SENSITIVITY): Troponin I (High Sensitivity): 62 ng/L — ABNORMAL HIGH (ref <18)

## 2021-01-05 LAB — VITAMIN B12: Vitamin B-12: 382 pg/mL (ref 180–914)

## 2021-01-05 IMAGING — MR MR THORACIC SPINE W/O CM
6 of 7 series · 33 of 48 positions shown · non-contrast
Comparison: None.

CLINICAL DATA: Enterococcus bacteremia, neck pain, infection
suspected

EXAM:
MRI CERVICAL AND THORACIC SPINE WITHOUT CONTRAST
TECHNIQUE: Multiplanar and multiecho pulse sequences of the cervical and
thoracic spine were obtained without intravenous contrast.

[Series 18: T1 · sagittal · 6.0mm · 1.88mm/px · 4 of 9 slices shown (1 of 2)]
[im 1/9]
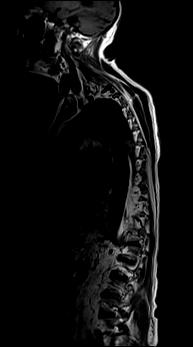
[im 3/9]
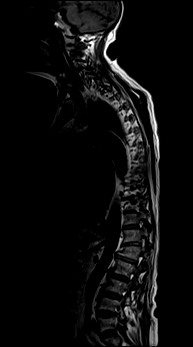
[im 6/9]
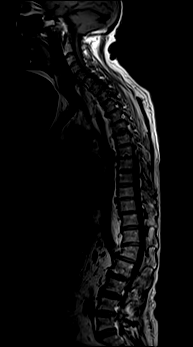
[im 9/9]
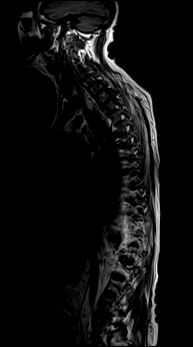

[Series 19: T2 · sagittal · 3.0mm · 1.33mm/px · 5 of 17 slices shown (1 of 2)]
[im 1/17]
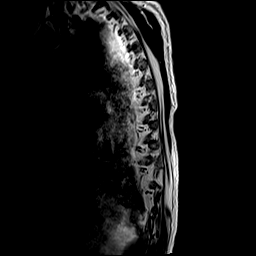
[im 5/17]
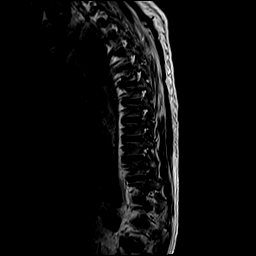
[im 9/17]
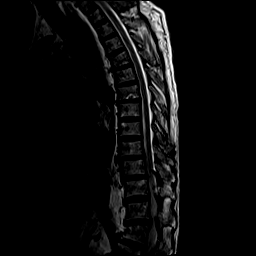
[im 13/17]
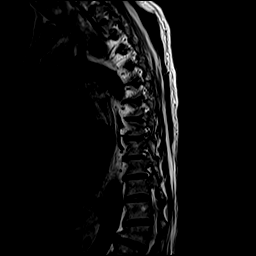
[im 17/17]
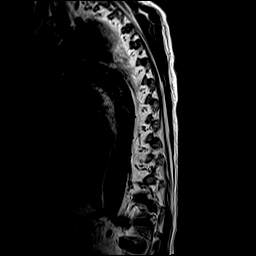

[Series 20: T1 · sagittal · 3.0mm · 1.33mm/px · 5 of 17 slices shown (2 of 2)]
[im 1/17]
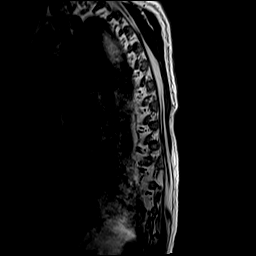
[im 5/17]
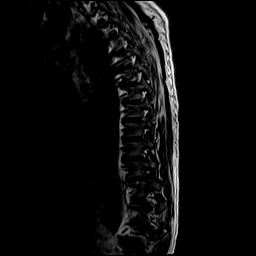
[im 9/17]
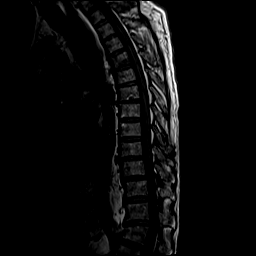
[im 13/17]
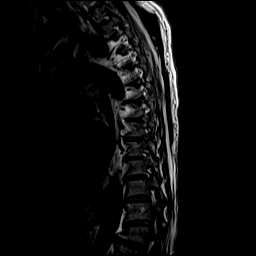
[im 17/17]
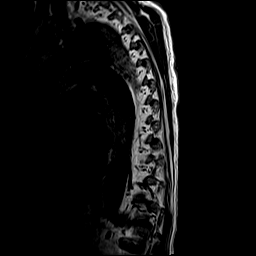

[Series 21: STIR · sagittal · 3.0mm · 0.66mm/px · 6 of 19 slices shown]
[im 1/19]
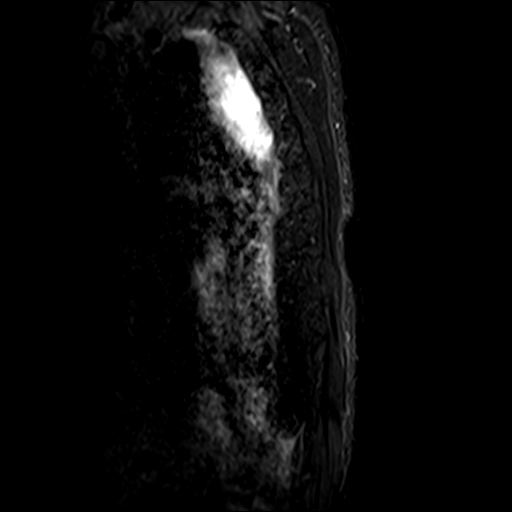
[im 4/19]
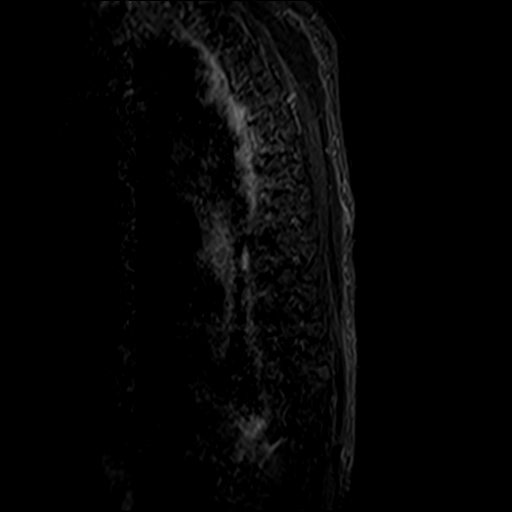
[im 8/19]
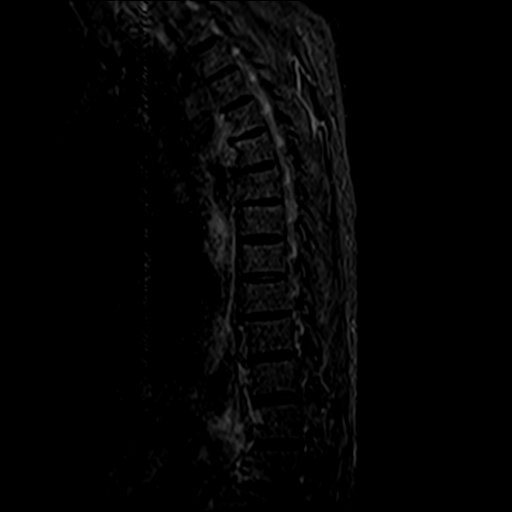
[im 11/19]
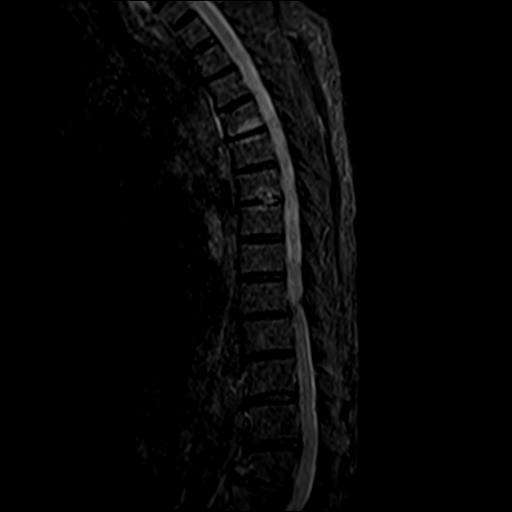
[im 15/19]
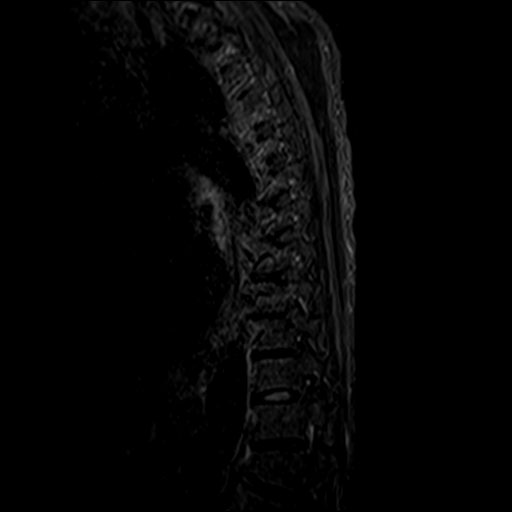
[im 19/19]
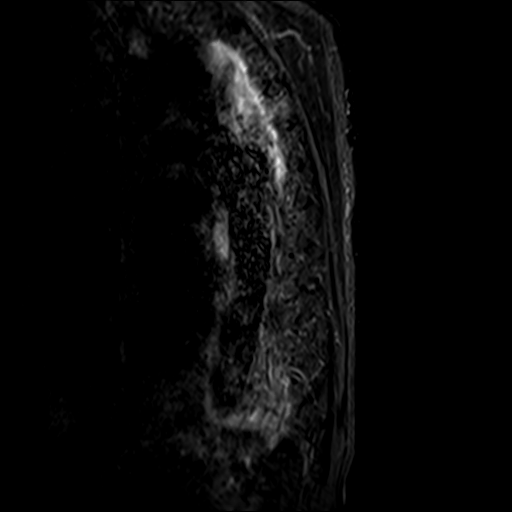

[Series 22: T2 · axial · 4.0mm · 0.59mm/px · z∈[-349,-112]mm · 9 of 39 slices shown (2 of 2)]
[im 1/39]
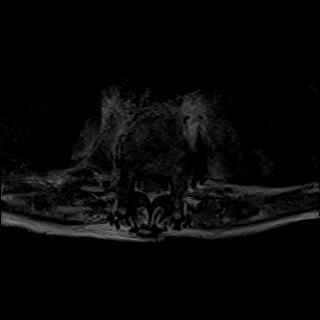
[im 7/39]
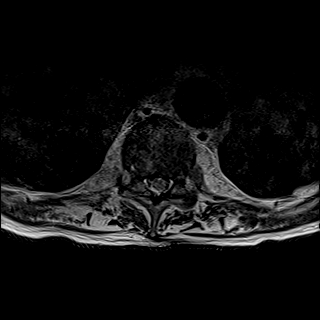
[im 11/39]
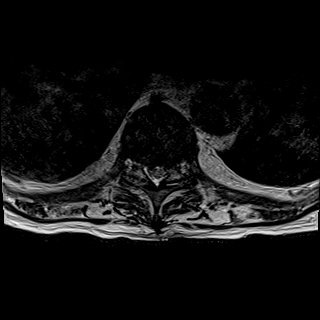
[im 18/39]
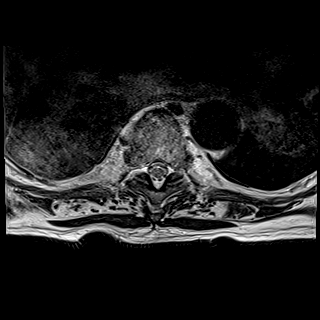
[im 21/39]
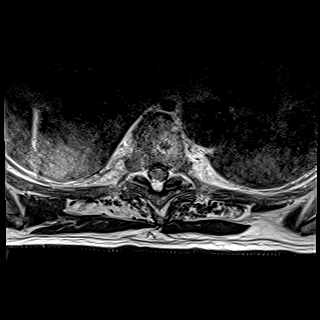
[im 28/39]
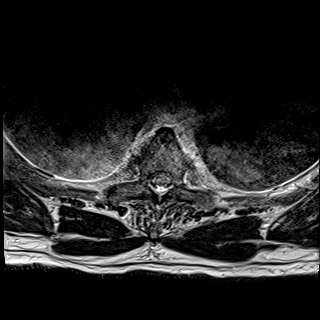
[im 32/39]
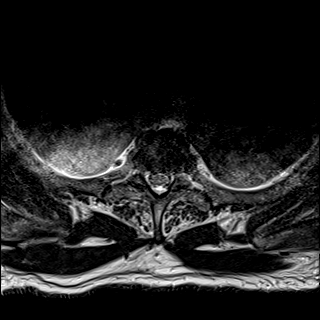
[im 35/39]
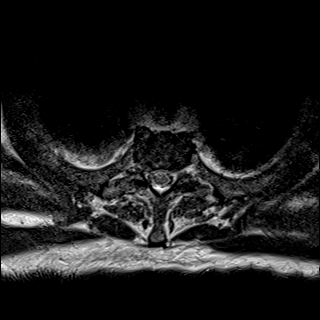
[im 39/39]
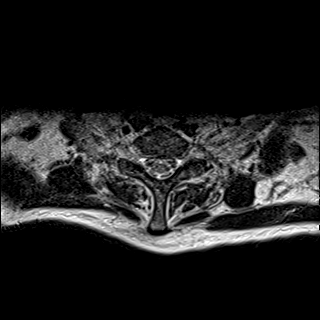

[Series 24: T1 post-contrast · axial · non-contrast · 4.0mm · 0.37mm/px · z∈[-359,-124]mm · 4 of 13 slices shown]
[im 1/13]
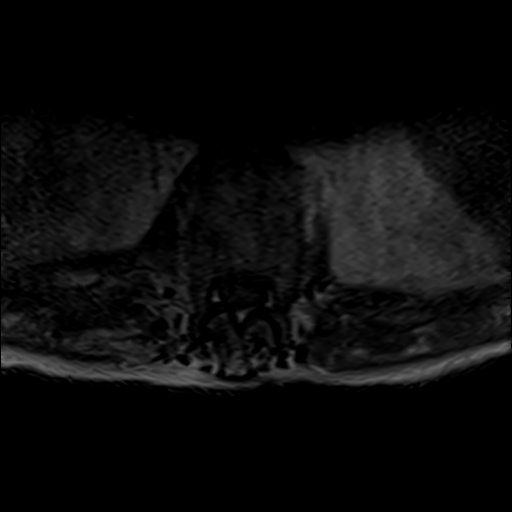
[im 5/13]
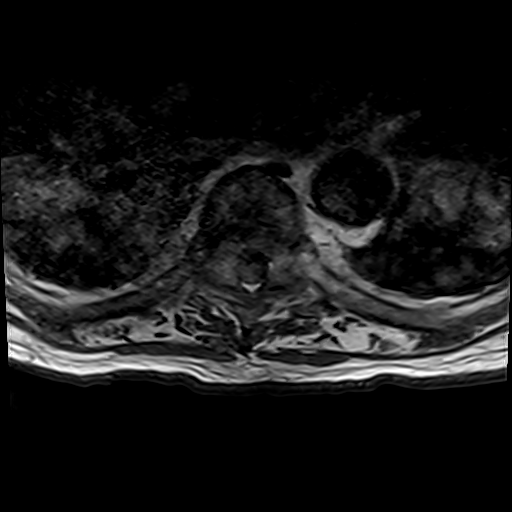
[im 9/13]
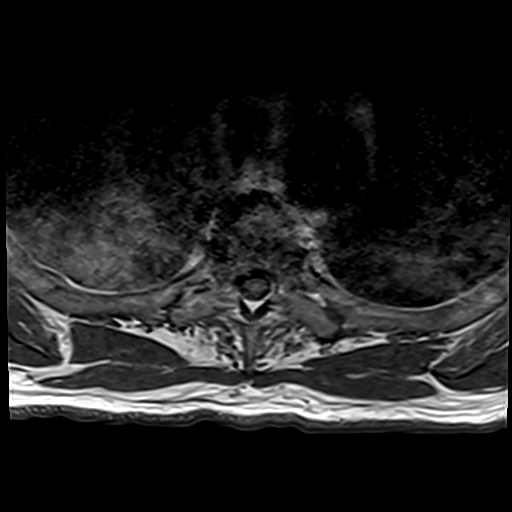
[im 13/13]
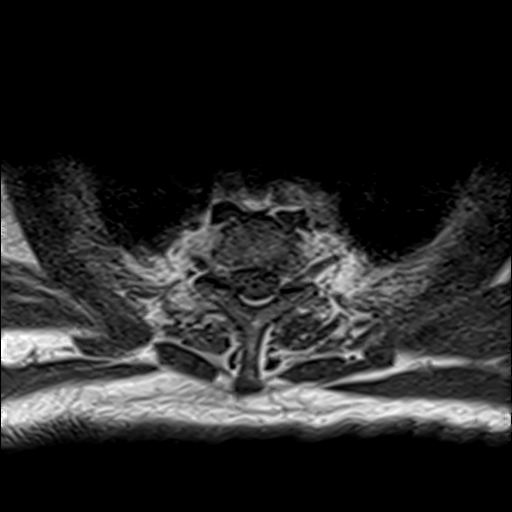

[33 of 48 positions shown; findings below may reference images not displayed]

FINDINGS: Evaluation is somewhat limited by motion artifact.

MRI CERVICAL SPINE FINDINGS

Alignment: Physiologic.

Vertebrae: No acute fracture or suspicious lesion. Evaluation for
osteomyelitis is somewhat limited in the absence of intravenous
contrast; however no increased T2 signal is seen in the endplates or
intervertebral discs. Congenitally short pedicles, which narrow the
AP diameter of the spinal canal.

Cord: Normal signal and morphology.

Posterior Fossa, vertebral arteries, paraspinal tissues: Negative.

Disc levels:

C2-C3: No significant disc bulge. Facet arthropathy. No spinal canal
stenosis. Mild bilateral neural foraminal narrowing.

C3-C4: Small left foraminal disc protrusion. Facet and uncovertebral
hypertrophy. Severe left and moderate right neural foraminal
narrowing. No spinal canal stenosis

C4-C5: Moderate disc bulge. Facet arthropathy. Moderate spinal canal
stenosis. Moderate bilateral neural foraminal narrowing.

C5-C6: No significant disc bulge. Facet and uncovertebral
hypertrophy. No spinal canal stenosis. Moderate bilateral neural
foraminal narrowing.

C6-C7: No significant disc bulge. Facet arthropathy. No spinal canal
stenosis. Mild bilateral neural foraminal narrowing.

C7-T1: No significant disc bulge. No spinal canal stenosis or
neuroforaminal narrowing.

MRI THORACIC SPINE FINDINGS

Alignment:  Physiologic.

Vertebrae: No acute fracture. Endplate degenerative changes T4-T5
without increased signal within the disc, favored to be
degenerative. There is additional increased T2 signal at T6-T7, with
increased T1 signal at the superior endplate, likely combination of
Modic type 1 and type 2 degenerative changes, with increased T2
signal within the intervertebral disc (series 21, image 11), favored
to be reactive.

Cord:  Normal signal and morphology.

Paraspinal and other soft tissues: No paravertebral collection.

Disc levels:

Facet arthropathy in the lower thoracic spine, causes mild osseous
canal narrowing without significant spinal canal stenosis. Moderate
left neural foraminal narrowing at T9-T10 and moderate right neural
foraminal narrowing at T9-T10 and T10-T11.

Partially visualized lumbar spine demonstrates a chronic appearing
compression deformity of L1.
IMPRESSION: 1. No definite evidence of osteomyelitis discitis, although
evaluation is somewhat limited by the absence of intravenous
contrast.
2. C4-C5 moderate spinal canal stenosis and moderate bilateral
neural foraminal narrowing.
3. C3-C4 severe left and moderate right neural foraminal narrowing.
4. C5-C6 moderate bilateral neural foraminal narrowing.

## 2021-01-05 IMAGING — MR MR CERVICAL SPINE W/O CM
5 series · 36 of 48 positions shown · non-contrast
Comparison: None.

CLINICAL DATA: Enterococcus bacteremia, neck pain, infection
suspected

EXAM:
MRI CERVICAL AND THORACIC SPINE WITHOUT CONTRAST
TECHNIQUE: Multiplanar and multiecho pulse sequences of the cervical and
thoracic spine were obtained without intravenous contrast.

[Series 16: T2 · sagittal · 3.0mm · 0.62mm/px · 6 of 15 slices shown (1 of 2)]
[im 1/15]
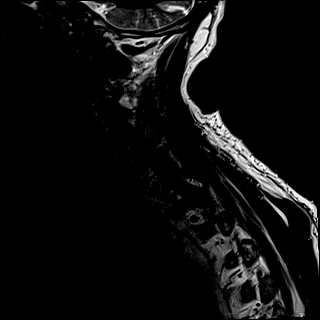
[im 3/15]
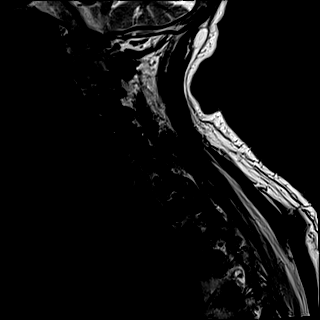
[im 6/15]
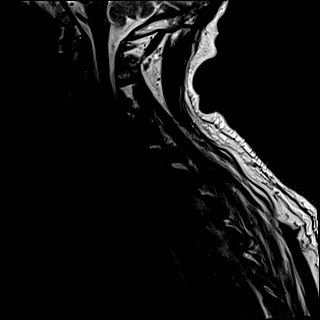
[im 9/15]
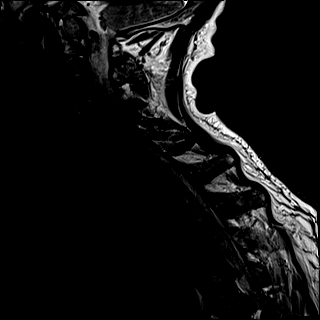
[im 12/15]
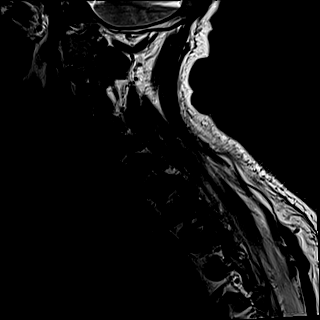
[im 15/15]
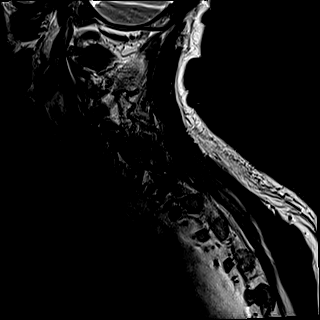

[Series 17: FLAIR · sagittal · 3.0mm · 0.78mm/px · 7 of 15 slices shown]
[im 1/15]
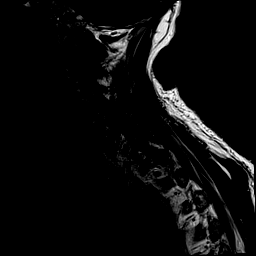
[im 3/15]
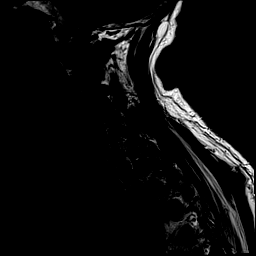
[im 5/15]
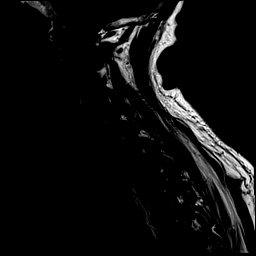
[im 8/15]
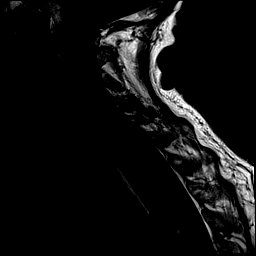
[im 10/15]
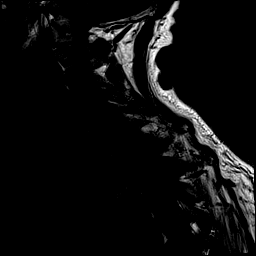
[im 12/15]
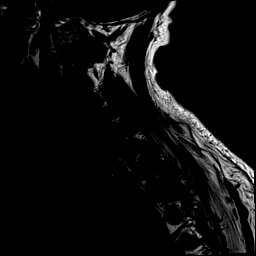
[im 15/15]
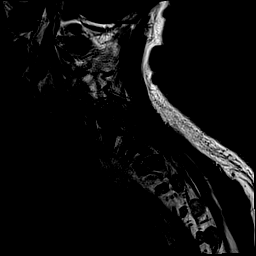

[Series 18: STIR · sagittal · 3.0mm · 0.62mm/px · 7 of 15 slices shown]
[im 1/15]
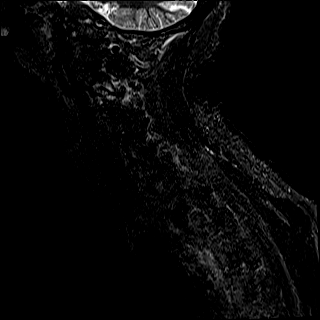
[im 3/15]
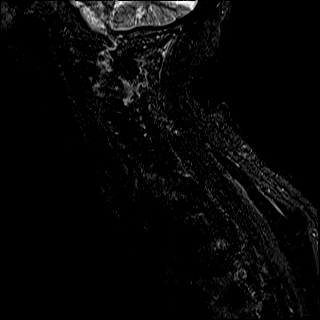
[im 5/15]
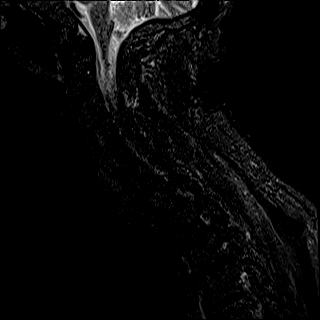
[im 8/15]
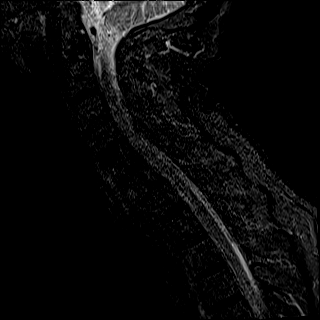
[im 10/15]
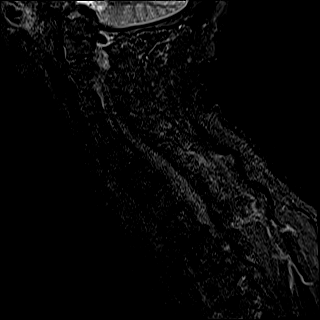
[im 12/15]
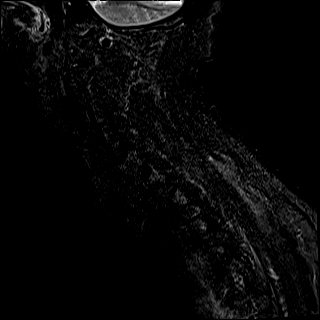
[im 15/15]
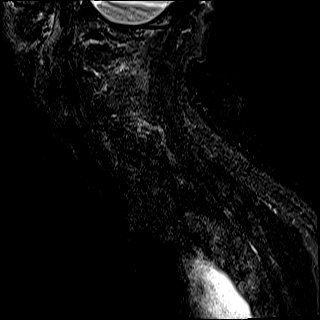

[Series 19: T2 · axial · 3.0mm · 0.70mm/px · z∈[-103,-7]mm · 8 of 31 slices shown (2 of 2)]
[im 1/31]
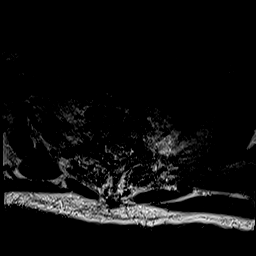
[im 5/31]
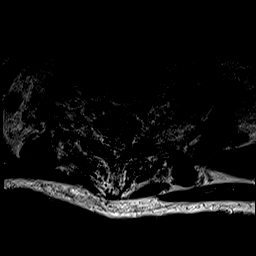
[im 10/31]
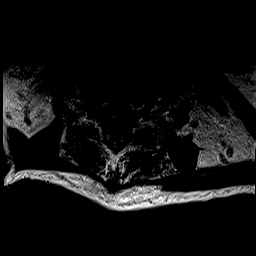
[im 14/31]
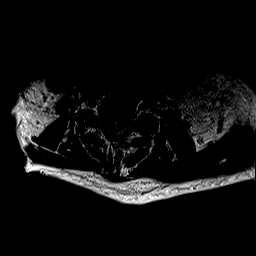
[im 17/31]
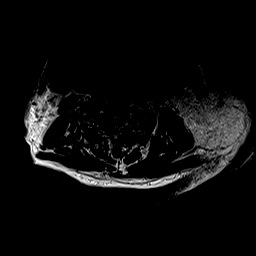
[im 21/31]
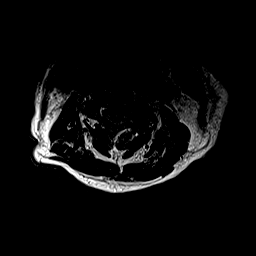
[im 26/31]
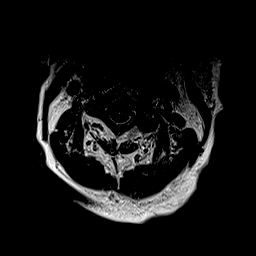
[im 31/31]
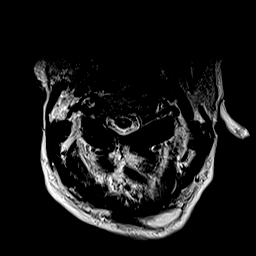

[Series 20: ax mpgr · axial · 3.0mm · 0.35mm/px · z∈[-103,-7]mm · 8 of 31 slices shown]
[im 1/31]
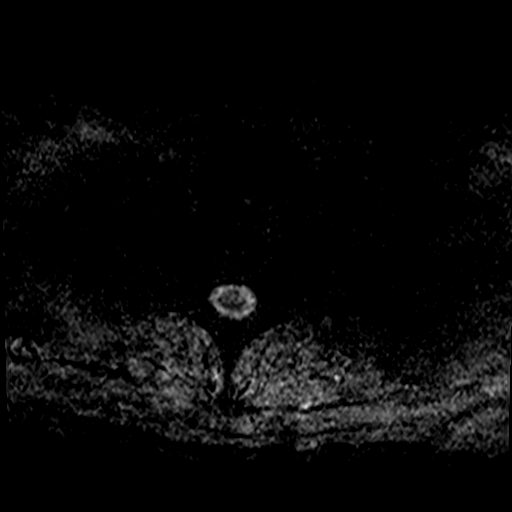
[im 5/31]
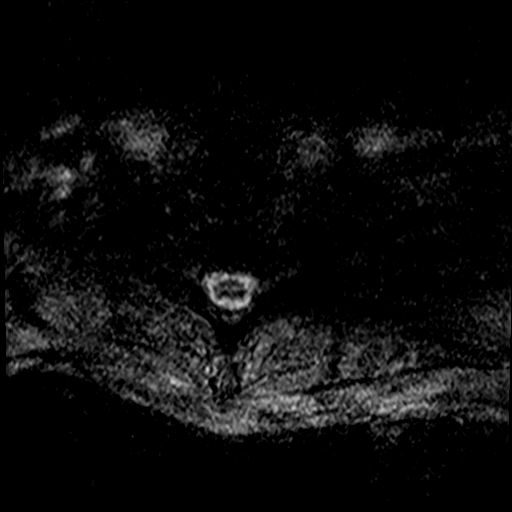
[im 10/31]
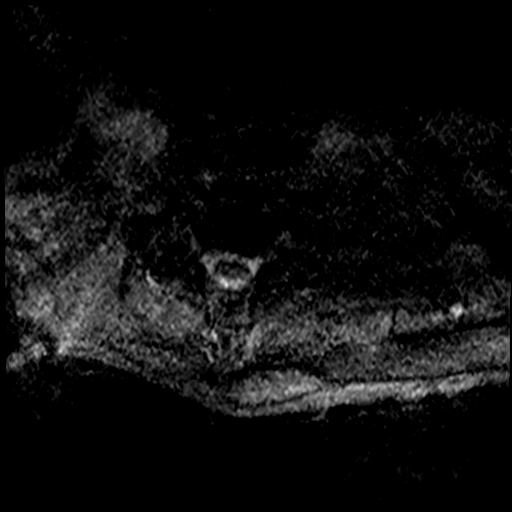
[im 14/31]
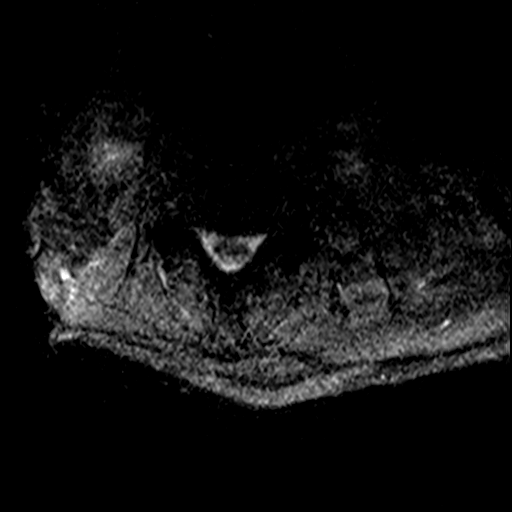
[im 17/31]
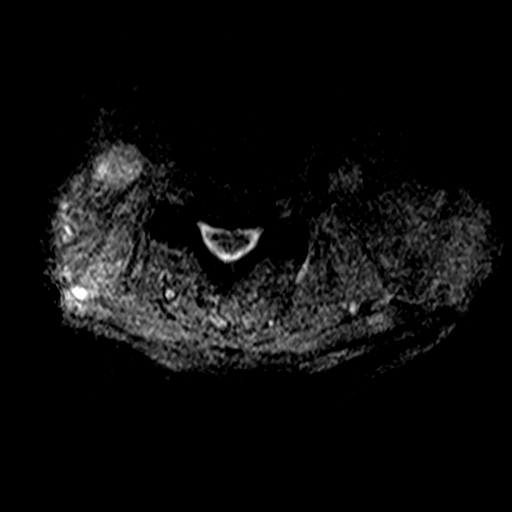
[im 21/31]
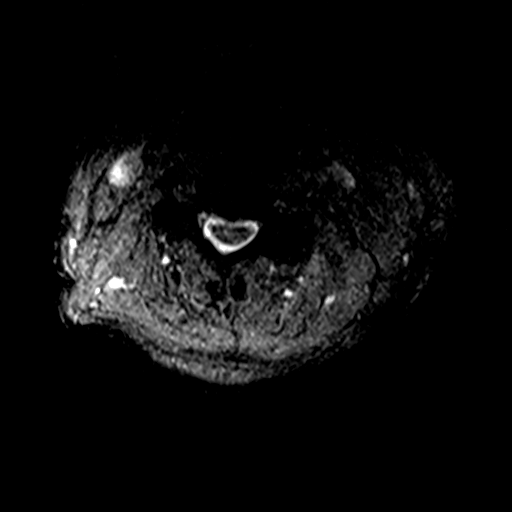
[im 26/31]
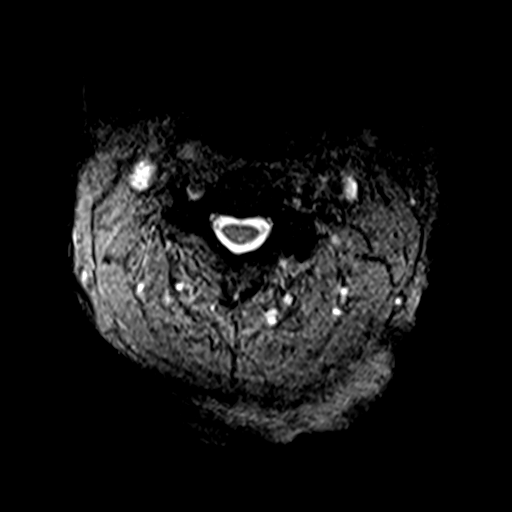
[im 31/31]
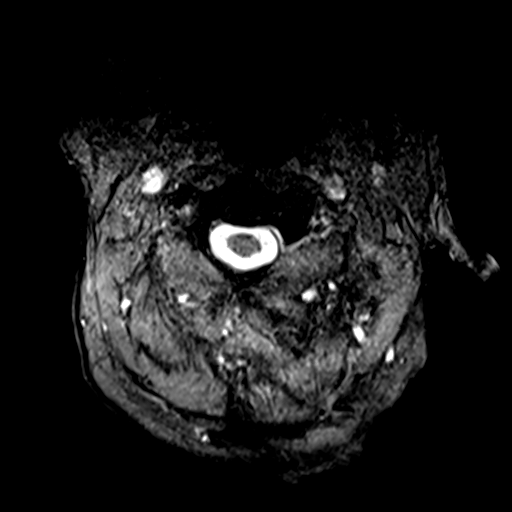

[36 of 48 positions shown; findings below may reference images not displayed]

FINDINGS: Evaluation is somewhat limited by motion artifact.

MRI CERVICAL SPINE FINDINGS

Alignment: Physiologic.

Vertebrae: No acute fracture or suspicious lesion. Evaluation for
osteomyelitis is somewhat limited in the absence of intravenous
contrast; however no increased T2 signal is seen in the endplates or
intervertebral discs. Congenitally short pedicles, which narrow the
AP diameter of the spinal canal.

Cord: Normal signal and morphology.

Posterior Fossa, vertebral arteries, paraspinal tissues: Negative.

Disc levels:

C2-C3: No significant disc bulge. Facet arthropathy. No spinal canal
stenosis. Mild bilateral neural foraminal narrowing.

C3-C4: Small left foraminal disc protrusion. Facet and uncovertebral
hypertrophy. Severe left and moderate right neural foraminal
narrowing. No spinal canal stenosis

C4-C5: Moderate disc bulge. Facet arthropathy. Moderate spinal canal
stenosis. Moderate bilateral neural foraminal narrowing.

C5-C6: No significant disc bulge. Facet and uncovertebral
hypertrophy. No spinal canal stenosis. Moderate bilateral neural
foraminal narrowing.

C6-C7: No significant disc bulge. Facet arthropathy. No spinal canal
stenosis. Mild bilateral neural foraminal narrowing.

C7-T1: No significant disc bulge. No spinal canal stenosis or
neuroforaminal narrowing.

MRI THORACIC SPINE FINDINGS

Alignment:  Physiologic.

Vertebrae: No acute fracture. Endplate degenerative changes T4-T5
without increased signal within the disc, favored to be
degenerative. There is additional increased T2 signal at T6-T7, with
increased T1 signal at the superior endplate, likely combination of
Modic type 1 and type 2 degenerative changes, with increased T2
signal within the intervertebral disc (series 21, image 11), favored
to be reactive.

Cord:  Normal signal and morphology.

Paraspinal and other soft tissues: No paravertebral collection.

Disc levels:

Facet arthropathy in the lower thoracic spine, causes mild osseous
canal narrowing without significant spinal canal stenosis. Moderate
left neural foraminal narrowing at T9-T10 and moderate right neural
foraminal narrowing at T9-T10 and T10-T11.

Partially visualized lumbar spine demonstrates a chronic appearing
compression deformity of L1.
IMPRESSION: 1. No definite evidence of osteomyelitis discitis, although
evaluation is somewhat limited by the absence of intravenous
contrast.
2. C4-C5 moderate spinal canal stenosis and moderate bilateral
neural foraminal narrowing.
3. C3-C4 severe left and moderate right neural foraminal narrowing.
4. C5-C6 moderate bilateral neural foraminal narrowing.

## 2021-01-05 MED ORDER — LORAZEPAM 1 MG PO TABS
1.0000 mg | ORAL_TABLET | Freq: Once | ORAL | Status: AC
  Administered 2021-01-05: 1 mg via ORAL
  Filled 2021-01-05: qty 1

## 2021-01-05 MED ORDER — SODIUM CHLORIDE 0.9 % IV SOLN
2.0000 g | Freq: Four times a day (QID) | INTRAVENOUS | Status: DC
  Administered 2021-01-05 – 2021-01-08 (×11): 2 g via INTRAVENOUS
  Filled 2021-01-05 (×2): qty 2000
  Filled 2021-01-05 (×3): qty 2
  Filled 2021-01-05 (×3): qty 2000
  Filled 2021-01-05: qty 2
  Filled 2021-01-05 (×2): qty 2000
  Filled 2021-01-05: qty 2
  Filled 2021-01-05 (×2): qty 2000

## 2021-01-05 MED ORDER — LORAZEPAM 1 MG PO TABS
0.5000 mg | ORAL_TABLET | ORAL | Status: DC | PRN
  Administered 2021-01-06 – 2021-01-08 (×9): 0.5 mg via ORAL
  Filled 2021-01-05 (×9): qty 1

## 2021-01-05 MED ORDER — IPRATROPIUM-ALBUTEROL 0.5-2.5 (3) MG/3ML IN SOLN
3.0000 mL | Freq: Four times a day (QID) | RESPIRATORY_TRACT | Status: DC
  Administered 2021-01-05 – 2021-01-07 (×8): 3 mL via RESPIRATORY_TRACT
  Filled 2021-01-05 (×11): qty 3

## 2021-01-05 MED ORDER — FERROUS SULFATE 325 (65 FE) MG PO TABS
325.0000 mg | ORAL_TABLET | Freq: Every day | ORAL | Status: DC
  Administered 2021-01-05 – 2021-01-24 (×10): 325 mg via ORAL
  Filled 2021-01-05 (×10): qty 1

## 2021-01-05 MED ORDER — SODIUM CHLORIDE 0.9 % IV SOLN: INTRAVENOUS | Status: DC

## 2021-01-05 NOTE — Progress Notes (Signed)
Attempted to collect stool specimen x2, but non-compliant with collection attempts. Pt unable to describe stool properties.

## 2021-01-05 NOTE — Progress Notes (Signed)
TOC consulted for heart failure screen, notified heart failure RN Jimsey of consult.   [place], [place]

## 2021-01-05 NOTE — Consult Note (Addendum)
Cardiology Consultation:   Patient ID: Phillip Chavez MRN: [id number]; DOB: 07-Jan-1941  Admit date: 01/03/2021 Date of Consult: 01/05/2021  PCP:  [facility] [place] [facility] Cardiologist:  None     Patient Profile:   Phillip Chavez is a 80 y.o. male with a hx of diet-controlled diabetes, essential hypertension and COPD who is being seen 01/05/2021 for the evaluation of Enterococcus bacteremia and need for TEE at the request of Dr. Reesa Chew.  History of Present Illness:   Phillip Chavez is an 80 year old male who normally follows at the [place].  He has known history of diet-controlled diabetes, essential hypertension and COPD.  The patient has not been feeling well for about 1 month and went to the [facility] emergency room for intermittent fever and fatigue thought to be due to viral illness.  His symptoms initially improved but then he developed shortness of breath as well as some back and neck pain.  He developed orthopnea.  He was seen in clinic at the [place] and was told there was some congestive heart failure and was started on small dose furosemide.  In addition, he had recent constipation.  The patient came to the ED for evaluation and was found to have Enterococcus bacteremia. We are consulted to perform a transesophageal echocardiogram.  The patient had never had a TEE before.   Past Medical History:  Diagnosis Date   Chronic CHF (congestive heart failure) (HCC)    COPD (chronic obstructive pulmonary disease) (HCC)    GERD (gastroesophageal reflux disease)    HLD (hyperlipidemia)    HTN (hypertension)     Past Surgical History:  Procedure Laterality Date   dental procedure       Home Medications:  Prior to Admission medications   Medication Sig Start Date End Date Taking? Authorizing Provider  albuterol (VENTOLIN HFA) 108 (90 Base) MCG/ACT inhaler Inhale 2 puffs into the lungs every 6 (six) hours as needed for wheezing or shortness of breath.   Yes [provider]  cholecalciferol (VITAMIN D3) 25 MCG (1000 UNIT) tablet Take 1,000 Units by mouth daily. 03/08/13  Yes [provider]  furosemide (LASIX) 20 MG tablet TAKE ONE TABLET BY MOUTH EVERY DAY FOR FLUID CONTROL, START 10/25 01/01/21  Yes [provider]  losartan (COZAAR) 50 MG tablet Take 25 mg by mouth daily. Pt taking half tabs at each dose for conservation   Yes [provider]  meloxicam (MOBIC) 15 MG tablet Take 15 mg by mouth daily.   Yes [provider]  Multiple Vitamins-Minerals (MULTIVITAMIN ADULTS PO) Take 1 tablet by mouth daily. 04/23/08  Yes [provider]  Omega-3 Fatty Acids (FISH OIL) 1000 MG CAPS TAKE 3 CAPSULES (SEA-OMEGA 30 FISH OIL) BY MOUTH TWO TIMES A DAY FOR CHOLESTEROL 01/08/20  Yes [provider]  omeprazole (PRILOSEC) 20 MG capsule Take 20 mg by mouth daily.   Yes [provider]  pantoprazole (PROTONIX) 40 MG tablet 1 tablet daily. 04/18/20  Yes [provider]  rosuvastatin (CRESTOR) 40 MG tablet Take 40 mg by mouth daily.   Yes [provider]  docusate sodium (COLACE) 100 MG capsule Take 100 mg by mouth 2 (two) times daily. Patient not taking: No sig reported    [provider]  Fluticasone-Salmeterol (ADVAIR) 250-50 MCG/DOSE AEPB Inhale 1 puff into the lungs 2 (two) times daily. Patient not taking: No sig reported    [provider]  ketotifen (ZADITOR) 0.025 % ophthalmic  solution 1 drop 2 (two) times daily.    [provider]  pseudoephedrine-acetaminophen (TYLENOL SINUS) 30-500 MG TABS tablet Take 1 tablet by mouth every 4 (four) hours as needed.    [provider]    Inpatient Medications: Scheduled Meds:  aspirin EC  81 mg Oral Daily   cholecalciferol  1,000 Units Oral Daily   enoxaparin (LOVENOX) injection  40 mg Subcutaneous QHS   ferrous sulfate  325 mg Oral Q breakfast   ipratropium-albuterol  3 mL Nebulization Q4H   multivitamin with minerals   1 tablet Oral Daily   omega-3 acid ethyl esters  1 g Oral Daily   pantoprazole  40 mg Oral Daily   rosuvastatin  40 mg Oral QHS   Continuous Infusions:  sodium chloride 10 mL/hr at 01/05/21 0354   ampicillin-sulbactam (UNASYN) IV 3 g (01/05/21 0853)   cefTRIAXone (ROCEPHIN)  IV 2 g (01/04/21 2324)   PRN Meds: sodium chloride, acetaminophen, albuterol, dextromethorphan-guaiFENesin, hydrALAZINE, levalbuterol, ondansetron (ZOFRAN) IV  Allergies:    Allergies  Allergen Reactions   Azithromycin    Doxycycline    Sulfa Antibiotics Rash and Hypertension    Social History:   Social History   Socioeconomic History   Marital status: Married    Spouse name: Not on file   Number of children: Not on file   Years of education: Not on file   Highest education level: Not on file  Occupational History   Not on file  Tobacco Use   Smoking status: Former    Packs/day: 1.00    Years: 20.00    Pack years: 20.00    Types: Cigarettes    Quit date: 03/21/1978    Years since quitting: 42.8   Smokeless tobacco: Never  Substance and Sexual Activity   Alcohol use: Not Currently   Drug use: Never   Sexual activity: Not on file  Other Topics Concern   Not on file  Social History Narrative   Not on file   Social Determinants of Health   Financial Resource Strain: Not on file  Food Insecurity: Not on file  Transportation Needs: Not on file  Physical Activity: Not on file  Stress: Not on file  Social Connections: Not on file  Intimate Partner Violence: Not on file    Family History:    Family History  Problem Relation Age of Onset   Prostate cancer Father    Prostate cancer Brother    Pancreatic cancer Brother      ROS:  Please see the history of present illness.   All other ROS reviewed and negative.     Physical Exam/Data:   Vitals:   01/04/21 2100 01/04/21 2327 01/05/21 0400 01/05/21 0804  BP: (!) 142/43 (!) 132/41 (!) 142/47 (!) 129/45  Pulse: 84 81 85 73  Resp:  18   18  Temp: 97.8 F (36.6 C) 98.2 F (36.8 C) 98.4 F (36.9 C) 97.9 F (36.6 C)  TempSrc: Oral  Oral Oral  SpO2: 98% 97% 97% 100%  Weight:      Height:        Intake/Output Summary (Last 24 hours) at 01/05/2021 0919 Last data filed at 01/04/2021 1639 Gross per 24 hour  Intake 700 ml  Output --  Net 700 ml   Last 3 Weights 01/03/2021 11/22/2019  Weight (lbs) 172 lb 182 lb 9.6 oz  Weight (kg) 78.019 kg 82.827 kg     Body mass index is 28.62 kg/m.  General:  Well  nourished, well developed, in no acute distress HEENT: normal Neck: no JVD Vascular: No carotid bruits; Distal pulses 2+ bilaterally Cardiac:  normal S1, S2; RRR; 2 out of 6 systolic murmur in the aortic area. Lungs:  clear to auscultation bilaterally, no wheezing, rhonchi or rales  Abd: soft, nontender, no hepatomegaly  Ext: no edema Musculoskeletal:  No deformities, BUE and BLE strength normal and equal Skin: warm and dry  Neuro:  CNs 2-12 intact, no focal abnormalities noted Psych:  Normal affect   EKG:  The EKG was personally reviewed and demonstrates: Normal sinus rhythm with possible old septal infarct.   Relevant CV Studies: I personally viewed his echocardiogram which showed normal LV systolic function, indeterminate diastolic function but possibly grade 2 diastolic dysfunction given mildly dilated left atrium and moderate pulmonary hypertension.  In addition, the aortic valve was moderately calcified with moderate stenosis and mild mitral regurgitation.  Laboratory Data:  High Sensitivity Troponin:   Recent Labs  Lab 01/03/21 0605 01/03/21 0838 01/03/21 1400 01/04/21 0544 01/05/21 0522  TROPONINIHS 105* 147* 107* 66* 62*     Chemistry Recent Labs  Lab 01/03/21 1046 01/04/21 0544 01/05/21 0522  NA 131* 133* 133*  K 3.8 4.3 4.1  CL 100 104 103  CO2 22 21* 22  GLUCOSE 132* 111* 194*  BUN 20 20 17   CREATININE 1.64* 1.44* 1.48*  CALCIUM 8.0* 8.0* 8.1*  MG  --  2.3  --   GFRNONAA 42* 49* 47*   ANIONGAP 9 8 8     Recent Labs  Lab 01/03/21 1046 01/05/21 0522  PROT 6.7  --   ALBUMIN 2.5* 2.3*  AST 66*  --   ALT 85*  --   ALKPHOS 87  --   BILITOT 1.3*  --    Lipids  Recent Labs  Lab 01/04/21 0544  CHOL 123  TRIG 120  HDL 13*  LDLCALC 86  CHOLHDL 9.5    Hematology Recent Labs  Lab 01/03/21 0605 01/04/21 2051 01/05/21 0522  WBC 12.1*  --  8.1  RBC 3.71* 3.54* 3.33*  HGB 10.6*  --  9.7*  HCT 32.2*  --  29.4*  MCV 86.8  --  88.3  MCH 28.6  --  29.1  MCHC 32.9  --  33.0  RDW 13.6  --  13.9  PLT 245  --  204   Thyroid No results for input(s): TSH, FREET4 in the last 168 hours.  BNP Recent Labs  Lab 01/03/21 0605  BNP 473.6*    DDimer No results for input(s): DDIMER in the last 168 hours.   Radiology/Studies:  DG Chest 2 View  Result Date: 01/03/2021 CLINICAL DATA:  Dyspnea EXAM: CHEST - 2 VIEW COMPARISON:  02/01/2013 chest radiograph. FINDINGS: Stable cardiomediastinal silhouette with normal heart size. No pneumothorax. Trace bilateral pleural effusions. Hyperinflated lungs. No pulmonary edema. Mild streaky bibasilar lung opacities. Mild hazy opacity in the right mid lung. IMPRESSION: 1. Mild hazy opacity in the right mid lung, cannot exclude mild/developing pneumonia. Follow-up chest radiograph suggested. 2. Mild streaky bibasilar lung opacities, favor atelectasis. 3. Hyperinflated lungs, suggesting COPD. 4. Trace bilateral pleural effusions. Electronically Signed   By: Ilona Sorrel M.D.   On: 01/03/2021 07:59   US RENAL  Result Date: 01/04/2021 CLINICAL DATA:  Acute kidney injury EXAM: RENAL / URINARY TRACT ULTRASOUND COMPLETE COMPARISON:  None. FINDINGS: Right Kidney: Renal measurements: 11.6 x 4.3 x 4.3 cm = volume: 111 mL. Echogenicity within normal limits. No significant atrophy. No mass  or hydronephrosis visualized. Left Kidney: Renal measurements: 12.5 x 4.9 x 4.8 cm = volume: 153 mL. No hydronephrosis. No significant atrophy. Simple cyst of the lower  pole measures up to 2.2 cm and has a benign appearance. Bladder: Appears normal for degree of bladder distention. Other: None. IMPRESSION: No significant atrophy, hydronephrosis or evidence of abnormal renal echogenicity. Benign simple cyst of the left kidney. Electronically Signed   By: Aletta Edouard M.D.   On: 01/04/2021 09:38   ECHOCARDIOGRAM COMPLETE  Result Date: 01/04/2021    ECHOCARDIOGRAM REPORT   Patient Name:   Phillip Chavez Date of Exam: 01/04/2021 Medical Rec #:  [id number]      Height:       65.0 in Accession #:    [id number]     Weight:       172.0 lb Date of Birth:  1940/10/15      BSA:          1.855 m Patient Age:    55 years       BP:           133/37 mmHg Patient Gender: M              HR:           76 bpm. Exam Location:  [facility] Procedure: 2D Echo, 3D Echo and Strain Analysis Indications:     CHF I50.31  History:         Patient has no prior history of Echocardiogram examinations.  Sonographer:     Kathlen Brunswick RDCS Referring Phys:  [id number] Soledad Gerlach NIU Diagnosing Phys: Kate Sable MD  Sonographer Comments: Global longitudinal strain was attempted. IMPRESSIONS  1. Left ventricular ejection fraction, by estimation, is 65 to 70%. The left ventricle has normal function. The left ventricle has no regional wall motion abnormalities. Left ventricular diastolic parameters were normal. The average left ventricular global longitudinal strain is -23.6 %. The global longitudinal strain is normal.  2. Right ventricular systolic function is normal. The right ventricular size is normal.  3. Left atrial size was mildly dilated.  4. Right atrial size was mildly dilated.  5. The mitral valve is normal in structure. Mild mitral valve regurgitation.  6. The aortic valve is calcified. Aortic valve regurgitation is mild. Mild aortic valve stenosis.  7. The inferior vena cava is dilated in size with >50% respiratory variability, suggesting right atrial pressure of 8 mmHg. FINDINGS  Left Ventricle: Left  ventricular ejection fraction, by estimation, is 65 to 70%. The left ventricle has normal function. The left ventricle has no regional wall motion abnormalities. The average left ventricular global longitudinal strain is -23.6 %. The global longitudinal strain is normal. 3D left ventricular ejection fraction analysis performed but not reported based on interpreter judgement due to suboptimal quality. The left ventricular internal cavity size was normal in size. There is no left ventricular hypertrophy. Left ventricular diastolic parameters were normal. Right Ventricle: The right ventricular size is normal. No increase in right ventricular wall thickness. Right ventricular systolic function is normal. Left Atrium: Left atrial size was mildly dilated. Right Atrium: Right atrial size was mildly dilated. Pericardium: There is no evidence of pericardial effusion. Mitral Valve: The mitral valve is normal in structure. Mild mitral valve regurgitation. Tricuspid Valve: The tricuspid valve is normal in structure. Tricuspid valve regurgitation is trivial. Aortic Valve: The aortic valve is calcified. Aortic valve regurgitation is mild. Aortic regurgitation PHT measures 362 msec. Mild aortic stenosis is present. Aortic valve  mean gradient measures 13.3 mmHg. Aortic valve peak gradient measures 28.9 mmHg. Aortic valve area, by VTI measures 1.95 cm. Pulmonic Valve: The pulmonic valve was not well visualized. Pulmonic valve regurgitation is not visualized. Aorta: The aortic root is normal in size and structure. Venous: The inferior vena cava is dilated in size with greater than 50% respiratory variability, suggesting right atrial pressure of 8 mmHg. IAS/Shunts: No atrial level shunt detected by color flow Doppler.  LEFT VENTRICLE PLAX 2D LVIDd:         5.50 cm   Diastology LVIDs:         3.04 cm   LV e' medial:    11.20 cm/s LV PW:         1.04 cm   LV E/e' medial:  10.9 LV IVS:        1.01 cm   LV e' lateral:   10.30 cm/s LVOT  diam:     2.10 cm   LV E/e' lateral: 11.8 LV SV:         112 LV SV Index:   60        2D Longitudinal Strain LVOT Area:     3.46 cm  2D Strain GLS Avg:     -23.6 %                           3D Volume EF:                          3D EF:        75 %                          LV EDV:       200 ml                          LV ESV:       51 ml                          LV SV:        149 ml RIGHT VENTRICLE RV Basal diam:  3.59 cm RV S prime:     20.20 cm/s TAPSE (M-mode): 3.7 cm LEFT ATRIUM             Index        RIGHT ATRIUM           Index LA diam:        4.60 cm 2.48 cm/m   RA Area:     19.30 cm LA Vol (A2C):   50.9 ml 27.43 ml/m  RA Volume:   66.30 ml  35.74 ml/m LA Vol (A4C):   73.2 ml 39.45 ml/m LA Biplane Vol: 63.8 ml 34.39 ml/m  AORTIC VALVE                     PULMONIC VALVE AV Area (Vmax):    2.20 cm      PV Vmax:       0.94 m/s AV Area (Vmean):   2.09 cm      PV Peak grad:  3.5 mmHg AV Area (VTI):     1.95 cm AV Vmax:           268.67 cm/s AV Vmean:  163.333 cm/s AV VTI:            0.576 m AV Peak Grad:      28.9 mmHg AV Mean Grad:      13.3 mmHg LVOT Vmax:         171.00 cm/s LVOT Vmean:        98.500 cm/s LVOT VTI:          0.324 m LVOT/AV VTI ratio: 0.56 AI PHT:            362 msec  AORTA Ao Root diam: 3.00 cm MITRAL VALVE                TRICUSPID VALVE MV Area (PHT): 3.66 cm     TV Peak grad:   41.7 mmHg MV Decel Time: 207 msec     TV Vmax:        3.23 m/s MV E velocity: 122.00 cm/s MV A velocity: 89.40 cm/s   SHUNTS MV E/A ratio:  1.36         Systemic VTI:  0.32 m                             Systemic Diam: 2.10 cm Kate Sable MD Electronically signed by Kate Sable MD Signature Date/Time: 01/04/2021/1:36:54 PM    Final      Assessment and Plan:   Enterococcus bacteremia: Unclear source at this time but this is usually a GI pathogen.  The patient does report some constipation and might require further GI evaluation at some point.  I agree that endocarditis has to be  excluded especially with the presence of calcified aortic valve with moderate stenosis.  I could not see the leaflets of the mitral valve very well on TTE.  Recommend proceeding with a transesophageal echocardiogram tomorrow.  I discussed the procedure in details as well as risks and benefits.  This was discussed with the patient and his wife. Acute systolic heart failure: I personally reviewed his echocardiogram and he likely has grade 2 diastolic dysfunction.  The patient improved with 1 dose of IV furosemide and currently appears to be euvolemic.  We will use diuretics as needed. Back and neck pain: Concern for possible discitis with plans for imaging. Atypical chest pain and slightly abnormal EKG: Mildly elevated troponin likely in the setting of diastolic heart failure.  Patient benefits from outpatient ischemic cardiac evaluation.    For questions or updates, please contact [place] Please consult [email or web address] for contact info under    Signed, Kathlyn Sacramento, MD  01/05/2021 9:19 AM

## 2021-01-05 NOTE — Progress Notes (Signed)
PROGRESS NOTE    Phillip Chavez  AVW:[id number] DOB: 1940/09/07 DOA: 01/03/2021 PCP: [place]   Brief Narrative: Taken from H&P. Phillip Chavez is a 80 y.o. male with medical history significant of hypertension, hyperlipidemia, COPD not on oxygen at home, GERD, recently diagnosed CHF in [place], who presents with shortness of breath.   Patient states that he has been having upper respiratory symptoms of for almost a month. He was initially seen at the [place] and diagnosed with viral upper respiratory tract infection.  This lasted for about 10 days and he seemed to get somewhat better but then symptoms returned in past week.  He has shortness of breath, productive cough with greenish colored sputum production. Patient has fever and chills.  Patient also has worsening bilateral lower extremity edema and orthopnea.  Per patient he was recently diagnosed with CHF but no echo done yet.  He was also hypoxic at 89% on arrival to ED which improved to high 90s on 2 L of oxygen. He met sepsis criteria with temperature of 100.4, leukocytosis and chest x-ray concerning for right middle lobe infiltrate and streaky bilateral basilar opacities.  AKI with creatinine of 1.64, no recent baseline, prior creatinine in the system was 1.17 in December 2014. Preliminary blood cultures with Enterococcus faecalis. Initially started on Levaquin which were switched to Unasyn . Echo with normal EF, no regional wall motion abnormalities, normal diastolic function and mild aortic stenosis and no vegetations noted.  ID was consulted for Enterococcus bacteremia, they added ceftriaxone empirically for endocarditis until patient had TEE to rule it out completely.  Cardiology consulted for TEE.  Patient was also having neck and back pain, MRI of cervical, thoracic and lumbar spine ordered to rule out any discitis or osteomyelitis.  Subjective: Patient was feeling worsening weakness and some dyspnea.  Intermittent neck and  midthoracic pain.  Assessment & Plan:   Principal Problem:   Acute respiratory failure with hypoxia (HCC) Active Problems:   CAP (community acquired pneumonia)   Sepsis ([place])   COPD (chronic obstructive pulmonary disease) (HCC)   GERD (gastroesophageal reflux disease)   HTN (hypertension)   HLD (hyperlipidemia)   CHF exacerbation (HCC)   Elevated troponin   AKI (acute kidney injury) ([place])   Normocytic anemia  Sepsis secondary to CAP and Enterococcus faecalis bacteremia.  Met sepsis criteria with fever, leukocytosis and concern of pneumonia, later blood cultures came back positive for Enterococcus faecalis.  Patient received 1 dose of vancomycin and Rocephin in ED and then started on Levaquin. Levaquin was switched with Unasyn  based on preliminary blood cultures.  Azithromycin allergies are also listed.  ID added ceftriaxone empirically for endocarditis, we can discontinue once TEE normal. Patient was also having some back and neck pain-MRI of cervical, thoracic and lumbar spine added to rule out any discitis or osteomyelitis. -Continue with Unasyn  -Continue with ceftriaxone -Follow-up final blood culture results. -Follow-up on TEE and MR spine -Continue with supportive care -Try weaning from oxygen.  Acute respiratory failure with hypoxia secondary to pneumonia and CHF exacerbation.  Patient was hypoxic in high 80s on room air requiring up to 2 L of oxygen.  No baseline oxygen requirement.  JVD elevated, bilateral lower extremity edema and BNP at 473 with crackles on auscultation on admission which met criteria with CHF exacerbation.  Echocardiogram within normal limits with mild aortic stenosis. -Try weaning from oxygen as tolerated  CHF exacerbation.  Met criteria for CHF exacerbation as described above.  Received 1 dose of IV Lasix, which was not continued due to concern of sepsis. -Echocardiogram with normal EF and diastolic parameters, mild aortic stenosis. -Daily BMP and  weight -Strict intake and output  Elevated troponin.  Peaked at 147 and now trending down.  Likely secondary to demand ischemia with CHF exacerbation.  No chest pain. -Continue with aspirin and Crestor.  COPD.  No wheezing on admission. -Continue with bronchodilators  Hypertension.  Blood pressure within goal. -Keep holding home dose of Cozaar for concern of AKI. -IV hydralazine as needed  AKI (acute kidney injury) ([place]): No recent baseline creatinine available.  Patient had creatinine 1.17 on 02/01/2013.  It was 1.64 on admission and now started trending down to 1.44>1.48 Renal ultrasound without any abnormality, simple renal cyst.  Might be his new baseline. -Monitor renal function -Avoid nephrotoxins  Normocytic anemia: Hemoglobin 10.6>>9.7.  Anemia panel with anemia of chronic disease and some iron deficiency. -Start him on iron supplement -Follow-up with CBC  GERD (gastroesophageal reflux disease) -Protonix  HLD (hyperlipidemia) -Crestor  Generalized weakness. -PT/OT evaluation  Objective: Vitals:   01/04/21 1836 01/04/21 2100 01/04/21 2327 01/05/21 0400  BP: (!) 126/41 (!) 142/43 (!) 132/41 (!) 142/47  Pulse: 78 84 81 85  Resp: 18  18   Temp: 97.9 F (36.6 C) 97.8 F (36.6 C) 98.2 F (36.8 C) 98.4 F (36.9 C)  TempSrc:  Oral  Oral  SpO2: 100% 98% 97% 97%  Weight:      Height:        Intake/Output Summary (Last 24 hours) at 01/05/2021 0801 Last data filed at 01/04/2021 1639 Gross per 24 hour  Intake 800 ml  Output --  Net 800 ml    Filed Weights   01/03/21 0603  Weight: 78 kg    Examination:  General.  Chronically ill-appearing elderly man, in no acute distress. Pulmonary.  Lungs clear bilaterally, normal respiratory effort. CV.  Regular rate and rhythm, no JVD, rub or murmur. Abdomen.  Soft, nontender, nondistended, BS positive. CNS.  Alert and oriented .  No focal neurologic deficit. Extremities.  No edema, no cyanosis, pulses intact and  symmetrical. Psychiatry.  Judgment and insight appears normal.   DVT prophylaxis: Lovenox Code Status: Full Family Communication: Discussed with wife and granddaughter at bedside. Disposition Plan:  Status is: Inpatient  Remains inpatient appropriate because:  of the severity of illness  Level of care: Progressive Cardiac  All the records are reviewed and case discussed with Care Management/Social Worker. Management plans discussed with the patient, nursing and they are in agreement.  Consultants:  None  Procedures:  Antimicrobials:  Unasyn Ceftriaxone  Data Reviewed: I have personally reviewed following labs and imaging studies  CBC: Recent Labs  Lab 01/03/21 0605 01/05/21 0522  WBC 12.1* 8.1  NEUTROABS 9.3*  --   HGB 10.6* 9.7*  HCT 32.2* 29.4*  MCV 86.8 88.3  PLT 245 155    Basic Metabolic Panel: Recent Labs  Lab 01/03/21 0605 01/03/21 1046 01/04/21 0544 01/05/21 0522  NA 130* 131* 133* 133*  K 4.3 3.8 4.3 4.1  CL 99 100 104 103  CO2 24 22 21* 22  GLUCOSE 119* 132* 111* 194*  BUN '17 20 20 17  ' CREATININE 1.54* 1.64* 1.44* 1.48*  CALCIUM 8.2* 8.0* 8.0* 8.1*  MG  --   --  2.3  --   PHOS  --   --   --  3.7    GFR: Estimated Creatinine Clearance: 37.1 mL/min (A) (  by C-G formula based on SCr of 1.48 mg/dL (H)). Liver Function Tests: Recent Labs  Lab 01/03/21 1046 01/05/21 0522  AST 66*  --   ALT 85*  --   ALKPHOS 87  --   BILITOT 1.3*  --   PROT 6.7  --   ALBUMIN 2.5* 2.3*    No results for input(s): LIPASE, AMYLASE in the last 168 hours. No results for input(s): AMMONIA in the last 168 hours. Coagulation Profile: No results for input(s): INR, PROTIME in the last 168 hours. Cardiac Enzymes: No results for input(s): CKTOTAL, CKMB, CKMBINDEX, TROPONINI in the last 168 hours. BNP (last 3 results) No results for input(s): PROBNP in the last 8760 hours. HbA1C: No results for input(s): HGBA1C in the last 72 hours. CBG: No results for  input(s): GLUCAP in the last 168 hours. Lipid Profile: Recent Labs    01/04/21 0544  CHOL 123  HDL 13*  LDLCALC 86  TRIG 120  CHOLHDL 9.5    Thyroid Function Tests: No results for input(s): TSH, T4TOTAL, FREET4, T3FREE, THYROIDAB in the last 72 hours. Anemia Panel: Recent Labs    01/04/21 0544 01/04/21 2051  VITAMINB12  --  382  FOLATE 27.0  --   FERRITIN 288  --   TIBC 225*  --   IRON 18*  --   RETICCTPCT  --  1.6   Sepsis Labs: Recent Labs  Lab 01/03/21 0917 01/03/21 1046  PROCALCITON  --  0.18  LATICACIDVEN 1.0  --      Recent Results (from the past 240 hour(s))  Resp Panel by RT-PCR (Flu A&B, Covid) Nasopharyngeal Swab     Status: None   Collection Time: 01/03/21  6:06 AM   Specimen: Nasopharyngeal Swab; Nasopharyngeal(NP) swabs in vial transport medium  Result Value Ref Range Status   SARS Coronavirus 2 by RT PCR NEGATIVE NEGATIVE Final    Comment: (NOTE) SARS-CoV-2 target nucleic acids are NOT DETECTED.  The SARS-CoV-2 RNA is generally detectable in upper respiratory specimens during the acute phase of infection. The lowest concentration of SARS-CoV-2 viral copies this assay can detect is 138 copies/mL. A negative result does not preclude SARS-Cov-2 infection and should not be used as the sole basis for treatment or other patient management decisions. A negative result may occur with  improper specimen collection/handling, submission of specimen other than nasopharyngeal swab, presence of viral mutation(s) within the areas targeted by this assay, and inadequate number of viral copies(<138 copies/mL). A negative result must be combined with clinical observations, patient history, and epidemiological information. The expected result is Negative.  Fact Sheet for Patients:  EntrepreneurPulse.com.au  Fact Sheet for Healthcare Providers:  IncredibleEmployment.be  This test is no t yet approved or cleared by the [place] FDA and  has been authorized for detection and/or diagnosis of SARS-CoV-2 by FDA under an Emergency Use Authorization (EUA). This EUA will remain  in effect (meaning this test can be used) for the duration of the COVID-19 declaration under Section 564(b)(1) of the Act, 21 U.S.C.section 360bbb-3(b)(1), unless the authorization is terminated  or revoked sooner.       Influenza A by PCR NEGATIVE NEGATIVE Final   Influenza B by PCR NEGATIVE NEGATIVE Final    Comment: (NOTE) The Xpert Xpress SARS-CoV-2/FLU/RSV plus assay is intended as an aid in the diagnosis of influenza from Nasopharyngeal swab specimens and should not be used as a sole basis for treatment. Nasal washings and aspirates are unacceptable for Xpert Xpress SARS-CoV-2/FLU/RSV testing.  Fact Sheet for Patients: EntrepreneurPulse.com.au  Fact Sheet for Healthcare Providers: IncredibleEmployment.be  This test is not yet approved or cleared by the [place] FDA and has been authorized for detection and/or diagnosis of SARS-CoV-2 by FDA under an Emergency Use Authorization (EUA). This EUA will remain in effect (meaning this test can be used) for the duration of the COVID-19 declaration under Section 564(b)(1) of the Act, 21 U.S.C. section 360bbb-3(b)(1), unless the authorization is terminated or revoked.  Performed at [facility], [address] [place], [place] [address]   Blood culture (routine x 2)     Status: None (Preliminary result)   Collection Time: 01/03/21  9:17 AM   Specimen: BLOOD  Result Value Ref Range Status   Specimen Description BLOOD LEFT ANTECUBITAL  Final   Special Requests   Final    BOTTLES DRAWN AEROBIC AND ANAEROBIC Blood Culture adequate volume   Culture  Setup Time   Final    GRAM POSITIVE COCCI IN BOTH AEROBIC AND ANAEROBIC BOTTLES CRITICAL RESULT CALLED TO, READ BACK BY AND VERIFIED WITH: NATHAN BELUE AT 6384 01/04/21.PMF Performed  at [facility], [address] [place], [place] [address]    Culture [facility] POSITIVE COCCI  Final   Report Status PENDING  Incomplete  Blood culture (routine x 2)     Status: None (Preliminary result)   Collection Time: 01/03/21  9:17 AM   Specimen: BLOOD LEFT HAND  Result Value Ref Range Status   Specimen Description BLOOD LEFT HAND  Final   Special Requests   Final    BOTTLES DRAWN AEROBIC AND ANAEROBIC Blood Culture adequate volume   Culture  Setup Time   Final    Organism ID to follow GRAM POSITIVE COCCI IN BOTH AEROBIC AND ANAEROBIC BOTTLES CRITICAL RESULT CALLED TO, READ BACK BY AND VERIFIED WITH: NATHAN BELUE AT 8032 01/04/21.PMF Performed at [facility], [address] [place], [place] [address]    Culture [facility] POSITIVE COCCI  Final   Report Status PENDING  Incomplete  Blood Culture ID Panel (Reflexed)     Status: Abnormal   Collection Time: 01/03/21  9:17 AM  Result Value Ref Range Status   Enterococcus faecalis DETECTED (A) NOT DETECTED Final    Comment: CRITICAL RESULT CALLED TO, READ BACK BY AND VERIFIED WITH: NATHAN BELUE AT 2500 01/04/21.PMF    Enterococcus Faecium NOT DETECTED NOT DETECTED Final   Listeria monocytogenes NOT DETECTED NOT DETECTED Final   Staphylococcus species NOT DETECTED NOT DETECTED Final   Staphylococcus aureus (BCID) NOT DETECTED NOT DETECTED Final   Staphylococcus epidermidis NOT DETECTED NOT DETECTED Final   Staphylococcus lugdunensis NOT DETECTED NOT DETECTED Final   Streptococcus species NOT DETECTED NOT DETECTED Final   Streptococcus agalactiae NOT DETECTED NOT DETECTED Final   Streptococcus pneumoniae NOT DETECTED NOT DETECTED Final   Streptococcus pyogenes NOT DETECTED NOT DETECTED Final   A.calcoaceticus-baumannii NOT DETECTED NOT DETECTED Final   Bacteroides fragilis NOT DETECTED NOT DETECTED Final   Enterobacterales NOT DETECTED NOT DETECTED Final   Enterobacter cloacae complex NOT DETECTED NOT DETECTED Final    Escherichia coli NOT DETECTED NOT DETECTED Final   Klebsiella aerogenes NOT DETECTED NOT DETECTED Final   Klebsiella oxytoca NOT DETECTED NOT DETECTED Final   Klebsiella pneumoniae NOT DETECTED NOT DETECTED Final   Proteus species NOT DETECTED NOT DETECTED Final   Salmonella species NOT DETECTED NOT DETECTED Final   Serratia marcescens NOT DETECTED NOT DETECTED Final   Haemophilus influenzae NOT DETECTED  NOT DETECTED Final   Neisseria meningitidis NOT DETECTED NOT DETECTED Final   Pseudomonas aeruginosa NOT DETECTED NOT DETECTED Final   Stenotrophomonas maltophilia NOT DETECTED NOT DETECTED Final   Candida albicans NOT DETECTED NOT DETECTED Final   Candida auris NOT DETECTED NOT DETECTED Final   Candida glabrata NOT DETECTED NOT DETECTED Final   Candida krusei NOT DETECTED NOT DETECTED Final   Candida parapsilosis NOT DETECTED NOT DETECTED Final   Candida tropicalis NOT DETECTED NOT DETECTED Final   Cryptococcus neoformans/gattii NOT DETECTED NOT DETECTED Final   Vancomycin resistance NOT DETECTED NOT DETECTED Final    Comment: Performed at [facility], [address] [place], [place] [address]      Radiology Studies: US RENAL  Result Date: 01/04/2021 CLINICAL DATA:  Acute kidney injury EXAM: RENAL / URINARY TRACT ULTRASOUND COMPLETE COMPARISON:  None. FINDINGS: Right Kidney: Renal measurements: 11.6 x 4.3 x 4.3 cm = volume: 111 mL. Echogenicity within normal limits. No significant atrophy. No mass or hydronephrosis visualized. Left Kidney: Renal measurements: 12.5 x 4.9 x 4.8 cm = volume: 153 mL. No hydronephrosis. No significant atrophy. Simple cyst of the lower pole measures up to 2.2 cm and has a benign appearance. Bladder: Appears normal for degree of bladder distention. Other: None. IMPRESSION: No significant atrophy, hydronephrosis or evidence of abnormal renal echogenicity. Benign simple cyst of the left kidney. Electronically Signed   By: Aletta Edouard M.D.   On:  01/04/2021 09:38   ECHOCARDIOGRAM COMPLETE  Result Date: 01/04/2021    ECHOCARDIOGRAM REPORT   Patient Name:   Phillip Chavez Date of Exam: 01/04/2021 Medical Rec #:  [id number]      Height:       65.0 in Accession #:    [id number]     Weight:       172.0 lb Date of Birth:  1941-02-02      BSA:          1.855 m Patient Age:    57 years       BP:           133/37 mmHg Patient Gender: M              HR:           76 bpm. Exam Location:  [facility] Procedure: 2D Echo, 3D Echo and Strain Analysis Indications:     CHF I50.31  History:         Patient has no prior history of Echocardiogram examinations.  Sonographer:     Kathlen Brunswick RDCS Referring Phys:  [id number] Soledad Gerlach NIU Diagnosing Phys: Kate Sable MD  Sonographer Comments: Global longitudinal strain was attempted. IMPRESSIONS  1. Left ventricular ejection fraction, by estimation, is 65 to 70%. The left ventricle has normal function. The left ventricle has no regional wall motion abnormalities. Left ventricular diastolic parameters were normal. The average left ventricular global longitudinal strain is -23.6 %. The global longitudinal strain is normal.  2. Right ventricular systolic function is normal. The right ventricular size is normal.  3. Left atrial size was mildly dilated.  4. Right atrial size was mildly dilated.  5. The mitral valve is normal in structure. Mild mitral valve regurgitation.  6. The aortic valve is calcified. Aortic valve regurgitation is mild. Mild aortic valve stenosis.  7. The inferior vena cava is dilated in size with >50% respiratory variability, suggesting right atrial pressure of 8 mmHg. FINDINGS  Left Ventricle: Left ventricular ejection fraction, by estimation, is 65  to 70%. The left ventricle has normal function. The left ventricle has no regional wall motion abnormalities. The average left ventricular global longitudinal strain is -23.6 %. The global longitudinal strain is normal. 3D left ventricular ejection fraction analysis  performed but not reported based on interpreter judgement due to suboptimal quality. The left ventricular internal cavity size was normal in size. There is no left ventricular hypertrophy. Left ventricular diastolic parameters were normal. Right Ventricle: The right ventricular size is normal. No increase in right ventricular wall thickness. Right ventricular systolic function is normal. Left Atrium: Left atrial size was mildly dilated. Right Atrium: Right atrial size was mildly dilated. Pericardium: There is no evidence of pericardial effusion. Mitral Valve: The mitral valve is normal in structure. Mild mitral valve regurgitation. Tricuspid Valve: The tricuspid valve is normal in structure. Tricuspid valve regurgitation is trivial. Aortic Valve: The aortic valve is calcified. Aortic valve regurgitation is mild. Aortic regurgitation PHT measures 362 msec. Mild aortic stenosis is present. Aortic valve mean gradient measures 13.3 mmHg. Aortic valve peak gradient measures 28.9 mmHg. Aortic valve area, by VTI measures 1.95 cm. Pulmonic Valve: The pulmonic valve was not well visualized. Pulmonic valve regurgitation is not visualized. Aorta: The aortic root is normal in size and structure. Venous: The inferior vena cava is dilated in size with greater than 50% respiratory variability, suggesting right atrial pressure of 8 mmHg. IAS/Shunts: No atrial level shunt detected by color flow Doppler.  LEFT VENTRICLE PLAX 2D LVIDd:         5.50 cm   Diastology LVIDs:         3.04 cm   LV e' medial:    11.20 cm/s LV PW:         1.04 cm   LV E/e' medial:  10.9 LV IVS:        1.01 cm   LV e' lateral:   10.30 cm/s LVOT diam:     2.10 cm   LV E/e' lateral: 11.8 LV SV:         112 LV SV Index:   60        2D Longitudinal Strain LVOT Area:     3.46 cm  2D Strain GLS Avg:     -23.6 %                           3D Volume EF:                          3D EF:        75 %                          LV EDV:       200 ml                           LV ESV:       51 ml                          LV SV:        149 ml RIGHT VENTRICLE RV Basal diam:  3.59 cm RV S prime:     20.20 cm/s TAPSE (M-mode): 3.7 cm LEFT ATRIUM             Index  RIGHT ATRIUM           Index LA diam:        4.60 cm 2.48 cm/m   RA Area:     19.30 cm LA Vol (A2C):   50.9 ml 27.43 ml/m  RA Volume:   66.30 ml  35.74 ml/m LA Vol (A4C):   73.2 ml 39.45 ml/m LA Biplane Vol: 63.8 ml 34.39 ml/m  AORTIC VALVE                     PULMONIC VALVE AV Area (Vmax):    2.20 cm      PV Vmax:       0.94 m/s AV Area (Vmean):   2.09 cm      PV Peak grad:  3.5 mmHg AV Area (VTI):     1.95 cm AV Vmax:           268.67 cm/s AV Vmean:          163.333 cm/s AV VTI:            0.576 m AV Peak Grad:      28.9 mmHg AV Mean Grad:      13.3 mmHg LVOT Vmax:         171.00 cm/s LVOT Vmean:        98.500 cm/s LVOT VTI:          0.324 m LVOT/AV VTI ratio: 0.56 AI PHT:            362 msec  AORTA Ao Root diam: 3.00 cm MITRAL VALVE                TRICUSPID VALVE MV Area (PHT): 3.66 cm     TV Peak grad:   41.7 mmHg MV Decel Time: 207 msec     TV Vmax:        3.23 m/s MV E velocity: 122.00 cm/s MV A velocity: 89.40 cm/s   SHUNTS MV E/A ratio:  1.36         Systemic VTI:  0.32 m                             Systemic Diam: 2.10 cm Kate Sable MD Electronically signed by Kate Sable MD Signature Date/Time: 01/04/2021/1:36:54 PM    Final     Scheduled Meds:  aspirin EC  81 mg Oral Daily   cholecalciferol  1,000 Units Oral Daily   enoxaparin (LOVENOX) injection  40 mg Subcutaneous QHS   ipratropium-albuterol  3 mL Nebulization Q4H   multivitamin with minerals  1 tablet Oral Daily   omega-3 acid ethyl esters  1 g Oral Daily   pantoprazole  40 mg Oral Daily   rosuvastatin  40 mg Oral QHS   Continuous Infusions:  sodium chloride 10 mL/hr at 01/05/21 0354   ampicillin-sulbactam (UNASYN) IV 3 g (01/05/21 0355)   cefTRIAXone (ROCEPHIN)  IV 2 g (01/04/21 2324)     LOS: 2 days   Time spent: 45  minutes. More than 50% of the time was spent in counseling/coordination of care  Lorella Nimrod, MD [facility]  If 7PM-7AM, please contact night-coverage [email or web address]  01/05/2021, 8:01 AM   This record has been created using Systems analyst. Errors have been sought and corrected,but may not always be located. Such creation errors do not reflect on the standard of care.

## 2021-01-05 NOTE — Progress Notes (Signed)
Date of Admission:  01/03/2021      ID: Phillip Chavez is a 80 y.o. male  Principal Problem:   Acute respiratory failure with hypoxia ([place]) Active Problems:   CAP (community acquired pneumonia)   Sepsis ([place])   COPD (chronic obstructive pulmonary disease) (HCC)   GERD (gastroesophageal reflux disease)   HTN (hypertension)   HLD (hyperlipidemia)   CHF exacerbation (H[facility])   Elevated troponin   AKI (acute kidney injury) ([place])   Normocytic anemia    Subjective: Pt is sleeping after getting ativan for MRI As per his wife no diarrhea He has been doing okay   Medications:   aspirin EC  81 mg Oral Daily   cholecalciferol  1,000 Units Oral Daily   enoxaparin (LOVENOX) injection  40 mg Subcutaneous QHS   ferrous sulfate  325 mg Oral Q breakfast   ipratropium-albuterol  3 mL Nebulization Q6H   LORazepam  1 mg Oral Once   multivitamin with minerals  1 tablet Oral Daily   omega-3 acid ethyl esters  1 g Oral Daily   pantoprazole  40 mg Oral Daily   rosuvastatin  40 mg Oral QHS    Objective: Vital signs in last 24 hours: Temp:  [97.6 F (36.4 C)-98.4 F (36.9 C)] 97.6 F (36.4 C) (11/07 1145) Pulse Rate:  [73-89] 89 (11/07 1145) Resp:  [18-23] 23 (11/07 1145) BP: (121-159)/(35-47) 159/35 (11/07 1145) SpO2:  [93 %-100 %] 97 % (11/07 1145)  PHYSICAL EXAM:  General: sleeping Head: Normocephalic, without obvious abnormality, atraumatic. Lungs: b/l air entry Heart: Regular rate and rhythm, no murmur, rub or gallop. Abdomen: Soft, non-tender,not distended. Bowel sounds normal. No masses Extremities: atraumatic, no cyanosis. No edema. No clubbing Skin: No rashes or lesions. Or bruising Lymph: Cervical, supraclavicular normal. Neurologic: did not examine  Lab Results Recent Labs    01/03/21 0605 01/03/21 1046 01/04/21 0544 01/05/21 0522  WBC 12.1*  --   --  8.1  HGB 10.6*  --   --  9.7*  HCT 32.2*  --   --  29.4*  NA 130*   < > 133* 133*  K 4.3   < > 4.3 4.1  CL 99    < > 104 103  CO2 24   < > 21* 22  BUN 17   < > 20 17  CREATININE 1.54*   < > 1.44* 1.48*   < > = values in this interval not displayed.   Liver Panel Recent Labs    01/03/21 1046 01/05/21 0522  PROT 6.7  --   ALBUMIN 2.5* 2.3*  AST 66*  --   ALT 85*  --   ALKPHOS 87  --   BILITOT 1.3*  --    Microbiology: 01/03/21 BC - Enerococcus 01/04/21 UC Studies/Results: US RENAL  Result Date: 01/04/2021 CLINICAL DATA:  Acute kidney injury EXAM: RENAL / URINARY TRACT ULTRASOUND COMPLETE COMPARISON:  None. FINDINGS: Right Kidney: Renal measurements: 11.6 x 4.3 x 4.3 cm = volume: 111 mL. Echogenicity within normal limits. No significant atrophy. No mass or hydronephrosis visualized. Left Kidney: Renal measurements: 12.5 x 4.9 x 4.8 cm = volume: 153 mL. No hydronephrosis. No significant atrophy. Simple cyst of the lower pole measures up to 2.2 cm and has a benign appearance. Bladder: Appears normal for degree of bladder distention. Other: None. IMPRESSION: No significant atrophy, hydronephrosis or evidence of abnormal renal echogenicity. Benign simple cyst of the left kidney. Electronically Signed   By: Aletta Edouard  M.D.   On: 01/04/2021 09:38   ECHOCARDIOGRAM COMPLETE  Result Date: 01/04/2021    ECHOCARDIOGRAM REPORT   Patient Name:   Phillip Chavez Date of Exam: 01/04/2021 Medical Rec #:  [id number]      Height:       65.0 in Accession #:    [id number]     Weight:       172.0 lb Date of Birth:  10-May-1940      BSA:          1.855 m Patient Age:    81 years       BP:           133/37 mmHg Patient Gender: M              HR:           76 bpm. Exam Location:  [facility] Procedure: 2D Echo, 3D Echo and Strain Analysis Indications:     CHF I50.31  History:         Patient has no prior history of Echocardiogram examinations.  Sonographer:     Kathlen Brunswick RDCS Referring Phys:  [id number] Soledad Gerlach NIU Diagnosing Phys: Kate Sable MD  Sonographer Comments: Global longitudinal strain was attempted. IMPRESSIONS  1.  Left ventricular ejection fraction, by estimation, is 65 to 70%. The left ventricle has normal function. The left ventricle has no regional wall motion abnormalities. Left ventricular diastolic parameters were normal. The average left ventricular global longitudinal strain is -23.6 %. The global longitudinal strain is normal.  2. Right ventricular systolic function is normal. The right ventricular size is normal.  3. Left atrial size was mildly dilated.  4. Right atrial size was mildly dilated.  5. The mitral valve is normal in structure. Mild mitral valve regurgitation.  6. The aortic valve is calcified. Aortic valve regurgitation is mild. Mild aortic valve stenosis.  7. The inferior vena cava is dilated in size with >50% respiratory variability, suggesting right atrial pressure of 8 mmHg. FINDINGS  Left Ventricle: Left ventricular ejection fraction, by estimation, is 65 to 70%. The left ventricle has normal function. The left ventricle has no regional wall motion abnormalities. The average left ventricular global longitudinal strain is -23.6 %. The global longitudinal strain is normal. 3D left ventricular ejection fraction analysis performed but not reported based on interpreter judgement due to suboptimal quality. The left ventricular internal cavity size was normal in size. There is no left ventricular hypertrophy. Left ventricular diastolic parameters were normal. Right Ventricle: The right ventricular size is normal. No increase in right ventricular wall thickness. Right ventricular systolic function is normal. Left Atrium: Left atrial size was mildly dilated. Right Atrium: Right atrial size was mildly dilated. Pericardium: There is no evidence of pericardial effusion. Mitral Valve: The mitral valve is normal in structure. Mild mitral valve regurgitation. Tricuspid Valve: The tricuspid valve is normal in structure. Tricuspid valve regurgitation is trivial. Aortic Valve: The aortic valve is calcified. Aortic  valve regurgitation is mild. Aortic regurgitation PHT measures 362 msec. Mild aortic stenosis is present. Aortic valve mean gradient measures 13.3 mmHg. Aortic valve peak gradient measures 28.9 mmHg. Aortic valve area, by VTI measures 1.95 cm. Pulmonic Valve: The pulmonic valve was not well visualized. Pulmonic valve regurgitation is not visualized. Aorta: The aortic root is normal in size and structure. Venous: The inferior vena cava is dilated in size with greater than 50% respiratory variability, suggesting right atrial pressure of 8 mmHg. IAS/Shunts: No atrial level shunt detected by  color flow Doppler.  LEFT VENTRICLE PLAX 2D LVIDd:         5.50 cm   Diastology LVIDs:         3.04 cm   LV e' medial:    11.20 cm/s LV PW:         1.04 cm   LV E/e' medial:  10.9 LV IVS:        1.01 cm   LV e' lateral:   10.30 cm/s LVOT diam:     2.10 cm   LV E/e' lateral: 11.8 LV SV:         112 LV SV Index:   60        2D Longitudinal Strain LVOT Area:     3.46 cm  2D Strain GLS Avg:     -23.6 %                           3D Volume EF:                          3D EF:        75 %                          LV EDV:       200 ml                          LV ESV:       51 ml                          LV SV:        149 ml RIGHT VENTRICLE RV Basal diam:  3.59 cm RV S prime:     20.20 cm/s TAPSE (M-mode): 3.7 cm LEFT ATRIUM             Index        RIGHT ATRIUM           Index LA diam:        4.60 cm 2.48 cm/m   RA Area:     19.30 cm LA Vol (A2C):   50.9 ml 27.43 ml/m  RA Volume:   66.30 ml  35.74 ml/m LA Vol (A4C):   73.2 ml 39.45 ml/m LA Biplane Vol: 63.8 ml 34.39 ml/m  AORTIC VALVE                     PULMONIC VALVE AV Area (Vmax):    2.20 cm      PV Vmax:       0.94 m/s AV Area (Vmean):   2.09 cm      PV Peak grad:  3.5 mmHg AV Area (VTI):     1.95 cm AV Vmax:           268.67 cm/s AV Vmean:          163.333 cm/s AV VTI:            0.576 m AV Peak Grad:      28.9 mmHg AV Mean Grad:      13.3 mmHg LVOT Vmax:         171.00 cm/s  LVOT Vmean:        98.500 cm/s LVOT VTI:          0.324  m LVOT/AV VTI ratio: 0.56 AI PHT:            362 msec  AORTA Ao Root diam: 3.00 cm MITRAL VALVE                TRICUSPID VALVE MV Area (PHT): 3.66 cm     TV Peak grad:   41.7 mmHg MV Decel Time: 207 msec     TV Vmax:        3.23 m/s MV E velocity: 122.00 cm/s MV A velocity: 89.40 cm/s   SHUNTS MV E/A ratio:  1.36         Systemic VTI:  0.32 m                             Systemic Diam: 2.10 cm Kate Sable MD Electronically signed by Kate Sable MD Signature Date/Time: 01/04/2021/1:36:54 PM    Final      Assessment/Plan: ?pt presenting with sob and cough for the past month- intially started with fever and was diagnosed as a viral resp infection a month ago at [place] Now has SOB on exertion, orthopnea PND   Enterococcus bacteremia- with symptoms of new onset CHF need to r/o endocarditis No hardware, no pacemaker or prosthetic valves Pt had 2 d echo- valves no vegetation TEE is scheduled tomorrow Source of enterococcus unclear ( it is a gi pathogen) UC sent Recommend post void bladder scan Also has altered bowel movts,mostly constipation As no diarrhea Cdiff order has been canceled and enteric precaution removed   Pt is on ampicillin and ceftriaxone Has had compression fracture thoracic spine and pain back and neck- MRI thoracic and cervical spine shows no discitis /infection though limited by no contrast     CHF- doubt he has pneumonia     AKI-    Anemia- ? hemooccult  Discussed the management with his wife and his nurse

## 2021-01-06 ENCOUNTER — Encounter
Admission: EM | Disposition: A | Discharge status: Skilled Nursing Facility | Payer: Self-pay | Source: Home / Self Care | Attending: Hospitalist

## 2021-01-06 ENCOUNTER — Encounter: Payer: Self-pay | Admitting: Anesthesiology

## 2021-01-06 ENCOUNTER — Inpatient Hospital Stay: Payer: No Typology Code available for payment source

## 2021-01-06 ENCOUNTER — Inpatient Hospital Stay (HOSPITAL_COMMUNITY)
Admit: 2021-01-06 | Discharge: 2021-01-06 | Disposition: A | Discharge status: Home / Self Care | Payer: No Typology Code available for payment source | Attending: Cardiovascular Disease | Admitting: Cardiovascular Disease

## 2021-01-06 DIAGNOSIS — B952 Enterococcus as the cause of diseases classified elsewhere: Secondary | ICD-10-CM | POA: Diagnosis not present

## 2021-01-06 DIAGNOSIS — I361 Nonrheumatic tricuspid (valve) insufficiency: Secondary | ICD-10-CM

## 2021-01-06 DIAGNOSIS — J9601 Acute respiratory failure with hypoxia: Secondary | ICD-10-CM | POA: Diagnosis not present

## 2021-01-06 DIAGNOSIS — I509 Heart failure, unspecified: Secondary | ICD-10-CM | POA: Diagnosis not present

## 2021-01-06 DIAGNOSIS — I351 Nonrheumatic aortic (valve) insufficiency: Secondary | ICD-10-CM

## 2021-01-06 DIAGNOSIS — N179 Acute kidney failure, unspecified: Secondary | ICD-10-CM | POA: Diagnosis not present

## 2021-01-06 DIAGNOSIS — R7881 Bacteremia: Secondary | ICD-10-CM

## 2021-01-06 DIAGNOSIS — I340 Nonrheumatic mitral (valve) insufficiency: Secondary | ICD-10-CM

## 2021-01-06 HISTORY — PX: TEE WITHOUT CARDIOVERSION: SHX5443

## 2021-01-06 LAB — LACTOFERRIN, FECAL, QUALITATIVE: Lactoferrin, Fecal, Qual: NEGATIVE

## 2021-01-06 LAB — CULTURE, BLOOD (ROUTINE X 2)
Special Requests: ADEQUATE
Special Requests: ADEQUATE

## 2021-01-06 LAB — URINE CULTURE: Culture: NO GROWTH

## 2021-01-06 LAB — OCCULT BLOOD X 1 CARD TO LAB, STOOL: Fecal Occult Bld: NEGATIVE

## 2021-01-06 IMAGING — CR DG CHEST 2V
2 series · 2 of 2 positions shown · non-contrast
Comparison: [DATE]

CLINICAL DATA: Shortness of breath

EXAM:
CHEST - 2 VIEW

[chest pa]
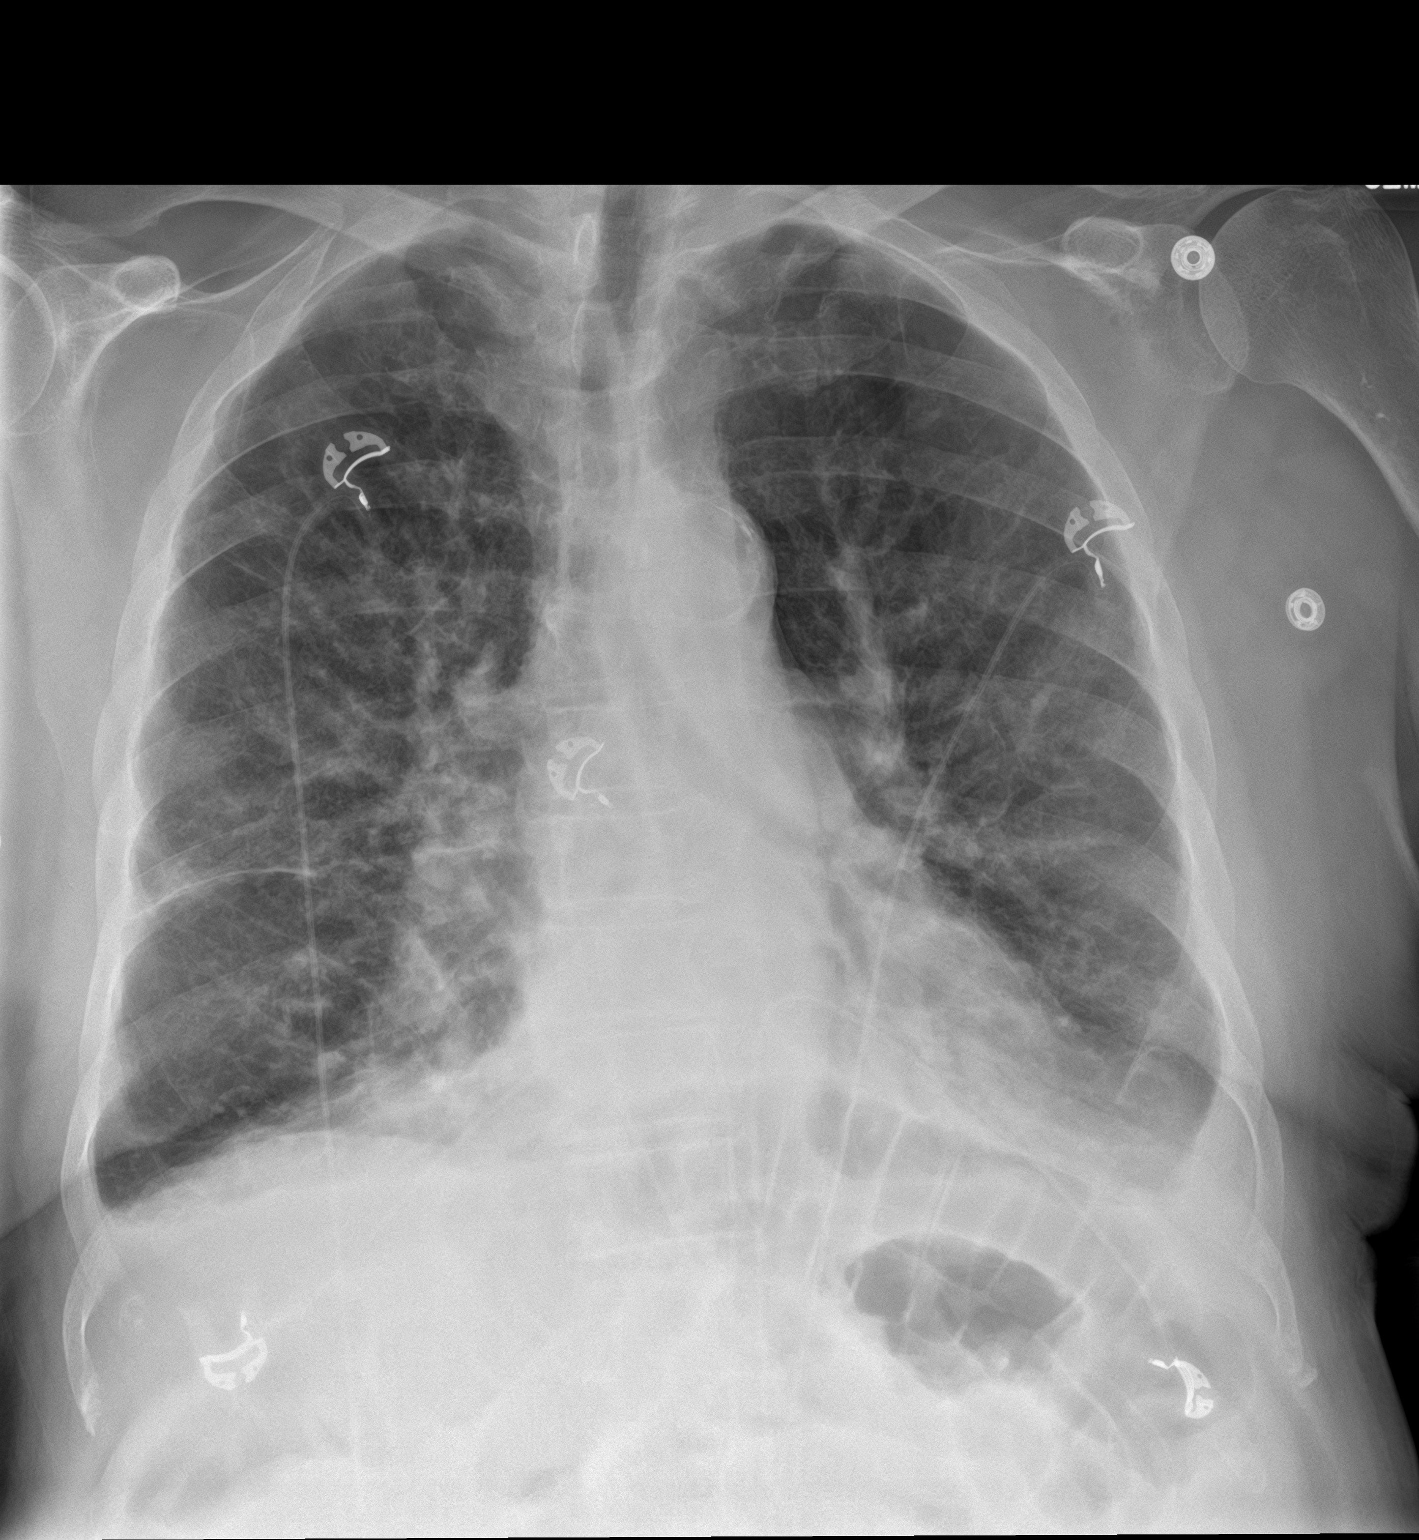

[chest lat]
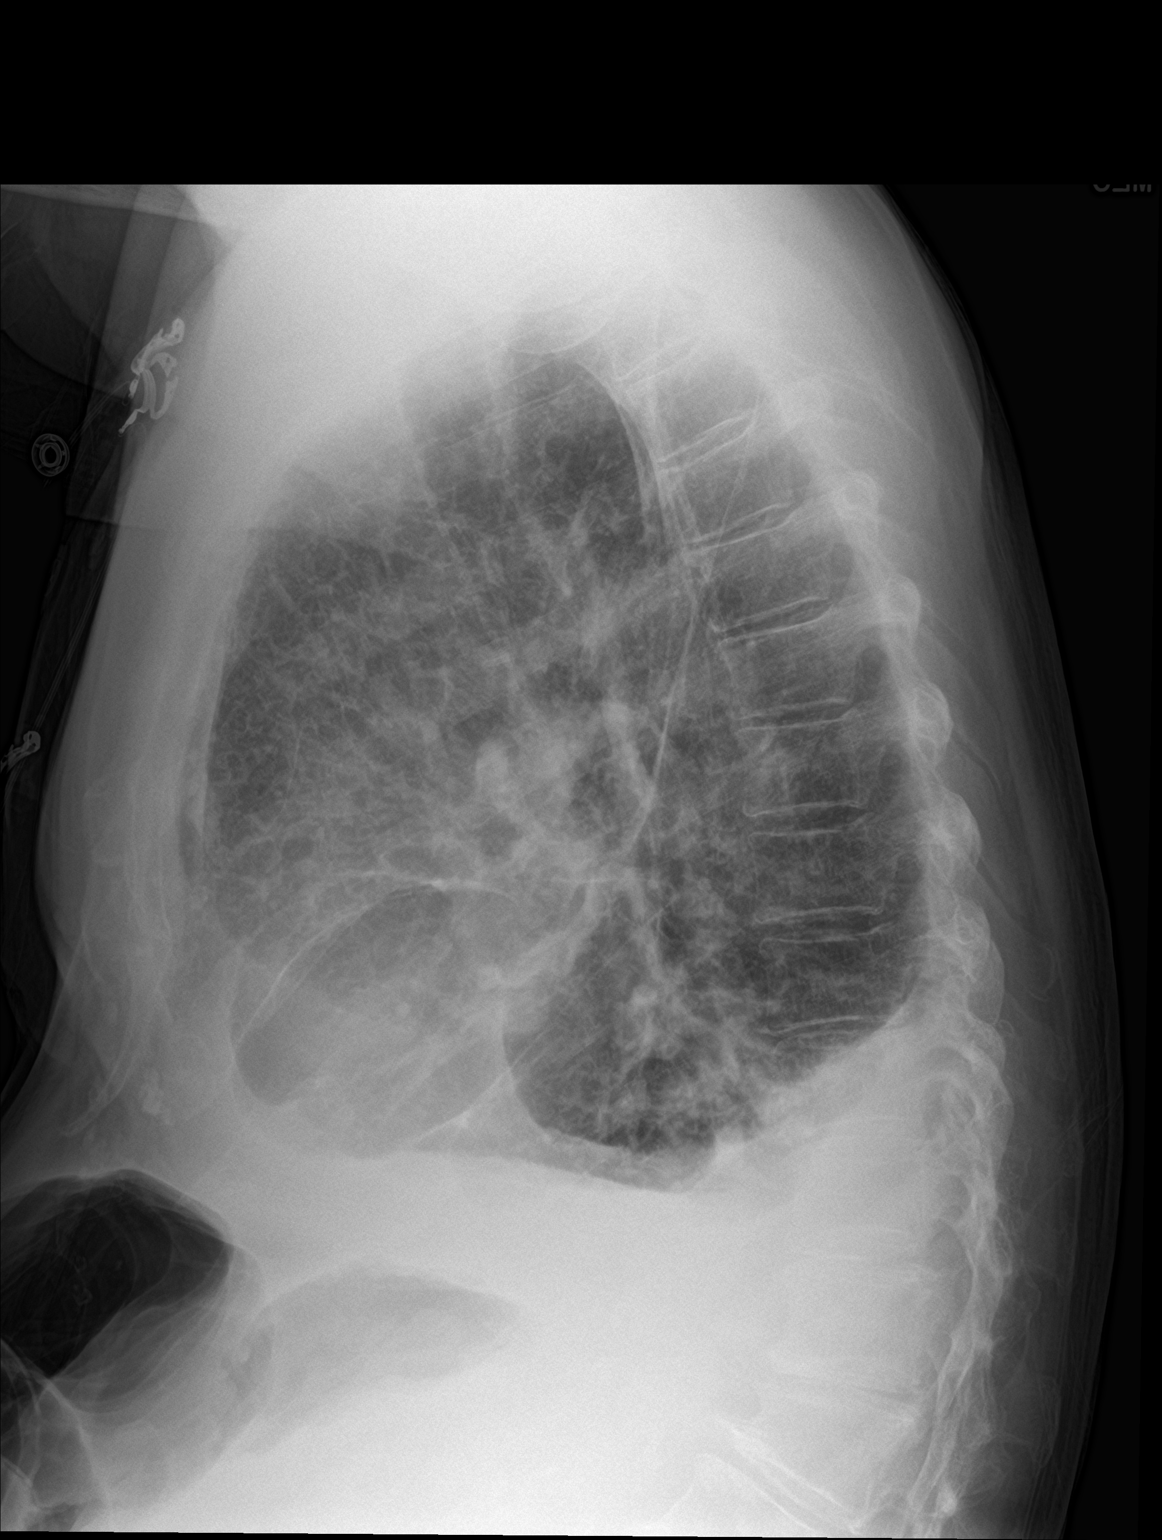

[2 of 2 positions shown; findings below may reference images not displayed]

FINDINGS: Small bilateral pleural effusions. Slight increased patchy airspace
disease at the bases. Stable cardiomediastinal silhouette with
aortic atherosclerosis. No pneumothorax.
IMPRESSION: 1. Small bilateral pleural effusions with increasing atelectasis or
infiltrates at the bases

## 2021-01-06 SURGERY — ECHOCARDIOGRAM, TRANSESOPHAGEAL
Anesthesia: Moderate Sedation

## 2021-01-06 MED ORDER — IPRATROPIUM-ALBUTEROL 0.5-2.5 (3) MG/3ML IN SOLN
RESPIRATORY_TRACT | Status: AC
  Filled 2021-01-06: qty 3

## 2021-01-06 MED ORDER — FENTANYL CITRATE (PF) 100 MCG/2ML IJ SOLN
INTRAMUSCULAR | Status: AC
  Filled 2021-01-06: qty 2

## 2021-01-06 MED ORDER — LIDOCAINE VISCOUS HCL 2 % MT SOLN
OROMUCOSAL | Status: AC
  Filled 2021-01-06: qty 15

## 2021-01-06 MED ORDER — FUROSEMIDE 20 MG PO TABS
20.0000 mg | ORAL_TABLET | Freq: Every day | ORAL | Status: DC
  Administered 2021-01-07: 20 mg via ORAL
  Filled 2021-01-06: qty 1

## 2021-01-06 MED ORDER — SODIUM CHLORIDE FLUSH 0.9 % IV SOLN
INTRAVENOUS | Status: AC
  Filled 2021-01-06: qty 10

## 2021-01-06 MED ORDER — MIDAZOLAM HCL 2 MG/2ML IJ SOLN
INTRAMUSCULAR | Status: AC
  Filled 2021-01-06: qty 2

## 2021-01-06 MED ORDER — BUTAMBEN-TETRACAINE-BENZOCAINE 2-2-14 % EX AERO
INHALATION_SPRAY | CUTANEOUS | Status: AC
  Filled 2021-01-06: qty 5

## 2021-01-06 MED ORDER — MIDAZOLAM HCL 2 MG/2ML IJ SOLN
INTRAMUSCULAR | Status: AC | PRN
  Administered 2021-01-06: 2 mg via INTRAVENOUS
  Administered 2021-01-06: 1 mg via INTRAVENOUS

## 2021-01-06 MED ORDER — FUROSEMIDE 10 MG/ML IJ SOLN
40.0000 mg | Freq: Once | INTRAMUSCULAR | Status: AC
  Administered 2021-01-06: 40 mg via INTRAVENOUS
  Filled 2021-01-06: qty 4

## 2021-01-06 MED ORDER — SODIUM CHLORIDE 0.9 % IV SOLN
2.0000 g | Freq: Two times a day (BID) | INTRAVENOUS | Status: DC
  Administered 2021-01-06 – 2021-01-07 (×2): 2 g via INTRAVENOUS
  Filled 2021-01-06 (×2): qty 20
  Filled 2021-01-06 (×2): qty 2

## 2021-01-06 MED ORDER — FENTANYL CITRATE (PF) 100 MCG/2ML IJ SOLN
INTRAMUSCULAR | Status: AC | PRN
  Administered 2021-01-06: 25 ug via INTRAVENOUS
  Administered 2021-01-06: 50 ug via INTRAVENOUS

## 2021-01-06 NOTE — Progress Notes (Signed)
PROGRESS NOTE    Phillip Chavez  QMV:[id number] DOB: [birth year] DOA: 01/03/2021 PCP: [facility]   Brief Narrative: Taken from H&P. Phillip Chavez is a 80 y.o. male with medical history significant of hypertension, hyperlipidemia, COPD not on oxygen at home, GERD, recently diagnosed CHF in [place], who presents with shortness of breath.   Patient states that he has been having upper respiratory symptoms of for almost a month. He was initially seen at the [place] and diagnosed with viral upper respiratory tract infection.  This lasted for about 10 days and he seemed to get somewhat better but then symptoms returned in past week.  He has shortness of breath, productive cough with greenish colored sputum production. Patient has fever and chills.  Patient also has worsening bilateral lower extremity edema and orthopnea.  Per patient he was recently diagnosed with CHF but no echo done yet.  He was also hypoxic at 89% on arrival to ED which improved to high 90s on 2 L of oxygen. He met sepsis criteria with temperature of 100.4, leukocytosis and chest x-ray concerning for right middle lobe infiltrate and streaky bilateral basilar opacities.  AKI with creatinine of 1.64, no recent baseline, prior creatinine in the system was 1.17 in December 2014. Preliminary blood cultures with Enterococcus faecalis. Initially started on Levaquin which were switched to Unasyn . Echo with normal EF, no regional wall motion abnormalities, normal diastolic function and mild aortic stenosis and no vegetations noted.  ID was consulted for Enterococcus bacteremia, they added ceftriaxone empirically for endocarditis until patient had TEE to rule it out completely.  Cardiology consulted for TEE.  Patient was also having neck and back pain, MRI of cervical, thoracic and lumbar spine ordered to rule out any discitis or osteomyelitis, unable to complete MRI of lumbar spine, pain is mostly in neck and upper back, negative for any  osteomyelitis or discitis but did show some foraminal narrowing at cervical level which can be taken care as an outpatient. Going for TEE today.  Oxygen requirement increased later today, we were holding home dose of Lasix for concern of sepsis.  Ordered a repeat chest x-ray and giving 1 dose of IV Lasix, also resuming home Lasix from tomorrow.  Subjective: Patient was having some shortness of breath and feeling weak when seen today. Later notified that oxygen requirement increased to 5 L.  Assessment & Plan:   Principal Problem:   Acute respiratory failure with hypoxia (HCC) Active Problems:   CAP (community acquired pneumonia)   Sepsis ([place])   COPD (chronic obstructive pulmonary disease) (HCC)   GERD (gastroesophageal reflux disease)   HTN (hypertension)   HLD (hyperlipidemia)   CHF exacerbation (HCC)   Elevated troponin   AKI (acute kidney injury) ([place])   Normocytic anemia  Sepsis secondary to CAP and Enterococcus faecalis bacteremia.  Met sepsis criteria with fever, leukocytosis and concern of pneumonia, later blood cultures came back positive for Enterococcus faecalis.  Patient received 1 dose of vancomycin and Rocephin in ED and then started on Levaquin. Levaquin was switched with Unasyn  based on preliminary blood cultures.  Azithromycin allergies are also listed.  ID added ceftriaxone empirically for endocarditis, we can discontinue once TEE normal. Patient was also having some upper back and neck pain-MRI of cervical, thoracic spine was negative for any osteomyelitis or discitis although some limitation due to no contrast.  It did show foraminal narrowing and some stenosis at cervical level which can be followed up as an  outpatient. Unasyn was switched with ampicillin by ID yesterday. Preliminary repeat blood cultures negative so far.  Going for TEE today. -Continue with ampicillin -Continue with ceftriaxone -Follow-up on TEE results. -Continue with supportive care -Try  weaning from oxygen.  Acute respiratory failure with hypoxia secondary to pneumonia and CHF exacerbation.  Patient was hypoxic in high 80s on room air requiring up to 2 L of oxygen.  No baseline oxygen requirement.  JVD elevated, bilateral lower extremity edema and BNP at 473 with crackles on auscultation on admission which met criteria with CHF exacerbation.  Echocardiogram within normal limits with mild aortic stenosis. -Try weaning from oxygen as tolerated  CHF exacerbation.  Met criteria for CHF exacerbation as described above.  Received 1 dose of IV Lasix, which was not continued due to concern of sepsis. Echocardiogram with normal EF and indeterminate diastolic parameters, mild aortic stenosis. Repeating chest x-ray and giving 1 dose of IV Lasix due to worsening oxygen requirement. -Restart home dose of Lasix at 20 mg daily from tomorrow. -Daily BMP and weight -Strict intake and output  Elevated troponin.  Peaked at 147 and now trending down.  Likely secondary to demand ischemia with CHF exacerbation.  No chest pain. -Continue with aspirin and Crestor.  COPD.  No wheezing on admission. -Continue with bronchodilators  Hypertension.  Blood pressure within goal. -Keep holding home dose of Cozaar for concern of AKI. -IV hydralazine as needed  AKI (acute kidney injury) (HCC): No recent baseline creatinine available.  Patient had creatinine 1.17 on 02/01/2013.  It was 1.64 on admission and now started trending down to 1.44>1.48 Renal ultrasound without any abnormality, simple renal cyst.  Might be his new baseline. -Monitor renal function -Avoid nephrotoxins  Normocytic anemia: Hemoglobin 10.6>>9.7.  Anemia panel with anemia of chronic disease and some iron deficiency.  FOBT negative. -Start him on iron supplement -Follow-up with CBC  GERD (gastroesophageal reflux disease) -Protonix  HLD (hyperlipidemia) -Crestor  Generalized weakness. -PT/OT evaluation  Objective: Vitals:    01/06/21 1339 01/06/21 1340 01/06/21 1345 01/06/21 1350  BP: (!) 157/40 (!) 149/38 (!) 132/47 116/89  Pulse: 96 91 78 75  Resp: 19 (!) 22 (!) 23 (!) 22  Temp:      TempSrc:      SpO2: 95% 93% 90% 92%  Weight:      Height:        Intake/Output Summary (Last 24 hours) at 01/06/2021 1356 Last data filed at 01/06/2021 0354 Gross per 24 hour  Intake 777.26 ml  Output 150 ml  Net 627.26 ml    Filed Weights   01/03/21 0603 01/06/21 0333  Weight: 78 kg 79.9 kg    Examination:  General.  Ill-appearing gentleman, in no acute distress. Pulmonary.  Lungs clear bilaterally, normal respiratory effort. CV.  Regular rate and rhythm, no JVD, rub or murmur. Abdomen.  Soft, nontender, nondistended, BS positive. CNS.  Alert and oriented .  No focal neurologic deficit. Extremities.  No edema, no cyanosis, pulses intact and symmetrical. Psychiatry.  Judgment and insight appears normal.   DVT prophylaxis: Lovenox Code Status: Full Family Communication: Discussed with wife at bedside. Disposition Plan:  Status is: Inpatient  Remains inpatient appropriate because:  of the severity of illness  Level of care: Progressive Cardiac  All the records are reviewed and case discussed with Care Management/Social Worker. Management plans discussed with the patient, nursing and they are in agreement.  Consultants:  ID  Procedures:  TEE  Antimicrobials:  Ampicillin Ceftriaxone  Data Reviewed: I have personally reviewed following labs and imaging studies  CBC: Recent Labs  Lab 01/03/21 0605 01/05/21 0522  WBC 12.1* 8.1  NEUTROABS 9.3*  --   HGB 10.6* 9.7*  HCT 32.2* 29.4*  MCV 86.8 88.3  PLT 245 570    Basic Metabolic Panel: Recent Labs  Lab 01/03/21 0605 01/03/21 1046 01/04/21 0544 01/05/21 0522  NA 130* 131* 133* 133*  K 4.3 3.8 4.3 4.1  CL 99 100 104 103  CO2 24 22 21* 22  GLUCOSE 119* 132* 111* 194*  BUN $Re'17 20 20 17  'INf$ CREATININE 1.54* 1.64* 1.44* 1.48*  CALCIUM 8.2*  8.0* 8.0* 8.1*  MG  --   --  2.3  --   PHOS  --   --   --  3.7    GFR: Estimated Creatinine Clearance: 37.5 mL/min (A) (by C-G formula based on SCr of 1.48 mg/dL (H)). Liver Function Tests: Recent Labs  Lab 01/03/21 1046 01/05/21 0522  AST 66*  --   ALT 85*  --   ALKPHOS 87  --   BILITOT 1.3*  --   PROT 6.7  --   ALBUMIN 2.5* 2.3*    No results for input(s): LIPASE, AMYLASE in the last 168 hours. No results for input(s): AMMONIA in the last 168 hours. Coagulation Profile: No results for input(s): INR, PROTIME in the last 168 hours. Cardiac Enzymes: No results for input(s): CKTOTAL, CKMB, CKMBINDEX, TROPONINI in the last 168 hours. BNP (last 3 results) No results for input(s): PROBNP in the last 8760 hours. HbA1C: Recent Labs    01/05/21 0522  HGBA1C 6.1*   CBG: No results for input(s): GLUCAP in the last 168 hours. Lipid Profile: Recent Labs    01/04/21 0544  CHOL 123  HDL 13*  LDLCALC 86  TRIG 120  CHOLHDL 9.5    Thyroid Function Tests: No results for input(s): TSH, T4TOTAL, FREET4, T3FREE, THYROIDAB in the last 72 hours. Anemia Panel: Recent Labs    01/04/21 0544 01/04/21 2051  VITAMINB12  --  382  FOLATE 27.0  --   FERRITIN 288  --   TIBC 225*  --   IRON 18*  --   RETICCTPCT  --  1.6    Sepsis Labs: Recent Labs  Lab 01/03/21 0917 01/03/21 1046  PROCALCITON  --  0.18  LATICACIDVEN 1.0  --      Recent Results (from the past 240 hour(s))  Resp Panel by RT-PCR (Flu A&B, Covid) Nasopharyngeal Swab     Status: None   Collection Time: 01/03/21  6:06 AM   Specimen: Nasopharyngeal Swab; Nasopharyngeal(NP) swabs in vial transport medium  Result Value Ref Range Status   SARS Coronavirus 2 by RT PCR NEGATIVE NEGATIVE Final    Comment: (NOTE) SARS-CoV-2 target nucleic acids are NOT DETECTED.  The SARS-CoV-2 RNA is generally detectable in upper respiratory specimens during the acute phase of infection. The lowest concentration of SARS-CoV-2  viral copies this assay can detect is 138 copies/mL. A negative result does not preclude SARS-Cov-2 infection and should not be used as the sole basis for treatment or other patient management decisions. A negative result may occur with  improper specimen collection/handling, submission of specimen other than nasopharyngeal swab, presence of viral mutation(s) within the areas targeted by this assay, and inadequate number of viral copies(<138 copies/mL). A negative result must be combined with clinical observations, patient history, and epidemiological information. The expected result is Negative.  Fact Sheet for Patients:  EntrepreneurPulse.com.au  Fact Sheet for Healthcare Providers:  IncredibleEmployment.be  This test is no t yet approved or cleared by the [place] FDA and  has been authorized for detection and/or diagnosis of SARS-CoV-2 by FDA under an Emergency Use Authorization (EUA). This EUA will remain  in effect (meaning this test can be used) for the duration of the COVID-19 declaration under Section 564(b)(1) of the Act, 21 U.S.C.section 360bbb-3(b)(1), unless the authorization is terminated  or revoked sooner.       Influenza A by PCR NEGATIVE NEGATIVE Final   Influenza B by PCR NEGATIVE NEGATIVE Final    Comment: (NOTE) The Xpert Xpress SARS-CoV-2/FLU/RSV plus assay is intended as an aid in the diagnosis of influenza from Nasopharyngeal swab specimens and should not be used as a sole basis for treatment. Nasal washings and aspirates are unacceptable for Xpert Xpress SARS-CoV-2/FLU/RSV testing.  Fact Sheet for Patients: EntrepreneurPulse.com.au  Fact Sheet for Healthcare Providers: IncredibleEmployment.be  This test is not yet approved or cleared by the [place] FDA and has been authorized for detection and/or diagnosis of SARS-CoV-2 by FDA under an Emergency Use Authorization  (EUA). This EUA will remain in effect (meaning this test can be used) for the duration of the COVID-19 declaration under Section 564(b)(1) of the Act, 21 U.S.C. section 360bbb-3(b)(1), unless the authorization is terminated or revoked.  Performed at [facility], [address] [place], [place] [address]   Blood culture (routine x 2)     Status: Abnormal   Collection Time: 01/03/21  9:17 AM   Specimen: BLOOD  Result Value Ref Range Status   Specimen Description   Final    BLOOD LEFT ANTECUBITAL Performed at [facility], [address], [place], [place] [address]    Special Requests   Final    BOTTLES DRAWN AEROBIC AND ANAEROBIC Blood Culture adequate volume Performed at [facility], [address] [place], [place] [address]    Culture  Setup Time   Final    GRAM POSITIVE COCCI IN BOTH AEROBIC AND ANAEROBIC BOTTLES CRITICAL RESULT CALLED TO, READ BACK BY AND VERIFIED WITH: NATHAN BELUE AT 3254 01/04/21.PMF Performed at [facility], [address], [place], [place] [address]    Culture (A)  Final    ENTEROCOCCUS FAECALIS SUSCEPTIBILITIES PERFORMED ON PREVIOUS CULTURE WITHIN THE LAST 5 DAYS. Performed at [facility], [place] [address], [place], [place] [address]    Report Status 01/06/2021 FINAL  Final  Blood culture (routine x 2)     Status: Abnormal   Collection Time: 01/03/21  9:17 AM   Specimen: BLOOD LEFT HAND  Result Value Ref Range Status   Specimen Description   Final    BLOOD LEFT HAND Performed at [facility], [address] [place], [place] [address]    Special Requests   Final    BOTTLES DRAWN AEROBIC AND ANAEROBIC Blood Culture adequate volume Performed at [facility], [address] [place], [place] [address]    Culture  Setup Time   Final    Organism ID to follow GRAM POSITIVE COCCI IN BOTH AEROBIC AND ANAEROBIC BOTTLES CRITICAL RESULT CALLED TO, READ BACK BY AND VERIFIED WITH: NATHAN  BELUE AT 1031 01/04/21.PMF Performed at [facility], [address], [place], [place] [address]    Culture ENTEROCOCCUS FAECALIS (A)  Final   Report Status 01/06/2021 FINAL  Final   Organism ID, Bacteria ENTEROCOCCUS FAECALIS  Final  Susceptibility   Enterococcus faecalis - MIC*    AMPICILLIN <=2 SENSITIVE Sensitive     VANCOMYCIN 1 SENSITIVE Sensitive     GENTAMICIN SYNERGY SENSITIVE Sensitive     * ENTEROCOCCUS FAECALIS  Blood Culture ID Panel (Reflexed)     Status: Abnormal   Collection Time: 01/03/21  9:17 AM  Result Value Ref Range Status   Enterococcus faecalis DETECTED (A) NOT DETECTED Final    Comment: CRITICAL RESULT CALLED TO, READ BACK BY AND VERIFIED WITH: NATHAN BELUE AT 6045 01/04/21.PMF    Enterococcus Faecium NOT DETECTED NOT DETECTED Final   Listeria monocytogenes NOT DETECTED NOT DETECTED Final   Staphylococcus species NOT DETECTED NOT DETECTED Final   Staphylococcus aureus (BCID) NOT DETECTED NOT DETECTED Final   Staphylococcus epidermidis NOT DETECTED NOT DETECTED Final   Staphylococcus lugdunensis NOT DETECTED NOT DETECTED Final   Streptococcus species NOT DETECTED NOT DETECTED Final   Streptococcus agalactiae NOT DETECTED NOT DETECTED Final   Streptococcus pneumoniae NOT DETECTED NOT DETECTED Final   Streptococcus pyogenes NOT DETECTED NOT DETECTED Final   A.calcoaceticus-baumannii NOT DETECTED NOT DETECTED Final   Bacteroides fragilis NOT DETECTED NOT DETECTED Final   Enterobacterales NOT DETECTED NOT DETECTED Final   Enterobacter cloacae complex NOT DETECTED NOT DETECTED Final   Escherichia coli NOT DETECTED NOT DETECTED Final   Klebsiella aerogenes NOT DETECTED NOT DETECTED Final   Klebsiella oxytoca NOT DETECTED NOT DETECTED Final   Klebsiella pneumoniae NOT DETECTED NOT DETECTED Final   Proteus species NOT DETECTED NOT DETECTED Final   Salmonella species NOT DETECTED NOT DETECTED Final   Serratia marcescens NOT DETECTED NOT DETECTED Final    Haemophilus influenzae NOT DETECTED NOT DETECTED Final   Neisseria meningitidis NOT DETECTED NOT DETECTED Final   Pseudomonas aeruginosa NOT DETECTED NOT DETECTED Final   Stenotrophomonas maltophilia NOT DETECTED NOT DETECTED Final   Candida albicans NOT DETECTED NOT DETECTED Final   Candida auris NOT DETECTED NOT DETECTED Final   Candida glabrata NOT DETECTED NOT DETECTED Final   Candida krusei NOT DETECTED NOT DETECTED Final   Candida parapsilosis NOT DETECTED NOT DETECTED Final   Candida tropicalis NOT DETECTED NOT DETECTED Final   Cryptococcus neoformans/gattii NOT DETECTED NOT DETECTED Final   Vancomycin resistance NOT DETECTED NOT DETECTED Final    Comment: Performed at [facility], [address], [place], [place] [address]  Urine Culture     Status: None   Collection Time: 01/04/21  5:21 PM   Specimen: Urine, Clean Catch  Result Value Ref Range Status   Specimen Description   Final    URINE, CLEAN CATCH Performed at [facility], [address], [place], [place] [address]    Special Requests   Final    NONE Performed at [facility], [address], [place], [place] [address]    Culture   Final    NO GROWTH Performed at [facility], [place] [address], [place], [place] [address]    Report Status 01/06/2021 FINAL  Final  CULTURE, BLOOD (ROUTINE X 2) w Reflex to ID Panel     Status: None (Preliminary result)   Collection Time: 01/06/21  5:28 AM   Specimen: BLOOD  Result Value Ref Range Status   Specimen Description BLOOD LEFT FA  Final   Special Requests   Final    BOTTLES DRAWN AEROBIC AND ANAEROBIC Blood Culture adequate volume   Culture   Final    NO GROWTH <12 HOURS Performed at [facility], [address] [place], [place] [address]    Report Status PENDING  Incomplete  CULTURE, BLOOD (ROUTINE X 2) w Reflex to ID Panel     Status: None (Preliminary result)   Collection Time: 01/06/21  5:29 AM   Specimen: BLOOD   Result Value Ref Range Status   Specimen Description BLOOD LEFT HAND  Final   Special Requests   Final    BOTTLES DRAWN AEROBIC AND ANAEROBIC Blood Culture adequate volume   Culture   Final    NO GROWTH <12 HOURS Performed at [facility], [address], [place], [place] [address]    Report Status PENDING  Incomplete      Radiology Studies: MR CERVICAL SPINE WO CONTRAST  Result Date: 01/05/2021 CLINICAL DATA:  Enterococcus bacteremia, neck pain, infection suspected EXAM: MRI CERVICAL AND THORACIC SPINE WITHOUT CONTRAST TECHNIQUE: Multiplanar and multiecho pulse sequences of the cervical and thoracic spine were obtained without intravenous contrast. COMPARISON:  None. FINDINGS: Evaluation is somewhat limited by motion artifact. MRI CERVICAL SPINE FINDINGS Alignment: Physiologic. Vertebrae: No acute fracture or suspicious lesion. Evaluation for osteomyelitis is somewhat limited in the absence of intravenous contrast; however no increased T2 signal is seen in the endplates or intervertebral discs. 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 Cord: Normal signal and morphology. Posterior Fossa, vertebral arteries, paraspinal tissues: Negative. Disc levels: C2-C3: No significant disc bulge. Facet arthropathy. No spinal canal stenosis. Mild bilateral neural foraminal narrowing. C3-C4: Small left foraminal disc protrusion. Facet and uncovertebral hypertrophy. Severe left and moderate right neural foraminal narrowing. No spinal canal stenosis C4-C5: Moderate disc bulge. Facet arthropathy. Moderate spinal canal stenosis. Moderate bilateral neural foraminal narrowing. C5-C6: No significant disc bulge. Facet and uncovertebral hypertrophy. No spinal canal stenosis. Moderate bilateral neural foraminal narrowing. C6-C7: No significant disc bulge. Facet arthropathy. No spinal canal stenosis. Mild bilateral neural foraminal narrowing. C7-T1: No significant disc bulge. No  spinal canal stenosis or neuroforaminal narrowing. MRI THORACIC SPINE FINDINGS Alignment:  Physiologic. Vertebrae: No acute fracture. Endplate degenerative changes T4-T5 without increased signal within the disc, favored to be degenerative. There is additional increased T2 signal at T6-T7, with increased T1 signal at the superior endplate, likely combination of Modic type 1 and type 2 degenerative changes, with increased T2 signal within the intervertebral disc (series 21, image 11), favored to be reactive. Cord:  Normal signal and morphology. Paraspinal and other soft tissues: No paravertebral collection. Disc levels: Facet arthropathy in the lower thoracic spine, causes mild osseous canal narrowing without significant spinal canal stenosis. Moderate left neural foraminal narrowing at T9-T10 and moderate right neural foraminal narrowing at T9-T10 and T10-T11. Partially visualized lumbar spine demonstrates a chronic appearing compression deformity of L1. IMPRESSION: 1. No definite evidence of osteomyelitis discitis, although evaluation is somewhat limited by the absence of intravenous contrast. 2. C4-C5 moderate spinal canal stenosis and moderate bilateral neural foraminal narrowing. 3. C3-C4 severe left and moderate right neural foraminal narrowing. 4. C5-C6 moderate bilateral neural foraminal narrowing. Electronically Signed   By: Merilyn Baba M.D.   On: 01/05/2021 17:43   MR THORACIC SPINE WO CONTRAST  Result Date: 01/05/2021 CLINICAL DATA:  Enterococcus bacteremia, neck pain, infection suspected EXAM: MRI CERVICAL AND THORACIC SPINE WITHOUT CONTRAST TECHNIQUE: Multiplanar and multiecho pulse sequences of the cervical and thoracic spine were obtained without intravenous contrast. COMPARISON:  None. FINDINGS: Evaluation is somewhat limited by motion artifact. MRI CERVICAL SPINE FINDINGS Alignment: Physiologic. Vertebrae: No acute fracture or suspicious  lesion. Evaluation for osteomyelitis is somewhat limited  in the absence of intravenous contrast; however no increased T2 signal is seen in the endplates or intervertebral discs. 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 Cord: Normal signal and morphology. Posterior Fossa, vertebral arteries, paraspinal tissues: Negative. Disc levels: C2-C3: No significant disc bulge. Facet arthropathy. No spinal canal stenosis. Mild bilateral neural foraminal narrowing. C3-C4: Small left foraminal disc protrusion. Facet and uncovertebral hypertrophy. Severe left and moderate right neural foraminal narrowing. No spinal canal stenosis C4-C5: Moderate disc bulge. Facet arthropathy. Moderate spinal canal stenosis. Moderate bilateral neural foraminal narrowing. C5-C6: No significant disc bulge. Facet and uncovertebral hypertrophy. No spinal canal stenosis. Moderate bilateral neural foraminal narrowing. C6-C7: No significant disc bulge. Facet arthropathy. No spinal canal stenosis. Mild bilateral neural foraminal narrowing. C7-T1: No significant disc bulge. No spinal canal stenosis or neuroforaminal narrowing. MRI THORACIC SPINE FINDINGS Alignment:  Physiologic. Vertebrae: No acute fracture. Endplate degenerative changes T4-T5 without increased signal within the disc, favored to be degenerative. There is additional increased T2 signal at T6-T7, with increased T1 signal at the superior endplate, likely combination of Modic type 1 and type 2 degenerative changes, with increased T2 signal within the intervertebral disc (series 21, image 11), favored to be reactive. Cord:  Normal signal and morphology. Paraspinal and other soft tissues: No paravertebral collection. Disc levels: Facet arthropathy in the lower thoracic spine, causes mild osseous canal narrowing without significant spinal canal stenosis. Moderate left neural foraminal narrowing at T9-T10 and moderate right neural foraminal narrowing at T9-T10 and T10-T11. Partially visualized lumbar spine  demonstrates a chronic appearing compression deformity of L1. IMPRESSION: 1. No definite evidence of osteomyelitis discitis, although evaluation is somewhat limited by the absence of intravenous contrast. 2. C4-C5 moderate spinal canal stenosis and moderate bilateral neural foraminal narrowing. 3. C3-C4 severe left and moderate right neural foraminal narrowing. 4. C5-C6 moderate bilateral neural foraminal narrowing. Electronically Signed   By: Merilyn Baba M.D.   On: 01/05/2021 17:43    Scheduled Meds:  aspirin EC  81 mg Oral Daily   butamben-tetracaine-benzocaine       cholecalciferol  1,000 Units Oral Daily   enoxaparin (LOVENOX) injection  40 mg Subcutaneous QHS   fentaNYL       ferrous sulfate  325 mg Oral Q breakfast   furosemide  40 mg Intravenous Once   [START ON 01/07/2021] furosemide  20 mg Oral Daily   ipratropium-albuterol  3 mL Nebulization Q6H   lidocaine       midazolam       midazolam       multivitamin with minerals  1 tablet Oral Daily   omega-3 acid ethyl esters  1 g Oral Daily   pantoprazole  40 mg Oral Daily   rosuvastatin  40 mg Oral QHS   sodium chloride flush       sodium chloride flush       Continuous Infusions:  sodium chloride Stopped (01/05/21 1432)   sodium chloride Stopped (01/06/21 1309)   ampicillin (OMNIPEN) IV 2 g (01/06/21 1220)   cefTRIAXone (ROCEPHIN)  IV 2 g (01/06/21 0951)     LOS: 3 days   Time spent: 42 minutes. More than 50% of the time was spent in counseling/coordination of care  Lorella Nimrod, MD [facility]  If 7PM-7AM, please contact night-coverage [email or web address]  01/06/2021, 1:56 PM   This record has been created using Systems analyst. Errors have been sought and corrected,but may  not always be located. Such creation errors do not reflect on the standard of care.

## 2021-01-06 NOTE — Progress Notes (Signed)
Physical Therapy Treatment Patient Details Name: Phillip Chavez MRN: [id number] DOB: 1940/03/10 Today's Date: 01/06/2021   History of Present Illness Phillip Chavez is a n 68yoM coming [facility] ED [facility]/03/21 c 3d SOB, LEE, 89% on room air. Recent diagnosis of CHF PTA. PMH: CHF, HTN, COPD. Pt recent treated for URI. Workup suggesetive of RML CAP. HS tropnonin elevated, attributed to demand/supply mismatch.    PT Comments    Pt received in bed, wife at bedside. 96% at rest on 2L O2, HR 95bpm, pt without c/o SOB.  Pt transferred to EOB and sat for 2 minutes on RA, O2 sats dropped to 90%. Pt ambulated slowly to bathroom to sit on commode with c/o SOB after 1 minute on RA. O2 sats 84%  HR 118bpm. Pt ambulated back to bed, O2 sats declined again to 81%. Pt placed on 3L O2 in order to recover to 94% after 1 minute.  Pt desats quickly on room air, unable to stand and was hands without desating to 85% with c/o SOB. Will continue to assess O2 needs. Pt is currently very limited to short distances and minimal exertion activities.    Recommendations for follow up therapy are one component of a multi-disciplinary discharge planning process, led by the attending physician.  Recommendations may be updated based on patient status, additional functional criteria and insurance authorization.  Follow Up Recommendations  Home health PT     Assistance Recommended at Discharge Set up Supervision/Assistance  Equipment Recommendations  None recommended by PT    Recommendations for Other Services       Precautions / Restrictions Precautions Precautions: Fall Restrictions Weight Bearing Restrictions: No     Mobility  Bed Mobility Overal bed mobility: Modified Independent             General bed mobility comments:  (verbal cues to not hold his breath while transferring)    Transfers Overall transfer level: Modified independent Equipment used: Rolling walker (2 wheels)               General  transfer comment:  (vc's for pacing)    Ambulation/Gait Ambulation/Gait assistance: Supervision;Min guard Gait Distance (Feet): 25 Feet Assistive device: Rolling walker (2 wheels) Gait Pattern/deviations: Step-through pattern;Shuffle Gait velocity:  (decreased)     General Gait Details: Pt ambulated to the bathroom on RA, while on commode dyspnea progressed   Stairs             Wheelchair Mobility    Modified Rankin (Stroke Patients Only)       Balance                                            Cognition Arousal/Alertness: Awake/alert Behavior During Therapy: WFL for tasks assessed/performed Overall Cognitive Status: Within Functional Limits for tasks assessed                                 General Comments:  (Pt emotional about his current situation)        Exercises      General Comments General comments (skin integrity, edema, etc.):  (Pt and wife educated on energy conservation, use of O2, saturation levels, and importance of pacing.)      Pertinent Vitals/Pain Pain Assessment: No/denies pain    Home Living  Prior Function            PT Goals (current goals can now be found in the care plan section) Acute Rehab PT Goals Patient Stated Goal:  (Get home) Progress towards PT goals: Progressing toward goals    Frequency    Min 2X/week      PT Plan Current plan remains appropriate    Co-evaluation              AM-PAC PT "6 Clicks" Mobility   Outcome Measure  Help needed turning from your back to your side while in a flat bed without using bedrails?: None Help needed moving from lying on your back to sitting on the side of a flat bed without using bedrails?: None Help needed moving to and from a bed to a chair (including a wheelchair)?: A Little Help needed standing up from a chair using your arms (e.g., wheelchair or bedside chair)?: A Little Help needed to walk  in hospital room?: A Little Help needed climbing 3-5 steps with a railing? : A Little 6 Click Score: 20    End of Session Equipment Utilized During Treatment: Oxygen Activity Tolerance: Patient tolerated treatment well;Patient limited by fatigue Patient left: in bed;with call bell/phone within reach;with family/visitor present Nurse Communication: Mobility status PT Visit Diagnosis: Difficulty in walking, not elsewhere classified (R26.2);Unsteadiness on feet (R26.81)     Time: 1200-1240 PT Time Calculation (min) (ACUTE ONLY): 40 min  Charges:  $Gait Training: 8-22 mins $Therapeutic Exercise: 8-22 mins $Therapeutic Activity: 8-22 mins                    Phillip Chavez, PTA    Phillip Chavez 01/06/2021, 2:08 PM

## 2021-01-06 NOTE — Progress Notes (Signed)
Pt had 1 large watery stool, MD [facility] consulted. Specimen collected and sent to lab. Per MD Hal Hope- will follow up with ID to re-evaluate need to c-diff rule out/ orders to place on contact:enteric precautions. Family informed about new order.   No orders to test specimen at this time. MD Hal Hope will relay information to ID in the morning

## 2021-01-06 NOTE — Progress Notes (Signed)
*  PRELIMINARY RESULTS* Echocardiogram Echocardiogram Transesophageal has been performed.  Sherrie Sport 01/06/2021, 2:24 PM

## 2021-01-06 NOTE — Progress Notes (Signed)
Pt placed onto bedpan by this RN. 1 medium unmeasured liquid BM.

## 2021-01-06 NOTE — Progress Notes (Signed)
Pt placed onto bedpan at this time by NT Shawn. 1 unmeasured liquid BM.

## 2021-01-06 NOTE — Progress Notes (Addendum)
Pt had 1 small loose watery stool. Pt Continent and was placed on the bedpan.

## 2021-01-06 NOTE — Progress Notes (Signed)
Date of Admission:  01/03/2021    ID: Phillip Chavez is a 80 y.o. male  Principal Problem:   Acute respiratory failure with hypoxia ([place]) Active Problems:   CAP (community acquired pneumonia)   Sepsis ([place])   COPD (chronic obstructive pulmonary disease) (HCC)   GERD (gastroesophageal reflux disease)   HTN (hypertension)   HLD (hyperlipidemia)   CHF exacerbation (HCC)   Elevated troponin   AKI (acute kidney injury) ([place])   Normocytic anemia  Medical history from [place] record HTN w/ Heart Involvement CAD * (ICD-9-CM 414.9) Low Back Pain * (ICD-9-CM 724.2) Hyperlipidemia * (ICD-9-CM 272.4) Back Pain (ICD-9-CM 724.5) Spine Fracture (ICD-9-CM 805.8) Diplopia * (ICD-9-CM 368.2) Dermatophytosis of nail (ICD-9-CM 110.1) Unspecified Viral Infection (ICD-9-CM 079.99) Sinusitis nasal (ICD-9-CM 473.9) Dyspepsia * (ICD-9-CM 536.8) Rash and other nonspecific skin eruption (ICD-9-CM 782.1) Hematospermia Blood in urine Chronic obstructive lung disease (SNOMED CT 65465035) Obesity Prostatitis * (ICD-9-CM 601.9) Epidermal Cyst Emphysema, Pulmonary * (ICD-9-CM 492.8) Gross Hematuria (ICD-9-CM 599.71) Health Maintenance (ICD-9-CM V65.9) Hypertrophy (Benign) of Prostate without Urinary obstruction Vitamin D Deficiency (ICD-9-CM 268.9) Knee: arthralgia * (ICD-9-CM 719.46) Transient cerebral ischemia (SNOMED CT 465681275) Cerebrovascular accident (SNOMED CT 170017494) Insomnia Hypertensive disorder Dizziness Coronary arteriosclerosis Mixed hyperlipidemia Chronic obstructive lung disease Impaired fasting glycaemia Chronic prostatitis Gastroesophageal reflux disease without esophagitis Cataract Asbestos-induced pleural plaque Subjective: Pt says he is feeling better No pain neck or back Had TEE today Has alternating constipation with diarrhea since 3 months No pain abdomen No fever  Medications:   aspirin EC  81 mg Oral Daily   butamben-tetracaine-benzocaine        cholecalciferol  1,000 Units Oral Daily   enoxaparin (LOVENOX) injection  40 mg Subcutaneous QHS   fentaNYL       ferrous sulfate  325 mg Oral Q breakfast   [START ON 01/07/2021] furosemide  20 mg Oral Daily   ipratropium-albuterol  3 mL Nebulization Q6H   ipratropium-albuterol       lidocaine       midazolam       midazolam       multivitamin with minerals  1 tablet Oral Daily   omega-3 acid ethyl esters  1 g Oral Daily   pantoprazole  40 mg Oral Daily   rosuvastatin  40 mg Oral QHS   sodium chloride flush       sodium chloride flush        Objective: Vital signs in last 24 hours: Temp:  [97.4 F (36.3 C)-98.3 F (36.8 C)] 97.9 F (36.6 C) (11/08 1537) Pulse Rate:  [75-96] 78 (11/08 1537) Resp:  [16-31] 20 (11/08 1600) BP: (116-158)/(38-89) 144/44 (11/08 1537) SpO2:  [88 %-100 %] 98 % (11/08 1537) Weight:  [79.9 kg] 79.9 kg (11/08 0333)  PHYSICAL EXAM:  General: Alert, cooperative, some resp distress, . Hyperpnea- has oxygen Head: Normocephalic, without obvious abnormality, atraumatic. Eyes: Conjunctivae clear, anicteric sclerae. Pupils are equal ENT Nares normal. No drainage or sinus tenderness. Lips, mucosa, and tongue normal. No Thrush Neck: Supple, symmetrical, no adenopathy, thyroid: non tender no carotid bruit and no JVD. Back: No CVA tenderness. Lungs: b/l air entry rhonchi. Heart: Regular rate and rhythm, no murmur, rub or gallop. Abdomen: Soft, non-tender,not distended. Bowel sounds normal. No masses Extremities: atraumatic, no cyanosis. No edema. No clubbing Skin: No rashes or lesions. Or bruising Lymph: Cervical, supraclavicular normal. Neurologic: Grossly non-focal  Lab Results Recent Labs    01/04/21 0544 01/05/21 0522  WBC  --  8.1  HGB  --  9.7*  HCT  --  29.4*  NA 133* 133*  K 4.3 4.1  CL 104 103  CO2 21* 22  BUN 20 17  CREATININE 1.44* 1.48*   Liver Panel Recent Labs    01/05/21 0522  ALBUMIN 2.3*    Microbiology: 01/03/21 BC-  enterococcus fecalis 01/06/21 Repeat BC sent UC NG Studies/Results: DG Chest 2 View  Result Date: 01/06/2021 CLINICAL DATA:  Shortness of breath EXAM: CHEST - 2 VIEW COMPARISON:  01/03/2021 FINDINGS: Small bilateral pleural effusions. Slight increased patchy airspace disease at the bases. Stable cardiomediastinal silhouette with aortic atherosclerosis. No pneumothorax. IMPRESSION: 1. Small bilateral pleural effusions with increasing atelectasis or infiltrates at the bases Electronically Signed   By: Donavan Foil M.D.   On: 01/06/2021 16:28   MR CERVICAL SPINE WO CONTRAST  Result Date: 01/05/2021 CLINICAL DATA:  Enterococcus bacteremia, neck pain, infection suspected EXAM: MRI CERVICAL AND THORACIC SPINE WITHOUT CONTRAST TECHNIQUE: Multiplanar and multiecho pulse sequences of the cervical and thoracic spine were obtained without intravenous contrast. COMPARISON:  None. FINDINGS: Evaluation is somewhat limited by motion artifact. MRI CERVICAL SPINE FINDINGS Alignment: Physiologic. Vertebrae: No acute fracture or suspicious lesion. Evaluation for osteomyelitis is somewhat limited in the absence of intravenous contrast; however no increased T2 signal is seen in the endplates or intervertebral discs. 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 Cord: Normal signal and morphology. Posterior Fossa, vertebral arteries, paraspinal tissues: Negative. Disc levels: C2-C3: No significant disc bulge. Facet arthropathy. No spinal canal stenosis. Mild bilateral neural foraminal narrowing. C3-C4: Small left foraminal disc protrusion. Facet and uncovertebral hypertrophy. Severe left and moderate right neural foraminal narrowing. No spinal canal stenosis C4-C5: Moderate disc bulge. Facet arthropathy. Moderate spinal canal stenosis. Moderate bilateral neural foraminal narrowing. C5-C6: No significant disc bulge. Facet and uncovertebral hypertrophy. No spinal canal stenosis. Moderate bilateral  neural foraminal narrowing. C6-C7: No significant disc bulge. Facet arthropathy. No spinal canal stenosis. Mild bilateral neural foraminal narrowing. C7-T1: No significant disc bulge. No spinal canal stenosis or neuroforaminal narrowing. MRI THORACIC SPINE FINDINGS Alignment:  Physiologic. Vertebrae: No acute fracture. Endplate degenerative changes T4-T5 without increased signal within the disc, favored to be degenerative. There is additional increased T2 signal at T6-T7, with increased T1 signal at the superior endplate, likely combination of Modic type 1 and type 2 degenerative changes, with increased T2 signal within the intervertebral disc (series 21, image 11), favored to be reactive. Cord:  Normal signal and morphology. Paraspinal and other soft tissues: No paravertebral collection. Disc levels: Facet arthropathy in the lower thoracic spine, causes mild osseous canal narrowing without significant spinal canal stenosis. Moderate left neural foraminal narrowing at T9-T10 and moderate right neural foraminal narrowing at T9-T10 and T10-T11. Partially visualized lumbar spine demonstrates a chronic appearing compression deformity of L1. IMPRESSION: 1. No definite evidence of osteomyelitis discitis, although evaluation is somewhat limited by the absence of intravenous contrast. 2. C4-C5 moderate spinal canal stenosis and moderate bilateral neural foraminal narrowing. 3. C3-C4 severe left and moderate right neural foraminal narrowing. 4. C5-C6 moderate bilateral neural foraminal narrowing. Electronically Signed   By: Merilyn Baba M.D.   On: 01/05/2021 17:43   MR THORACIC SPINE WO CONTRAST  Result Date: 01/05/2021 CLINICAL DATA:  Enterococcus bacteremia, neck pain, infection suspected EXAM: MRI CERVICAL AND THORACIC SPINE WITHOUT CONTRAST TECHNIQUE: Multiplanar and multiecho pulse sequences of the cervical and thoracic spine were obtained without intravenous contrast. COMPARISON:  None. FINDINGS: Evaluation is  somewhat limited by motion artifact. MRI CERVICAL SPINE FINDINGS Alignment: Physiologic. Vertebrae: No acute fracture or suspicious lesion. Evaluation for osteomyelitis is somewhat limited in the absence of intravenous contrast; however no increased T2 signal is seen in the endplates or intervertebral discs. 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 Cord: Normal signal and morphology. Posterior Fossa, vertebral arteries, paraspinal tissues: Negative. Disc levels: C2-C3: No significant disc bulge. Facet arthropathy. No spinal canal stenosis. Mild bilateral neural foraminal narrowing. C3-C4: Small left foraminal disc protrusion. Facet and uncovertebral hypertrophy. Severe left and moderate right neural foraminal narrowing. No spinal canal stenosis C4-C5: Moderate disc bulge. Facet arthropathy. Moderate spinal canal stenosis. Moderate bilateral neural foraminal narrowing. C5-C6: No significant disc bulge. Facet and uncovertebral hypertrophy. No spinal canal stenosis. Moderate bilateral neural foraminal narrowing. C6-C7: No significant disc bulge. Facet arthropathy. No spinal canal stenosis. Mild bilateral neural foraminal narrowing. C7-T1: No significant disc bulge. No spinal canal stenosis or neuroforaminal narrowing. MRI THORACIC SPINE FINDINGS Alignment:  Physiologic. Vertebrae: No acute fracture. Endplate degenerative changes T4-T5 without increased signal within the disc, favored to be degenerative. There is additional increased T2 signal at T6-T7, with increased T1 signal at the superior endplate, likely combination of Modic type 1 and type 2 degenerative changes, with increased T2 signal within the intervertebral disc (series 21, image 11), favored to be reactive. Cord:  Normal signal and morphology. Paraspinal and other soft tissues: No paravertebral collection. Disc levels: Facet arthropathy in the lower thoracic spine, causes mild osseous canal narrowing without  significant spinal canal stenosis. Moderate left neural foraminal narrowing at T9-T10 and moderate right neural foraminal narrowing at T9-T10 and T10-T11. Partially visualized lumbar spine demonstrates a chronic appearing compression deformity of L1. IMPRESSION: 1. No definite evidence of osteomyelitis discitis, although evaluation is somewhat limited by the absence of intravenous contrast. 2. C4-C5 moderate spinal canal stenosis and moderate bilateral neural foraminal narrowing. 3. C3-C4 severe left and moderate right neural foraminal narrowing. 4. C5-C6 moderate bilateral neural foraminal narrowing. Electronically Signed   By: Merilyn Baba M.D.   On: 01/05/2021 17:43     Assessment/Plan: ?pt presenting with sob and cough for the past month- intially started with fever and was diagnosed as a viral infection a month ago at [place]  Now has SOB on exertion, orthopnea PND HE has underlying emphysema Asbestos pleural plaque    Enterococcus bacteremia- with symptoms of new onset CHF need to r/o endocarditis No hardware, no pacemaker or prosthetic valves Pt had 2 d echo- valves no vegetation but has aortic stenosis TEE done today did not show vegetation . Verbal communication  significant aortic valve regurgitation, prolapse of aortic valve leaflet. moderate TR, at least moderately elevated right heart pressures  Source of enterococcus unclear ( it is a gi pathogen) UC Neg Recommend post void bladder scan Pt is on ampicillin and ceftriaxone being treated like endocarditis until TEE official report.will decide on duration and mono therapy after that  Has had compression fracture thoracic spine and pain back and neck- MRI thoracic and cervical spine shows no discitis /infection though limited by absent contrast  Pt has constipation alternating with  diarrhea ( more constipation) since Aug 2022 He had gone to [place] on 10/17/20 with 35 episodes of diarrhea and a   CT ABD/PELVIS WO CONTRAST done on 10/18/20  showed 1. Large amount of fecal content within the rectal vault may be consistent with constipation in the correct clinical setting. 2. Wall thickening of the  urinary bladder. 3. Prominence of the prostate gland. 4. Cholelithiasis.  He never had diarrhea for the past 3 days, , had 3 episodes early this am as No pain abdomen , no fever or leucocytosis this is not cdiff  I would hold off on doing any cdiff test as clinically cdiff is not suspected Please make sure that patient does not have fecal overload and the diarrhea is a manifestation of overflow        Acute hypoxia CHF-/COPD    AKI-    Anemia-   Discussed the management with the patient and his nurse and care team

## 2021-01-07 ENCOUNTER — Inpatient Hospital Stay: Payer: No Typology Code available for payment source

## 2021-01-07 ENCOUNTER — Encounter: Payer: Self-pay | Admitting: Cardiovascular Disease

## 2021-01-07 DIAGNOSIS — J9601 Acute respiratory failure with hypoxia: Secondary | ICD-10-CM | POA: Diagnosis not present

## 2021-01-07 DIAGNOSIS — I351 Nonrheumatic aortic (valve) insufficiency: Secondary | ICD-10-CM

## 2021-01-07 LAB — RENAL FUNCTION PANEL
Albumin: 2.6 g/dL — ABNORMAL LOW (ref 3.5–5.0)
Anion gap: 10 (ref 5–15)
BUN: 15 mg/dL (ref 8–23)
CO2: 22 mmol/L (ref 22–32)
Calcium: 8.2 mg/dL — ABNORMAL LOW (ref 8.9–10.3)
Chloride: 103 mmol/L (ref 98–111)
Creatinine, Ser: 1.53 mg/dL — ABNORMAL HIGH (ref 0.61–1.24)
GFR, Estimated: 45 mL/min — ABNORMAL LOW (ref >60)
Glucose, Bld: 106 mg/dL — ABNORMAL HIGH (ref 70–99)
Phosphorus: 4.1 mg/dL (ref 2.5–4.6)
Potassium: 4.1 mmol/L (ref 3.5–5.1)
Sodium: 135 mmol/L (ref 135–145)

## 2021-01-07 LAB — CBC
HCT: 31.3 % — ABNORMAL LOW (ref 39.0–52.0)
Hemoglobin: 10.2 g/dL — ABNORMAL LOW (ref 13.0–17.0)
MCH: 28.8 pg (ref 26.0–34.0)
MCHC: 32.6 g/dL (ref 30.0–36.0)
MCV: 88.4 fL (ref 80.0–100.0)
Platelets: 244 10*3/uL (ref 150–400)
RBC: 3.54 MIL/uL — ABNORMAL LOW (ref 4.22–5.81)
RDW: 14.1 % (ref 11.5–15.5)
WBC: 11.3 10*3/uL — ABNORMAL HIGH (ref 4.0–10.5)
nRBC: 0 % (ref 0.0–0.2)

## 2021-01-07 IMAGING — DX DG ABDOMEN 1V
2 series · 2 of 2 positions shown · non-contrast
Comparison: None.

CLINICAL DATA: Constipation

EXAM:
ABDOMEN - 1 VIEW

[abdomen supine (1 of 2)]
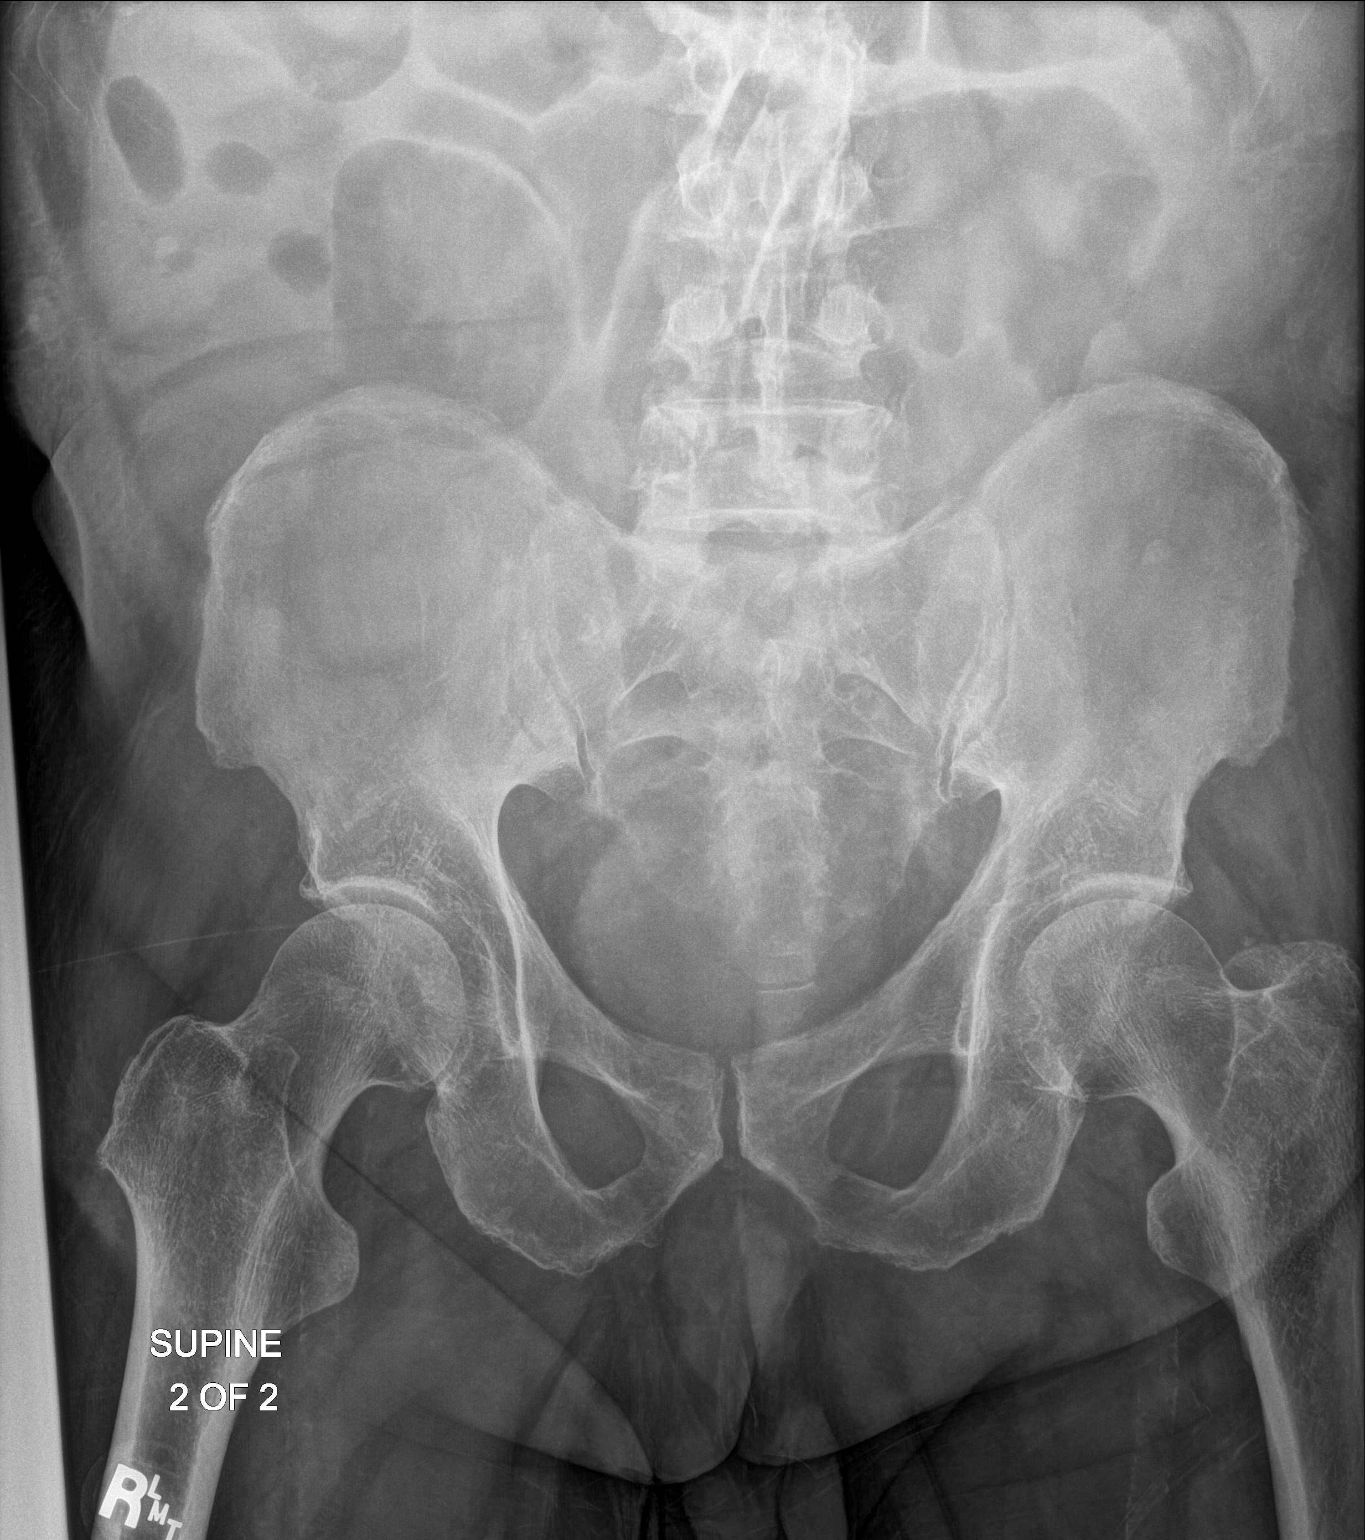

[abdomen supine (2 of 2)]
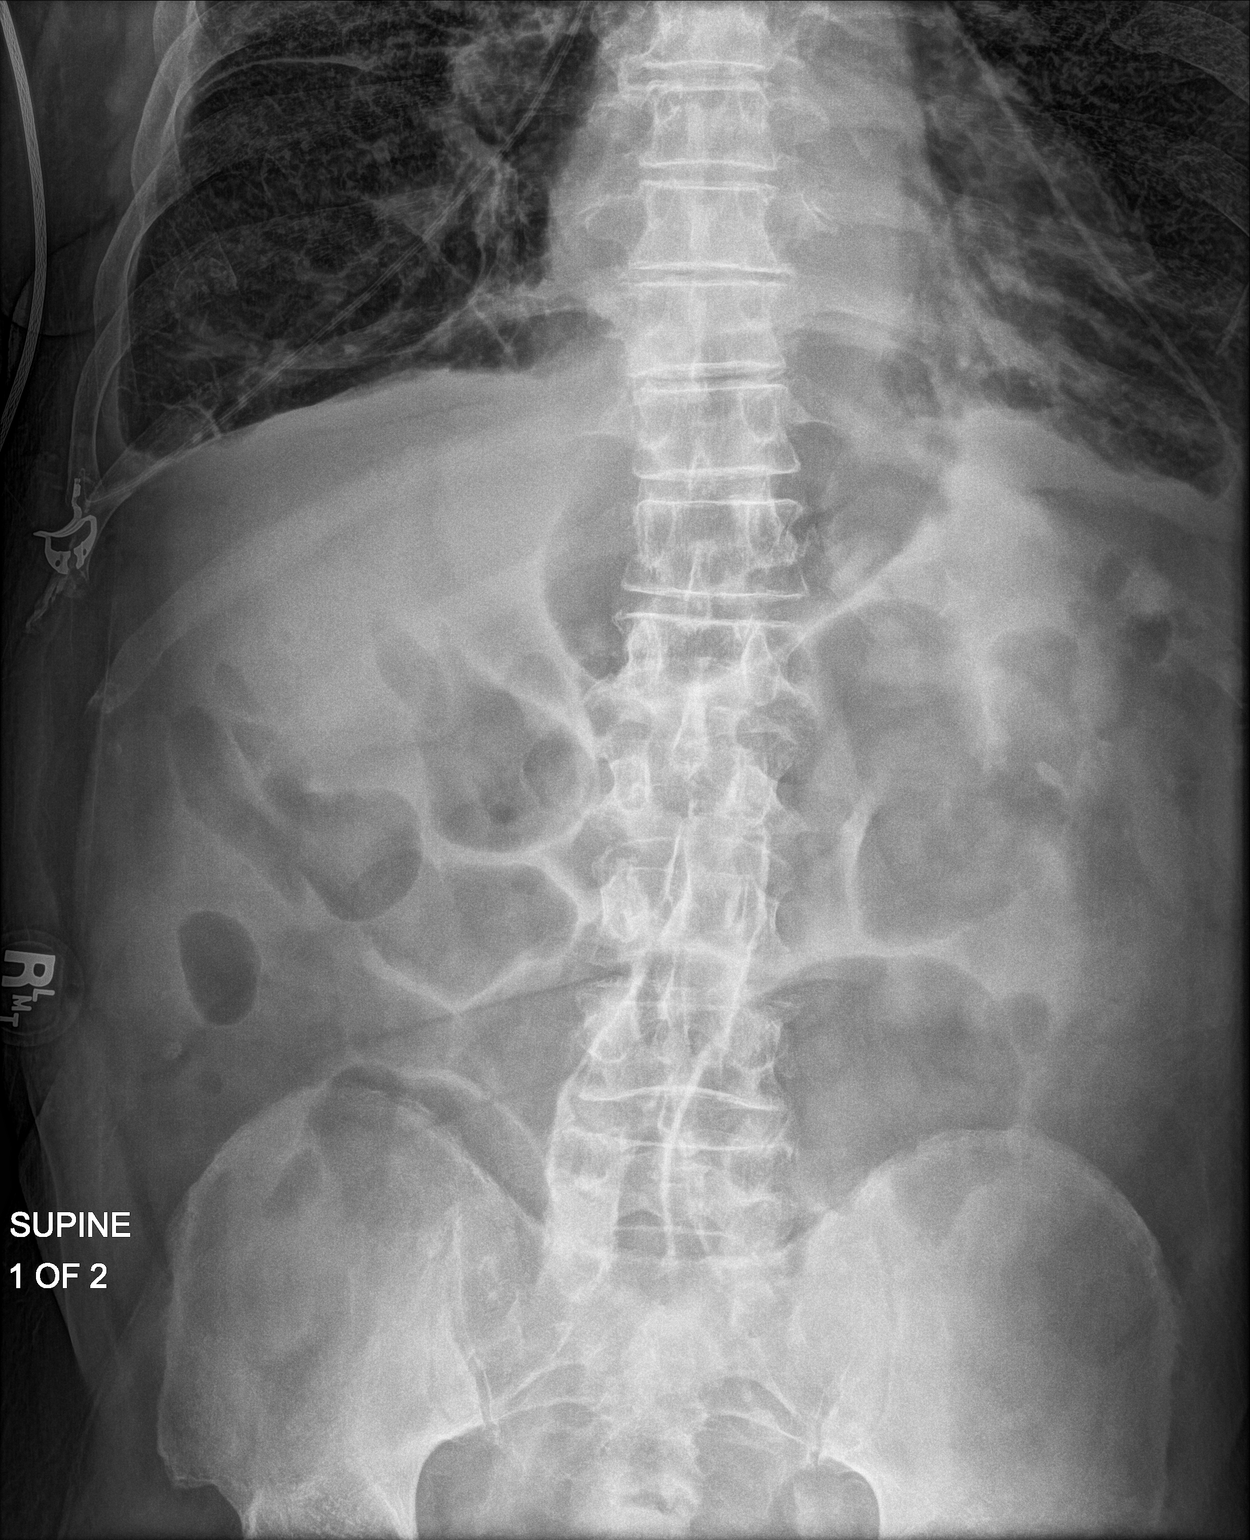

[2 of 2 positions shown; findings below may reference images not displayed]

FINDINGS: Air seen throughout much of the colon apart from rectum. There is no
significant stool burden. Increased pelvic density may reflect
bladder or stool within rectum.
IMPRESSION: No significant stool burden. Increased pelvic density may reflect
bladder or stool within rectum.

## 2021-01-07 MED ORDER — IPRATROPIUM BROMIDE 0.02 % IN SOLN
0.5000 mg | Freq: Four times a day (QID) | RESPIRATORY_TRACT | Status: DC
  Administered 2021-01-08 (×4): 0.5 mg via RESPIRATORY_TRACT
  Filled 2021-01-07 (×4): qty 2.5

## 2021-01-07 MED ORDER — MORPHINE SULFATE (PF) 2 MG/ML IV SOLN
INTRAVENOUS | Status: AC
  Filled 2021-01-07: qty 1

## 2021-01-07 MED ORDER — LEVALBUTEROL HCL 0.63 MG/3ML IN NEBU
0.6300 mg | INHALATION_SOLUTION | Freq: Four times a day (QID) | RESPIRATORY_TRACT | Status: DC
  Administered 2021-01-08 (×4): 0.63 mg via RESPIRATORY_TRACT
  Filled 2021-01-07 (×4): qty 3

## 2021-01-07 MED ORDER — MORPHINE SULFATE (PF) 2 MG/ML IV SOLN
1.0000 mg | Freq: Once | INTRAVENOUS | Status: AC
  Administered 2021-01-07: 1 mg via INTRAVENOUS

## 2021-01-07 MED ORDER — PREDNISONE 20 MG PO TABS
40.0000 mg | ORAL_TABLET | Freq: Every day | ORAL | Status: DC
Start: 2021-01-07 — End: 2021-01-08
  Administered 2021-01-07: 40 mg via ORAL
  Filled 2021-01-07: qty 2

## 2021-01-07 NOTE — Progress Notes (Addendum)
Progress Note  Patient Name: Phillip Chavez Date of Encounter: 01/07/2021  [name] Cardiologist:  new Dr. Fletcher Anon  Subjective   Still with shortness of breath and wheezing.  Underwent TEE yesterday.  Being treated for bacteremia.  Inpatient Medications    Scheduled Meds:  aspirin EC  81 mg Oral Daily   cholecalciferol  1,000 Units Oral Daily   enoxaparin (LOVENOX) injection  40 mg Subcutaneous QHS   ferrous sulfate  325 mg Oral Q breakfast   furosemide  20 mg Oral Daily   ipratropium-albuterol  3 mL Nebulization Q6H   multivitamin with minerals  1 tablet Oral Daily   omega-3 acid ethyl esters  1 g Oral Daily   pantoprazole  40 mg Oral Daily   rosuvastatin  40 mg Oral QHS   Continuous Infusions:  sodium chloride Stopped (01/05/21 1432)   sodium chloride Stopped (01/06/21 1309)   ampicillin (OMNIPEN) IV 2 g (01/07/21 1140)   cefTRIAXone (ROCEPHIN)  IV 2 g (01/07/21 0943)   PRN Meds: sodium chloride, acetaminophen, albuterol, dextromethorphan-guaiFENesin, hydrALAZINE, levalbuterol, LORazepam, ondansetron (ZOFRAN) IV   Vital Signs    Vitals:   01/07/21 0430 01/07/21 0814 01/07/21 0900 01/07/21 1128  BP: (!) 161/61  (!) 154/64 (!) 159/54  Pulse: 99   (!) 105  Resp: 16  17 17   Temp: 98.5 F (36.9 C)  98.4 F (36.9 C) 99.7 F (37.6 C)  TempSrc:   Oral   SpO2: 99% 98% 98% 97%  Weight:      Height:        Intake/Output Summary (Last 24 hours) at 01/07/2021 1310 Last data filed at 01/07/2021 1200 Gross per 24 hour  Intake 890 ml  Output 1725 ml  Net -835 ml   Last 3 Weights 01/06/2021 01/03/2021 11/22/2019  Weight (lbs) 176 lb 2.4 oz 172 lb 182 lb 9.6 oz  Weight (kg) 79.9 kg 78.019 kg 82.827 kg      Telemetry    Normal sinus rhythm, heart rate 78 personally Reviewed  ECG     - Personally Reviewed  Physical Exam   GEN: Mild to moderate respiratory distress Neck: No JVD Cardiac: RRR, Respiratory:  rhonchorous breath sounds, mild wheezing GI: Soft,  nontender, non-distended  MS: trace Neuro:  Nonfocal  Psych: Normal affect   Labs    High Sensitivity Troponin:   Recent Labs  Lab 01/03/21 0605 01/03/21 0838 01/03/21 1400 01/04/21 0544 01/05/21 0522  TROPONINIHS 105* 147* 107* 66* 62*     Chemistry Recent Labs  Lab 01/03/21 1046 01/04/21 0544 01/05/21 0522 01/07/21 0443  NA 131* 133* 133* 135  K 3.8 4.3 4.1 4.1  CL 100 104 103 103  CO2 22 21* 22 22  GLUCOSE 132* 111* 194* 106*  BUN 20 20 17 15   CREATININE 1.64* 1.44* 1.48* 1.53*  CALCIUM 8.0* 8.0* 8.1* 8.2*  MG  --  2.3  --   --   PROT 6.7  --   --   --   ALBUMIN 2.5*  --  2.3* 2.6*  AST 66*  --   --   --   ALT 85*  --   --   --   ALKPHOS 87  --   --   --   BILITOT 1.3*  --   --   --   GFRNONAA 42* 49* 47* 45*  ANIONGAP 9 8 8 10     Lipids  Recent Labs  Lab 01/04/21 0544  CHOL 123  TRIG  120  HDL 13*  LDLCALC 86  CHOLHDL 9.5    Hematology Recent Labs  Lab 01/03/21 0605 01/04/21 2051 01/05/21 0522 01/07/21 0443  WBC 12.1*  --  8.1 11.3*  RBC 3.71* 3.54* 3.33* 3.54*  HGB 10.6*  --  9.7* 10.2*  HCT 32.2*  --  29.4* 31.3*  MCV 86.8  --  88.3 88.4  MCH 28.6  --  29.1 28.8  MCHC 32.9  --  33.0 32.6  RDW 13.6  --  13.9 14.1  PLT 245  --  204 244   Thyroid No results for input(s): TSH, FREET4 in the last 168 hours.  BNP Recent Labs  Lab 01/03/21 0605  BNP 473.6*    DDimer No results for input(s): DDIMER in the last 168 hours.   Radiology    DG Chest 2 View  Result Date: 01/06/2021 CLINICAL DATA:  Shortness of breath EXAM: CHEST - 2 VIEW COMPARISON:  01/03/2021 FINDINGS: Small bilateral pleural effusions. Slight increased patchy airspace disease at the bases. Stable cardiomediastinal silhouette with aortic atherosclerosis. No pneumothorax. IMPRESSION: 1. Small bilateral pleural effusions with increasing atelectasis or infiltrates at the bases Electronically Signed   By: Donavan Foil M.D.   On: 01/06/2021 16:28   MR CERVICAL SPINE WO  CONTRAST  Result Date: 01/05/2021 CLINICAL DATA:  Enterococcus bacteremia, neck pain, infection suspected EXAM: MRI CERVICAL AND THORACIC SPINE WITHOUT CONTRAST TECHNIQUE: Multiplanar and multiecho pulse sequences of the cervical and thoracic spine were obtained without intravenous contrast. COMPARISON:  None. FINDINGS: Evaluation is somewhat limited by motion artifact. MRI CERVICAL SPINE FINDINGS Alignment: Physiologic. Vertebrae: No acute fracture or suspicious lesion. Evaluation for osteomyelitis is somewhat limited in the absence of intravenous contrast; however no increased T2 signal is seen in the endplates or intervertebral discs. 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 Cord: Normal signal and morphology. Posterior Fossa, vertebral arteries, paraspinal tissues: Negative. Disc levels: C2-C3: No significant disc bulge. Facet arthropathy. No spinal canal stenosis. Mild bilateral neural foraminal narrowing. C3-C4: Small left foraminal disc protrusion. Facet and uncovertebral hypertrophy. Severe left and moderate right neural foraminal narrowing. No spinal canal stenosis C4-C5: Moderate disc bulge. Facet arthropathy. Moderate spinal canal stenosis. Moderate bilateral neural foraminal narrowing. C5-C6: No significant disc bulge. Facet and uncovertebral hypertrophy. No spinal canal stenosis. Moderate bilateral neural foraminal narrowing. C6-C7: No significant disc bulge. Facet arthropathy. No spinal canal stenosis. Mild bilateral neural foraminal narrowing. C7-T1: No significant disc bulge. No spinal canal stenosis or neuroforaminal narrowing. MRI THORACIC SPINE FINDINGS Alignment:  Physiologic. Vertebrae: No acute fracture. Endplate degenerative changes T4-T5 without increased signal within the disc, favored to be degenerative. There is additional increased T2 signal at T6-T7, with increased T1 signal at the superior endplate, likely combination of Modic type 1 and type 2  degenerative changes, with increased T2 signal within the intervertebral disc (series 21, image 11), favored to be reactive. Cord:  Normal signal and morphology. Paraspinal and other soft tissues: No paravertebral collection. Disc levels: Facet arthropathy in the lower thoracic spine, causes mild osseous canal narrowing without significant spinal canal stenosis. Moderate left neural foraminal narrowing at T9-T10 and moderate right neural foraminal narrowing at T9-T10 and T10-T11. Partially visualized lumbar spine demonstrates a chronic appearing compression deformity of L1. IMPRESSION: 1. No definite evidence of osteomyelitis discitis, although evaluation is somewhat limited by the absence of intravenous contrast. 2. C4-C5 moderate spinal canal stenosis and moderate bilateral neural foraminal narrowing. 3. C3-C4 severe left and moderate  right neural foraminal narrowing. 4. C5-C6 moderate bilateral neural foraminal narrowing. Electronically Signed   By: Merilyn Baba M.D.   On: 01/05/2021 17:43   MR THORACIC SPINE WO CONTRAST  Result Date: 01/05/2021 CLINICAL DATA:  Enterococcus bacteremia, neck pain, infection suspected EXAM: MRI CERVICAL AND THORACIC SPINE WITHOUT CONTRAST TECHNIQUE: Multiplanar and multiecho pulse sequences of the cervical and thoracic spine were obtained without intravenous contrast. COMPARISON:  None. FINDINGS: Evaluation is somewhat limited by motion artifact. MRI CERVICAL SPINE FINDINGS Alignment: Physiologic. Vertebrae: No acute fracture or suspicious lesion. Evaluation for osteomyelitis is somewhat limited in the absence of intravenous contrast; however no increased T2 signal is seen in the endplates or intervertebral discs. 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 Cord: Normal signal and morphology. Posterior Fossa, vertebral arteries, paraspinal tissues: Negative. Disc levels: C2-C3: No significant disc bulge. Facet arthropathy. No spinal canal  stenosis. Mild bilateral neural foraminal narrowing. C3-C4: Small left foraminal disc protrusion. Facet and uncovertebral hypertrophy. Severe left and moderate right neural foraminal narrowing. No spinal canal stenosis C4-C5: Moderate disc bulge. Facet arthropathy. Moderate spinal canal stenosis. Moderate bilateral neural foraminal narrowing. C5-C6: No significant disc bulge. Facet and uncovertebral hypertrophy. No spinal canal stenosis. Moderate bilateral neural foraminal narrowing. C6-C7: No significant disc bulge. Facet arthropathy. No spinal canal stenosis. Mild bilateral neural foraminal narrowing. C7-T1: No significant disc bulge. No spinal canal stenosis or neuroforaminal narrowing. MRI THORACIC SPINE FINDINGS Alignment:  Physiologic. Vertebrae: No acute fracture. Endplate degenerative changes T4-T5 without increased signal within the disc, favored to be degenerative. There is additional increased T2 signal at T6-T7, with increased T1 signal at the superior endplate, likely combination of Modic type 1 and type 2 degenerative changes, with increased T2 signal within the intervertebral disc (series 21, image 11), favored to be reactive. Cord:  Normal signal and morphology. Paraspinal and other soft tissues: No paravertebral collection. Disc levels: Facet arthropathy in the lower thoracic spine, causes mild osseous canal narrowing without significant spinal canal stenosis. Moderate left neural foraminal narrowing at T9-T10 and moderate right neural foraminal narrowing at T9-T10 and T10-T11. Partially visualized lumbar spine demonstrates a chronic appearing compression deformity of L1. IMPRESSION: 1. No definite evidence of osteomyelitis discitis, although evaluation is somewhat limited by the absence of intravenous contrast. 2. C4-C5 moderate spinal canal stenosis and moderate bilateral neural foraminal narrowing. 3. C3-C4 severe left and moderate right neural foraminal narrowing. 4. C5-C6 moderate bilateral  neural foraminal narrowing. Electronically Signed   By: Merilyn Baba M.D.   On: 01/05/2021 17:43   ECHO TEE  Result Date: 01/07/2021    TRANSESOPHOGEAL ECHO REPORT   Patient Name:   JIMMYLEE RATTERREE Date of Exam: 01/06/2021 Medical Rec #:  [id number]      Height:       65.0 in Accession #:    [id number]     Weight:       176.1 lb Date of Birth:  Jun 17, 1940      BSA:          1.874 m Patient Age:    80 years       BP:           120/49 mmHg Patient Gender: M              HR:           79 bpm. Exam Location:  [facility] Procedure: Transesophageal Echo, Color Doppler, Cardiac Doppler and Saline  Contrast Bubble Study Indications:     bacteremia, r/o endocarditis, aortic valve disorder  History:         Patient has prior history of Echocardiogram examinations, most                  recent 01/04/2021. CHF, COPD; Risk Factors:Hypertension and                  Dyslipidemia.  Sonographer:     Sherrie Sport Referring Phys:  [id number] [id number] A ARIDA Diagnosing Phys: Ida Rogue MD PROCEDURE: After discussion of the risks and benefits of a TEE, an informed consent was obtained from the patient. TEE procedure time was 30 minutes. The transesophogeal probe was passed without difficulty through the esophogus of the patient. Imaged were obtained with the patient in a left lateral decubitus position. Local oropharyngeal anesthetic was provided with Benzocaine spray and Cetacaine. Sedation performed by performing physician. Image quality was excellent. The patient's vital signs; including heart rate, blood pressure, and oxygen saturation; remained stable throughout the procedure. The patient developed no complications during the procedure. IMPRESSIONS  1. No valve endocarditis noted.  2. Left ventricular ejection fraction, by estimation, is 60 to 65%. The left ventricle has normal function. The left ventricle has no regional wall motion abnormalities.  3. Right ventricular systolic function is normal. The right ventricular size is  normal. There is moderately elevated pulmonary artery systolic pressure. The estimated right ventricular systolic pressure is 73.5 mmHg.  4. Left atrial size was moderately dilated. No left atrial/left atrial appendage thrombus was detected.  5. Right atrial size was mildly dilated.  6. The mitral valve is normal in structure. Mild mitral valve regurgitation. No evidence of mitral stenosis.  7. Tricuspid valve regurgitation is moderate to severe.  8. 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  9. 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 No aortic stenosis is present. 10. There is mild to moderate (Grade II-III) plaque involving the descending aorta. 11. Agitated saline contrast bubble study was negative, with no evidence of any interatrial shunt. Conclusion(s)/Recommendation(s): Normal biventricular function without evidence of hemodynamically significant valvular heart disease. FINDINGS  Left Ventricle: Left ventricular ejection fraction, by estimation, is 60 to 65%. The left ventricle has normal function. The left ventricle has no regional wall motion abnormalities. The left ventricular internal cavity size was normal in size. There is  no left ventricular hypertrophy. Right Ventricle: The right ventricular size is normal. No increase in right ventricular wall thickness. Right ventricular systolic function is normal. There is moderately elevated pulmonary artery systolic pressure. The tricuspid regurgitant velocity is 3.41 m/s, and with an assumed right atrial pressure of 10 mmHg, the estimated right ventricular systolic pressure is 32.9 mmHg. Left Atrium: Left atrial size was moderately dilated. No left atrial/left atrial appendage thrombus was detected. Right Atrium: Right atrial size was mildly dilated. Pericardium: There is no evidence of pericardial effusion. Mitral Valve: The mitral valve is  normal in structure. Mild mitral valve regurgitation. No evidence of mitral valve stenosis. Tricuspid Valve: The tricuspid valve is normal in structure. Tricuspid valve regurgitation is moderate to severe. No evidence of tricuspid stenosis. Aortic Valve: The aortic valve is tricuspid. 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 No aortic stenosis is present. Pulmonic Valve: The pulmonic valve was normal in structure. Pulmonic valve regurgitation is not visualized. No evidence of pulmonic stenosis. Aorta: The aortic root  is normal in size and structure. There is moderate (Grade III) plaque involving the descending aorta. Venous: The inferior vena cava is normal in size with greater than 50% respiratory variability, suggesting right atrial pressure of 3 mmHg. IAS/Shunts: No atrial level shunt detected by color flow Doppler. Agitated saline contrast was given intravenously to evaluate for intracardiac shunting. Agitated saline contrast bubble study was negative, with no evidence of any interatrial shunt. There  is no evidence of a patent foramen ovale. There is no evidence of an atrial septal defect.  TRICUSPID VALVE TR Peak grad:   46.5 mmHg TR Vmax:        341.00 cm/s Ida Rogue MD Electronically signed by Ida Rogue MD Signature Date/Time: 01/07/2021/11:27:44 AM    Final     Cardiac Studies   TEE 01/06/2021 1. No valve endocarditis noted.   2. Left ventricular ejection fraction, by estimation, is 60 to 65%. The  left ventricle has normal function. The left ventricle has no regional  wall motion abnormalities.   3. Right ventricular systolic function is normal. The right ventricular  size is normal. There is moderately elevated pulmonary artery systolic  pressure. The estimated right ventricular systolic pressure is 87.6 mmHg.   4. Left atrial size was moderately dilated. No left atrial/left atrial  appendage thrombus was detected.   5. Right atrial size was mildly dilated.   6. The mitral  valve is normal in structure. Mild mitral valve  regurgitation. No evidence of mitral stenosis.   7. Tricuspid valve regurgitation is moderate to severe.   8. The aortic valve is tricuspid.Severe prolapse of the right coronary  cusp with significant leaflet motion extending above and below the valve  plane.   9. 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 No aortic stenosis  is present.  10. There is mild to moderate (Grade II-III) plaque involving the  descending aorta.  11. Agitated saline contrast bubble study was negative, with no evidence  of any interatrial shunt.   Patient Profile     80 y.o. male COPD, hypertension presenting with shortness of breath, cough.  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  Bacteremia -TEE with no evidence for endocarditis -Antibiotic as per infectious disease team  3. Copd, pna -Management as per primary team  No additional cardiac  testing, intervention planned this admission.  Please let us know if additional cardiac input is needed.  Cardiology will sign off.  Total encounter time 40 minutes  Greater than 50% was spent in counseling and coordination of care with the patient and family including wife at bedside, son over the phone.     Signed, Kate Sable, MD  01/07/2021, 1:10 PM

## 2021-01-07 NOTE — Plan of Care (Signed)
  Problem: Education: Goal: Knowledge of General Education information will improve Description: Including pain rating scale, medication(s)/side effects and non-pharmacologic comfort measures Outcome: Progressing   Problem: Clinical Measurements: Goal: Ability to maintain clinical measurements within normal limits will improve Outcome: Progressing   Problem: Clinical Measurements: Goal: Respiratory complications will improve Outcome: Progressing   Problem: Clinical Measurements: Goal: Cardiovascular complication will be avoided Outcome: Progressing   Problem: Activity: Goal: Risk for activity intolerance will decrease Outcome: Progressing   Problem: Nutrition: Goal: Adequate nutrition will be maintained Outcome: Progressing   Problem: Coping: Goal: Level of anxiety will decrease Outcome: Progressing   Problem: Pain Managment: Goal: General experience of comfort will improve Outcome: Progressing   Problem: Safety: Goal: Ability to remain free from injury will improve Outcome: Progressing   Problem: Skin Integrity: Goal: Risk for impaired skin integrity will decrease Outcome: Progressing

## 2021-01-07 NOTE — Progress Notes (Signed)
PROGRESS NOTE    Phillip Chavez  YJW:[id number] DOB: [birth year] DOA: 01/03/2021 PCP: [facility] [place]  [id number]   Assessment & Plan:   Principal Problem:   Acute respiratory failure with hypoxia ([place]) Active Problems:   CAP (community acquired pneumonia)   Sepsis ([place])   COPD (chronic obstructive pulmonary disease) ([place])   GERD (gastroesophageal reflux disease)   HTN (hypertension)   HLD (hyperlipidemia)   CHF exacerbation ([facility])   Elevated troponin   AKI (acute kidney injury) ([place])   Normocytic anemia   Aortic valve regurgitation   Phillip Chavez is a 80 y.o. male with medical history significant of hypertension, hyperlipidemia, COPD not on oxygen at home, GERD, recently diagnosed CHF in [place], who presents with shortness of breath.   Patient states that he has been having upper respiratory symptoms of for almost a month. He was initially seen at the [place] and diagnosed with viral upper respiratory tract infection.  This lasted for about 10 days and he seemed to get somewhat better but then symptoms returned in past week.  He has shortness of breath, productive cough with greenish colored sputum production. Patient has fever and chills.  Patient also has worsening bilateral lower extremity edema and orthopnea.  Per patient he was recently diagnosed with CHF but no echo done yet.  He was also hypoxic at 89% on arrival to ED which improved to high 90s on 2 L of oxygen.  He met sepsis criteria with temperature of 100.4, leukocytosis and chest x-ray concerning for right middle lobe infiltrate and streaky bilateral basilar opacities.  AKI with creatinine of 1.64, no recent baseline, prior creatinine in the system was 1.17 in December 2014. Preliminary blood cultures with Enterococcus faecalis. Initially started on Levaquin which were switched to Unasyn . Echo with normal EF, no regional wall motion abnormalities, normal diastolic function and mild aortic stenosis and no vegetations  noted.   ID was consulted for Enterococcus bacteremia, they added ceftriaxone empirically for endocarditis until patient had TEE to rule it out completely.  Cardiology consulted for TEE.   Patient was also having neck and back pain, MRI of cervical, thoracic and lumbar spine ordered to rule out any discitis or osteomyelitis, unable to complete MRI of lumbar spine, pain is mostly in neck and upper back, negative for any osteomyelitis or discitis but did show some foraminal narrowing at cervical level which can be taken care as an outpatient.  TEE neg for vegetation.   Sepsis secondary to CAP and Enterococcus faecalis bacteremia.  Met sepsis criteria with fever, leukocytosis and concern of pneumonia, later blood cultures came back positive for Enterococcus faecalis.    Enterococcus bacteremia Patient received 1 dose of vancomycin and Rocephin in ED and then started on Levaquin. Levaquin was switched with Unasyn  based on preliminary blood cultures.  D added ceftriaxone empirically for endocarditis Unasyn was switched with ampicillin by ID. Preliminary repeat blood cultures negative so far.   --TEE neg for vegetation Plan: --cont ampicillin --d/c ceftriaxone   Acute respiratory failure with hypoxia secondary to  pneumonia  Mod to severe aortic regurg COPD exacerbation --Patient was hypoxic in high 80s on room air requiring up to 2 L of oxygen.  No baseline oxygen requirement.   --start tx for COPD exacerbation today. --Continue supplemental O2 to keep sats between 88-92%, wean as tolerated  COPD exacerbation --severe dyspnea, increased cough with sputum production --start prednisone 40 mg daily --BiPAP PRN  --Xopenx and Atrovent q6h  Mod to severe aortic regurg --cont oral lasix to keep pt net even  PNA --received 5 days of ceftriaxone  Elevated troponin.   Peaked at 147 and now trending down.  Likely secondary to demand ischemia with CHF exacerbation.  No chest pain.      Hypertension.   Blood pressure within goal. -Keep holding home dose of Cozaar for concern of AKI. -IV hydralazine as needed   AKI (acute kidney injury) ([place]):  No recent baseline creatinine available.  Patient had creatinine 1.17 on 02/01/2013.  It was 1.64 on admission and now started trending down to 1.44>1.48 Renal ultrasound without any abnormality, simple renal cyst.  Might be his new baseline. -Monitor renal function -Avoid nephrotoxins   Normocytic anemia: Hemoglobin 10.6>>9.7.  Anemia panel with anemia of chronic disease and some iron deficiency.  FOBT negative. --cont iron supplement (new) -Follow-up with CBC   GERD (gastroesophageal reflux disease) -Protonix   HLD (hyperlipidemia) -Crestor   Generalized weakness. -PT/OT evaluation   DVT prophylaxis: Lovenox SQ Code Status: Full code  Family Communication: wife and son updated at bedside today  Level of care: Progressive Cardiac Dispo:   The patient is from: home Anticipated d/c is to: home Anticipated d/c date is: 2-3 days Patient currently is not medically ready to d/c due to: severe dyspnea, needs BiPAP today   Subjective and Interval History:  Pt reported coughing fits.  Had an episode of acute dyspnea early afternoon during which he couldn't catch his breaths and was very anxious.  BiPAP placed, as well as IV morphine given.     Objective: Vitals:   01/07/21 1442 01/07/21 1457 01/07/21 1551 01/07/21 1956  BP:  (!) 162/64 (!) 131/45 (!) 117/53  Pulse: (!) 129 93 91 85  Resp: (!) 36 (!) 28 (!) 24 20  Temp:  98.6 F (37 C) 98.2 F (36.8 C) 98 F (36.7 C)  TempSrc:  Oral    SpO2: 99% 97% 99% 100%  Weight:      Height:        Intake/Output Summary (Last 24 hours) at 01/07/2021 2009 Last data filed at 01/07/2021 1200 Gross per 24 hour  Intake 890 ml  Output 950 ml  Net -60 ml   Filed Weights   01/03/21 0603 01/06/21 0333  Weight: 78 kg 79.9 kg    Examination:   Constitutional: NAD, AAOx3,  speech difficult to understand HEENT: conjunctivae and lids normal, EOMI CV: No cyanosis.   RESP: increased RR, no obvious wheezing, on 3L Extremities: No effusions, edema in BLE SKIN: warm, dry Neuro: II - XII grossly intact.   Psych: anxious mood and affect.     Data Reviewed: I have personally reviewed following labs and imaging studies  CBC: Recent Labs  Lab 01/03/21 0605 01/05/21 0522 01/07/21 0443  WBC 12.1* 8.1 11.3*  NEUTROABS 9.3*  --   --   HGB 10.6* 9.7* 10.2*  HCT 32.2* 29.4* 31.3*  MCV 86.8 88.3 88.4  PLT 245 204 119   Basic Metabolic Panel: Recent Labs  Lab 01/03/21 0605 01/03/21 1046 01/04/21 0544 01/05/21 0522 01/07/21 0443  NA 130* 131* 133* 133* 135  K 4.3 3.8 4.3 4.1 4.1  CL 99 100 104 103 103  CO2 24 22 21* 22 22  GLUCOSE 119* 132* 111* 194* 106*  BUN '17 20 20 17 15  ' CREATININE 1.54* 1.64* 1.44* 1.48* 1.53*  CALCIUM 8.2* 8.0* 8.0* 8.1* 8.2*  MG  --   --  2.3  --   --  PHOS  --   --   --  3.7 4.1   GFR: Estimated Creatinine Clearance: 36.3 mL/min (A) (by C-G formula based on SCr of 1.53 mg/dL (H)). Liver Function Tests: Recent Labs  Lab 01/03/21 1046 01/05/21 0522 01/07/21 0443  AST 66*  --   --   ALT 85*  --   --   ALKPHOS 87  --   --   BILITOT 1.3*  --   --   PROT 6.7  --   --   ALBUMIN 2.5* 2.3* 2.6*   No results for input(s): LIPASE, AMYLASE in the last 168 hours. No results for input(s): AMMONIA in the last 168 hours. Coagulation Profile: No results for input(s): INR, PROTIME in the last 168 hours. Cardiac Enzymes: No results for input(s): CKTOTAL, CKMB, CKMBINDEX, TROPONINI in the last 168 hours. BNP (last 3 results) No results for input(s): PROBNP in the last 8760 hours. HbA1C: Recent Labs    01/05/21 0522  HGBA1C 6.1*   CBG: No results for input(s): GLUCAP in the last 168 hours. Lipid Profile: No results for input(s): CHOL, HDL, LDLCALC, TRIG, CHOLHDL, LDLDIRECT in the last 72 hours. Thyroid Function Tests: No  results for input(s): TSH, T4TOTAL, FREET4, T3FREE, THYROIDAB in the last 72 hours. Anemia Panel: Recent Labs    01/04/21 2051  VITAMINB12 382  RETICCTPCT 1.6   Sepsis Labs: Recent Labs  Lab 01/03/21 0917 01/03/21 1046  PROCALCITON  --  0.18  LATICACIDVEN 1.0  --     Recent Results (from the past 240 hour(s))  Resp Panel by RT-PCR (Flu A&B, Covid) Nasopharyngeal Swab     Status: None   Collection Time: 01/03/21  6:06 AM   Specimen: Nasopharyngeal Swab; Nasopharyngeal(NP) swabs in vial transport medium  Result Value Ref Range Status   SARS Coronavirus 2 by RT PCR NEGATIVE NEGATIVE Final    Comment: (NOTE) SARS-CoV-2 target nucleic acids are NOT DETECTED.  The SARS-CoV-2 RNA is generally detectable in upper respiratory specimens during the acute phase of infection. The lowest concentration of SARS-CoV-2 viral copies this assay can detect is 138 copies/mL. A negative result does not preclude SARS-Cov-2 infection and should not be used as the sole basis for treatment or other patient management decisions. A negative result may occur with  improper specimen collection/handling, submission of specimen other than nasopharyngeal swab, presence of viral mutation(s) within the areas targeted by this assay, and inadequate number of viral copies(<138 copies/mL). A negative result must be combined with clinical observations, patient history, and epidemiological information. The expected result is Negative.  Fact Sheet for Patients:  EntrepreneurPulse.com.au  Fact Sheet for Healthcare Providers:  IncredibleEmployment.be  This test is no t yet approved or cleared by the [place] FDA and  has been authorized for detection and/or diagnosis of SARS-CoV-2 by FDA under an Emergency Use Authorization (EUA). This EUA will remain  in effect (meaning this test can be used) for the duration of the COVID-19 declaration under Section 564(b)(1) of the Act,  21 U.S.C.section 360bbb-3(b)(1), unless the authorization is terminated  or revoked sooner.       Influenza A by PCR NEGATIVE NEGATIVE Final   Influenza B by PCR NEGATIVE NEGATIVE Final    Comment: (NOTE) The Xpert Xpress SARS-CoV-2/FLU/RSV plus assay is intended as an aid in the diagnosis of influenza from Nasopharyngeal swab specimens and should not be used as a sole basis for treatment. Nasal washings and aspirates are unacceptable for Xpert Xpress SARS-CoV-2/FLU/RSV testing.  Fact Sheet  for Patients: EntrepreneurPulse.com.au  Fact Sheet for Healthcare Providers: IncredibleEmployment.be  This test is not yet approved or cleared by the [place] FDA and has been authorized for detection and/or diagnosis of SARS-CoV-2 by FDA under an Emergency Use Authorization (EUA). This EUA will remain in effect (meaning this test can be used) for the duration of the COVID-19 declaration under Section 564(b)(1) of the Act, 21 U.S.C. section 360bbb-3(b)(1), unless the authorization is terminated or revoked.  Performed at [facility], [address] [place], [place] [address]   Blood culture (routine x 2)     Status: Abnormal   Collection Time: 01/03/21  9:17 AM   Specimen: BLOOD  Result Value Ref Range Status   Specimen Description   Final    BLOOD LEFT ANTECUBITAL Performed at [facility], [address] [place], [place] [address]    Special Requests   Final    BOTTLES DRAWN AEROBIC AND ANAEROBIC Blood Culture adequate volume Performed at [facility], [address] [place], [place] [address]    Culture  Setup Time   Final    GRAM POSITIVE COCCI IN BOTH AEROBIC AND ANAEROBIC BOTTLES CRITICAL RESULT CALLED TO, READ BACK BY AND VERIFIED WITH: NATHAN BELUE AT 2130 01/04/21.PMF Performed at [facility], [address] [place], [place] [address]    Culture (A)  Final    ENTEROCOCCUS  FAECALIS SUSCEPTIBILITIES PERFORMED ON PREVIOUS CULTURE WITHIN THE LAST 5 DAYS. Performed at [facility], [place] [address] [place], [place] [address]    Report Status 01/06/2021 FINAL  Final  Blood culture (routine x 2)     Status: Abnormal   Collection Time: 01/03/21  9:17 AM   Specimen: BLOOD LEFT HAND  Result Value Ref Range Status   Specimen Description   Final    BLOOD LEFT HAND Performed at [facility], [address] [place], [place] [address]    Special Requests   Final    BOTTLES DRAWN AEROBIC AND ANAEROBIC Blood Culture adequate volume Performed at [facility], [address] [place], [place] [address]    Culture  Setup Time   Final    Organism ID to follow GRAM POSITIVE COCCI IN BOTH AEROBIC AND ANAEROBIC BOTTLES CRITICAL RESULT CALLED TO, READ BACK BY AND VERIFIED WITH: NATHAN BELUE AT 0102 01/04/21.PMF Performed at [facility], [address] [place], [place] [address]    Culture ENTEROCOCCUS FAECALIS (A)  Final   Report Status 01/06/2021 FINAL  Final   Organism ID, Bacteria ENTEROCOCCUS FAECALIS  Final      Susceptibility   Enterococcus faecalis - MIC*    AMPICILLIN <=2 SENSITIVE Sensitive     VANCOMYCIN 1 SENSITIVE Sensitive     GENTAMICIN SYNERGY SENSITIVE Sensitive     * ENTEROCOCCUS FAECALIS  Blood Culture ID Panel (Reflexed)     Status: Abnormal   Collection Time: 01/03/21  9:17 AM  Result Value Ref Range Status   Enterococcus faecalis DETECTED (A) NOT DETECTED Final    Comment: CRITICAL RESULT CALLED TO, READ BACK BY AND VERIFIED WITH: NATHAN BELUE AT 6440 01/04/21.PMF    Enterococcus Faecium NOT DETECTED NOT DETECTED Final   Listeria monocytogenes NOT DETECTED NOT DETECTED Final   Staphylococcus species NOT DETECTED NOT DETECTED Final   Staphylococcus aureus (BCID) NOT DETECTED NOT DETECTED Final   Staphylococcus epidermidis NOT DETECTED NOT DETECTED Final   Staphylococcus lugdunensis NOT DETECTED NOT DETECTED  Final   Streptococcus species NOT DETECTED  NOT DETECTED Final   Streptococcus agalactiae NOT DETECTED NOT DETECTED Final   Streptococcus pneumoniae NOT DETECTED NOT DETECTED Final   Streptococcus pyogenes NOT DETECTED NOT DETECTED Final   A.calcoaceticus-baumannii NOT DETECTED NOT DETECTED Final   Bacteroides fragilis NOT DETECTED NOT DETECTED Final   Enterobacterales NOT DETECTED NOT DETECTED Final   Enterobacter cloacae complex NOT DETECTED NOT DETECTED Final   Escherichia coli NOT DETECTED NOT DETECTED Final   Klebsiella aerogenes NOT DETECTED NOT DETECTED Final   Klebsiella oxytoca NOT DETECTED NOT DETECTED Final   Klebsiella pneumoniae NOT DETECTED NOT DETECTED Final   Proteus species NOT DETECTED NOT DETECTED Final   Salmonella species NOT DETECTED NOT DETECTED Final   Serratia marcescens NOT DETECTED NOT DETECTED Final   Haemophilus influenzae NOT DETECTED NOT DETECTED Final   Neisseria meningitidis NOT DETECTED NOT DETECTED Final   Pseudomonas aeruginosa NOT DETECTED NOT DETECTED Final   Stenotrophomonas maltophilia NOT DETECTED NOT DETECTED Final   Candida albicans NOT DETECTED NOT DETECTED Final   Candida auris NOT DETECTED NOT DETECTED Final   Candida glabrata NOT DETECTED NOT DETECTED Final   Candida krusei NOT DETECTED NOT DETECTED Final   Candida parapsilosis NOT DETECTED NOT DETECTED Final   Candida tropicalis NOT DETECTED NOT DETECTED Final   Cryptococcus neoformans/gattii NOT DETECTED NOT DETECTED Final   Vancomycin resistance NOT DETECTED NOT DETECTED Final    Comment: Performed at [facility], [address], [place], [place] [address]  Urine Culture     Status: None   Collection Time: 01/04/21  5:21 PM   Specimen: Urine, Clean Catch  Result Value Ref Range Status   Specimen Description   Final    URINE, CLEAN CATCH Performed at [facility], [address] [place], [place] [address]    Special Requests   Final    NONE Performed at  [facility], [address], [place], [place] [address]    Culture   Final    NO GROWTH Performed at [facility], [place] [address], [place], [place] [address]    Report Status 01/06/2021 FINAL  Final  CULTURE, BLOOD (ROUTINE X 2) w Reflex to ID Panel     Status: None (Preliminary result)   Collection Time: 01/06/21  5:28 AM   Specimen: BLOOD  Result Value Ref Range Status   Specimen Description BLOOD LEFT FA  Final   Special Requests   Final    BOTTLES DRAWN AEROBIC AND ANAEROBIC Blood Culture adequate volume   Culture   Final    NO GROWTH 1 DAY Performed at [facility], [address], [place], [place] [address]    Report Status PENDING  Incomplete  CULTURE, BLOOD (ROUTINE X 2) w Reflex to ID Panel     Status: None (Preliminary result)   Collection Time: 01/06/21  5:29 AM   Specimen: BLOOD  Result Value Ref Range Status   Specimen Description BLOOD LEFT HAND  Final   Special Requests   Final    BOTTLES DRAWN AEROBIC AND ANAEROBIC Blood Culture adequate volume   Culture   Final    NO GROWTH 1 DAY Performed at [facility], [address], [place], [place] [address]    Report Status PENDING  Incomplete      Radiology Studies: DG Chest 2 View  Result Date: 01/06/2021 CLINICAL DATA:  Shortness of breath EXAM: CHEST - 2 VIEW COMPARISON:  01/03/2021 FINDINGS: Small bilateral pleural effusions. Slight increased patchy airspace disease  at the bases. Stable cardiomediastinal silhouette with aortic atherosclerosis. No pneumothorax. IMPRESSION: 1. Small bilateral pleural effusions with increasing atelectasis or infiltrates at the bases Electronically Signed   By: Donavan Foil M.D.   On: 01/06/2021 16:28   DG Abd 1 View  Result Date: 01/07/2021 CLINICAL DATA:  Constipation EXAM: ABDOMEN - 1 VIEW COMPARISON:  None. FINDINGS: Air seen throughout much of the colon apart from rectum. There is no significant stool burden. Increased pelvic density may  reflect bladder or stool within rectum. IMPRESSION: No significant stool burden. Increased pelvic density may reflect bladder or stool within rectum. Electronically Signed   By: Macy Mis M.D.   On: 01/07/2021 15:59   ECHO TEE  Result Date: 01/07/2021    TRANSESOPHOGEAL ECHO REPORT   Patient Name:   Phillip Chavez Date of Exam: 01/06/2021 Medical Rec #:  [id number]      Height:       65.0 in Accession #:    [id number]     Weight:       176.1 lb Date of Birth:  04/02/1940      BSA:          1.874 m Patient Age:    33 years       BP:           120/49 mmHg Patient Gender: M              HR:           79 bpm. Exam Location:  [facility] Procedure: Transesophageal Echo, Color Doppler, Cardiac Doppler and Saline            Contrast Bubble Study Indications:     bacteremia, r/o endocarditis, aortic valve disorder  History:         Patient has prior history of Echocardiogram examinations, most                  recent 01/04/2021. CHF, COPD; Risk Factors:Hypertension and                  Dyslipidemia.  Sonographer:     Sherrie Sport Referring Phys:  [id number] XHFSFSEL A ARIDA Diagnosing Phys: Ida Rogue MD PROCEDURE: After discussion of the risks and benefits of a TEE, an informed consent was obtained from the patient. TEE procedure time was 30 minutes. The transesophogeal probe was passed without difficulty through the esophogus of the patient. Imaged were obtained with the patient in a left lateral decubitus position. Local oropharyngeal anesthetic was provided with Benzocaine spray and Cetacaine. Sedation performed by performing physician. Image quality was excellent. The patient's vital signs; including heart rate, blood pressure, and oxygen saturation; remained stable throughout the procedure. The patient developed no complications during the procedure. IMPRESSIONS  1. No valve endocarditis noted.  2. Left ventricular ejection fraction, by estimation, is 60 to 65%. The left ventricle has normal function. The left ventricle  has no regional wall motion abnormalities.  3. Right ventricular systolic function is normal. The right ventricular size is normal. There is moderately elevated pulmonary artery systolic pressure. The estimated right ventricular systolic pressure is 95.3 mmHg.  4. Left atrial size was moderately dilated. No left atrial/left atrial appendage thrombus was detected.  5. Right atrial size was mildly dilated.  6. The mitral valve is normal in structure. Mild mitral valve regurgitation. No evidence of mitral stenosis.  7. Tricuspid valve regurgitation is moderate to severe.  8. 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  9. 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 No aortic stenosis is present. 10. There is mild to moderate (Grade II-III) plaque involving the descending aorta. 11. Agitated saline contrast bubble study was negative, with no evidence of any interatrial shunt. Conclusion(s)/Recommendation(s): Normal biventricular function without evidence of hemodynamically significant valvular heart disease. FINDINGS  Left Ventricle: Left ventricular ejection fraction, by estimation, is 60 to 65%. The left ventricle has normal function. The left ventricle has no regional wall motion abnormalities. The left ventricular internal cavity size was normal in size. There is  no left ventricular hypertrophy. Right Ventricle: The right ventricular size is normal. No increase in right ventricular wall thickness. Right ventricular systolic function is normal. There is moderately elevated pulmonary artery systolic pressure. The tricuspid regurgitant velocity is 3.41 m/s, and with an assumed right atrial pressure of 10 mmHg, the estimated right ventricular systolic pressure is 60.7 mmHg. Left Atrium: Left atrial size was moderately dilated. No left atrial/left atrial appendage thrombus was detected. Right Atrium: Right  atrial size was mildly dilated. Pericardium: There is no evidence of pericardial effusion. Mitral Valve: The mitral valve is normal in structure. Mild mitral valve regurgitation. No evidence of mitral valve stenosis. Tricuspid Valve: The tricuspid valve is normal in structure. Tricuspid valve regurgitation is moderate to severe. No evidence of tricuspid stenosis. Aortic Valve: The aortic valve is tricuspid. 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 No aortic stenosis is present. Pulmonic Valve: The pulmonic valve was normal in structure. Pulmonic valve regurgitation is not visualized. No evidence of pulmonic stenosis. Aorta: The aortic root is normal in size and structure. There is moderate (Grade III) plaque involving the descending aorta. Venous: The inferior vena cava is normal in size with greater than 50% respiratory variability, suggesting right atrial pressure of 3 mmHg. IAS/Shunts: No atrial level shunt detected by color flow Doppler. Agitated saline contrast was given intravenously to evaluate for intracardiac shunting. Agitated saline contrast bubble study was negative, with no evidence of any interatrial shunt. There  is no evidence of a patent foramen ovale. There is no evidence of an atrial septal defect.  TRICUSPID VALVE TR Peak grad:   46.5 mmHg TR Vmax:        341.00 cm/s Ida Rogue MD Electronically signed by Ida Rogue MD Signature Date/Time: 01/07/2021/11:27:44 AM    Final      Scheduled Meds:  aspirin EC  81 mg Oral Daily   cholecalciferol  1,000 Units Oral Daily   enoxaparin (LOVENOX) injection  40 mg Subcutaneous QHS   ferrous sulfate  325 mg Oral Q breakfast   furosemide  20 mg Oral Daily   [START ON 01/08/2021] ipratropium  0.5 mg Nebulization Q6H WA   [START ON 01/08/2021] levalbuterol  0.63 mg Nebulization Q6H WA   morphine       multivitamin with minerals  1 tablet Oral Daily   omega-3 acid ethyl esters  1 g Oral Daily   pantoprazole  40 mg Oral Daily    predniSONE  40 mg Oral Q breakfast   rosuvastatin  40 mg Oral QHS   Continuous Infusions:  sodium chloride Stopped (01/05/21 1432)   ampicillin (OMNIPEN) IV 2 g (01/07/21 1706)   cefTRIAXone (ROCEPHIN)  IV 2 g (01/07/21 0943)     LOS: 4 days     Enzo Bi, MD [facility] If 7PM-7AM, please contact night-coverage 01/07/2021, 8:09 PM

## 2021-01-07 NOTE — TOC Initial Note (Signed)
Transition of Care [facility]) - Initial/Assessment Note    Patient Details  Name: Phillip Chavez MRN: [id number] Date of Birth: 03-30-1940  Transition of Care [facility]) CM/SW Contact:    Alberteen Sam, LCSW Phone Number: 01/07/2021, 9:10 AM  Clinical Narrative:                  CSW spoke with patient's spouse Rosaletta regarding discharge planning, reports she was present at bedside yesterday when PT visited patient and is in agreement with Home Health recommendation. Reports no hx of using home health and no preference.   Reports patient could utilize a walker. Reports patient has no O2 at home at baseline. CSW informed Rosaletta that we will continue to monitor oxygen needs and if needed will set up when patient is medically stable to discharge.   Confirmed address on chart is accurate.   CSW has sent [facility] PT and RN referral to [place] with [facility], pending acceptance at this time.   Will need rolling walker ordered closer to discharge and assess for oxygen needs.   Expected Discharge Plan: [place] Barriers to Discharge: Continued Medical Work up   Patient Goals and CMS Choice Patient states their goals for this hospitalization and ongoing recovery are:: to go home CMS Medicare.gov Compare Post Acute Care list provided to:: Patient Represenative (must comment) (spouse) Choice offered to / list presented to : Spouse  Expected Discharge Plan and Services Expected Discharge Plan: [place]     Post Acute Care Choice: [place] arrangements for the past 2 months: Single Family Home                 DME Arranged: Walker rolling DME Agency: [employer]       HH Arranged: PT, RN [place] [facility]: [facility] Date HH Agency Contacted: 01/07/21 Time [place]: 0909 Representative spoke with at [place]: Corene Cornea  Prior Living Arrangements/Services Living arrangements for the past 2 months: [place]  with:: Spouse   Do you feel safe going back to the place where you live?: Yes      Need for Family Participation in Patient Care: Yes (Comment) Care giver support system in place?: Yes (comment)      Activities of Daily Living Home Assistive Devices/Equipment: Eyeglasses ADL Screening (condition at time of admission) Patient's cognitive ability adequate to safely complete daily activities?: Yes Is the patient deaf or have difficulty hearing?: Yes Does the patient have difficulty seeing, even when wearing glasses/contacts?: No Does the patient have difficulty concentrating, remembering, or making decisions?: No Patient able to express need for assistance with ADLs?: Yes Does the patient have difficulty dressing or bathing?: No Independently performs ADLs?: Yes (appropriate for developmental age) Does the patient have difficulty walking or climbing stairs?: Yes (Current presentation) Weakness of Legs: Both Weakness of Arms/Hands: None  Permission Sought/Granted                  Emotional Assessment       Orientation: : Oriented to Self, Oriented to Place, Oriented to  Time, Oriented to Situation Alcohol / Substance Use: Not Applicable Psych Involvement: No (comment)  Admission diagnosis:  CAP (community acquired pneumonia) [J18.9] AKI (acute kidney injury) ([place]) [N17.9] Community acquired pneumonia, unspecified laterality [J18.9] Congestive heart failure, unspecified HF chronicity, unspecified heart failure type ([place]) [I50.9] Patient Active Problem List   Diagnosis Date Noted   CAP (community acquired pneumonia) 01/03/2021  Sepsis ([place]) 01/03/2021   COPD (chronic obstructive pulmonary disease) ([place]) 01/03/2021   GERD (gastroesophageal reflux disease) 01/03/2021   HTN (hypertension)    HLD (hyperlipidemia)    CHF exacerbation (HCC)    Acute respiratory failure with hypoxia (HCC)    Elevated troponin    AKI (acute kidney injury) ([place])    Normocytic anemia    PCP:   [facility], [place]:   [place], [place] - [place] [address] Phone: [phone] Fax: [phone]     Social Determinants of Health (SDOH) Interventions    Readmission Risk Interventions No flowsheet data found.

## 2021-01-07 NOTE — Progress Notes (Signed)
Physical Therapy Treatment Patient Details Name: Phillip Chavez MRN: [id number] DOB: [birth year] Today's Date: 01/07/2021   History of Present Illness Phillip Chavez is a n 74yoM coming [facility] ED 01/03/21 c 3d SOB, LEE, 89% on room air. Recent diagnosis of CHF PTA. PMH: CHF, HTN, COPD. Pt recent treated for URI. Workup suggesetive of RML CAP. HS tropnonin elevated, attributed to demand/supply mismatch.    PT Comments    Pt was labored breathing upon arriving on 2 L o2 with sao2 >92%. He was easily able to stand and ambulate while pushing IV pole. Distance greatly limited by SOB/ HR elevation to upper 120s. No LOB. Will continue to progress strength, activity tolerance, and endurance as able per current POC.    Recommendations for follow up therapy are one component of a multi-disciplinary discharge planning process, led by the attending physician.  Recommendations may be updated based on patient status, additional functional criteria and insurance authorization.  Follow Up Recommendations  Home health PT     Assistance Recommended at Discharge PRN  Equipment Recommendations  None recommended by PT       Precautions / Restrictions Precautions Precautions: Fall Restrictions Weight Bearing Restrictions: No     Mobility  Bed Mobility Overal bed mobility: Modified Independent      General bed mobility comments: On 2 L at rest but did need to increase to 4 L to recover after OOB activity/ ambulated ~ 40 ft with pushing IV pole.    Transfers Overall transfer level: Modified independent Equipment used:  (IV pole)     Ambulation/Gait Ambulation/Gait assistance: Supervision Gait Distance (Feet): 40 Feet Assistive device: IV Pole Gait Pattern/deviations: Step-through pattern Gait velocity: decreased     General Gait Details: pt has no LOB however greatly limited by activity tolerance deficits. pt is SOB with minimal activity        Cognition Arousal/Alertness:  Awake/alert Behavior During Therapy: WFL for tasks assessed/performed Overall Cognitive Status: Within Functional Limits for tasks assessed      General Comments: pt is A and O x 4. Is somewhat SOB even at rest               Pertinent Vitals/Pain Pain Assessment: No/denies pain     PT Goals (current goals can now be found in the care plan section) Acute Rehab PT Goals Patient Stated Goal: Get back to how I was Progress towards PT goals: Progressing toward goals    Frequency    Min 2X/week      PT Plan Current plan remains appropriate       AM-PAC PT "6 Clicks" Mobility   Outcome Measure  Help needed turning from your back to your side while in a flat bed without using bedrails?: None Help needed moving from lying on your back to sitting on the side of a flat bed without using bedrails?: None Help needed moving to and from a bed to a chair (including a wheelchair)?: A Little Help needed standing up from a chair using your arms (e.g., wheelchair or bedside chair)?: A Little Help needed to walk in hospital room?: A Little Help needed climbing 3-5 steps with a railing? : A Little 6 Click Score: 20    End of Session Equipment Utilized During Treatment: Oxygen Activity Tolerance: Patient limited by fatigue Patient left: in bed;with call bell/phone within reach;with family/visitor present Nurse Communication: Mobility status PT Visit Diagnosis: Difficulty in walking, not elsewhere classified (R26.2);Unsteadiness on feet (R26.81)     Time:  6759-1638 PT Time Calculation (min) (ACUTE ONLY): 23 min  Charges:  $Gait Training: 8-22 mins $Therapeutic Activity: 8-22 mins                    Phillip Chavez PTA 01/07/21, 12:54 PM

## 2021-01-08 DIAGNOSIS — R7881 Bacteremia: Secondary | ICD-10-CM | POA: Diagnosis not present

## 2021-01-08 DIAGNOSIS — B952 Enterococcus as the cause of diseases classified elsewhere: Secondary | ICD-10-CM | POA: Diagnosis not present

## 2021-01-08 DIAGNOSIS — J9601 Acute respiratory failure with hypoxia: Secondary | ICD-10-CM | POA: Diagnosis not present

## 2021-01-08 DIAGNOSIS — N179 Acute kidney failure, unspecified: Secondary | ICD-10-CM | POA: Diagnosis not present

## 2021-01-08 LAB — BASIC METABOLIC PANEL
Anion gap: 8 (ref 5–15)
BUN: 26 mg/dL — ABNORMAL HIGH (ref 8–23)
CO2: 21 mmol/L — ABNORMAL LOW (ref 22–32)
Calcium: 7.8 mg/dL — ABNORMAL LOW (ref 8.9–10.3)
Chloride: 103 mmol/L (ref 98–111)
Creatinine, Ser: 2.23 mg/dL — ABNORMAL HIGH (ref 0.61–1.24)
GFR, Estimated: 29 mL/min — ABNORMAL LOW (ref >60)
Glucose, Bld: 149 mg/dL — ABNORMAL HIGH (ref 70–99)
Potassium: 4.6 mmol/L (ref 3.5–5.1)
Sodium: 132 mmol/L — ABNORMAL LOW (ref 135–145)

## 2021-01-08 LAB — CBC
HCT: 27.6 % — ABNORMAL LOW (ref 39.0–52.0)
Hemoglobin: 9.1 g/dL — ABNORMAL LOW (ref 13.0–17.0)
MCH: 29.2 pg (ref 26.0–34.0)
MCHC: 33 g/dL (ref 30.0–36.0)
MCV: 88.5 fL (ref 80.0–100.0)
Platelets: 201 10*3/uL (ref 150–400)
RBC: 3.12 MIL/uL — ABNORMAL LOW (ref 4.22–5.81)
RDW: 14.3 % (ref 11.5–15.5)
WBC: 7.8 10*3/uL (ref 4.0–10.5)
nRBC: 0 % (ref 0.0–0.2)

## 2021-01-08 LAB — MAGNESIUM: Magnesium: 2.1 mg/dL (ref 1.7–2.4)

## 2021-01-08 MED ORDER — SODIUM CHLORIDE 0.9 % IV SOLN
2.0000 g | Freq: Three times a day (TID) | INTRAVENOUS | Status: DC
Start: 2021-01-08 — End: 2021-01-09
  Administered 2021-01-08 – 2021-01-09 (×3): 2 g via INTRAVENOUS
  Filled 2021-01-08: qty 2000
  Filled 2021-01-08: qty 2
  Filled 2021-01-08 (×3): qty 2000

## 2021-01-08 MED ORDER — POLYETHYLENE GLYCOL 3350 17 G PO PACK
17.0000 g | PACK | Freq: Two times a day (BID) | ORAL | Status: DC
  Administered 2021-01-08 – 2021-01-24 (×14): 17 g via ORAL
  Filled 2021-01-08 (×21): qty 1

## 2021-01-08 MED ORDER — ENOXAPARIN SODIUM 30 MG/0.3ML IJ SOSY
30.0000 mg | PREFILLED_SYRINGE | Freq: Every day | INTRAMUSCULAR | Status: DC
  Administered 2021-01-08 – 2021-01-09 (×2): 30 mg via SUBCUTANEOUS
  Filled 2021-01-08 (×2): qty 0.3

## 2021-01-08 MED ORDER — METHYLPREDNISOLONE SODIUM SUCC 125 MG IJ SOLR
80.0000 mg | INTRAMUSCULAR | Status: DC
  Administered 2021-01-08: 80 mg via INTRAVENOUS
  Filled 2021-01-08: qty 2

## 2021-01-08 MED ORDER — ENSURE ENLIVE PO LIQD
237.0000 mL | Freq: Two times a day (BID) | ORAL | Status: DC
  Administered 2021-01-08 – 2021-01-12 (×2): 237 mL via ORAL

## 2021-01-08 MED ORDER — MORPHINE SULFATE (PF) 2 MG/ML IV SOLN
1.0000 mg | Freq: Once | INTRAVENOUS | Status: AC
  Administered 2021-01-08: 1 mg via INTRAVENOUS
  Filled 2021-01-08: qty 1

## 2021-01-08 MED ORDER — LACTATED RINGERS IV SOLN: INTRAVENOUS | Status: AC

## 2021-01-08 MED ORDER — METHYLPREDNISOLONE SODIUM SUCC 40 MG IJ SOLR: 40.0000 mg | Freq: Two times a day (BID) | INTRAMUSCULAR | Status: DC

## 2021-01-08 NOTE — Progress Notes (Signed)
PHARMACY NOTE:  ANTIMICROBIAL RENAL DOSAGE ADJUSTMENT  Current antimicrobial regimen includes a mismatch between antimicrobial dosage and estimated renal function.  As per policy approved by the Pharmacy & Therapeutics and Medical Executive Committees, the antimicrobial dosage will be adjusted accordingly.  Current antimicrobial dosage:  Ampicillin 2g q8h  Indication: during r/o for endocarditis ISO E.Faecalis bacteremia  Renal Function:   Estimated Creatinine Clearance: 25 mL/min (A) (by C-G formula based on SCr of 2.23 mg/dL (H)).    Antimicrobial dosage has been changed to:  Ampicillin 2g q8h   Additional comments: Worsening AKI; Scr 1.64>1.48>1.53>2.23 (baseline 1.17) Will keep the more frequent dosing w/in range of available adjustments for this degree of renal fxn based upon indication.  Thank you for allowing pharmacy to be a part of this patient's care.  Lorna Dibble, [facility] 01/08/2021 8:39 AM

## 2021-01-08 NOTE — Progress Notes (Signed)
Per protocol:   IV methylprednisolone will be converted to either a q12h or q24h frequency with the same total daily dose (TDD).   Ordered Dose: 1 to 125 mg TDD; convert to: TDD q24h.  Ordered Dose: 126 to 250 mg TDD; convert to: TDD div q12h.  Ordered Dose: >250 mg TDD; DAW.  Order for methylprednisolone 40mg  every 12 hours has been converted to every 24 hours per protocol.   Pernell Dupre, PharmD, BCPS Clinical Pharmacist 01/08/2021 9:18 AM

## 2021-01-08 NOTE — Progress Notes (Signed)
Date of Admission:  01/03/2021    ID: Phillip Chavez is a 80 y.o. male  Principal Problem:   Acute respiratory failure with hypoxia ([place]) Active Problems:   CAP (community acquired pneumonia)   Sepsis ([place])   COPD (chronic obstructive pulmonary disease) (HCC)   GERD (gastroesophageal reflux disease)   HTN (hypertension)   HLD (hyperlipidemia)   CHF exacerbation (HCC)   Elevated troponin   AKI (acute kidney injury) ([place])   Normocytic anemia   Aortic valve regurgitation  Medical history from [place] record HTN w/ Heart Involvement CAD * (ICD-9-CM 414.9) Low Back Pain * (ICD-9-CM 724.2) Hyperlipidemia * (ICD-9-CM 272.4) Back Pain (ICD-9-CM 724.5) Spine Fracture (ICD-9-CM 805.8) Diplopia * (ICD-9-CM 368.2) Dermatophytosis of nail (ICD-9-CM 110.1) Unspecified Viral Infection (ICD-9-CM 079.99) Sinusitis nasal (ICD-9-CM 473.9) Dyspepsia * (ICD-9-CM 536.8) Rash and other nonspecific skin eruption (ICD-9-CM 782.1) Hematospermia Blood in urine Chronic obstructive lung disease (SNOMED CT 11914782) Obesity Prostatitis * (ICD-9-CM 601.9) Epidermal Cyst Emphysema, Pulmonary * (ICD-9-CM 492.8) Gross Hematuria (ICD-9-CM 599.71) Health Maintenance (ICD-9-CM V65.9) Hypertrophy (Benign) of Prostate without Urinary obstruction Vitamin D Deficiency (ICD-9-CM 268.9) Knee: arthralgia * (ICD-9-CM 719.46) Transient cerebral ischemia (SNOMED CT 956213086) Cerebrovascular accident (SNOMED CT 578469629) Insomnia Hypertensive disorder Dizziness Coronary arteriosclerosis Mixed hyperlipidemia Chronic obstructive lung disease Impaired fasting glycaemia Chronic prostatitis Gastroesophageal reflux disease without esophagitis Cataract Asbestos-induced pleural plaque Subjective: Spoke to wife and son at bed side He is sleeping with BIPAP Has alternating constipation with diarrhea since 3 months No pain abdomen No fever  Medications:   aspirin EC  81 mg Oral Daily   cholecalciferol  1,000  Units Oral Daily   enoxaparin (LOVENOX) injection  30 mg Subcutaneous QHS   feeding supplement  237 mL Oral BID BM   ferrous sulfate  325 mg Oral Q breakfast   ipratropium  0.5 mg Nebulization Q6H WA   levalbuterol  0.63 mg Nebulization Q6H WA   methylPREDNISolone (SOLU-MEDROL) injection  80 mg Intravenous Q24H   multivitamin with minerals  1 tablet Oral Daily   omega-3 acid ethyl esters  1 g Oral Daily   pantoprazole  40 mg Oral Daily   polyethylene glycol  17 g Oral BID   rosuvastatin  40 mg Oral QHS    Objective: Vital signs in last 24 hours: Temp:  [96.2 F (35.7 C)-98.2 F (36.8 C)] 97.6 F (36.4 C) (11/10 1428) Pulse Rate:  [70-104] 104 (11/10 1428) Resp:  [18-20] 20 (11/10 1428) BP: (110-149)/(46-55) 149/55 (11/10 1428) SpO2:  [98 %-100 %] 98 % (11/10 1428) Weight:  [81 kg] 81 kg (11/10 0506)  PHYSICAL EXAM:  General: on BIPAP sleeping Lungs: b/l air entry rhonchi. Heart: Regular rate and rhythm, no murmur, rub or gallop. Abdomen: Soft, non-tender,not distended. Bowel sounds normal. No masses Extremities: atraumatic, no cyanosis. No edema. No clubbing Skin: No rashes or lesions. Or bruising Lymph: Cervical, supraclavicular normal. Neurologic: Grossly non-focal  Lab Results Recent Labs    01/07/21 0443 01/08/21 0450  WBC 11.3* 7.8  HGB 10.2* 9.1*  HCT 31.3* 27.6*  NA 135 132*  K 4.1 4.6  CL 103 103  CO2 22 21*  BUN 15 26*  CREATININE 1.53* 2.23*   Liver Panel Recent Labs    01/07/21 0443  ALBUMIN 2.6*    Microbiology: 01/03/21 BC- enterococcus fecalis 01/06/21 Repeat BC sent UC NG Studies/Results: DG Abd 1 View  Result Date: 01/07/2021 CLINICAL DATA:  Constipation EXAM: ABDOMEN - 1 VIEW COMPARISON:  None. FINDINGS: Air seen  throughout much of the colon apart from rectum. There is no significant stool burden. Increased pelvic density may reflect bladder or stool within rectum. IMPRESSION: No significant stool burden. Increased pelvic density may  reflect bladder or stool within rectum. Electronically Signed   By: Macy Mis M.D.   On: 01/07/2021 15:59      Increased pelvic density may reflect bladder or stool within rectum.   Assessment/Plan: ?pt presenting with sob and cough for the past month- intially started with fever and was diagnosed as a viral infection a month ago at [place]  Now has SOB on exertion, orthopnea PND HE has underlying emphysema Asbestos pleural plaque    Enterococcus bacteremia- with symptoms of new onset CHF need to r/o endocarditis No hardware, no pacemaker or prosthetic valves Pt had 2 d echo- valves no vegetation but has aortic stenosis TEE done today did not show vegetation .   Source of enterococcus unclear ( it is a gi pathogen) UC Neg post void bladder scan is 365- will need to repeat Pt is on ampicillin and ceftriaxone being treated like endocarditis but as TEE no vegetation and repeat blood culture is neg will DC ceftriaxone and continue IV ampicillin for a total of 2 weeks  Has had compression fracture thoracic spine and pain back and neck- MRI thoracic and cervical spine shows no discitis /infection though limited by absent contrast  Pt has constipation alternating with  diarrhea ( more constipation) since Aug 2022 CT ABD/PELVIS WO CONTRAST done on 10/18/20 showed 1. Large amount of fecal content within the rectal vault may be consistent with constipation in the correct clinical setting. 2. Wall thickening of the urinary bladder. 3. Prominence of the prostate gland. 4. Cholelithiasis.  Stool wbc negative- so diarrhea is not related to any inflammatory cause        Acute hypoxia CHF-/COPD  On solumedrol   AKI-    Anemia-   Discussed the management with wife, son and hospitalist

## 2021-01-08 NOTE — Progress Notes (Signed)
PROGRESS NOTE    Phillip Chavez  EPP:[id number] DOB: 1940/12/24 DOA: 01/03/2021 PCP: [facility] [place]  [id number]/[id number]-[id number]   Assessment & Plan:   Principal Problem:   Acute respiratory failure with hypoxia ([place]) Active Problems:   CAP (community acquired pneumonia)   Sepsis ([place])   COPD (chronic obstructive pulmonary disease) ([place])   GERD (gastroesophageal reflux disease)   HTN (hypertension)   HLD (hyperlipidemia)   CHF exacerbation (H[facility])   Elevated troponin   AKI (acute kidney injury) ([place])   Normocytic anemia   Aortic valve regurgitation   Phillip Chavez is a 80 y.o. male with medical history significant of hypertension, hyperlipidemia, COPD not on oxygen at home, GERD, recently diagnosed CHF in [place], who presents with shortness of breath.   Patient states that he has been having upper respiratory symptoms of for almost a month. He was initially seen at the [place] and diagnosed with viral upper respiratory tract infection.  This lasted for about 10 days and he seemed to get somewhat better but then symptoms returned in past week.  He has shortness of breath, productive cough with greenish colored sputum production. Patient has fever and chills.  Patient also has worsening bilateral lower extremity edema and orthopnea.  Per patient he was recently diagnosed with CHF but no echo done yet.  He was also hypoxic at 89% on arrival to ED which improved to high 90s on 2 L of oxygen.  He met sepsis criteria with temperature of 100.4, leukocytosis and chest x-ray concerning for right middle lobe infiltrate and streaky bilateral basilar opacities.  AKI with creatinine of 1.64, no recent baseline, prior creatinine in the system was 1.17 in December 2014. Preliminary blood cultures with Enterococcus faecalis. Initially started on Levaquin which were switched to Unasyn . Echo with normal EF, no regional wall motion abnormalities, normal diastolic function and mild aortic stenosis and no vegetations  noted.   ID was consulted for Enterococcus bacteremia, they added ceftriaxone empirically for endocarditis until patient had TEE to rule it out completely.  Cardiology consulted for TEE.   Patient was also having neck and back pain, MRI of cervical, thoracic and lumbar spine ordered to rule out any discitis or osteomyelitis, unable to complete MRI of lumbar spine, pain is mostly in neck and upper back, negative for any osteomyelitis or discitis but did show some foraminal narrowing at cervical level which can be taken care as an outpatient.  TEE neg for vegetation.   Sepsis secondary to CAP and Enterococcus faecalis bacteremia.   Met sepsis criteria with fever, leukocytosis and concern of pneumonia, later blood cultures came back positive for Enterococcus faecalis.    Enterococcus bacteremia Patient received 1 dose of vancomycin and Rocephin in ED and then started on Levaquin. Levaquin was switched with Unasyn  based on preliminary blood cultures.  D added ceftriaxone empirically for endocarditis Unasyn was switched with ampicillin by ID. Preliminary repeat blood cultures negative so far.   --TEE neg for vegetation, ceftriaxone d/c'ed. Plan: --cont ampicillin for a total of 2 weeks   Acute respiratory failure with hypoxia secondary to  pneumonia  Mod to severe aortic regurg COPD exacerbation --Patient was hypoxic in high 80s on room air requiring up to 2 L of oxygen.  No baseline oxygen requirement.   --start tx for COPD exacerbation on 11/9. --Continue supplemental O2 to keep sats between 88-92%, wean as tolerated  COPD exacerbation --severe dyspnea, increased cough, and apparent broncho-spasm --started on prednisone 40 mg  daily --switch to IV solumedrol --BiPAP PRN  --Xopenx and Atrovent q6h  AKI --Cr went up to 2.23 today, from 1.53 yesterday.  Likely due dehydration. --LR'@100'  for 10 hours --Hold diuretic  Mod to severe aortic regurg --hold oral lasix due to  AKI  PNA --received 5 days of ceftriaxone  Elevated troponin.   Peaked at 147 and now trending down.  Likely secondary to demand ischemia with CHF exacerbation.  No chest pain.   Hypertension.   BP trending up --hold home Cozaar and lasix 2/2 AKI --start Lopressor -IV hydralazine as needed   Normocytic anemia:  Hemoglobin 10.6>>9.7.  Anemia panel with anemia of chronic disease and some iron deficiency.  FOBT negative. --cont iron supplement (new) --monitor Hgb --rec outpatient colonoscopy   GERD (gastroesophageal reflux disease) -Protonix   HLD (hyperlipidemia) -Crestor   Generalized weakness. -PT/OT evaluation   DVT prophylaxis: Lovenox SQ Code Status: Full code  Family Communication: wife updated at bedside today  Level of care: Progressive Cardiac Dispo:   The patient is from: home Anticipated d/c is to: home Anticipated d/c date is: 2-3 days Patient currently is not medically ready to d/c due to: severe dyspnea, needs BiPAP    Subjective and Interval History:  Pt preferred to stay on BiPAP and had to be encouraged to take it off to eat his meals.  Pt was seen without his BiPAP and appeared calm and comfortable.   Objective: Vitals:   01/08/21 1137 01/08/21 1153 01/08/21 1358 01/08/21 1428  BP: (!) 116/55   (!) 149/55  Pulse: 87 89  (!) 104  Resp: '20 19  20  ' Temp: (!) 97.5 F (36.4 C)   97.6 F (36.4 C)  TempSrc:      SpO2: 100%  99% 98%  Weight:      Height:        Intake/Output Summary (Last 24 hours) at 01/08/2021 1617 Last data filed at 01/08/2021 1330 Gross per 24 hour  Intake 240 ml  Output --  Net 240 ml   Filed Weights   01/03/21 0603 01/06/21 0333 01/08/21 0506  Weight: 78 kg 79.9 kg 81 kg    Examination:   Constitutional: NAD, AAOx3 HEENT: conjunctivae and lids normal, EOMI CV: No cyanosis.   RESP: normal respiratory effort, no crackles, wheezes Extremities: No effusions, edema in BLE SKIN: warm, dry Neuro: II - XII grossly  intact.     Data Reviewed: I have personally reviewed following labs and imaging studies  CBC: Recent Labs  Lab 01/03/21 0605 01/05/21 0522 01/07/21 0443 01/08/21 0450  WBC 12.1* 8.1 11.3* 7.8  NEUTROABS 9.3*  --   --   --   HGB 10.6* 9.7* 10.2* 9.1*  HCT 32.2* 29.4* 31.3* 27.6*  MCV 86.8 88.3 88.4 88.5  PLT 245 204 244 122   Basic Metabolic Panel: Recent Labs  Lab 01/03/21 1046 01/04/21 0544 01/05/21 0522 01/07/21 0443 01/08/21 0450  NA 131* 133* 133* 135 132*  K 3.8 4.3 4.1 4.1 4.6  CL 100 104 103 103 103  CO2 22 21* 22 22 21*  GLUCOSE 132* 111* 194* 106* 149*  BUN '20 20 17 15 ' 26*  CREATININE 1.64* 1.44* 1.48* 1.53* 2.23*  CALCIUM 8.0* 8.0* 8.1* 8.2* 7.8*  MG  --  2.3  --   --  2.1  PHOS  --   --  3.7 4.1  --    GFR: Estimated Creatinine Clearance: 25 mL/min (A) (by C-G formula based on SCr of 2.23  mg/dL (H)). Liver Function Tests: Recent Labs  Lab 01/03/21 1046 01/05/21 0522 01/07/21 0443  AST 66*  --   --   ALT 85*  --   --   ALKPHOS 87  --   --   BILITOT 1.3*  --   --   PROT 6.7  --   --   ALBUMIN 2.5* 2.3* 2.6*   No results for input(s): LIPASE, AMYLASE in the last 168 hours. No results for input(s): AMMONIA in the last 168 hours. Coagulation Profile: No results for input(s): INR, PROTIME in the last 168 hours. Cardiac Enzymes: No results for input(s): CKTOTAL, CKMB, CKMBINDEX, TROPONINI in the last 168 hours. BNP (last 3 results) No results for input(s): PROBNP in the last 8760 hours. HbA1C: No results for input(s): HGBA1C in the last 72 hours.  CBG: No results for input(s): GLUCAP in the last 168 hours. Lipid Profile: No results for input(s): CHOL, HDL, LDLCALC, TRIG, CHOLHDL, LDLDIRECT in the last 72 hours. Thyroid Function Tests: No results for input(s): TSH, T4TOTAL, FREET4, T3FREE, THYROIDAB in the last 72 hours. Anemia Panel: No results for input(s): VITAMINB12, FOLATE, FERRITIN, TIBC, IRON, RETICCTPCT in the last 72 hours.  Sepsis  Labs: Recent Labs  Lab 01/03/21 0917 01/03/21 1046  PROCALCITON  --  0.18  LATICACIDVEN 1.0  --     Recent Results (from the past 240 hour(s))  Resp Panel by RT-PCR (Flu A&B, Covid) Nasopharyngeal Swab     Status: None   Collection Time: 01/03/21  6:06 AM   Specimen: Nasopharyngeal Swab; Nasopharyngeal(NP) swabs in vial transport medium  Result Value Ref Range Status   SARS Coronavirus 2 by RT PCR NEGATIVE NEGATIVE Final    Comment: (NOTE) SARS-CoV-2 target nucleic acids are NOT DETECTED.  The SARS-CoV-2 RNA is generally detectable in upper respiratory specimens during the acute phase of infection. The lowest concentration of SARS-CoV-2 viral copies this assay can detect is 138 copies/mL. A negative result does not preclude SARS-Cov-2 infection and should not be used as the sole basis for treatment or other patient management decisions. A negative result may occur with  improper specimen collection/handling, submission of specimen other than nasopharyngeal swab, presence of viral mutation(s) within the areas targeted by this assay, and inadequate number of viral copies(<138 copies/mL). A negative result must be combined with clinical observations, patient history, and epidemiological information. The expected result is Negative.  Fact Sheet for Patients:  EntrepreneurPulse.com.au  Fact Sheet for Healthcare Providers:  IncredibleEmployment.be  This test is no t yet approved or cleared by the [place] FDA and  has been authorized for detection and/or diagnosis of SARS-CoV-2 by FDA under an Emergency Use Authorization (EUA). This EUA will remain  in effect (meaning this test can be used) for the duration of the COVID-19 declaration under Section 564(b)(1) of the Act, 21 U.S.C.section 360bbb-3(b)(1), unless the authorization is terminated  or revoked sooner.       Influenza A by PCR NEGATIVE NEGATIVE Final   Influenza B by PCR  NEGATIVE NEGATIVE Final    Comment: (NOTE) The Xpert Xpress SARS-CoV-2/FLU/RSV plus assay is intended as an aid in the diagnosis of influenza from Nasopharyngeal swab specimens and should not be used as a sole basis for treatment. Nasal washings and aspirates are unacceptable for Xpert Xpress SARS-CoV-2/FLU/RSV testing.  Fact Sheet for Patients: EntrepreneurPulse.com.au  Fact Sheet for Healthcare Providers: IncredibleEmployment.be  This test is not yet approved or cleared by the [place] and has been authorized for  detection and/or diagnosis of SARS-CoV-2 by FDA under an Emergency Use Authorization (EUA). This EUA will remain in effect (meaning this test can be used) for the duration of the COVID-19 declaration under Section 564(b)(1) of the Act, 21 U.S.C. section 360bbb-3(b)(1), unless the authorization is terminated or revoked.  Performed at [facility], [address] [place], [place] [address]   Blood culture (routine x 2)     Status: Abnormal   Collection Time: 01/03/21  9:17 AM   Specimen: BLOOD  Result Value Ref Range Status   Specimen Description   Final    BLOOD LEFT ANTECUBITAL Performed at [facility], [address] [place], [place] [address]    Special Requests   Final    BOTTLES DRAWN AEROBIC AND ANAEROBIC Blood Culture adequate volume Performed at [facility], [address] [place], [place] [address]    Culture  Setup Time   Final    GRAM POSITIVE COCCI IN BOTH AEROBIC AND ANAEROBIC BOTTLES CRITICAL RESULT CALLED TO, READ BACK BY AND VERIFIED WITH: NATHAN BELUE AT 9381 01/04/21.PMF Performed at [facility], [address] [place], [place] [address]    Culture (A)  Final    ENTEROCOCCUS FAECALIS SUSCEPTIBILITIES PERFORMED ON PREVIOUS CULTURE WITHIN THE LAST 5 DAYS. Performed at [facility], [address] [place], [place] [address]    Report Status  01/06/2021 FINAL  Final  Blood culture (routine x 2)     Status: Abnormal   Collection Time: 01/03/21  9:17 AM   Specimen: BLOOD LEFT HAND  Result Value Ref Range Status   Specimen Description   Final    BLOOD LEFT HAND Performed at [facility], [address] [place], [place] [address]    Special Requests   Final    BOTTLES DRAWN AEROBIC AND ANAEROBIC Blood Culture adequate volume Performed at [facility], [address] [place], [place] [address]    Culture  Setup Time   Final    Organism ID to follow GRAM POSITIVE COCCI IN BOTH AEROBIC AND ANAEROBIC BOTTLES CRITICAL RESULT CALLED TO, READ BACK BY AND VERIFIED WITH: NATHAN BELUE AT 6144 01/04/21.PMF Performed at [facility], [address] [place], [place] [address]    Culture ENTEROCOCCUS FAECALIS (A)  Final   Report Status 01/06/2021 FINAL  Final   Organism ID, Bacteria ENTEROCOCCUS FAECALIS  Final      Susceptibility   Enterococcus faecalis - MIC*    AMPICILLIN <=2 SENSITIVE Sensitive     VANCOMYCIN 1 SENSITIVE Sensitive     GENTAMICIN SYNERGY SENSITIVE Sensitive     * ENTEROCOCCUS FAECALIS  Blood Culture ID Panel (Reflexed)     Status: Abnormal   Collection Time: 01/03/21  9:17 AM  Result Value Ref Range Status   Enterococcus faecalis DETECTED (A) NOT DETECTED Final    Comment: CRITICAL RESULT CALLED TO, READ BACK BY AND VERIFIED WITH: NATHAN BELUE AT 0867 01/04/21.PMF    Enterococcus Faecium NOT DETECTED NOT DETECTED Final   Listeria monocytogenes NOT DETECTED NOT DETECTED Final   Staphylococcus species NOT DETECTED NOT DETECTED Final   Staphylococcus aureus (BCID) NOT DETECTED NOT DETECTED Final   Staphylococcus epidermidis NOT DETECTED NOT DETECTED Final   Staphylococcus lugdunensis NOT DETECTED NOT DETECTED Final   Streptococcus species NOT DETECTED NOT DETECTED Final   Streptococcus agalactiae NOT DETECTED NOT DETECTED Final   Streptococcus pneumoniae NOT DETECTED NOT DETECTED  Final   Streptococcus pyogenes NOT DETECTED NOT DETECTED  Final   A.calcoaceticus-baumannii NOT DETECTED NOT DETECTED Final   Bacteroides fragilis NOT DETECTED NOT DETECTED Final   Enterobacterales NOT DETECTED NOT DETECTED Final   Enterobacter cloacae complex NOT DETECTED NOT DETECTED Final   Escherichia coli NOT DETECTED NOT DETECTED Final   Klebsiella aerogenes NOT DETECTED NOT DETECTED Final   Klebsiella oxytoca NOT DETECTED NOT DETECTED Final   Klebsiella pneumoniae NOT DETECTED NOT DETECTED Final   Proteus species NOT DETECTED NOT DETECTED Final   Salmonella species NOT DETECTED NOT DETECTED Final   Serratia marcescens NOT DETECTED NOT DETECTED Final   Haemophilus influenzae NOT DETECTED NOT DETECTED Final   Neisseria meningitidis NOT DETECTED NOT DETECTED Final   Pseudomonas aeruginosa NOT DETECTED NOT DETECTED Final   Stenotrophomonas maltophilia NOT DETECTED NOT DETECTED Final   Candida albicans NOT DETECTED NOT DETECTED Final   Candida auris NOT DETECTED NOT DETECTED Final   Candida glabrata NOT DETECTED NOT DETECTED Final   Candida krusei NOT DETECTED NOT DETECTED Final   Candida parapsilosis NOT DETECTED NOT DETECTED Final   Candida tropicalis NOT DETECTED NOT DETECTED Final   Cryptococcus neoformans/gattii NOT DETECTED NOT DETECTED Final   Vancomycin resistance NOT DETECTED NOT DETECTED Final    Comment: Performed at [facility], [address], [place], [place] [address]  Urine Culture     Status: None   Collection Time: 01/04/21  5:21 PM   Specimen: Urine, Clean Catch  Result Value Ref Range Status   Specimen Description   Final    URINE, CLEAN CATCH Performed at [facility], [address], [place], [place] [address]    Special Requests   Final    NONE Performed at [facility], [address] [place], [place] [address]    Culture   Final    NO GROWTH Performed at [facility], [place] [address], [place], [place] [address]     Report Status 01/06/2021 FINAL  Final  CULTURE, BLOOD (ROUTINE X 2) w Reflex to ID Panel     Status: None (Preliminary result)   Collection Time: 01/06/21  5:28 AM   Specimen: BLOOD  Result Value Ref Range Status   Specimen Description BLOOD LEFT FA  Final   Special Requests   Final    BOTTLES DRAWN AEROBIC AND ANAEROBIC Blood Culture adequate volume   Culture   Final    NO GROWTH 2 DAYS Performed at [facility], [address] [place], [place] [address]    Report Status PENDING  Incomplete  CULTURE, BLOOD (ROUTINE X 2) w Reflex to ID Panel     Status: None (Preliminary result)   Collection Time: 01/06/21  5:29 AM   Specimen: BLOOD  Result Value Ref Range Status   Specimen Description BLOOD LEFT HAND  Final   Special Requests   Final    BOTTLES DRAWN AEROBIC AND ANAEROBIC Blood Culture adequate volume   Culture   Final    NO GROWTH 2 DAYS Performed at [facility], [address] [place],  [address]    Report Status PENDING  Incomplete      Radiology Studies: DG Chest 2 View  Result Date: 01/06/2021 CLINICAL DATA:  Shortness of breath EXAM: CHEST - 2 VIEW COMPARISON:  01/03/2021 FINDINGS: Small bilateral pleural effusions. Slight increased patchy airspace disease at the bases. Stable cardiomediastinal silhouette with aortic atherosclerosis. No pneumothorax. IMPRESSION: 1. Small bilateral pleural effusions with increasing atelectasis or infiltrates at the bases Electronically Signed  By: Donavan Foil M.D.   On: 01/06/2021 16:28   DG Abd 1 View  Result Date: 01/07/2021 CLINICAL DATA:  Constipation EXAM: ABDOMEN - 1 VIEW COMPARISON:  None. FINDINGS: Air seen throughout much of the colon apart from rectum. There is no significant stool burden. Increased pelvic density may reflect bladder or stool within rectum. IMPRESSION: No significant stool burden. Increased pelvic density may reflect bladder or stool within rectum. Electronically Signed   By:  Macy Mis M.D.   On: 01/07/2021 15:59     Scheduled Meds:  aspirin EC  81 mg Oral Daily   cholecalciferol  1,000 Units Oral Daily   enoxaparin (LOVENOX) injection  30 mg Subcutaneous QHS   feeding supplement  237 mL Oral BID BM   ferrous sulfate  325 mg Oral Q breakfast   ipratropium  0.5 mg Nebulization Q6H WA   levalbuterol  0.63 mg Nebulization Q6H WA   methylPREDNISolone (SOLU-MEDROL) injection  80 mg Intravenous Q24H   multivitamin with minerals  1 tablet Oral Daily   omega-3 acid ethyl esters  1 g Oral Daily   pantoprazole  40 mg Oral Daily   polyethylene glycol  17 g Oral BID   rosuvastatin  40 mg Oral QHS   Continuous Infusions:  sodium chloride Stopped (01/05/21 1432)   ampicillin (OMNIPEN) IV 2 g (01/08/21 1428)   lactated ringers 100 mL/hr at 01/08/21 1012     LOS: 5 days     Enzo Bi, MD [facility] If 7PM-7AM, please contact night-coverage 01/08/2021, 4:17 PM

## 2021-01-08 NOTE — Progress Notes (Signed)
PHARMACIST - PHYSICIAN COMMUNICATION  CONCERNING:  Enoxaparin (Lovenox) for DVT Prophylaxis   RECOMMENDATION: Patient was prescribed enoxaprin 40mg  q24 hours for VTE prophylaxis.   Filed Weights   01/03/21 0603 01/06/21 0333 01/08/21 0506  Weight: 78 kg (172 lb) 79.9 kg (176 lb 2.4 oz) 81 kg (178 lb 9.2 oz)   Body mass index is 29.72 kg/m.  Estimated Creatinine Clearance: 25 mL/min (A) (by C-G formula based on SCr of 2.23 mg/dL (H)).  Based on [place] patient is candidate for enoxaparin 30mg  every 24 hours based on CrCl <16ml/min. ISO Worsening AKI; Scr 1.64>1.48>1.53>2.23 (baseline 1.17)  DESCRIPTION: Pharmacy has adjusted enoxaparin dose per [facility] policy.  Patient is now receiving enoxaparin 30 mg every 24 hours   Lorna Dibble, PharmD,BCCP Clinical Pharmacist  01/08/2021 8:44 AM

## 2021-01-09 ENCOUNTER — Inpatient Hospital Stay: Payer: No Typology Code available for payment source

## 2021-01-09 DIAGNOSIS — J9601 Acute respiratory failure with hypoxia: Secondary | ICD-10-CM

## 2021-01-09 DIAGNOSIS — R7881 Bacteremia: Secondary | ICD-10-CM | POA: Diagnosis not present

## 2021-01-09 DIAGNOSIS — B952 Enterococcus as the cause of diseases classified elsewhere: Secondary | ICD-10-CM | POA: Diagnosis not present

## 2021-01-09 LAB — URINALYSIS, ROUTINE W REFLEX MICROSCOPIC
Bilirubin Urine: NEGATIVE
Glucose, UA: 50 mg/dL — AB
Ketones, ur: 5 mg/dL — AB
Leukocytes,Ua: NEGATIVE
Nitrite: NEGATIVE
Protein, ur: 30 mg/dL — AB
Specific Gravity, Urine: 1.023 (ref 1.005–1.030)
pH: 5 (ref 5.0–8.0)

## 2021-01-09 LAB — BASIC METABOLIC PANEL
Anion gap: 10 (ref 5–15)
BUN: 46 mg/dL — ABNORMAL HIGH (ref 8–23)
CO2: 22 mmol/L (ref 22–32)
Calcium: 8.3 mg/dL — ABNORMAL LOW (ref 8.9–10.3)
Chloride: 103 mmol/L (ref 98–111)
Creatinine, Ser: 3.48 mg/dL — ABNORMAL HIGH (ref 0.61–1.24)
GFR, Estimated: 17 mL/min — ABNORMAL LOW (ref >60)
Glucose, Bld: 140 mg/dL — ABNORMAL HIGH (ref 70–99)
Potassium: 4.7 mmol/L (ref 3.5–5.1)
Sodium: 135 mmol/L (ref 135–145)

## 2021-01-09 LAB — CBC
HCT: 29.5 % — ABNORMAL LOW (ref 39.0–52.0)
Hemoglobin: 9.6 g/dL — ABNORMAL LOW (ref 13.0–17.0)
MCH: 28.1 pg (ref 26.0–34.0)
MCHC: 32.5 g/dL (ref 30.0–36.0)
MCV: 86.3 fL (ref 80.0–100.0)
Platelets: 244 10*3/uL (ref 150–400)
RBC: 3.42 MIL/uL — ABNORMAL LOW (ref 4.22–5.81)
RDW: 14.6 % (ref 11.5–15.5)
WBC: 10.1 10*3/uL (ref 4.0–10.5)
nRBC: 0 % (ref 0.0–0.2)

## 2021-01-09 LAB — MAGNESIUM: Magnesium: 2.4 mg/dL (ref 1.7–2.4)

## 2021-01-09 LAB — GLUCOSE, CAPILLARY: Glucose-Capillary: 267 mg/dL — ABNORMAL HIGH (ref 70–99)

## 2021-01-09 LAB — NA AND K (SODIUM & POTASSIUM), RAND UR
Potassium Urine: 41 mmol/L
Sodium, Ur: 20 mmol/L

## 2021-01-09 LAB — TSH: TSH: 1.375 u[IU]/mL (ref 0.350–4.500)

## 2021-01-09 LAB — PROCALCITONIN: Procalcitonin: 0.5 ng/mL

## 2021-01-09 LAB — PROTEIN / CREATININE RATIO, URINE
Creatinine, Urine: 205 mg/dL
Protein Creatinine Ratio: 0.29 mg/mg{Cre} — ABNORMAL HIGH (ref 0.00–0.15)
Total Protein, Urine: 59 mg/dL

## 2021-01-09 LAB — MRSA NEXT GEN BY PCR, NASAL: MRSA by PCR Next Gen: NOT DETECTED

## 2021-01-09 LAB — BRAIN NATRIURETIC PEPTIDE: B Natriuretic Peptide: 3236.3 pg/mL — ABNORMAL HIGH (ref 0.0–100.0)

## 2021-01-09 IMAGING — DX DG CHEST 1V PORT
1 series · 1 of 1 positions shown · non-contrast
Comparison: [DATE].

CLINICAL DATA: Acute respiratory failure with hypoxia.

EXAM:
PORTABLE CHEST 1 VIEW

[chest ap]
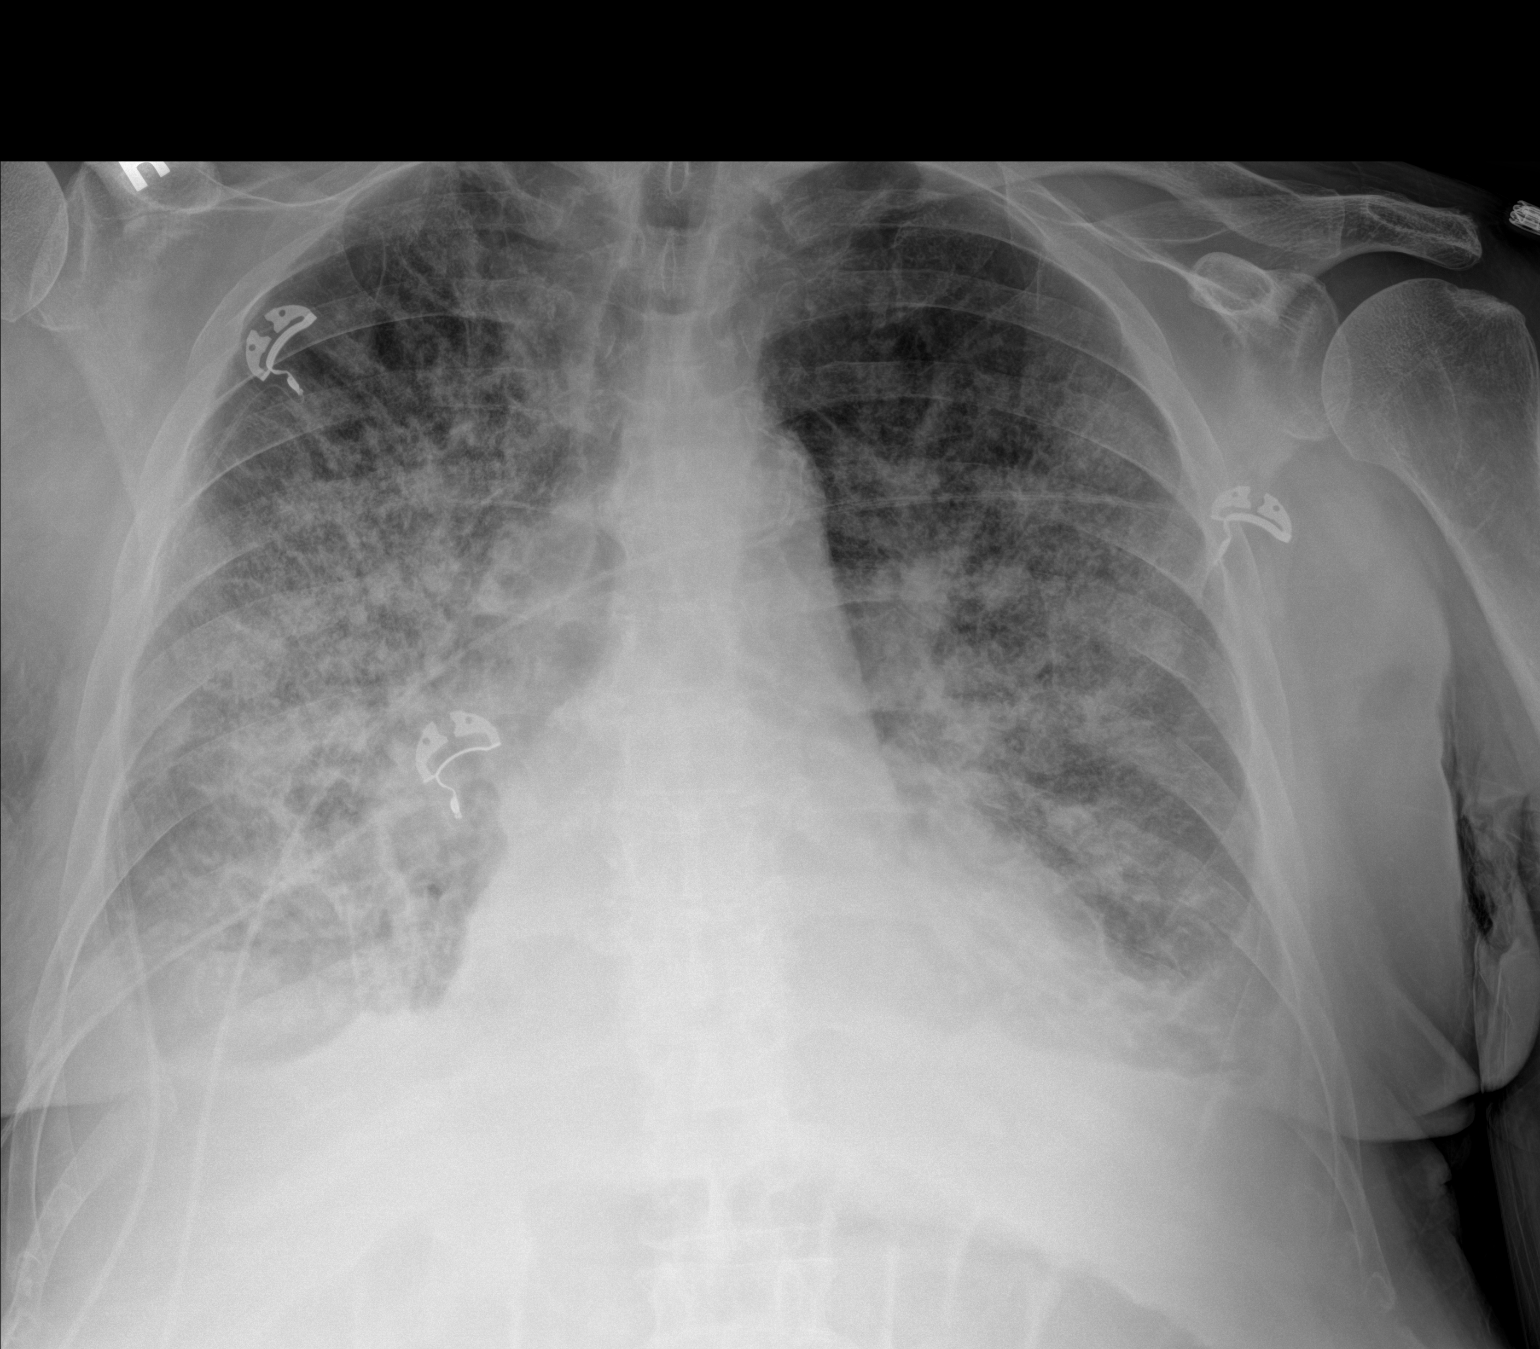

[1 of 1 positions shown; findings below may reference images not displayed]

FINDINGS: The heart size and mediastinal contours are within normal limits.
Significantly increased bilateral lung opacities are noted
concerning for worsening pneumonia or edema. Small bilateral pleural
effusions are noted. The visualized skeletal structures are
unremarkable.
IMPRESSION: Increased bilateral lung opacities are noted concerning for
worsening pneumonia or edema.

## 2021-01-09 MED ORDER — LORAZEPAM 2 MG/ML IJ SOLN
INTRAMUSCULAR | Status: AC
  Filled 2021-01-09: qty 1

## 2021-01-09 MED ORDER — IPRATROPIUM BROMIDE 0.02 % IN SOLN
0.5000 mg | Freq: Three times a day (TID) | RESPIRATORY_TRACT | Status: DC
  Administered 2021-01-09 – 2021-01-10 (×5): 0.5 mg via RESPIRATORY_TRACT
  Filled 2021-01-09 (×6): qty 2.5

## 2021-01-09 MED ORDER — PIPERACILLIN-TAZOBACTAM IN DEX 2-0.25 GM/50ML IV SOLN
2.2500 g | Freq: Four times a day (QID) | INTRAVENOUS | Status: DC
Start: 2021-01-10 — End: 2021-01-10
  Administered 2021-01-10 (×2): 2.25 g via INTRAVENOUS
  Filled 2021-01-09 (×3): qty 50

## 2021-01-09 MED ORDER — LABETALOL HCL 5 MG/ML IV SOLN
10.0000 mg | Freq: Once | INTRAVENOUS | Status: AC
Start: 2021-01-09 — End: 2021-01-09
  Administered 2021-01-09: 10 mg via INTRAVENOUS
  Filled 2021-01-09: qty 4

## 2021-01-09 MED ORDER — METHYLPREDNISOLONE SODIUM SUCC 40 MG IJ SOLR
40.0000 mg | Freq: Once | INTRAMUSCULAR | Status: AC
  Administered 2021-01-09: 40 mg via INTRAVENOUS
  Filled 2021-01-09: qty 1

## 2021-01-09 MED ORDER — SODIUM CHLORIDE 0.9 % IV SOLN
2.0000 g | INTRAVENOUS | Status: DC
  Administered 2021-01-09: 2 g via INTRAVENOUS
  Filled 2021-01-09: qty 2

## 2021-01-09 MED ORDER — SODIUM CHLORIDE 0.9 % IV SOLN
2.0000 g | Freq: Two times a day (BID) | INTRAVENOUS | Status: DC
  Filled 2021-01-09: qty 2000

## 2021-01-09 MED ORDER — FUROSEMIDE 10 MG/ML IJ SOLN
80.0000 mg | Freq: Once | INTRAMUSCULAR | Status: AC
  Administered 2021-01-09: 80 mg via INTRAVENOUS
  Filled 2021-01-09: qty 8

## 2021-01-09 MED ORDER — LORAZEPAM 2 MG/ML IJ SOLN
1.0000 mg | Freq: Once | INTRAMUSCULAR | Status: AC
  Administered 2021-01-09: 1 mg via INTRAVENOUS

## 2021-01-09 MED ORDER — CHLORHEXIDINE GLUCONATE CLOTH 2 % EX PADS
6.0000 | MEDICATED_PAD | Freq: Every day | CUTANEOUS | Status: DC
  Administered 2021-01-09 – 2021-01-24 (×12): 6 via TOPICAL

## 2021-01-09 MED ORDER — LEVALBUTEROL HCL 0.63 MG/3ML IN NEBU
0.6300 mg | INHALATION_SOLUTION | Freq: Three times a day (TID) | RESPIRATORY_TRACT | Status: DC
  Administered 2021-01-09 – 2021-01-10 (×5): 0.63 mg via RESPIRATORY_TRACT
  Filled 2021-01-09 (×6): qty 3

## 2021-01-09 MED ORDER — MORPHINE SULFATE (PF) 2 MG/ML IV SOLN
2.0000 mg | INTRAVENOUS | Status: DC | PRN
  Administered 2021-01-09 – 2021-01-12 (×10): 2 mg via INTRAVENOUS
  Filled 2021-01-09 (×12): qty 1

## 2021-01-09 MED ORDER — MORPHINE SULFATE (PF) 2 MG/ML IV SOLN
1.0000 mg | Freq: Once | INTRAVENOUS | Status: AC
  Administered 2021-01-09: 1 mg via INTRAVENOUS
  Filled 2021-01-09: qty 1

## 2021-01-09 MED ORDER — VANCOMYCIN VARIABLE DOSE PER UNSTABLE RENAL FUNCTION (PHARMACIST DOSING): Status: DC

## 2021-01-09 MED ORDER — VANCOMYCIN HCL 2000 MG/400ML IV SOLN
2000.0000 mg | Freq: Once | INTRAVENOUS | Status: AC
  Administered 2021-01-09: 2000 mg via INTRAVENOUS
  Filled 2021-01-09: qty 400

## 2021-01-09 MED ORDER — METOPROLOL TARTRATE 25 MG PO TABS
25.0000 mg | ORAL_TABLET | Freq: Two times a day (BID) | ORAL | Status: DC
  Administered 2021-01-10 – 2021-01-12 (×4): 25 mg via ORAL
  Filled 2021-01-09 (×5): qty 1

## 2021-01-09 MED ORDER — DEXTROSE 5 % IV SOLN
8.0000 mg/h | INTRAVENOUS | Status: DC
  Administered 2021-01-09: 8 mg/h via INTRAVENOUS
  Filled 2021-01-09 (×2): qty 20

## 2021-01-09 MED ORDER — METHYLPREDNISOLONE SODIUM SUCC 40 MG IJ SOLR
40.0000 mg | Freq: Two times a day (BID) | INTRAMUSCULAR | Status: DC
  Administered 2021-01-09 – 2021-01-10 (×4): 40 mg via INTRAVENOUS
  Filled 2021-01-09 (×4): qty 1

## 2021-01-09 NOTE — Progress Notes (Signed)
PHARMACY NOTE:  ANTIMICROBIAL RENAL DOSAGE ADJUSTMENT  Current antimicrobial regimen includes a mismatch between antimicrobial dosage and estimated renal function.  As per policy approved by the Pharmacy & Therapeutics and Medical Executive Committees, the antimicrobial dosage will be adjusted accordingly.  Current antimicrobial dosage:  Ampicillin 2g q8h  Indication: E.Faecalis bacteremia  Renal Function:   Estimated Creatinine Clearance: 16 mL/min (A) (by C-G formula based on SCr of 3.48 mg/dL (H)).    Antimicrobial dosage has been changed to:  Ampicillin 2g q12h   Additional comments: Worsening AKI; Scr 1.64>1.48>1.53>2.23>3.48 (baseline 1.17) Will keep the more frequent dosing w/in range of available adjustments for this degree of renal fxn based upon indication. Follow-up placing OPAT orders once SCr stable and discharge imminent  Thank you for allowing pharmacy to be a part of this patient's care.  Doreene Eland, PharmD, BCPS.   Work Cell: [phone] 01/09/2021 9:31 AM

## 2021-01-09 NOTE — Consult Note (Signed)
NAME:  Phillip Chavez, MRN:  [id number], DOB:  [birth year], LOS: 6 ADMISSION DATE:  01/03/2021, CONSULTATION DATE:  01/09/2021 REFERRING MD:  Dr. Billie Ruddy, CHIEF COMPLAINT:  Acute Respiratory Distress   Brief Pt Description / Synopsis:  80 year old male admitted with sepsis due to Enterococcus Faecalis BACTEREMIA in the setting of UTI and community-acquired pneumonia along with acute hypoxic respiratory failure in the setting of acute decompensated CHF.  History of Present Illness:  Phillip Chavez is a 80 year old male with a past medical history significant for COPD (not on home oxygen), recent diagnosis of CHF at the [place], hypertension, hyperlipidemia, GERD who presented to [facility] ED on 01/03/2021 due to complaints of shortness of breath.  Patient is currently somnolent on BiPAP with respiratory distress and unable to contribute to history, therefore history is obtained from ED and nursing notes.  The patient reported that he had been having upper respiratory symptoms for almost a month, he was initially seen at the [place] and diagnosed with upper viral respiratory infection.  This lasted for approximately 10 days and he seemed to have some improvement in symptoms but then symptoms returned in the past week.  Main symptoms include shortness of breath, productive cough with greenish colored sputum, fever, chills, and worsening bilateral lower extremity edema.  He also endorsed orthopnea, right-sided upper back pain.  He denied chest pain, abdominal pain, nausea, vomiting.  ED Course: Initial Vital Signs: temperature 100.4, blood pressure 124/44, heart rate 93, RR 18 Significant Labs: WBC 12.1, negative COVID PCR, troponin level 105, 147, BNP 473, AKI with creatinine 1.54, BUN 17 and GFR 45 (creatinine 1.17 and GFR> 60 on 02/01/2013) Imaging: Chest x-ray showed right middle lobe infiltration and streaks of bilateral basilar opacity  He was admitted by the Hospitalist for further workup and treatment of acute hypoxic  respiratory failure in the setting of CHF exacerbation and community-acquired pneumonia.  Hospital Course: Urine culture and blood cultures were positive for Enterococcus faecalis.  ID was consulted for assistance in treatment of Enterococcus bacteremia, they added ceftriaxone empirically for endocarditis until patient had TEE to rule it out completely.  Cardiology consulted for TEE.   Patient was also having neck and back pain, MRI of cervical, thoracic and lumbar spine ordered to rule out any discitis or osteomyelitis, unable to complete MRI of lumbar spine, pain is mostly in neck and upper back, negative for any osteomyelitis or discitis but did show some foraminal narrowing at cervical level which can be taken care as an outpatient.  TEE neg for vegetation.  On 01/09/2021 he developed acute respiratory distress with increasing FiO2 requirements.  He was placed on BiPAP and transferred to stepdown unit.  Chest x-ray is concerning for pulmonary edema with possible worsening of pneumonia.  PCCM is consulted for further assistance with acute hypoxic respiratory failure.  Pertinent  Medical History  Congestive heart failure COPD GERD Hypertension Hyperlipidemia  Micro Data:  01/03/2021: SARS-CoV-2 and influenza PCR>> negative 01/03/2021: Strep pneumo and Legionella urinary antigens>> negative 01/03/2021: Urine>> Enterococcus faecalis 01/03/2021: Blood culture>> Enterococcus faecalis 01/04/2021: Repeat urine>> no growth 01/06/2021: Repeat blood culture>> no growth to date  Antimicrobials:  Ceftriaxone 11/5>>11/9 Levofloxacin 11/5>>11/6 Unasyn 11/6>>11/7 Ampicillin 11/6>> Cefepime 11/11>> Vancomycin 11/5 x1 dose; restarted 11/11>>  Significant Hospital Events: Including procedures, antibiotic start and stop dates in addition to other pertinent events   01/03/21: Admitted by Hospitalist  01/09/21: Developed acute respiratory distress and hypoxia requiring Bipap and transfer to [employer]  High  risk for intubation.  PCCM consulted.  Nephrology consulted for worsening AKI.  Interim History / Subjective:  -This morning developed acute respiratory distress and worsening hypoxia -CXR concerning for pulmonary edema and possible worsening of PNA -Transferred to ICU on BiPAP ~ high risk for intubation ~ PCCM consulted -Pt's family reports that pt has previously said to them that he would not want to be maintained on life support, however no documentation on file ~ attempting to obtain Living Will from [facility] -At this time, wife and son want to continue current care ~ but considering transition to comfort measures -In meantime will treat with BiPAP, 80 mg IV Lasix, Solumedrol, and Bronchodilators as assess response  Objective   Blood pressure (!) 131/52, pulse 91, temperature (!) 96.2 F (35.7 C), temperature source Axillary, resp. rate 20, height 5\' 5"  (1.651 m), weight 85 kg, SpO2 96 %.    FiO2 (%):  [35 %-100 %] 100 %   Intake/Output Summary (Last 24 hours) at 01/09/2021 1054 Last data filed at 01/09/2021 0400 Gross per 24 hour  Intake 1749.21 ml  Output --  Net 1749.21 ml   Filed Weights   01/06/21 0333 01/08/21 0506 01/09/21 0954  Weight: 79.9 kg 81 kg 85 kg    Examination: General: Critically ill-appearing male, sitting in bed, on BiPAP with severe respiratory distress HENT: Atraumatic, normocephalic, neck supple, no JVD Lungs: Coarse rhonchi bilaterally with expiratory wheezing to lower fields bilaterally, increased work of breathing and accessory muscle use on BiPAP Cardiovascular: Tachycardia, regular rhythm, S1-S2, no murmurs, rubs, gallops Abdomen: Soft, nontender, nondistended, no guarding rebound tenderness, bowel sounds positive x4 Extremities: No deformities, 1+ edema to bilateral lower extremities Neuro: Somnolent, arouses to voice and moans but falls back asleep during conversation, pupils PERRLA GU: Foley catheter placed draining yellow urine  Resolved  Hospital Problem list     Assessment & Plan:   Acute Hypoxic Respiratory Failure in the setting of Pulmonary Edema, COPD Exacerbation, and Community Acquired Pneumonia -Supplemental O2 as needed to maintain O2 sats >88% -BiPAP, wean as tolerated -High risk for intubation ~ however family feels that pt would most likely not want intubation, especially if prolonged course expected -Received 80 mg IV Lasix -Further diuresis as BP and renal function permits -Bronchodilators -IV Solumedrol -Start empiric Vancomycin & Cefepime for now due to concern for possible worsening of PNA -Low dose PRN morphine for air hunger -Ensure adequate pulmonary hygiene  Acute Decompensated CHF with Pulmonary Edema Moderate to Severe Aortic Regurgitation Mildly elevated troponin, suspect demand ischemia PMHx of Hypertension, HLD -Continuous cardiac monitoring -Maintain MAP >65 -Vasopressors as needed to maintain MAP goal -Trend lactic acid until normalized -HS Troponin peaked at 147 -Transesophageal Echocardiogram 01/04/21: LVEF 37-04%, normal diastolic parameters, RV function normal, moderately elevated pulmonary hypertension, moderate to severe Tricuspid valve regurgitation, moderate to severe aortic valve regurgitation -Check BNP ~ 3236 -Received 80 mg IV Lasix ~ further diuresis as BP and renal function permits -Home Cozaar on hold due to AKI  Severe Sepsis and Enterococcal BACTEREMIA due to UTI and Community Acquired Pneumonia -Monitor fever curve -Trend WBC's & Procalcitonin -Follow cultures as above -ID is following, appreciate input ~ will defer ABX to ID -TEE on 11/8 negative for endocarditis -MRI of Cervical and Lumbar Spine without evidence of Osteomyelitis discitis -Continue empiric Ampicillin, will add empiric Cefepime and Vancomycin for now (given acute decline in respiratory status) pending cultures & sensitivities  Acute Kidney Injury -Monitor I&O's / urinary output -Follow  BMP -Ensure adequate renal perfusion -  Avoid nephrotoxic agents as able -Replace electrolytes as indicated -Nephrology consulted, appreciate input  Anemia of Chronic Disease -Monitor for S/Sx of bleeding -Trend CBC -Lovenox for VTE Prophylaxis  -Transfuse for Hgb <7    Best Practice (right click and "Reselect all SmartList Selections" daily)   Diet/type: NPO DVT prophylaxis: LMWH GI prophylaxis: PPI Lines: N/A Foley:  Yes, and it is still needed Code Status:  full code Last date of multidisciplinary goals of care discussion [01/09/2021]  Pt is critically ill with multiorgan failure.  High risk for intubation, cardiac arrest and death. Overall prognosis is very poor.  Recommend DNR/DNI status, with consideration for transition to comfort measures.  Pt's wife and son at bedside report that pt has mentioned in the past that he would not want to be resuscitated, however currently not on file.  Supposedly has living will with the [place] and they would like obtain documentation before making DNR/DNI status.  Have reached out to care management to obtain living will from [place].  Labs   CBC: Recent Labs  Lab 01/03/21 0605 01/05/21 0522 01/07/21 0443 01/08/21 0450 01/09/21 0543  WBC 12.1* 8.1 11.3* 7.8 10.1  NEUTROABS 9.3*  --   --   --   --   HGB 10.6* 9.7* 10.2* 9.1* 9.6*  HCT 32.2* 29.4* 31.3* 27.6* 29.5*  MCV 86.8 88.3 88.4 88.5 86.3  PLT 245 204 244 201 829    Basic Metabolic Panel: Recent Labs  Lab 01/04/21 0544 01/05/21 0522 01/07/21 0443 01/08/21 0450 01/09/21 0543  NA 133* 133* 135 132* 135  K 4.3 4.1 4.1 4.6 4.7  CL 104 103 103 103 103  CO2 21* 22 22 21* 22  GLUCOSE 111* 194* 106* 149* 140*  BUN 20 17 15  26* 46*  CREATININE 1.44* 1.48* 1.53* 2.23* 3.48*  CALCIUM 8.0* 8.1* 8.2* 7.8* 8.3*  MG 2.3  --   --  2.1 2.4  PHOS  --  3.7 4.1  --   --    GFR: Estimated Creatinine Clearance: 16.4 mL/min (A) (by C-G formula based on SCr of 3.48 mg/dL (H)). Recent Labs  Lab  01/03/21 0917 01/03/21 1046 01/05/21 0522 01/07/21 0443 01/08/21 0450 01/09/21 0543 01/09/21 0637  PROCALCITON  --  0.18  --   --   --   --  0.50  WBC  --   --  8.1 11.3* 7.8 10.1  --   LATICACIDVEN 1.0  --   --   --   --   --   --     Liver Function Tests: Recent Labs  Lab 01/03/21 1046 01/05/21 0522 01/07/21 0443  AST 66*  --   --   ALT 85*  --   --   ALKPHOS 87  --   --   BILITOT 1.3*  --   --   PROT 6.7  --   --   ALBUMIN 2.5* 2.3* 2.6*   No results for input(s): LIPASE, AMYLASE in the last 168 hours. No results for input(s): AMMONIA in the last 168 hours.  ABG No results found for: PHART, PCO2ART, PO2ART, HCO3, TCO2, ACIDBASEDEF, O2SAT   Coagulation Profile: No results for input(s): INR, PROTIME in the last 168 hours.  Cardiac Enzymes: No results for input(s): CKTOTAL, CKMB, CKMBINDEX, TROPONINI in the last 168 hours.  HbA1C: Hgb A1c MFr Bld  Date/Time Value Ref Range Status  01/05/2021 05:22 AM 6.1 (H) 4.8 - 5.6 % Final    Comment:    (NOTE) Pre  diabetes:          5.7%-6.4%  Diabetes:              >6.4%  Glycemic control for   <7.0% adults with diabetes     CBG: Recent Labs  Lab 01/09/21 0953  GLUCAP 267*    Review of Systems:   Unable to assess due to AMS/Somnolence/Respiratory Distress   Past Medical History:  He,  has a past medical history of Chronic CHF (congestive heart failure) ([place]), COPD (chronic obstructive pulmonary disease) ([place]), GERD (gastroesophageal reflux disease), HLD (hyperlipidemia), and HTN (hypertension).   Surgical History:   Past Surgical History:  Procedure Laterality Date   dental procedure     TEE WITHOUT CARDIOVERSION N/A 01/06/2021   Procedure: TRANSESOPHAGEAL ECHOCARDIOGRAM (TEE);  Surgeon: Minna Merritts, MD;  Location: [facility] ORS;  Service: Cardiovascular;  Laterality: N/A;     Social History:   reports that he quit smoking about 42 years ago. His smoking use included cigarettes. He has a 20.00  pack-year smoking history. He has never used smokeless tobacco. He reports that he does not currently use alcohol. He reports that he does not use drugs.   Family History:  His family history includes Pancreatic cancer in his brother; Prostate cancer in his brother and father.   Allergies Allergies  Allergen Reactions   Aggrenox [Aspirin-Dipyridamole Er] Nausea And Vomiting   Atorvastatin Other (See Comments)    Muscle pain   Azithromycin    Cefpodoxime Other (See Comments)    headache   Ciprofloxacin    Claritin [Loratadine] Other (See Comments)    Urinary retention   Contrast Media [Iodinated Diagnostic Agents] Hives   Doxazosin Other (See Comments)    Elevated blood pressure   Doxycycline    Equal [Aspartame] Diarrhea   Fenofibrate Other (See Comments)    Muscle pain   Flomax [Tamsulosin] Other (See Comments)    Elevated blood pressure   Flunisolide Other (See Comments)    Burning sensation   Fluticasone-Salmeterol     Throat irriation   Fluvastatin Other (See Comments)    Muscle pain   Gemfibrozil Other (See Comments)    Muscle pain   Haemophilus Influenzae Vaccines Other (See Comments)    Chest pain   Hydrochlorothiazide Other (See Comments)    weakness   Levofloxacin Other (See Comments)    Nervousness, aggitation   Lisinopril Other (See Comments)    Chest pain   Moxifloxacin Other (See Comments)    Altered behavior   Niacin And Related Other (See Comments)    flushing   Oxycodone    Plavix [Clopidogrel] Other (See Comments)    Constriction in throat   Pravastatin Other (See Comments)    Elevated blood pressure   Simvastatin Other (See Comments)    Muscle pain   Terazosin Other (See Comments)    Fatigue    Zetia [Ezetimibe] Other (See Comments)    Edema    Amoxicillin Palpitations    Chest pain   Mometasone Palpitations    Elevated blood pressure   Sulfa Antibiotics Rash and Hypertension     Home Medications  Prior to Admission medications    Medication Sig Start Date End Date Taking? Authorizing Provider  albuterol (VENTOLIN HFA) 108 (90 Base) MCG/ACT inhaler Inhale 2 puffs into the lungs every 6 (six) hours as needed for wheezing or shortness of breath.   Yes [provider]  cholecalciferol (VITAMIN D3) 25 MCG (1000 UNIT) tablet Take 1,000 Units by  mouth daily. 03/08/13  Yes [provider]  furosemide (LASIX) 20 MG tablet TAKE ONE TABLET BY MOUTH EVERY DAY FOR FLUID CONTROL, START 10/25 01/01/21  Yes [provider]  losartan (COZAAR) 50 MG tablet Take 25 mg by mouth daily. Pt taking half tabs at each dose for conservation   Yes [provider]  meloxicam (MOBIC) 15 MG tablet Take 15 mg by mouth daily.   Yes [provider]  Multiple Vitamins-Minerals (MULTIVITAMIN ADULTS PO) Take 1 tablet by mouth daily. 04/23/08  Yes [provider]  Omega-3 Fatty Acids (FISH OIL) 1000 MG CAPS TAKE 3 CAPSULES (SEA-OMEGA 30 FISH OIL) BY MOUTH TWO TIMES A DAY FOR CHOLESTEROL 01/08/20  Yes [provider]  omeprazole (PRILOSEC) 20 MG capsule Take 20 mg by mouth daily.   Yes [provider]  pantoprazole (PROTONIX) 40 MG tablet 1 tablet daily. 04/18/20  Yes [provider]  rosuvastatin (CRESTOR) 40 MG tablet Take 40 mg by mouth daily.   Yes [provider]  docusate sodium (COLACE) 100 MG capsule Take 100 mg by mouth 2 (two) times daily. Patient not taking: No sig reported    [provider]  Fluticasone-Salmeterol (ADVAIR) 250-50 MCG/DOSE AEPB Inhale 1 puff into the lungs 2 (two) times daily. Patient not taking: No sig reported    [provider]  ketotifen (ZADITOR) 0.025 % ophthalmic solution 1 drop 2 (two) times daily.    [provider]  pseudoephedrine-acetaminophen (TYLENOL SINUS) 30-500 MG TABS tablet Take 1 tablet by mouth every 4 (four) hours as needed.    [provider]     Critical care time: 50 minutes      Darel Hong, AGACNP-BC [facility] epic messenger for cross cover needs If after hours, please call E-link

## 2021-01-09 NOTE — Progress Notes (Addendum)
Pharmacy Antibiotic Note  Phillip Chavez is a 80 y.o. male admitted on 01/03/2021. Patient with E.faecalis bacteremia on ampicillin. He required transfer to the ICU 11/11 for acute decompensation. His renal function continues to decline. ID is following the patient. Pharmacy has been consulted for vancomycin and cefepime dosing.  Plan: Vancomycin 2000 mg IV x 1. With worsening renal function, will dose per levels for now. Check random vanc level tomorrow at 1300.  Cefepime 2 g IV q24h  ID is following the patient. Follow up recommendations.  Height: 5\' 5"  (165.1 cm) Weight: 85 kg (187 lb 6.3 oz) IBW/kg (Calculated) : 61.5  Temp (24hrs), Avg:97.5 F (36.4 C), Min:96.2 F (35.7 C), Max:98.1 F (36.7 C)  Recent Labs  Lab 01/03/21 0605 01/03/21 0917 01/03/21 1046 01/04/21 0544 01/05/21 0522 01/07/21 0443 01/08/21 0450 01/09/21 0543  WBC 12.1*  --   --   --  8.1 11.3* 7.8 10.1  CREATININE 1.54*  --    < > 1.44* 1.48* 1.53* 2.23* 3.48*  LATICACIDVEN  --  1.0  --   --   --   --   --   --    < > = values in this interval not displayed.    Estimated Creatinine Clearance: 16.4 mL/min (A) (by C-G formula based on SCr of 3.48 mg/dL (H)).    Allergies  Allergen Reactions   Aggrenox [Aspirin-Dipyridamole Er] Nausea And Vomiting   Atorvastatin Other (See Comments)    Muscle pain   Azithromycin    Cefpodoxime Other (See Comments)    headache   Ciprofloxacin    Claritin [Loratadine] Other (See Comments)    Urinary retention   Contrast Media [Iodinated Diagnostic Agents] Hives   Doxazosin Other (See Comments)    Elevated blood pressure   Doxycycline    Equal [Aspartame] Diarrhea   Fenofibrate Other (See Comments)    Muscle pain   Flomax [Tamsulosin] Other (See Comments)    Elevated blood pressure   Flunisolide Other (See Comments)    Burning sensation   Fluticasone-Salmeterol     Throat irriation   Fluvastatin Other (See Comments)    Muscle pain   Gemfibrozil Other (See  Comments)    Muscle pain   Haemophilus Influenzae Vaccines Other (See Comments)    Chest pain   Hydrochlorothiazide Other (See Comments)    weakness   Levofloxacin Other (See Comments)    Nervousness, aggitation   Lisinopril Other (See Comments)    Chest pain   Moxifloxacin Other (See Comments)    Altered behavior   Niacin And Related Other (See Comments)    flushing   Oxycodone    Plavix [Clopidogrel] Other (See Comments)    Constriction in throat   Pravastatin Other (See Comments)    Elevated blood pressure   Simvastatin Other (See Comments)    Muscle pain   Terazosin Other (See Comments)    Fatigue    Zetia [Ezetimibe] Other (See Comments)    Edema    Amoxicillin Palpitations    Chest pain   Mometasone Palpitations    Elevated blood pressure   Sulfa Antibiotics Rash and Hypertension    Antimicrobials this admission: Vancomycin 11/11 >> Cefepime 11/11 >> Ampicillin 11/7 >> Unasyn 11/6 >>11/7 Ceftriaxone 11/5 >>11/9   Microbiology results: 11/8 BCx: NG 11/5 BCx: 4/4 E.faecalis 11/6 UCx: NG  11 MRSA PCR: negative  Thank you for allowing pharmacy to be a part of this patient's care.  Tawnya Crook, PharmD, BCPS Clinical Pharmacist  01/09/2021 2:17 PM

## 2021-01-09 NOTE — Progress Notes (Signed)
Pharmacy Antibiotic Note  Phillip Chavez is a 80 y.o. male admitted on 01/03/2021. Patient with E.faecalis bacteremia on ampicillin. He required transfer to the ICU 11/11 for acute decompensation. His renal function continues to decline. ID is following the patient. Pharmacy has been consulted for vancomycin and cefepime dosing.  -11/11 MRSA PCR: neg -Renal function declining - concern patient may have aspirated   Plan: To cover aspiration and E faecalis bacteremia, change antibiotics to piperacillin/tazobactam 2.25gm IV q6h. As per discussion with ID and PCCM start 11/12 am Will monitor renal function and need to further adjust dose  Height: 5\' 5"  (165.1 cm) Weight: 85 kg (187 lb 6.3 oz) IBW/kg (Calculated) : 61.5  Temp (24hrs), Avg:97.5 F (36.4 C), Min:96.2 F (35.7 C), Max:98.1 F (36.7 C)  Recent Labs  Lab 01/03/21 0605 01/03/21 0917 01/03/21 1046 01/04/21 0544 01/05/21 0522 01/07/21 0443 01/08/21 0450 01/09/21 0543  WBC 12.1*  --   --   --  8.1 11.3* 7.8 10.1  CREATININE 1.54*  --    < > 1.44* 1.48* 1.53* 2.23* 3.48*  LATICACIDVEN  --  1.0  --   --   --   --   --   --    < > = values in this interval not displayed.     Estimated Creatinine Clearance: 16.4 mL/min (A) (by C-G formula based on SCr of 3.48 mg/dL (H)).    Allergies  Allergen Reactions   Aggrenox [Aspirin-Dipyridamole Er] Nausea And Vomiting   Atorvastatin Other (See Comments)    Muscle pain   Azithromycin    Cefpodoxime Other (See Comments)    headache   Ciprofloxacin    Claritin [Loratadine] Other (See Comments)    Urinary retention   Contrast Media [Iodinated Diagnostic Agents] Hives   Doxazosin Other (See Comments)    Elevated blood pressure   Doxycycline    Equal [Aspartame] Diarrhea   Fenofibrate Other (See Comments)    Muscle pain   Flomax [Tamsulosin] Other (See Comments)    Elevated blood pressure   Flunisolide Other (See Comments)    Burning sensation   Fluticasone-Salmeterol      Throat irriation   Fluvastatin Other (See Comments)    Muscle pain   Gemfibrozil Other (See Comments)    Muscle pain   Haemophilus Influenzae Vaccines Other (See Comments)    Chest pain   Hydrochlorothiazide Other (See Comments)    weakness   Levofloxacin Other (See Comments)    Nervousness, aggitation   Lisinopril Other (See Comments)    Chest pain   Moxifloxacin Other (See Comments)    Altered behavior   Niacin And Related Other (See Comments)    flushing   Oxycodone    Plavix [Clopidogrel] Other (See Comments)    Constriction in throat   Pravastatin Other (See Comments)    Elevated blood pressure   Simvastatin Other (See Comments)    Muscle pain   Terazosin Other (See Comments)    Fatigue    Zetia [Ezetimibe] Other (See Comments)    Edema    Amoxicillin Palpitations    Chest pain   Mometasone Palpitations    Elevated blood pressure   Sulfa Antibiotics Rash and Hypertension    Antimicrobials this admission: Piperacillin/tazobactam 11/12 >> Vancomycin 11/11 x 1  Cefepime 11/11 x1 Ampicillin 11/7 >>11/11 Unasyn 11/6 >>11/7 Ceftriaxone 11/5 >>11/9   Microbiology results: 11/8 BCx: NG 11/5 BCx: 4/4 E.faecalis 11/6 UCx: NG  11 MRSA PCR: negative  Thank you for allowing  pharmacy to be a part of this patient's care.  Doreene Eland, PharmD, BCPS.   Work Cell: [phone] 01/09/2021 2:57 PM

## 2021-01-09 NOTE — Progress Notes (Signed)
Called bedside by nursing with reports that the patient's wife, Jafari Mckillop, would like to change the patient's CODE STATUS. Mrs. Swingle discussed that her husband made it clear he would not want to be resuscitated or on mechanical ventilation.  The decision was made to change the patient to DNR/DNI code status.  Will continue rest of plan of care as ordered.   Domingo Pulse [place], AGACNP-BC Acute Care Nurse Practitioner [facility]   [phone] / [phone] Please see Amion for pager details.

## 2021-01-09 NOTE — Progress Notes (Signed)
PT Cancellation Note  Patient Details Name: Phillip Chavez MRN: [id number] DOB: 25-Aug-1940   Cancelled Treatment:    Reason Eval/Treat Not Completed: Other (comment). Pt with several staff in room, and currently on bipap. PT to re-attempt as able and medically appropriate for a re-evaluation due to changes in medical status.   Lieutenant Diego PT, DPT 10:10 AM,01/09/21

## 2021-01-09 NOTE — Progress Notes (Signed)
PROGRESS NOTE    Phillip Chavez  KGU:[id number] DOB: 20-Oct-1940 DOA: 01/03/2021 PCP: [facility] [place]  [id number]/[id number]-[id number]   Assessment & Plan:   Principal Problem:   Acute respiratory failure with hypoxia ([place]) Active Problems:   CAP (community acquired pneumonia)   Sepsis ([place])   COPD (chronic obstructive pulmonary disease) (HCC)   GERD (gastroesophageal reflux disease)   HTN (hypertension)   HLD (hyperlipidemia)   CHF exacerbation (HCC)   Elevated troponin   AKI (acute kidney injury) ([place])   Normocytic anemia   Aortic valve regurgitation   Phillip Chavez is a 80 y.o. male with medical history significant of hypertension, hyperlipidemia, COPD not on oxygen at home, GERD, recently diagnosed CHF in [place], who presents with shortness of breath.   Patient states that he has been having upper respiratory symptoms of for almost a month. He was initially seen at the [place] and diagnosed with viral upper respiratory tract infection.  This lasted for about 10 days and he seemed to get somewhat better but then symptoms returned in past week.  He has shortness of breath, productive cough with greenish colored sputum production. Patient has fever and chills.  Patient also has worsening bilateral lower extremity edema and orthopnea.  Per patient he was recently diagnosed with CHF but no echo done yet.  He was also hypoxic at 89% on arrival to ED which improved to high 90s on 2 L of oxygen.  He met sepsis criteria with temperature of 100.4, leukocytosis and chest x-ray concerning for right middle lobe infiltrate and streaky bilateral basilar opacities.  AKI with creatinine of 1.64, no recent baseline, prior creatinine in the system was 1.17 in December 2014. Preliminary blood cultures with Enterococcus faecalis. Initially started on Levaquin which were switched to Unasyn . Echo with normal EF, no regional wall motion abnormalities, normal diastolic function and mild aortic stenosis and no  vegetations noted.   ID was consulted for Enterococcus bacteremia, they added ceftriaxone empirically for endocarditis until patient had TEE to rule it out completely.  Cardiology consulted for TEE.   Patient was also having neck and back pain, MRI of cervical, thoracic and lumbar spine ordered to rule out any discitis or osteomyelitis, unable to complete MRI of lumbar spine, pain is mostly in neck and upper back, negative for any osteomyelitis or discitis but did show some foraminal narrowing at cervical level which can be taken care as an outpatient.  TEE neg for vegetation.   Acute hypoxemic respiratory failure 2/2 Pulm edema  Mod to severe aortic regurg --acute worsening dyspnea and hypoxia this morning, needed BiPAP with 100% O2.  CXR showed increased bilateral lung opacities are noted concerning for worsening pneumonia or edema. --gave IV lasix 80 mg x1 with poor response --transferred to stepdown, and consulted PCCM Plan: --start lasix gtt'@8'  --cont BiPAP as managed by PCCM --last known wish is ok for intubation.  Sepsis secondary to CAP and Enterococcus faecalis bacteremia.   Met sepsis criteria with fever, leukocytosis and concern of pneumonia, later blood cultures came back positive for Enterococcus faecalis.    Enterococcus bacteremia Patient received 1 dose of vancomycin and Rocephin in ED and then started on Levaquin. Levaquin was switched with Unasyn  based on preliminary blood cultures.  D added ceftriaxone empirically for endocarditis Unasyn was switched with ampicillin by ID. Preliminary repeat blood cultures negative so far.   --TEE neg for vegetation, ceftriaxone d/c'ed. Plan: --cont ampicillin for a total of 2 weeks, end date  01/17/21.  COPD exacerbation --severe dyspnea, increased cough, and apparent broncho-spasm.  started tx for COPD exacerbation on 11/9. --cont IV solumedrol 40 mg BID --BiPAP PRN  --Xopenx and Atrovent q6h  AKI, worsening --Cr went up to  2.23 from 1.53 presumed due to diuresis and dehydration, however, Cr went up even more after gentle MIVF --nephrology consult today  PNA --received 5 days of ceftriaxone  Elevated troponin.   Peaked at 147 and now trending down.  Likely secondary to demand ischemia with CHF exacerbation.  No chest pain.   Hypertension.   BP trending up --hold home Cozaar and lasix 2/2 AKI --cont Lopressor (new) if can take oral -IV hydralazine as needed   Normocytic anemia:  Hemoglobin 10.6>>9.7.  Anemia panel with anemia of chronic disease and some iron deficiency.  FOBT negative. --cont iron supplement (new) --monitor Hgb --rec outpatient colonoscopy   GERD (gastroesophageal reflux disease) -Protonix   HLD (hyperlipidemia) -Crestor   Generalized weakness. -PT/OT evaluation   DVT prophylaxis: Lovenox SQ Code Status: Full code confirmed on admission by admit physician with pt and wife.  Last known wish expressed to RN on morning of 11/11 was yes to breathing machine.  Family Communication: wife and son updated at bedside today  Level of care: Stepdown Dispo:   The patient is from: home Anticipated d/c is to: undetermined Anticipated d/c date is: undetermined Patient currently is not medically ready to d/c due to: BiPAP 100% O2, AKI, critically ill, multiorgan failure.   Subjective and Interval History:  Pt had sudden acute respiratory distress this morning, requiring BiPAP at 100% O2.  CXR showed likely flash pulm edema.  AKI also worsening.  Nephrology consulted and pt transferred to stepdown.   Objective: Vitals:   01/09/21 1500 01/09/21 1600 01/09/21 1700 01/09/21 1800  BP: (!) 104/58 (!) 102/55 137/61 (!) 105/49  Pulse: 94 80 98 88  Resp: (!) 22 17 (!) 24 18  Temp:  (!) 97 F (36.1 C)    TempSrc:  Axillary    SpO2: 97% 96% 100% 99%  Weight:      Height:        Intake/Output Summary (Last 24 hours) at 01/09/2021 1816 Last data filed at 01/09/2021 1721 Gross per 24  hour  Intake 1269.21 ml  Output 30 ml  Net 1239.21 ml   Filed Weights   01/06/21 0333 01/08/21 0506 01/09/21 0954  Weight: 79.9 kg 81 kg 85 kg    Examination:   Constitutional: somnolent CV: No cyanosis.   RESP: on BiPAP Extremities: No effusions, edema in BLE SKIN: warm, dry   Data Reviewed: I have personally reviewed following labs and imaging studies  CBC: Recent Labs  Lab 01/03/21 0605 01/05/21 0522 01/07/21 0443 01/08/21 0450 01/09/21 0543  WBC 12.1* 8.1 11.3* 7.8 10.1  NEUTROABS 9.3*  --   --   --   --   HGB 10.6* 9.7* 10.2* 9.1* 9.6*  HCT 32.2* 29.4* 31.3* 27.6* 29.5*  MCV 86.8 88.3 88.4 88.5 86.3  PLT 245 204 244 201 919   Basic Metabolic Panel: Recent Labs  Lab 01/04/21 0544 01/05/21 0522 01/07/21 0443 01/08/21 0450 01/09/21 0543  NA 133* 133* 135 132* 135  K 4.3 4.1 4.1 4.6 4.7  CL 104 103 103 103 103  CO2 21* 22 22 21* 22  GLUCOSE 111* 194* 106* 149* 140*  BUN '20 17 15 ' 26* 46*  CREATININE 1.44* 1.48* 1.53* 2.23* 3.48*  CALCIUM 8.0* 8.1* 8.2* 7.8* 8.3*  MG  2.3  --   --  2.1 2.4  PHOS  --  3.7 4.1  --   --    GFR: Estimated Creatinine Clearance: 16.4 mL/min (A) (by C-G formula based on SCr of 3.48 mg/dL (H)). Liver Function Tests: Recent Labs  Lab 01/03/21 1046 01/05/21 0522 01/07/21 0443  AST 66*  --   --   ALT 85*  --   --   ALKPHOS 87  --   --   BILITOT 1.3*  --   --   PROT 6.7  --   --   ALBUMIN 2.5* 2.3* 2.6*   No results for input(s): LIPASE, AMYLASE in the last 168 hours. No results for input(s): AMMONIA in the last 168 hours. Coagulation Profile: No results for input(s): INR, PROTIME in the last 168 hours. Cardiac Enzymes: No results for input(s): CKTOTAL, CKMB, CKMBINDEX, TROPONINI in the last 168 hours. BNP (last 3 results) No results for input(s): PROBNP in the last 8760 hours. HbA1C: No results for input(s): HGBA1C in the last 72 hours.  CBG: Recent Labs  Lab 01/09/21 0953  GLUCAP 267*   Lipid Profile: No  results for input(s): CHOL, HDL, LDLCALC, TRIG, CHOLHDL, LDLDIRECT in the last 72 hours. Thyroid Function Tests: Recent Labs    01/09/21 0637  TSH 1.375   Anemia Panel: No results for input(s): VITAMINB12, FOLATE, FERRITIN, TIBC, IRON, RETICCTPCT in the last 72 hours.  Sepsis Labs: Recent Labs  Lab 01/03/21 0917 01/03/21 1046 01/09/21 0637  PROCALCITON  --  0.18 0.50  LATICACIDVEN 1.0  --   --     Recent Results (from the past 240 hour(s))  Resp Panel by RT-PCR (Flu A&B, Covid) Nasopharyngeal Swab     Status: None   Collection Time: 01/03/21  6:06 AM   Specimen: Nasopharyngeal Swab; Nasopharyngeal(NP) swabs in vial transport medium  Result Value Ref Range Status   SARS Coronavirus 2 by RT PCR NEGATIVE NEGATIVE Final    Comment: (NOTE) SARS-CoV-2 target nucleic acids are NOT DETECTED.  The SARS-CoV-2 RNA is generally detectable in upper respiratory specimens during the acute phase of infection. The lowest concentration of SARS-CoV-2 viral copies this assay can detect is 138 copies/mL. A negative result does not preclude SARS-Cov-2 infection and should not be used as the sole basis for treatment or other patient management decisions. A negative result may occur with  improper specimen collection/handling, submission of specimen other than nasopharyngeal swab, presence of viral mutation(s) within the areas targeted by this assay, and inadequate number of viral copies(<138 copies/mL). A negative result must be combined with clinical observations, patient history, and epidemiological information. The expected result is Negative.  Fact Sheet for Patients:  EntrepreneurPulse.com.au  Fact Sheet for Healthcare Providers:  IncredibleEmployment.be  This test is no t yet approved or cleared by the [place] FDA and  has been authorized for detection and/or diagnosis of SARS-CoV-2 by FDA under an Emergency Use Authorization (EUA). This EUA  will remain  in effect (meaning this test can be used) for the duration of the COVID-19 declaration under Section 564(b)(1) of the Act, 21 U.S.C.section 360bbb-3(b)(1), unless the authorization is terminated  or revoked sooner.       Influenza A by PCR NEGATIVE NEGATIVE Final   Influenza B by PCR NEGATIVE NEGATIVE Final    Comment: (NOTE) The Xpert Xpress SARS-CoV-2/FLU/RSV plus assay is intended as an aid in the diagnosis of influenza from Nasopharyngeal swab specimens and should not be used as a sole basis for  treatment. Nasal washings and aspirates are unacceptable for Xpert Xpress SARS-CoV-2/FLU/RSV testing.  Fact Sheet for Patients: EntrepreneurPulse.com.au  Fact Sheet for Healthcare Providers: IncredibleEmployment.be  This test is not yet approved or cleared by the [place] FDA and has been authorized for detection and/or diagnosis of SARS-CoV-2 by FDA under an Emergency Use Authorization (EUA). This EUA will remain in effect (meaning this test can be used) for the duration of the COVID-19 declaration under Section 564(b)(1) of the Act, 21 U.S.C. section 360bbb-3(b)(1), unless the authorization is terminated or revoked.  Performed at [facility], [address] [place], [place] [address]   Blood culture (routine x 2)     Status: Abnormal   Collection Time: 01/03/21  9:17 AM   Specimen: BLOOD  Result Value Ref Range Status   Specimen Description   Final    BLOOD LEFT ANTECUBITAL Performed at [facility], [address] [place], [place] [address]    Special Requests   Final    BOTTLES DRAWN AEROBIC AND ANAEROBIC Blood Culture adequate volume Performed at [facility], [address] [place], [place] [address]    Culture  Setup Time   Final    GRAM POSITIVE COCCI IN BOTH AEROBIC AND ANAEROBIC BOTTLES CRITICAL RESULT CALLED TO, READ BACK BY AND VERIFIED WITH: NATHAN BELUE AT 9323  01/04/21.PMF Performed at [facility], [address] [place], [place] [address]    Culture (A)  Final    ENTEROCOCCUS FAECALIS SUSCEPTIBILITIES PERFORMED ON PREVIOUS CULTURE WITHIN THE LAST 5 DAYS. Performed at [facility], [place] [address] [place], [place] [address]    Report Status 01/06/2021 FINAL  Final  Blood culture (routine x 2)     Status: Abnormal   Collection Time: 01/03/21  9:17 AM   Specimen: BLOOD LEFT HAND  Result Value Ref Range Status   Specimen Description   Final    BLOOD LEFT HAND Performed at [facility], [address], [place], [place] [address]    Special Requests   Final    BOTTLES DRAWN AEROBIC AND ANAEROBIC Blood Culture adequate volume Performed at [facility], [address] [place], [place] [address]    Culture  Setup Time   Final    Organism ID to follow GRAM POSITIVE COCCI IN BOTH AEROBIC AND ANAEROBIC BOTTLES CRITICAL RESULT CALLED TO, READ BACK BY AND VERIFIED WITH: NATHAN BELUE AT 3151 01/04/21.PMF Performed at [facility], [address] [place],  [address]    Culture ENTEROCOCCUS FAECALIS (A)  Final   Report Status 01/06/2021 FINAL  Final   Organism ID, Bacteria ENTEROCOCCUS FAECALIS  Final      Susceptibility   Enterococcus faecalis - MIC*    AMPICILLIN <=2 SENSITIVE Sensitive     VANCOMYCIN 1 SENSITIVE Sensitive     GENTAMICIN SYNERGY SENSITIVE Sensitive     * ENTEROCOCCUS FAECALIS  Blood Culture ID Panel (Reflexed)     Status: Abnormal   Collection Time: 01/03/21  9:17 AM  Result Value Ref Range Status   Enterococcus faecalis DETECTED (A) NOT DETECTED Final    Comment: CRITICAL RESULT CALLED TO, READ BACK BY AND VERIFIED WITH: NATHAN BELUE AT 7371 01/04/21.PMF    Enterococcus Faecium NOT DETECTED NOT DETECTED Final   Listeria monocytogenes NOT DETECTED NOT DETECTED Final   Staphylococcus species NOT DETECTED NOT DETECTED Final   Staphylococcus aureus (BCID) NOT DETECTED NOT  DETECTED Final   Staphylococcus epidermidis NOT DETECTED NOT DETECTED Final  Staphylococcus lugdunensis NOT DETECTED NOT DETECTED Final   Streptococcus species NOT DETECTED NOT DETECTED Final   Streptococcus agalactiae NOT DETECTED NOT DETECTED Final   Streptococcus pneumoniae NOT DETECTED NOT DETECTED Final   Streptococcus pyogenes NOT DETECTED NOT DETECTED Final   A.calcoaceticus-baumannii NOT DETECTED NOT DETECTED Final   Bacteroides fragilis NOT DETECTED NOT DETECTED Final   Enterobacterales NOT DETECTED NOT DETECTED Final   Enterobacter cloacae complex NOT DETECTED NOT DETECTED Final   Escherichia coli NOT DETECTED NOT DETECTED Final   Klebsiella aerogenes NOT DETECTED NOT DETECTED Final   Klebsiella oxytoca NOT DETECTED NOT DETECTED Final   Klebsiella pneumoniae NOT DETECTED NOT DETECTED Final   Proteus species NOT DETECTED NOT DETECTED Final   Salmonella species NOT DETECTED NOT DETECTED Final   Serratia marcescens NOT DETECTED NOT DETECTED Final   Haemophilus influenzae NOT DETECTED NOT DETECTED Final   Neisseria meningitidis NOT DETECTED NOT DETECTED Final   Pseudomonas aeruginosa NOT DETECTED NOT DETECTED Final   Stenotrophomonas maltophilia NOT DETECTED NOT DETECTED Final   Candida albicans NOT DETECTED NOT DETECTED Final   Candida auris NOT DETECTED NOT DETECTED Final   Candida glabrata NOT DETECTED NOT DETECTED Final   Candida krusei NOT DETECTED NOT DETECTED Final   Candida parapsilosis NOT DETECTED NOT DETECTED Final   Candida tropicalis NOT DETECTED NOT DETECTED Final   Cryptococcus neoformans/gattii NOT DETECTED NOT DETECTED Final   Vancomycin resistance NOT DETECTED NOT DETECTED Final    Comment: Performed at [facility], [address] [place], [place] [address]  Urine Culture     Status: None   Collection Time: 01/04/21  5:21 PM   Specimen: Urine, Clean Catch  Result Value Ref Range Status   Specimen Description   Final    URINE, CLEAN  CATCH Performed at [facility], [address], [place], [place] [address]    Special Requests   Final    NONE Performed at [facility], [address], [place], [place] [address]    Culture   Final    NO GROWTH Performed at [facility], [place] [address], [place], [place] [address]    Report Status 01/06/2021 FINAL  Final  CULTURE, BLOOD (ROUTINE X 2) w Reflex to ID Panel     Status: None (Preliminary result)   Collection Time: 01/06/21  5:28 AM   Specimen: BLOOD  Result Value Ref Range Status   Specimen Description BLOOD LEFT FA  Final   Special Requests   Final    BOTTLES DRAWN AEROBIC AND ANAEROBIC Blood Culture adequate volume   Culture   Final    NO GROWTH 3 DAYS Performed at [facility], [address], [place],  [place] [address]    Report Status PENDING  Incomplete  CULTURE, BLOOD (ROUTINE X 2) w Reflex to ID Panel     Status: None (Preliminary result)   Collection Time: 01/06/21  5:29 AM   Specimen: BLOOD  Result Value Ref Range Status   Specimen Description BLOOD LEFT HAND  Final   Special Requests   Final    BOTTLES DRAWN AEROBIC AND ANAEROBIC Blood Culture adequate volume   Culture   Final    NO GROWTH 3 DAYS Performed at [facility], [address] [place], [place] [address]    Report Status PENDING  Incomplete  MRSA Next Gen by PCR, Nasal     Status: None   Collection Time: 01/09/21  9:50 AM   Specimen: Nasal Mucosa;  Nasal Swab  Result Value Ref Range Status   MRSA by PCR Next Gen NOT DETECTED NOT DETECTED Final    Comment: (NOTE) The GeneXpert MRSA Assay (FDA approved for NASAL specimens only), is one component of a comprehensive MRSA colonization surveillance program. It is not intended to diagnose MRSA infection nor to guide or monitor treatment for MRSA infections. Test performance is not FDA approved in patients less than 57 years old. Performed at [facility], [address],  [place], [place] [address]       Radiology Studies: [facility] Chest [place] 1 View  Result Date: 01/09/2021 CLINICAL DATA:  Acute respiratory failure with hypoxia. EXAM: PORTABLE CHEST 1 VIEW COMPARISON:  January 06, 2021. FINDINGS: The heart size and mediastinal contours are within normal limits. Significantly increased bilateral lung opacities are noted concerning for worsening pneumonia or edema. Small bilateral pleural effusions are noted. The visualized skeletal structures are unremarkable. IMPRESSION: Increased bilateral lung opacities are noted concerning for worsening pneumonia or edema. Electronically Signed   By: Marijo Conception M.D.   On: 01/09/2021 09:49     Scheduled Meds:  aspirin EC  81 mg Oral Daily   Chlorhexidine Gluconate Cloth  6 each Topical Daily   cholecalciferol  1,000 Units Oral Daily   enoxaparin (LOVENOX) injection  30 mg Subcutaneous QHS   feeding supplement  237 mL Oral BID BM   ferrous sulfate  325 mg Oral Q breakfast   ipratropium  0.5 mg Nebulization TID   levalbuterol  0.63 mg Nebulization TID   methylPREDNISolone (SOLU-MEDROL) injection  40 mg Intravenous Q12H   metoprolol tartrate  25 mg Oral BID   multivitamin with minerals  1 tablet Oral Daily   omega-3 acid ethyl esters  1 g Oral Daily   pantoprazole  40 mg Oral Daily   polyethylene glycol  17 g Oral BID   rosuvastatin  40 mg Oral QHS   Continuous Infusions:  sodium chloride Stopped (01/05/21 1432)   [START ON 01/10/2021] piperacillin-tazobactam (ZOSYN)  IV       LOS: 6 days   Critical care time: 50 minutes, severe hypoxia requiring BiPAP, worsening AKI, difficult to diurese, multi-organ failure.     Enzo Bi, MD [facility] If 7PM-7AM, please contact night-coverage 01/09/2021, 6:16 PM

## 2021-01-09 NOTE — Progress Notes (Signed)
Pt with increased work of breathing. Fio2 increased to 100% and o2 sats remain around 89-90%. RR in 40's and HR in 130's. Md notified and order placed for IV morphine and Lorazepam. Meds given with little effect. Pt with crackles throughout. CXR ordered and pending. MD notified of need to transfer to higher level of care. Pt asked if he would like to be placed on breathing machine if things continue with no progress. Pt stated, yes. Family at bedside and all questions and concerns addressed.

## 2021-01-09 NOTE — Treatment Plan (Signed)
Diagnosis: Enterococcal bacteremia Baseline Creatinine 3.49  Allergies  Allergen Reactions   Aggrenox [Aspirin-Dipyridamole Er] Nausea And Vomiting   Atorvastatin Other (See Comments)    Muscle pain   Azithromycin    Cefpodoxime Other (See Comments)    headache   Ciprofloxacin    Claritin [Loratadine] Other (See Comments)    Urinary retention   Contrast Media [Iodinated Diagnostic Agents] Hives   Doxazosin Other (See Comments)    Elevated blood pressure   Doxycycline    Equal [Aspartame] Diarrhea   Fenofibrate Other (See Comments)    Muscle pain   Flomax [Tamsulosin] Other (See Comments)    Elevated blood pressure   Flunisolide Other (See Comments)    Burning sensation   Fluticasone-Salmeterol     Throat irriation   Fluvastatin Other (See Comments)    Muscle pain   Gemfibrozil Other (See Comments)    Muscle pain   Haemophilus Influenzae Vaccines Other (See Comments)    Chest pain   Hydrochlorothiazide Other (See Comments)    weakness   Levofloxacin Other (See Comments)    Nervousness, aggitation   Lisinopril Other (See Comments)    Chest pain   Moxifloxacin Other (See Comments)    Altered behavior   Niacin And Related Other (See Comments)    flushing   Oxycodone    Plavix [Clopidogrel] Other (See Comments)    Constriction in throat   Pravastatin Other (See Comments)    Elevated blood pressure   Simvastatin Other (See Comments)    Muscle pain   Terazosin Other (See Comments)    Fatigue    Zetia [Ezetimibe] Other (See Comments)    Edema    Amoxicillin Palpitations    Chest pain   Mometasone Palpitations    Elevated blood pressure   Sulfa Antibiotics Rash and Hypertension    OPAT Orders Discharge antibiotics: Per pharmacy protocol ampicillin 2 grams Iv q 12 hrs ( frequency will change depending on cr level and crcl) Duration: 2 weeks total  End Date: 01/17/21   Midline Care Per Protocol:  Labs weekly while on IV antibiotics: _X_ CBC with  differential  __X CMP _  _X_ Please pull PIC at completion of IV antibiotics _ Fax weekly labs to [phone]  Clinic Follow Up Appt:2 weeks   Call [phone] to make appt

## 2021-01-09 NOTE — Consult Note (Signed)
[facility] CONSULT NOTE    Date: 01/09/2021                  Patient Name:  Phillip Chavez  MRN: [id number]  DOB: [birth year]  Age / Sex: 80 y.o., male         PCP: [place]                 Service Requesting Consult: Hospitalist                 Reason for Consult: Acute kidney injury, chronic kidney disease stage IIIa baseline creatinine 1.4, EGFR 49            History of Present Illness: Patient is a 80 y.o. male with a PMHx of hypertension, hyperlipidemia, COPD, GERD, history of congestive heart failure, who was admitted to [facility] on 01/03/2021 for evaluation of increasing shortness of breath.  Upon admission his temperature was 100.4 with leukocytosis and chest x-ray concerning for right middle lobe infiltrate.  Blood cultures were performed and these were positive for Enterococcus faecalis.  He was subsequently seen by infectious disease and started on appropriate antibiotic therapy.  He is currently on ampicillin 2 g IV every 8 hours.  We are asked to see him for evaluation management of acute kidney injury.  His baseline creatinine appears to be 1.44 with an EGFR of 49.  Renal function has deteriorated over the course of hospitalization his creatinine is now up to 3.48 with an EGFR of 17.  He did receive a dose of Lasix earlier in the admission.  Then it was felt that he was volume depleted and was started on maintenance IV fluids.  Now he has acute respiratory failure and is requiring 100% oxygen on BiPAP.  He is actively being moved to the critical care unit.     Medications: Outpatient medications: Medications Prior to Admission  Medication Sig Dispense Refill Last Dose   albuterol (VENTOLIN HFA) 108 (90 Base) MCG/ACT inhaler Inhale 2 puffs into the lungs every 6 (six) hours as needed for wheezing or shortness of breath.   01/02/2021 at 2000   cholecalciferol (VITAMIN D3) 25 MCG (1000 UNIT) tablet Take 1,000 Units by mouth daily.   01/02/2021  at 0800   furosemide (LASIX) 20 MG tablet TAKE ONE TABLET BY MOUTH EVERY DAY FOR FLUID CONTROL, START 10/25   01/02/2021   losartan (COZAAR) 50 MG tablet Take 25 mg by mouth daily. Pt taking half tabs at each dose for conservation   01/02/2021 at 2000   meloxicam (MOBIC) 15 MG tablet Take 15 mg by mouth daily.   01/02/2021 at 0800   Multiple Vitamins-Minerals (MULTIVITAMIN ADULTS PO) Take 1 tablet by mouth daily.   01/02/2021 at 0800   Omega-3 Fatty Acids (FISH OIL) 1000 MG CAPS TAKE 3 CAPSULES (SEA-OMEGA 30 FISH OIL) BY MOUTH TWO TIMES A DAY FOR CHOLESTEROL   01/02/2021 at 0800   omeprazole (PRILOSEC) 20 MG capsule Take 20 mg by mouth daily.   01/02/2021 at 0800   pantoprazole (PROTONIX) 40 MG tablet 1 tablet daily.   01/02/2021 at 0800   rosuvastatin (CRESTOR) 40 MG tablet Take 40 mg by mouth daily.   01/02/2021 at 2000   docusate sodium (COLACE) 100 MG capsule Take 100 mg by mouth 2 (two) times daily. (Patient not taking: No sig reported)   Not Taking   Fluticasone-Salmeterol (ADVAIR) 250-50 MCG/DOSE AEPB Inhale 1 puff into the lungs 2 (two)  times daily. (Patient not taking: No sig reported)   Not Taking   ketotifen (ZADITOR) 0.025 % ophthalmic solution 1 drop 2 (two) times daily.   prn at prn   pseudoephedrine-acetaminophen (TYLENOL SINUS) 30-500 MG TABS tablet Take 1 tablet by mouth every 4 (four) hours as needed.   prn at prn    Current medications: Current Facility-Administered Medications  Medication Dose Route Frequency Provider Last Rate Last Admin   0.9 %  sodium chloride infusion   Intravenous PRN Ivor Costa, MD   Stopped at 01/05/21 1432   acetaminophen (TYLENOL) tablet 650 mg  650 mg Oral Q6H PRN Ivor Costa, MD   650 mg at 01/05/21 0416   albuterol (PROVENTIL) (2.5 MG/3ML) 0.083% nebulizer solution 2.5 mg  2.5 mg Nebulization Q4H PRN Ivor Costa, MD   2.5 mg at 01/06/21 0453   ampicillin (OMNIPEN) 2 g in sodium chloride 0.9 % 100 mL IVPB  2 g Intravenous Q12H Berton Mount, RPH        aspirin EC tablet 81 mg  81 mg Oral Daily Ivor Costa, MD   81 mg at 01/08/21 6387   Chlorhexidine Gluconate Cloth 2 % PADS 6 each  6 each Topical Daily Tyler Pita, MD       cholecalciferol (VITAMIN D3) tablet 1,000 Units  1,000 Units Oral Daily Ivor Costa, MD   1,000 Units at 01/08/21 0951   dextromethorphan-guaiFENesin (Lima DM) 30-600 MG per 12 hr tablet 1 tablet  1 tablet Oral BID PRN Ivor Costa, MD   1 tablet at 01/05/21 0801   enoxaparin (LOVENOX) injection 30 mg  30 mg Subcutaneous QHS BeersShanon Brow, RPH   30 mg at 01/08/21 2124   feeding supplement (ENSURE ENLIVE / ENSURE PLUS) liquid 237 mL  237 mL Oral BID BM Enzo Bi, MD   237 mL at 01/08/21 1429   ferrous sulfate tablet 325 mg  325 mg Oral Q breakfast Lorella Nimrod, MD   325 mg at 01/08/21 0951   furosemide (LASIX) injection 80 mg  80 mg Intravenous Once Enzo Bi, MD       hydrALAZINE (APRESOLINE) injection 5 mg  5 mg Intravenous Q2H PRN Ivor Costa, MD       ipratropium (ATROVENT) nebulizer solution 0.5 mg  0.5 mg Nebulization TID Enzo Bi, MD   0.5 mg at 01/09/21 0736   labetalol (NORMODYNE) injection 10 mg  10 mg Intravenous Once Enzo Bi, MD       levalbuterol Penne Lash) nebulizer solution 0.63 mg  0.63 mg Nebulization TID Enzo Bi, MD   0.63 mg at 01/09/21 0736   LORazepam (ATIVAN) tablet 0.5 mg  0.5 mg Oral Q4H PRN Lorella Nimrod, MD   0.5 mg at 01/08/21 1756   methylPREDNISolone sodium succinate (SOLU-MEDROL) 40 mg/mL injection 40 mg  40 mg Intravenous Q12H Enzo Bi, MD       metoprolol tartrate (LOPRESSOR) tablet 25 mg  25 mg Oral BID Enzo Bi, MD       multivitamin with minerals tablet 1 tablet  1 tablet Oral Daily Ivor Costa, MD   1 tablet at 01/08/21 5643   omega-3 acid ethyl esters (LOVAZA) capsule 1 g  1 g Oral Daily Ivor Costa, MD   1 g at 01/08/21 0951   ondansetron (ZOFRAN) injection 4 mg  4 mg Intravenous Q8H PRN Ivor Costa, MD   4 mg at 01/05/21 0914   pantoprazole (PROTONIX) EC tablet 40 mg  40 mg  Oral Daily Niu,  Soledad Gerlach, MD   40 mg at 01/08/21 0951   polyethylene glycol (MIRALAX / GLYCOLAX) packet 17 g  17 g Oral BID Enzo Bi, MD   17 g at 01/08/21 2125   rosuvastatin (CRESTOR) tablet 40 mg  40 mg Oral Gordan Payment, MD   40 mg at 01/08/21 2235      Allergies: Allergies  Allergen Reactions   Aggrenox [Aspirin-Dipyridamole Er] Nausea And Vomiting   Atorvastatin Other (See Comments)    Muscle pain   Azithromycin    Cefpodoxime Other (See Comments)    headache   Ciprofloxacin    Claritin [Loratadine] Other (See Comments)    Urinary retention   Contrast Media [Iodinated Diagnostic Agents] Hives   Doxazosin Other (See Comments)    Elevated blood pressure   Doxycycline    Equal [Aspartame] Diarrhea   Fenofibrate Other (See Comments)    Muscle pain   Flomax [Tamsulosin] Other (See Comments)    Elevated blood pressure   Flunisolide Other (See Comments)    Burning sensation   Fluticasone-Salmeterol     Throat irriation   Fluvastatin Other (See Comments)    Muscle pain   Gemfibrozil Other (See Comments)    Muscle pain   Haemophilus Influenzae Vaccines Other (See Comments)    Chest pain   Hydrochlorothiazide Other (See Comments)    weakness   Levofloxacin Other (See Comments)    Nervousness, aggitation   Lisinopril Other (See Comments)    Chest pain   Moxifloxacin Other (See Comments)    Altered behavior   Niacin And Related Other (See Comments)    flushing   Oxycodone    Plavix [Clopidogrel] Other (See Comments)    Constriction in throat   Pravastatin Other (See Comments)    Elevated blood pressure   Simvastatin Other (See Comments)    Muscle pain   Terazosin Other (See Comments)    Fatigue    Zetia [Ezetimibe] Other (See Comments)    Edema    Amoxicillin Palpitations    Chest pain   Mometasone Palpitations    Elevated blood pressure   Sulfa Antibiotics Rash and Hypertension      Past Medical History: Past Medical History:  Diagnosis Date   Chronic  CHF (congestive heart failure) (HCC)    COPD (chronic obstructive pulmonary disease) (HCC)    GERD (gastroesophageal reflux disease)    HLD (hyperlipidemia)    HTN (hypertension)      Past Surgical History: Past Surgical History:  Procedure Laterality Date   dental procedure     TEE WITHOUT CARDIOVERSION N/A 01/06/2021   Procedure: TRANSESOPHAGEAL ECHOCARDIOGRAM (TEE);  Surgeon: Minna Merritts, MD;  Location: [facility] ORS;  Service: Cardiovascular;  Laterality: N/A;     Family History: Family History  Problem Relation Age of Onset   Prostate cancer Father    Prostate cancer Brother    Pancreatic cancer Brother      Social History: Social History   Socioeconomic History   Marital status: Married    Spouse name: Not on file   Number of children: Not on file   Years of education: Not on file   Highest education level: Not on file  Occupational History   Not on file  Tobacco Use   Smoking status: Former    Packs/day: 1.00    Years: 20.00    Pack years: 20.00    Types: Cigarettes    Quit date: 03/21/1978    Years since quitting: 42.8   Smokeless  tobacco: Never  Substance and Sexual Activity   Alcohol use: Not Currently   Drug use: Never   Sexual activity: Not on file  Other Topics Concern   Not on file  Social History Narrative   Not on file   Social Determinants of Health   Financial Resource Strain: Not on file  Food Insecurity: Not on file  Transportation Needs: Not on file  Physical Activity: Not on file  Stress: Not on file  Social Connections: Not on file  Intimate Partner Violence: Not on file     Review of Systems: Patient unable to provide review of systems as he is currently on BiPAP and not following commands  Vital Signs: Blood pressure (!) 176/54, pulse (!) 133, temperature (!) 96.2 F (35.7 C), temperature source Axillary, resp. rate (!) 34, height '5\' 5"'  (1.651 m), weight 85 kg, SpO2 96 %.  Weight trends: Filed Weights   01/06/21 0333  01/08/21 0506 01/09/21 0954  Weight: 79.9 kg 81 kg 85 kg     Physical Exam: General: Critically ill-appearing  Head: BiPAP facemask on  Eyes: Anicteric  Neck: Supple  Lungs:  Bilateral rales and rhonchi with increased work of breathing on BiPAP  Heart: S1S2 no rubs  Abdomen:  Soft, nontender, bowel sounds present  Extremities: 1+ peripheral edema.  Neurologic: Difficult to arouse  Skin: No acute rash  Access: No hemodialysis access    Lab results: Basic Metabolic Panel: Recent Labs  Lab 01/04/21 0544 01/05/21 0522 01/07/21 0443 01/08/21 0450 01/09/21 0543  NA 133* 133* 135 132* 135  K 4.3 4.1 4.1 4.6 4.7  CL 104 103 103 103 103  CO2 21* 22 22 21* 22  GLUCOSE 111* 194* 106* 149* 140*  BUN '20 17 15 ' 26* 46*  CREATININE 1.44* 1.48* 1.53* 2.23* 3.48*  CALCIUM 8.0* 8.1* 8.2* 7.8* 8.3*  MG 2.3  --   --  2.1 2.4  PHOS  --  3.7 4.1  --   --     Liver Function Tests: Recent Labs  Lab 01/03/21 1046 01/05/21 0522 01/07/21 0443  AST 66*  --   --   ALT 85*  --   --   ALKPHOS 87  --   --   BILITOT 1.3*  --   --   PROT 6.7  --   --   ALBUMIN 2.5* 2.3* 2.6*   No results for input(s): LIPASE, AMYLASE in the last 168 hours. No results for input(s): AMMONIA in the last 168 hours.  CBC: Recent Labs  Lab 01/03/21 0605 01/05/21 0522 01/07/21 0443 01/08/21 0450 01/09/21 0543  WBC 12.1* 8.1 11.3* 7.8 10.1  NEUTROABS 9.3*  --   --   --   --   HGB 10.6* 9.7* 10.2* 9.1* 9.6*  HCT 32.2* 29.4* 31.3* 27.6* 29.5*  MCV 86.8 88.3 88.4 88.5 86.3  PLT 245 204 244 201 244    Cardiac Enzymes: No results for input(s): CKTOTAL, CKMB, CKMBINDEX, TROPONINI in the last 168 hours.  BNP: Invalid input(s): POCBNP  CBG: Recent Labs  Lab 01/09/21 0953  GLUCAP 267*    Microbiology: Results for orders placed or performed during the hospital encounter of 01/03/21  Resp Panel by RT-PCR (Flu A&B, Covid) Nasopharyngeal Swab     Status: None   Collection Time: 01/03/21  6:06 AM    Specimen: Nasopharyngeal Swab; Nasopharyngeal(NP) swabs in vial transport medium  Result Value Ref Range Status   SARS Coronavirus 2 by RT PCR NEGATIVE NEGATIVE Final  Comment: (NOTE) SARS-CoV-2 target nucleic acids are NOT DETECTED.  The SARS-CoV-2 RNA is generally detectable in upper respiratory specimens during the acute phase of infection. The lowest concentration of SARS-CoV-2 viral copies this assay can detect is 138 copies/mL. A negative result does not preclude SARS-Cov-2 infection and should not be used as the sole basis for treatment or other patient management decisions. A negative result may occur with  improper specimen collection/handling, submission of specimen other than nasopharyngeal swab, presence of viral mutation(s) within the areas targeted by this assay, and inadequate number of viral copies(<138 copies/mL). A negative result must be combined with clinical observations, patient history, and epidemiological information. The expected result is Negative.  Fact Sheet for Patients:  EntrepreneurPulse.com.au  Fact Sheet for Healthcare Providers:  IncredibleEmployment.be  This test is no t yet approved or cleared by the [place] FDA and  has been authorized for detection and/or diagnosis of SARS-CoV-2 by FDA under an Emergency Use Authorization (EUA). This EUA will remain  in effect (meaning this test can be used) for the duration of the COVID-19 declaration under Section 564(b)(1) of the Act, 21 U.S.C.section 360bbb-3(b)(1), unless the authorization is terminated  or revoked sooner.       Influenza A by PCR NEGATIVE NEGATIVE Final   Influenza B by PCR NEGATIVE NEGATIVE Final    Comment: (NOTE) The Xpert Xpress SARS-CoV-2/FLU/RSV plus assay is intended as an aid in the diagnosis of influenza from Nasopharyngeal swab specimens and should not be used as a sole basis for treatment. Nasal washings and aspirates are  unacceptable for Xpert Xpress SARS-CoV-2/FLU/RSV testing.  Fact Sheet for Patients: EntrepreneurPulse.com.au  Fact Sheet for Healthcare Providers: IncredibleEmployment.be  This test is not yet approved or cleared by the [place] FDA and has been authorized for detection and/or diagnosis of SARS-CoV-2 by FDA under an Emergency Use Authorization (EUA). This EUA will remain in effect (meaning this test can be used) for the duration of the COVID-19 declaration under Section 564(b)(1) of the Act, 21 U.S.C. section 360bbb-3(b)(1), unless the authorization is terminated or revoked.  Performed at [facility], [address] [place], [place] [address]   Blood culture (routine x 2)     Status: Abnormal   Collection Time: 01/03/21  9:17 AM   Specimen: BLOOD  Result Value Ref Range Status   Specimen Description   Final    BLOOD LEFT ANTECUBITAL Performed at [facility], [address], [place], [place] [address]    Special Requests   Final    BOTTLES DRAWN AEROBIC AND ANAEROBIC Blood Culture adequate volume Performed at [facility], [address] [place], [place] [address]    Culture  Setup Time   Final    GRAM POSITIVE COCCI IN BOTH AEROBIC AND ANAEROBIC BOTTLES CRITICAL RESULT CALLED TO, READ BACK BY AND VERIFIED WITH: NATHAN BELUE AT 2774 01/04/21.PMF Performed at [facility], [address] [place], [place] [address]    Culture (A)  Final    ENTEROCOCCUS FAECALIS SUSCEPTIBILITIES PERFORMED ON PREVIOUS CULTURE WITHIN THE LAST 5 DAYS. Performed at [facility], [place] [address], [place], [place] [address]    Report Status 01/06/2021 FINAL  Final  Blood culture (routine x 2)     Status: Abnormal   Collection Time: 01/03/21  9:17 AM   Specimen: BLOOD LEFT HAND  Result Value Ref Range Status   Specimen Description   Final    BLOOD LEFT HAND Performed at [facility],  [place], [place] [address]    Special Requests   Final    BOTTLES DRAWN AEROBIC AND ANAEROBIC Blood Culture adequate volume Performed at [facility], [address] [place], [place] [address]    Culture  Setup Time   Final    Organism ID to follow GRAM POSITIVE COCCI IN BOTH AEROBIC AND ANAEROBIC BOTTLES CRITICAL RESULT CALLED TO, READ BACK BY AND VERIFIED WITH: NATHAN BELUE AT 9518 01/04/21.PMF Performed at [facility], [address] [place], [place] [address]    Culture ENTEROCOCCUS FAECALIS (A)  Final   Report Status 01/06/2021 FINAL  Final   Organism ID, Bacteria ENTEROCOCCUS FAECALIS  Final      Susceptibility   Enterococcus faecalis - MIC*    AMPICILLIN <=2 SENSITIVE Sensitive     VANCOMYCIN 1 SENSITIVE Sensitive     GENTAMICIN SYNERGY SENSITIVE Sensitive     * ENTEROCOCCUS FAECALIS  Blood Culture ID Panel (Reflexed)     Status: Abnormal   Collection Time: 01/03/21  9:17 AM  Result Value Ref Range Status   Enterococcus faecalis DETECTED (A) NOT DETECTED Final    Comment: CRITICAL RESULT CALLED TO, READ BACK BY AND VERIFIED WITH: NATHAN BELUE AT 0630 01/04/21.PMF    Enterococcus Faecium NOT DETECTED NOT DETECTED Final   Listeria monocytogenes NOT DETECTED NOT DETECTED Final   Staphylococcus species NOT DETECTED NOT DETECTED Final   Staphylococcus aureus (BCID) NOT DETECTED NOT DETECTED Final   Staphylococcus epidermidis NOT DETECTED NOT DETECTED Final   Staphylococcus lugdunensis NOT DETECTED NOT DETECTED Final   Streptococcus species NOT DETECTED NOT DETECTED Final   Streptococcus agalactiae NOT DETECTED NOT DETECTED Final   Streptococcus pneumoniae NOT DETECTED NOT DETECTED Final   Streptococcus pyogenes NOT DETECTED NOT DETECTED Final   A.calcoaceticus-baumannii NOT DETECTED NOT DETECTED Final   Bacteroides fragilis NOT DETECTED NOT DETECTED Final   Enterobacterales NOT DETECTED NOT DETECTED Final   Enterobacter cloacae complex NOT DETECTED  NOT DETECTED Final   Escherichia coli NOT DETECTED NOT DETECTED Final   Klebsiella aerogenes NOT DETECTED NOT DETECTED Final   Klebsiella oxytoca NOT DETECTED NOT DETECTED Final   Klebsiella pneumoniae NOT DETECTED NOT DETECTED Final   Proteus species NOT DETECTED NOT DETECTED Final   Salmonella species NOT DETECTED NOT DETECTED Final   Serratia marcescens NOT DETECTED NOT DETECTED Final   Haemophilus influenzae NOT DETECTED NOT DETECTED Final   Neisseria meningitidis NOT DETECTED NOT DETECTED Final   Pseudomonas aeruginosa NOT DETECTED NOT DETECTED Final   Stenotrophomonas maltophilia NOT DETECTED NOT DETECTED Final   Candida albicans NOT DETECTED NOT DETECTED Final   Candida auris NOT DETECTED NOT DETECTED Final   Candida glabrata NOT DETECTED NOT DETECTED Final   Candida krusei NOT DETECTED NOT DETECTED Final   Candida parapsilosis NOT DETECTED NOT DETECTED Final   Candida tropicalis NOT DETECTED NOT DETECTED Final   Cryptococcus neoformans/gattii NOT DETECTED NOT DETECTED Final   Vancomycin resistance NOT DETECTED NOT DETECTED Final    Comment: Performed at [facility], [address], [place], [place] [address]  Urine Culture     Status: None   Collection Time: 01/04/21  5:21 PM   Specimen: Urine, Clean Catch  Result Value Ref Range Status   Specimen Description   Final    URINE, CLEAN CATCH Performed at [facility], [address] [place], [place] [address]    Special Requests   Final    NONE Performed at [facility], [address], [place], [place] [address]    Culture   Final    NO GROWTH Performed at [facility], [place] [employer] [address], [place], [place] [address]    Report Status 01/06/2021 FINAL  Final  CULTURE, BLOOD (ROUTINE X 2) w Reflex to ID Panel     Status: None (Preliminary result)   Collection Time: 01/06/21  5:28 AM   Specimen: BLOOD  Result Value Ref Range Status   Specimen Description BLOOD LEFT FA  Final   Special  Requests   Final    BOTTLES DRAWN AEROBIC AND ANAEROBIC Blood Culture adequate volume   Culture   Final    NO GROWTH 3 DAYS Performed at [facility], [address], [place], [place] [address]    Report Status PENDING  Incomplete  CULTURE, BLOOD (ROUTINE X 2) w Reflex to ID Panel     Status: None (Preliminary result)   Collection Time: 01/06/21  5:29 AM   Specimen: BLOOD  Result Value Ref Range Status   Specimen Description BLOOD LEFT HAND  Final   Special Requests   Final    BOTTLES DRAWN AEROBIC AND ANAEROBIC Blood Culture adequate volume   Culture   Final    NO GROWTH 3 DAYS Performed at [facility], [address] [place], [place] [address]    Report Status PENDING  Incomplete    Coagulation Studies: No results for input(s): LABPROT, INR in the last 72 hours.  Urinalysis: No results for input(s): COLORURINE, LABSPEC, PHURINE, GLUCOSEU, HGBUR, BILIRUBINUR, KETONESUR, PROTEINUR, UROBILINOGEN, NITRITE, LEUKOCYTESUR in the last 72 hours.  Invalid input(s): APPERANCEUR    Imaging: DG Abd 1 View  Result Date: 01/07/2021 CLINICAL DATA:  Constipation EXAM: ABDOMEN - 1 VIEW COMPARISON:  None. FINDINGS: Air seen throughout much of the colon apart from rectum. There is no significant stool burden. Increased pelvic density may reflect bladder or stool within rectum. IMPRESSION: No significant stool burden. Increased pelvic density may reflect bladder or stool within rectum. Electronically Signed   By: Macy Mis M.D.   On: 01/07/2021 15:59   DG Chest Port 1 View  Result Date: 01/09/2021 CLINICAL DATA:  Acute respiratory failure with hypoxia. EXAM: PORTABLE CHEST 1 VIEW COMPARISON:  January 06, 2021. FINDINGS: The heart size and mediastinal contours are within normal limits. Significantly increased bilateral lung opacities are noted concerning for worsening pneumonia or edema. Small bilateral pleural effusions are noted. The visualized skeletal structures are  unremarkable. IMPRESSION: Increased bilateral lung opacities are noted concerning for worsening pneumonia or edema. Electronically Signed   By: Marijo Conception M.D.   On: 01/09/2021 09:49     Assessment & Plan: Pt is a 80 y.o. male with a PMHx of hypertension, hyperlipidemia, COPD, GERD, history of congestive heart failure, who was admitted to [facility] on 01/03/2021 for evaluation of increasing shortness of breath.   1.  Acute kidney injury/chronic kidney disease stage IIIa baseline EGFR 49.  Acute kidney injury now likely related to enterococcal sepsis along with respiratory failure.  Patient also received a dose of Lasix early on.  No contrast exposure noted.  eGFR now down to 17.  No immediate need for dialysis however suspect that this may be necessary if the patient's family desires aggressive care.  2.  Acute respiratory failure.  Patient had right middle lobe pneumonia noted initially upon presentation.  Infectious disease has been following the patient closely.  Currently on 100% oxygen via BiPAP.  High risk  for endotracheal intubation however patient's family has not made decision regarding CODE STATUS thus far.  3.  Anemia of chronic kidney disease.  Hemoglobin 9.6.  No immediate need for Epogen at the moment.  4.  Thanks for consultation.  Overall prognosis guarded.

## 2021-01-09 NOTE — Progress Notes (Addendum)
Date of Admission:  01/03/2021    ID: Phillip Chavez is a 80 y.o. male  Principal Problem:   Acute respiratory failure with hypoxia ([place]) Active Problems:   CAP (community acquired pneumonia)   Sepsis ([place])   COPD (chronic obstructive pulmonary disease) (H[facility])   GERD (gastroesophageal reflux disease)   HTN (hypertension)   HLD (hyperlipidemia)   CHF exacerbation ([facility])   Elevated troponin   AKI (acute kidney injury) ([place])   Normocytic anemia   Aortic valve regurgitation  Medical history from [place] record HTN w/ Heart Involvement CAD * (ICD-9-CM 414.9) Low Back Pain * (ICD-9-CM 724.2) Hyperlipidemia * (ICD-9-CM 272.4) Back Pain (ICD-9-CM 724.5) Spine Fracture (ICD-9-CM 805.8) Diplopia * (ICD-9-CM 368.2) Dermatophytosis of nail (ICD-9-CM 110.1) Unspecified Viral Infection (ICD-9-CM 079.99) Sinusitis nasal (ICD-9-CM 473.9) Dyspepsia * (ICD-9-CM 536.8) Rash and other nonspecific skin eruption (ICD-9-CM 782.1) Hematospermia Blood in urine Chronic obstructive lung disease (SNOMED CT 40981191) Obesity Prostatitis * (ICD-9-CM 601.9) Epidermal Cyst Emphysema, Pulmonary * (ICD-9-CM 492.8) Gross Hematuria (ICD-9-CM 599.71) Health Maintenance (ICD-9-CM V65.9) Hypertrophy (Benign) of Prostate without Urinary obstruction Vitamin D Deficiency (ICD-9-CM 268.9) Knee: arthralgia * (ICD-9-CM 719.46) Transient cerebral ischemia (SNOMED CT 478295621) Cerebrovascular accident (SNOMED CT 308657846) Insomnia Hypertensive disorder Dizziness Coronary arteriosclerosis Mixed hyperlipidemia Chronic obstructive lung disease Impaired fasting glycaemia Chronic prostatitis Gastroesophageal reflux disease without esophagitis Cataract Asbestos-induced pleural plaque Subjective: Pt moved to ICU with increaing SOB Resting with BIPAP No fever  Medications:   aspirin EC  81 mg Oral Daily   cholecalciferol  1,000 Units Oral Daily   enoxaparin (LOVENOX) injection  30 mg Subcutaneous QHS    feeding supplement  237 mL Oral BID BM   ferrous sulfate  325 mg Oral Q breakfast   ipratropium  0.5 mg Nebulization TID   levalbuterol  0.63 mg Nebulization TID   methylPREDNISolone (SOLU-MEDROL) injection  40 mg Intravenous Q12H   metoprolol tartrate  25 mg Oral BID   multivitamin with minerals  1 tablet Oral Daily   omega-3 acid ethyl esters  1 g Oral Daily   pantoprazole  40 mg Oral Daily   polyethylene glycol  17 g Oral BID   rosuvastatin  40 mg Oral QHS    Objective: Vital signs in last 24 hours: Temp:  [97.5 F (36.4 C)-98.1 F (36.7 C)] 98.1 F (36.7 C) (11/11 0458) Pulse Rate:  [87-107] 97 (11/11 0458) Resp:  [18-20] 18 (11/11 0458) BP: (116-168)/(49-65) 145/57 (11/11 0458) SpO2:  [98 %-100 %] 100 % (11/11 0458) FiO2 (%):  [35 %] 35 % (11/10 1950)  PHYSICAL EXAM:  General: on BIPAP somnolent Lungs: b/l air entry --rhonchi.crepts  Heart: Regular rate and rhythm, no murmur, rub or gallop. Abdomen: Soft, non-tender,not distended. Bowel sounds normal. No masses Extremities: atraumatic, no cyanosis. No edema. No clubbing Skin: No rashes or lesions. Or bruising Lymph: Cervical, supraclavicular normal. Neurologic: Grossly non-focal  Lab Results Recent Labs    01/08/21 0450 01/09/21 0543  WBC 7.8 10.1  HGB 9.1* 9.6*  HCT 27.6* 29.5*  NA 132* 135  K 4.6 4.7  CL 103 103  CO2 21* 22  BUN 26* 46*  CREATININE 2.23* 3.48*   Liver Panel Recent Labs    01/07/21 0443  ALBUMIN 2.6*    Microbiology: 01/03/21 BC- enterococcus fecalis 01/06/21 Repeat BC sent UC NG Studies/Results:    CRX= b/l worsening infltrates- pulmonary edema VS pneumonia   Assessment/Plan: ?pt presenting with sob and cough for the past month- intially started with fever  and was diagnosed as a viral infection a month ago at [place]  Now has SOB on exertion, orthopnea PND HE has underlying emphysema Asbestos pleural plaque  Worsening hypoxia with resp failure- transferred to ICU on  BIPAP CXR pulmonary edema VS aspiration pneumonia- BNP > 3000.received 80mg  IV lasix- antibiotic escalated to vanco and cefepime bu ICU team- because of worsening cr avoid vanco and also nares MRSA neg- so DC vanco and cefepime and change to zosyn DC ampicillin which was being given for enterococcus bacteremia( zosyn will cover)   Enterococcus bacteremia- with symptoms of new onset CHF -TEE done- no valvular vegetation. There is moderately elevated pulmonary artery systolic  pressure.of56.5 mmHg.   4. Left atrial size was moderately dilated. No left atrial/left atrial appendage thrombus was detected.  Moderate aortic regurgitation No hardware, no pacemaker or prosthetic valves   Source of enterococcus unclear ( it is a gi pathogen) UC Neg post void bladder scan was 365- repeat 127 Has had compression fracture thoracic spine and pain back and neck- MRI thoracic and cervical spine shows no discitis /infection though limited by absent contrast Will get appropriate antibiotic until 01/17/21  Pt has constipation alternating with  diarrhea ( more constipation) since Aug 2022 CT ABD/PELVIS WO CONTRAST done on 10/18/20 showed 1. Large amount of fecal content within the rectal vault may be consistent with constipation in the correct clinical setting. 2. Wall thickening of the urinary bladder. 3. Prominence of the prostate gland. 4. Cholelithiasis.  Stool wbc negative- so diarrhea is not related to any inflammatory cause    AKI- worsening-  PVR one time was 345 but repeat 125 H/o BPH Recommend CT abd /pelvis without contrast to look at bladder, kidney and prostate and at the colon   Anemia-   Discussed the management with wife, daughter at bed side and ICU team ID will follow him remotely this weekend- call if needed

## 2021-01-10 ENCOUNTER — Inpatient Hospital Stay: Payer: No Typology Code available for payment source

## 2021-01-10 DIAGNOSIS — J9601 Acute respiratory failure with hypoxia: Secondary | ICD-10-CM | POA: Diagnosis not present

## 2021-01-10 LAB — RENAL FUNCTION PANEL
Albumin: 2.7 g/dL — ABNORMAL LOW (ref 3.5–5.0)
Anion gap: 17 — ABNORMAL HIGH (ref 5–15)
BUN: 78 mg/dL — ABNORMAL HIGH (ref 8–23)
CO2: 19 mmol/L — ABNORMAL LOW (ref 22–32)
Calcium: 8.5 mg/dL — ABNORMAL LOW (ref 8.9–10.3)
Chloride: 101 mmol/L (ref 98–111)
Creatinine, Ser: 6.14 mg/dL — ABNORMAL HIGH (ref 0.61–1.24)
GFR, Estimated: 9 mL/min — ABNORMAL LOW (ref >60)
Glucose, Bld: 157 mg/dL — ABNORMAL HIGH (ref 70–99)
Phosphorus: 9.1 mg/dL — ABNORMAL HIGH (ref 2.5–4.6)
Potassium: 5.2 mmol/L — ABNORMAL HIGH (ref 3.5–5.1)
Sodium: 137 mmol/L (ref 135–145)

## 2021-01-10 LAB — CBC
HCT: 34.5 % — ABNORMAL LOW (ref 39.0–52.0)
HCT: 31.8 % — ABNORMAL LOW (ref 39.0–52.0)
Hemoglobin: 10.7 g/dL — ABNORMAL LOW (ref 13.0–17.0)
Hemoglobin: 10.6 g/dL — ABNORMAL LOW (ref 13.0–17.0)
MCH: 28.1 pg (ref 26.0–34.0)
MCH: 29.3 pg (ref 26.0–34.0)
MCHC: 31 g/dL (ref 30.0–36.0)
MCHC: 33.3 g/dL (ref 30.0–36.0)
MCV: 90.6 fL (ref 80.0–100.0)
MCV: 87.8 fL (ref 80.0–100.0)
Platelets: 278 10*3/uL (ref 150–400)
Platelets: 313 10*3/uL (ref 150–400)
RBC: 3.81 MIL/uL — ABNORMAL LOW (ref 4.22–5.81)
RBC: 3.62 MIL/uL — ABNORMAL LOW (ref 4.22–5.81)
RDW: 14.9 % (ref 11.5–15.5)
RDW: 14.9 % (ref 11.5–15.5)
WBC: 17.3 10*3/uL — ABNORMAL HIGH (ref 4.0–10.5)
WBC: 14.7 10*3/uL — ABNORMAL HIGH (ref 4.0–10.5)
nRBC: 0 % (ref 0.0–0.2)
nRBC: 0 % (ref 0.0–0.2)

## 2021-01-10 LAB — BASIC METABOLIC PANEL
Anion gap: 14 (ref 5–15)
BUN: 67 mg/dL — ABNORMAL HIGH (ref 8–23)
CO2: 19 mmol/L — ABNORMAL LOW (ref 22–32)
Calcium: 8.4 mg/dL — ABNORMAL LOW (ref 8.9–10.3)
Chloride: 103 mmol/L (ref 98–111)
Creatinine, Ser: 5.27 mg/dL — ABNORMAL HIGH (ref 0.61–1.24)
GFR, Estimated: 10 mL/min — ABNORMAL LOW (ref >60)
Glucose, Bld: 133 mg/dL — ABNORMAL HIGH (ref 70–99)
Potassium: 5.9 mmol/L — ABNORMAL HIGH (ref 3.5–5.1)
Sodium: 136 mmol/L (ref 135–145)

## 2021-01-10 LAB — MAGNESIUM: Magnesium: 2.7 mg/dL — ABNORMAL HIGH (ref 1.7–2.4)

## 2021-01-10 LAB — HEPATITIS B SURFACE ANTIBODY,QUALITATIVE: Hep B S Ab: NONREACTIVE

## 2021-01-10 LAB — CEA: CEA: 7 ng/mL — ABNORMAL HIGH (ref 0.0–4.7)

## 2021-01-10 LAB — GLUCOSE, CAPILLARY: Glucose-Capillary: 121 mg/dL — ABNORMAL HIGH (ref 70–99)

## 2021-01-10 LAB — HEPATITIS B SURFACE ANTIGEN: Hepatitis B Surface Ag: NONREACTIVE

## 2021-01-10 LAB — PROCALCITONIN: Procalcitonin: 1.61 ng/mL

## 2021-01-10 LAB — PHOSPHORUS: Phosphorus: 9.4 mg/dL — ABNORMAL HIGH (ref 2.5–4.6)

## 2021-01-10 IMAGING — DX DG CHEST 1V PORT
1 series · 2 of 2 positions shown · non-contrast
Comparison: [DATE]

CLINICAL DATA: Acute respiratory failure with hypoxia

EXAM:
PORTABLE CHEST 1 VIEW

[Series 1: chest ap · 0.14mm/px · 2 of 2 slices shown]
[im 1/2]
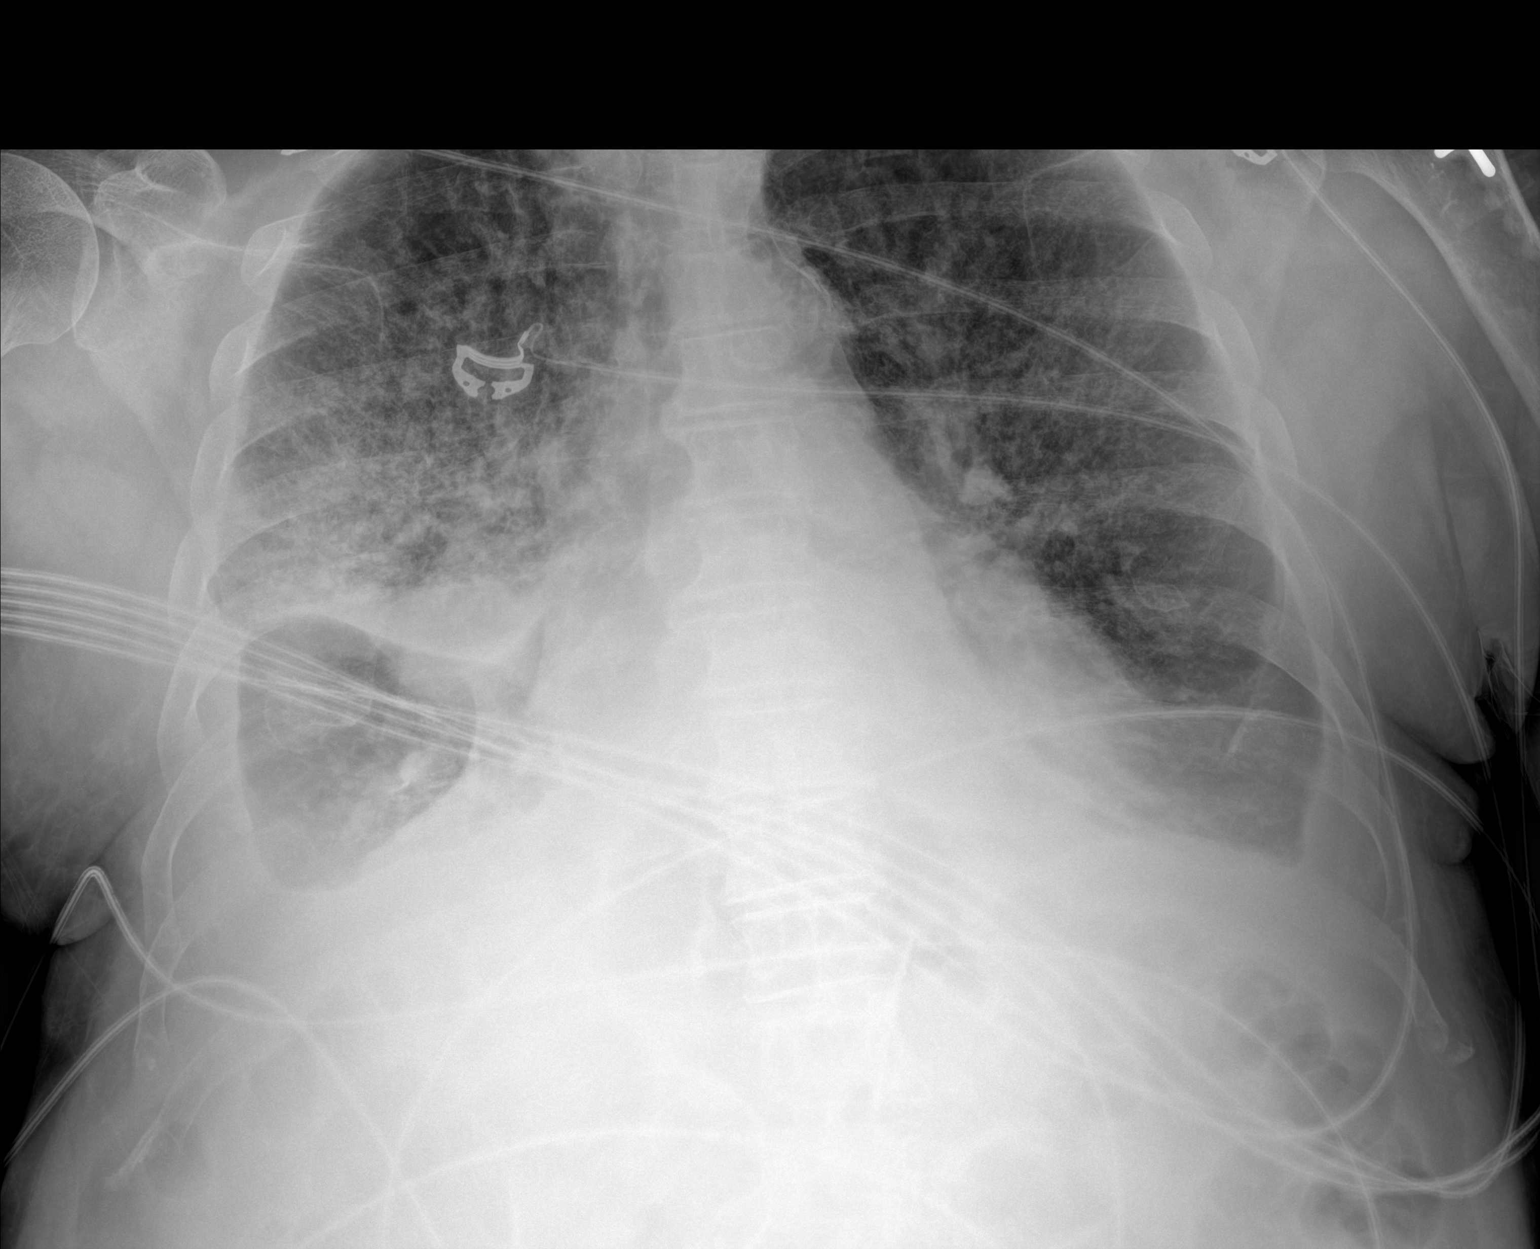
[im 2/2]
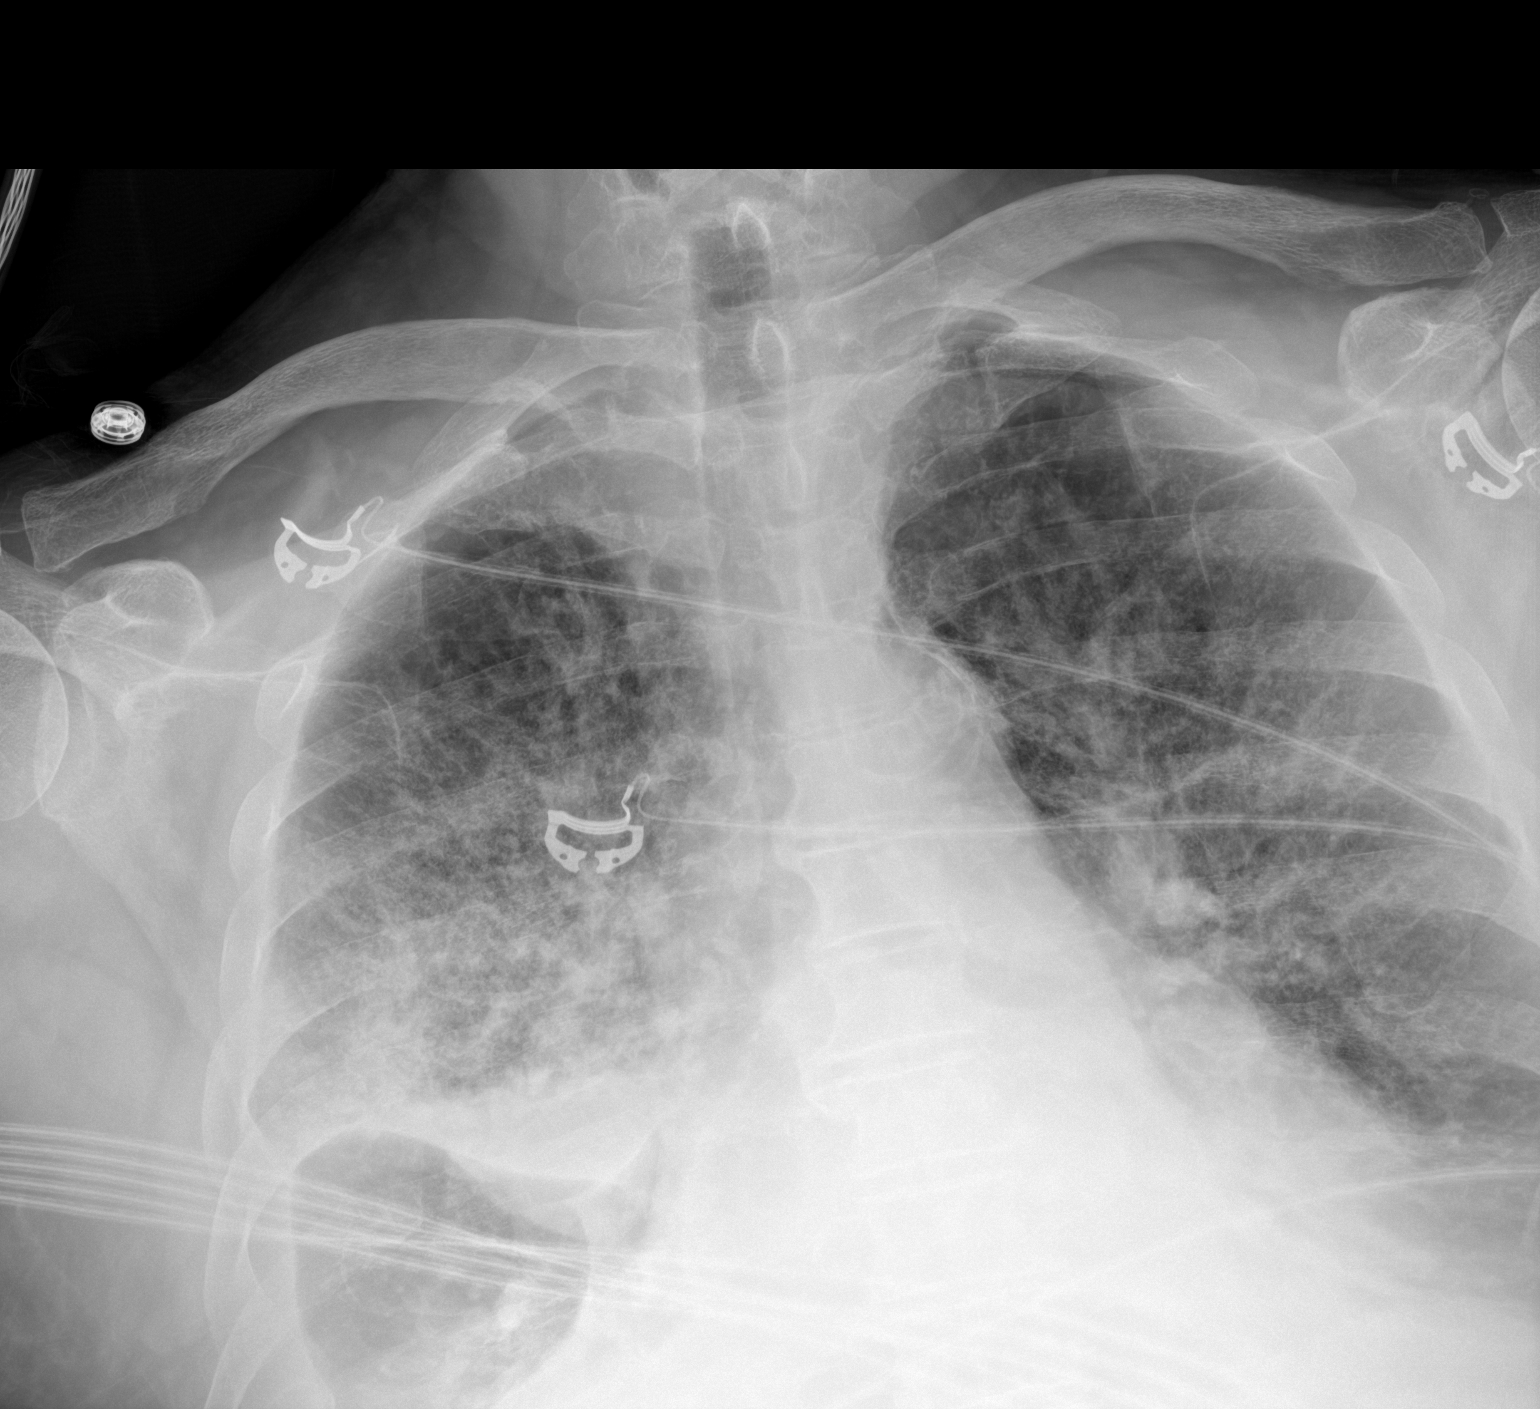

[2 of 2 positions shown; findings below may reference images not displayed]

FINDINGS: Diffuse opacities identified throughout bilateral lungs. There are
bilateral pleural effusions. Right pleural effusion with likely
enlarged compared to prior exam. The mediastinal contour and cardiac
silhouette are stable. Bony structures are stable.
IMPRESSION: 1. Diffuse opacities identified throughout bilateral lungs
concerning for pneumonia or edema unchanged compared prior exam.
2. Bilateral pleural effusions. Right pleural effusion is worsened
compared prior exam.

## 2021-01-10 MED ORDER — ALTEPLASE 2 MG IJ SOLR: 2.0000 mg | Freq: Once | INTRAMUSCULAR | Status: DC | PRN

## 2021-01-10 MED ORDER — PANTOPRAZOLE SODIUM 40 MG IV SOLR
40.0000 mg | INTRAVENOUS | Status: DC
Start: 2021-01-10 — End: 2021-01-12
  Administered 2021-01-10 – 2021-01-11 (×2): 40 mg via INTRAVENOUS
  Filled 2021-01-10 (×2): qty 40

## 2021-01-10 MED ORDER — IPRATROPIUM-ALBUTEROL 0.5-2.5 (3) MG/3ML IN SOLN: 3.0000 mL | Freq: Once | RESPIRATORY_TRACT | Status: DC

## 2021-01-10 MED ORDER — ROSUVASTATIN CALCIUM 10 MG PO TABS
10.0000 mg | ORAL_TABLET | Freq: Every day | ORAL | Status: DC
  Administered 2021-01-10 – 2021-01-23 (×13): 10 mg via ORAL
  Filled 2021-01-10 (×14): qty 1

## 2021-01-10 MED ORDER — PENTAFLUOROPROP-TETRAFLUOROETH EX AERO
1.0000 "application " | INHALATION_SPRAY | CUTANEOUS | Status: DC | PRN
  Filled 2021-01-10: qty 30

## 2021-01-10 MED ORDER — METOCLOPRAMIDE HCL 5 MG/ML IJ SOLN: 5.0000 mg | Freq: Three times a day (TID) | INTRAMUSCULAR | Status: DC

## 2021-01-10 MED ORDER — LORAZEPAM 2 MG/ML IJ SOLN
0.5000 mg | INTRAMUSCULAR | Status: DC | PRN
  Administered 2021-01-10 – 2021-01-13 (×3): 0.5 mg via INTRAVENOUS
  Filled 2021-01-10 (×3): qty 1

## 2021-01-10 MED ORDER — HEPARIN SODIUM (PORCINE) 1000 UNIT/ML DIALYSIS: 1000.0000 [IU] | INTRAMUSCULAR | Status: DC | PRN

## 2021-01-10 MED ORDER — HEPARIN SODIUM (PORCINE) 5000 UNIT/ML IJ SOLN
5000.0000 [IU] | Freq: Three times a day (TID) | INTRAMUSCULAR | Status: DC
  Administered 2021-01-10 – 2021-01-24 (×34): 5000 [IU] via SUBCUTANEOUS
  Filled 2021-01-10 (×38): qty 1

## 2021-01-10 MED ORDER — INSULIN ASPART 100 UNIT/ML IV SOLN
10.0000 [IU] | Freq: Once | INTRAVENOUS | Status: AC
  Administered 2021-01-10: 10 [IU] via INTRAVENOUS
  Filled 2021-01-10: qty 0.1

## 2021-01-10 MED ORDER — PIPERACILLIN-TAZOBACTAM IN DEX 2-0.25 GM/50ML IV SOLN
2.2500 g | Freq: Three times a day (TID) | INTRAVENOUS | Status: AC
  Administered 2021-01-11 – 2021-01-14 (×13): 2.25 g via INTRAVENOUS
  Filled 2021-01-10 (×17): qty 50

## 2021-01-10 MED ORDER — SODIUM CHLORIDE 0.9 % IV SOLN: 100.0000 mL | INTRAVENOUS | Status: DC | PRN

## 2021-01-10 MED ORDER — MORPHINE SULFATE (PF) 2 MG/ML IV SOLN
2.0000 mg | Freq: Once | INTRAVENOUS | Status: AC
  Administered 2021-01-10: 2 mg via INTRAVENOUS

## 2021-01-10 MED ORDER — LIDOCAINE HCL (PF) 1 % IJ SOLN
5.0000 mL | INTRAMUSCULAR | Status: DC | PRN
  Filled 2021-01-10: qty 5

## 2021-01-10 MED ORDER — MIDAZOLAM HCL 2 MG/2ML IJ SOLN
INTRAMUSCULAR | Status: AC
  Filled 2021-01-10: qty 2

## 2021-01-10 MED ORDER — DEXTROSE 50 % IV SOLN
1.0000 | Freq: Once | INTRAVENOUS | Status: AC
  Administered 2021-01-10: 50 mL via INTRAVENOUS
  Filled 2021-01-10: qty 50

## 2021-01-10 MED ORDER — LIDOCAINE-PRILOCAINE 2.5-2.5 % EX CREA
1.0000 "application " | TOPICAL_CREAM | CUTANEOUS | Status: DC | PRN
  Filled 2021-01-10: qty 5

## 2021-01-10 MED ORDER — LEVOTHYROXINE SODIUM 100 MCG/5ML IV SOLN: 25.0000 ug | Freq: Every day | INTRAVENOUS | Status: DC

## 2021-01-10 MED ORDER — HEPARIN SODIUM (PORCINE) 1000 UNIT/ML IJ SOLN
INTRAMUSCULAR | Status: AC
  Filled 2021-01-10: qty 1

## 2021-01-10 MED ORDER — LEVALBUTEROL HCL 0.63 MG/3ML IN NEBU
0.6300 mg | INHALATION_SOLUTION | Freq: Once | RESPIRATORY_TRACT | Status: AC
  Administered 2021-01-10: 0.63 mg via RESPIRATORY_TRACT

## 2021-01-10 MED ORDER — IPRATROPIUM BROMIDE 0.02 % IN SOLN
0.5000 mg | Freq: Once | RESPIRATORY_TRACT | Status: AC
  Administered 2021-01-10: 0.5 mg via RESPIRATORY_TRACT

## 2021-01-10 MED ORDER — LORAZEPAM 0.5 MG PO TABS
0.5000 mg | ORAL_TABLET | ORAL | Status: DC | PRN
  Administered 2021-01-12 – 2021-01-21 (×4): 0.5 mg via ORAL
  Filled 2021-01-10 (×5): qty 1

## 2021-01-10 MED ORDER — MIDAZOLAM HCL 2 MG/2ML IJ SOLN
0.5000 mg | Freq: Once | INTRAMUSCULAR | Status: AC
  Administered 2021-01-10: 0.5 mg via INTRAVENOUS

## 2021-01-10 MED ORDER — CHLORHEXIDINE GLUCONATE CLOTH 2 % EX PADS
6.0000 | MEDICATED_PAD | Freq: Every day | CUTANEOUS | Status: DC
  Administered 2021-01-11: 6 via TOPICAL

## 2021-01-10 NOTE — Progress Notes (Addendum)
PROGRESS NOTE    Phillip Chavez  WNI:[id number] DOB: 05-22-1940 DOA: 01/03/2021 PCP: [facility] [place]  [id number]/[id number]   Assessment & Plan:   Principal Problem:   Acute respiratory failure with hypoxia ([place]) Active Problems:   CAP (community acquired pneumonia)   Sepsis ([place])   COPD (chronic obstructive pulmonary disease) (HCC)   GERD (gastroesophageal reflux disease)   HTN (hypertension)   HLD (hyperlipidemia)   CHF exacerbation (HCC)   Elevated troponin   AKI (acute kidney injury) ([place])   Normocytic anemia   Aortic valve regurgitation   Phillip Chavez is a 80 y.o. male with medical history significant of hypertension, hyperlipidemia, COPD not on oxygen at home, GERD, recently diagnosed CHF in [place], who presents with shortness of breath.   Patient states that he has been having upper respiratory symptoms of for almost a month. He was initially seen at the [place] and diagnosed with viral upper respiratory tract infection.  This lasted for about 10 days and he seemed to get somewhat better but then symptoms returned in past week.  He has shortness of breath, productive cough with greenish colored sputum production. Patient has fever and chills.  Patient also has worsening bilateral lower extremity edema and orthopnea.  Per patient he was recently diagnosed with CHF but no echo done yet.  He was also hypoxic at 89% on arrival to ED which improved to high 90s on 2 L of oxygen.  He met sepsis criteria with temperature of 100.4, leukocytosis and chest x-ray concerning for right middle lobe infiltrate and streaky bilateral basilar opacities.  AKI with creatinine of 1.64, no recent baseline, prior creatinine in the system was 1.17 in December 2014. Preliminary blood cultures with Enterococcus faecalis. Initially started on Levaquin which were switched to Unasyn . Echo with normal EF, no regional wall motion abnormalities, normal diastolic function and mild aortic stenosis and no  vegetations noted.   ID was consulted for Enterococcus bacteremia, they added ceftriaxone empirically for endocarditis until patient had TEE to rule it out completely.  Cardiology consulted for TEE.   Patient was also having neck and back pain, MRI of cervical, thoracic and lumbar spine ordered to rule out any discitis or osteomyelitis, unable to complete MRI of lumbar spine, pain is mostly in neck and upper back, negative for any osteomyelitis or discitis but did show some foraminal narrowing at cervical level which can be taken care as an outpatient.  TEE neg for vegetation.   Acute hypoxemic respiratory failure 2/2 Pulm edema  Mod to severe aortic regurg --acute worsening dyspnea and hypoxia morning of 11/11, needed BiPAP with 100% O2.  CXR showed increased bilateral lung opacities are noted concerning for worsening pneumonia or edema, though given sudden development, favor pulm edema. --gave IV lasix 80 mg x1 with poor response --transferred to stepdown, and consulted PCCM Plan: --cont BiPAP as managed by PCCM --due to poor response to diuresis, nephrology plan to start temporary dialysis  Sepsis secondary to CAP and Enterococcus faecalis bacteremia, POA   Met sepsis criteria with fever, leukocytosis and concern of pneumonia, later blood cultures came back positive for Enterococcus faecalis.    Enterococcus bacteremia Patient received 1 dose of vancomycin and Rocephin in ED and then started on Levaquin. Levaquin was switched with Unasyn  based on preliminary blood cultures.  D added ceftriaxone empirically for endocarditis Unasyn was switched with ampicillin by ID. Repeat blood cultures negative. --TEE neg for vegetation, ceftriaxone d/c'ed. --abx switched to zosyn on  11/11 to cover both aspiration and enterococcus. Plan: --cont zosyn  COPD exacerbation --severe dyspnea, increased cough, and apparent broncho-spasm.  started tx for COPD exacerbation on 11/9. --cont IV solumedrol 40  mg BID --cont Xopenex and Atrovent scheduled  AKI, worsening, becoming oligouric --Cr went up to 2.23 from 1.53 presumed due to diuresis and dehydration, however, Cr went up even more after gentle MIVF.  Suspect from volume overload.  Pt did not respond to dialysis. --due to poor response to diuresis, nephrology plan to start temporary dialysis  PNA --received 5 days of ceftriaxone  Elevated troponin.   Peaked at 147 and then trending down.  Likely secondary to demand ischemia with CHF exacerbation.  No chest pain.   Hypertension.   BP trending up --hold home Cozaar and lasix 2/2 AKI --cont Lopressor (new) if can take oral -IV hydralazine as needed   Normocytic anemia:  Hemoglobin 10.6>>9.7.  Anemia panel with anemia of chronic disease and some iron deficiency.  FOBT negative. --cont iron supplement (new) --monitor Hgb --rec outpatient colonoscopy   GERD (gastroesophageal reflux disease) -Protonix   HLD (hyperlipidemia) -Crestor   Generalized weakness. -PT/OT evaluation   DVT prophylaxis: Lovenox SQ Code Status: DNR Family Communication: wife and daughter updated at bedside today  Level of care: Stepdown Dispo:   The patient is from: home Anticipated d/c is to: undetermined Anticipated d/c date is: undetermined Patient currently is not medically ready to d/c due to: BiPAP, AKI becoming oliguric, critically ill, multiorgan failure.   Subjective and Interval History:  Wife decided to make pt DNR last night.  Today, Wife reported pt was more comfortable.  Woke up intermittently.    Poor urine output.  Plan to start temporary dialysis.   Objective: Vitals:   01/10/21 1100 01/10/21 1323 01/10/21 1500 01/10/21 1511  BP: (!) 133/45  (!) 110/35   Pulse: 83 93 84 94  Resp: '14 20 19 ' (!) 24  Temp:      TempSrc:      SpO2: 93% 97% 97% 98%  Weight:      Height:        Intake/Output Summary (Last 24 hours) at 01/10/2021 1713 Last data filed at 01/10/2021  0548 Gross per 24 hour  Intake 51.56 ml  Output 90 ml  Net -38.44 ml   Filed Weights   01/08/21 0506 01/09/21 0954 01/10/21 0500  Weight: 81 kg 85 kg 85.1 kg    Examination:   Constitutional: NAD, somnolent CV: No cyanosis.   RESP: on BiPAP Extremities: No effusions, edema in BLE SKIN: warm, dry   Data Reviewed: I have personally reviewed following labs and imaging studies  CBC: Recent Labs  Lab 01/07/21 0443 01/08/21 0450 01/09/21 0543 01/10/21 0500 01/10/21 1400  WBC 11.3* 7.8 10.1 17.3* 14.7*  HGB 10.2* 9.1* 9.6* 10.7* 10.6*  HCT 31.3* 27.6* 29.5* 34.5* 31.8*  MCV 88.4 88.5 86.3 90.6 87.8  PLT 244 201 244 278 124   Basic Metabolic Panel: Recent Labs  Lab 01/04/21 0544 01/05/21 0522 01/07/21 0443 01/08/21 0450 01/09/21 0543 01/10/21 0500 01/10/21 1444  NA 133* 133* 135 132* 135 136 137  K 4.3 4.1 4.1 4.6 4.7 5.9* 5.2*  CL 104 103 103 103 103 103 101  CO2 21* 22 22 21* 22 19* 19*  GLUCOSE 111* 194* 106* 149* 140* 133* 157*  BUN '20 17 15 ' 26* 46* 67* 78*  CREATININE 1.44* 1.48* 1.53* 2.23* 3.48* 5.27* 6.14*  CALCIUM 8.0* 8.1* 8.2* 7.8* 8.3* 8.4* 8.5*  MG 2.3  --   --  2.1 2.4 2.7*  --   PHOS  --  3.7 4.1  --   --  9.4* 9.1*   GFR: Estimated Creatinine Clearance: 9.3 mL/min (A) (by C-G formula based on SCr of 6.14 mg/dL (H)). Liver Function Tests: Recent Labs  Lab 01/05/21 0522 01/07/21 0443 01/10/21 1444  ALBUMIN 2.3* 2.6* 2.7*   No results for input(s): LIPASE, AMYLASE in the last 168 hours. No results for input(s): AMMONIA in the last 168 hours. Coagulation Profile: No results for input(s): INR, PROTIME in the last 168 hours. Cardiac Enzymes: No results for input(s): CKTOTAL, CKMB, CKMBINDEX, TROPONINI in the last 168 hours. BNP (last 3 results) No results for input(s): PROBNP in the last 8760 hours. HbA1C: No results for input(s): HGBA1C in the last 72 hours.  CBG: Recent Labs  Lab 01/09/21 0953  GLUCAP 267*   Lipid Profile: No  results for input(s): CHOL, HDL, LDLCALC, TRIG, CHOLHDL, LDLDIRECT in the last 72 hours. Thyroid Function Tests: Recent Labs    01/09/21 0637  TSH 1.375   Anemia Panel: No results for input(s): VITAMINB12, FOLATE, FERRITIN, TIBC, IRON, RETICCTPCT in the last 72 hours.  Sepsis Labs: Recent Labs  Lab 01/09/21 0637 01/10/21 0500  PROCALCITON 0.50 1.61    Recent Results (from the past 240 hour(s))  Resp Panel by RT-PCR (Flu A&B, Covid) Nasopharyngeal Swab     Status: None   Collection Time: 01/03/21  6:06 AM   Specimen: Nasopharyngeal Swab; Nasopharyngeal(NP) swabs in vial transport medium  Result Value Ref Range Status   SARS Coronavirus 2 by RT PCR NEGATIVE NEGATIVE Final    Comment: (NOTE) SARS-CoV-2 target nucleic acids are NOT DETECTED.  The SARS-CoV-2 RNA is generally detectable in upper respiratory specimens during the acute phase of infection. The lowest concentration of SARS-CoV-2 viral copies this assay can detect is 138 copies/mL. A negative result does not preclude SARS-Cov-2 infection and should not be used as the sole basis for treatment or other patient management decisions. A negative result may occur with  improper specimen collection/handling, submission of specimen other than nasopharyngeal swab, presence of viral mutation(s) within the areas targeted by this assay, and inadequate number of viral copies(<138 copies/mL). A negative result must be combined with clinical observations, patient history, and epidemiological information. The expected result is Negative.  Fact Sheet for Patients:  EntrepreneurPulse.com.au  Fact Sheet for Healthcare Providers:  IncredibleEmployment.be  This test is no t yet approved or cleared by the [place] FDA and  has been authorized for detection and/or diagnosis of SARS-CoV-2 by FDA under an Emergency Use Authorization (EUA). This EUA will remain  in effect (meaning this test can be  used) for the duration of the COVID-19 declaration under Section 564(b)(1) of the Act, 21 U.S.C.section 360bbb-3(b)(1), unless the authorization is terminated  or revoked sooner.       Influenza A by PCR NEGATIVE NEGATIVE Final   Influenza B by PCR NEGATIVE NEGATIVE Final    Comment: (NOTE) The Xpert Xpress SARS-CoV-2/FLU/RSV plus assay is intended as an aid in the diagnosis of influenza from Nasopharyngeal swab specimens and should not be used as a sole basis for treatment. Nasal washings and aspirates are unacceptable for Xpert Xpress SARS-CoV-2/FLU/RSV testing.  Fact Sheet for Patients: EntrepreneurPulse.com.au  Fact Sheet for Healthcare Providers: IncredibleEmployment.be  This test is not yet approved or cleared by the [place] FDA and has been authorized for detection and/or diagnosis of SARS-CoV-2 by FDA under  an Emergency Use Authorization (EUA). This EUA will remain in effect (meaning this test can be used) for the duration of the COVID-19 declaration under Section 564(b)(1) of the Act, 21 U.S.C. section 360bbb-3(b)(1), unless the authorization is terminated or revoked.  Performed at [facility], [address] [place], [place] [address]   Blood culture (routine x 2)     Status: Abnormal   Collection Time: 01/03/21  9:17 AM   Specimen: BLOOD  Result Value Ref Range Status   Specimen Description   Final    BLOOD LEFT ANTECUBITAL Performed at [facility], [address], [place], [place] [address]    Special Requests   Final    BOTTLES DRAWN AEROBIC AND ANAEROBIC Blood Culture adequate volume Performed at [facility], [address] [place], [place] [address]    Culture  Setup Time   Final    GRAM POSITIVE COCCI IN BOTH AEROBIC AND ANAEROBIC BOTTLES CRITICAL RESULT CALLED TO, READ BACK BY AND VERIFIED WITH: NATHAN BELUE AT 5188 01/04/21.PMF Performed at [facility], [address] [place], [place] [address]    Culture (A)  Final    ENTEROCOCCUS FAECALIS SUSCEPTIBILITIES PERFORMED ON PREVIOUS CULTURE WITHIN THE LAST 5 DAYS. Performed at [facility], [place] [address] [place], [place] [address]    Report Status 01/06/2021 FINAL  Final  Blood culture (routine x 2)     Status: Abnormal   Collection Time: 01/03/21  9:17 AM   Specimen: BLOOD LEFT HAND  Result Value Ref Range Status   Specimen Description   Final    BLOOD LEFT HAND Performed at [facility], [address] [place], [place] [address]    Special Requests   Final    BOTTLES DRAWN AEROBIC AND ANAEROBIC Blood Culture adequate volume Performed at [facility], [address] [place], [place] [address]    Culture  Setup Time   Final    Organism ID to follow GRAM POSITIVE COCCI IN BOTH AEROBIC AND ANAEROBIC BOTTLES CRITICAL RESULT CALLED TO, READ BACK BY AND VERIFIED WITH: NATHAN BELUE AT 3220 01/04/21.PMF Performed at [facility], [address] [place],  [address]    Culture ENTEROCOCCUS FAECALIS (A)  Final   Report Status 01/06/2021 FINAL  Final   Organism ID, Bacteria ENTEROCOCCUS FAECALIS  Final      Susceptibility   Enterococcus faecalis - MIC*    AMPICILLIN <=2 SENSITIVE Sensitive     VANCOMYCIN 1 SENSITIVE Sensitive     GENTAMICIN SYNERGY SENSITIVE Sensitive     * ENTEROCOCCUS FAECALIS  Blood Culture ID Panel (Reflexed)     Status: Abnormal   Collection Time: 01/03/21  9:17 AM  Result Value Ref Range Status   Enterococcus faecalis DETECTED (A) NOT DETECTED Final    Comment: CRITICAL RESULT CALLED TO, READ BACK BY AND VERIFIED WITH: NATHAN BELUE AT 0623 01/04/21.PMF    Enterococcus Faecium NOT DETECTED NOT DETECTED Final   Listeria monocytogenes NOT DETECTED NOT DETECTED Final   Staphylococcus species NOT DETECTED NOT DETECTED Final   Staphylococcus aureus (BCID) NOT DETECTED NOT DETECTED Final   Staphylococcus epidermidis NOT DETECTED NOT  DETECTED Final   Staphylococcus lugdunensis NOT DETECTED NOT DETECTED Final   Streptococcus species NOT DETECTED NOT DETECTED Final   Streptococcus agalactiae NOT DETECTED NOT DETECTED Final   Streptococcus pneumoniae NOT DETECTED NOT DETECTED Final   Streptococcus pyogenes NOT DETECTED NOT DETECTED Final   A.calcoaceticus-baumannii NOT DETECTED NOT DETECTED  Final   Bacteroides fragilis NOT DETECTED NOT DETECTED Final   Enterobacterales NOT DETECTED NOT DETECTED Final   Enterobacter cloacae complex NOT DETECTED NOT DETECTED Final   Escherichia coli NOT DETECTED NOT DETECTED Final   Klebsiella aerogenes NOT DETECTED NOT DETECTED Final   Klebsiella oxytoca NOT DETECTED NOT DETECTED Final   Klebsiella pneumoniae NOT DETECTED NOT DETECTED Final   Proteus species NOT DETECTED NOT DETECTED Final   Salmonella species NOT DETECTED NOT DETECTED Final   Serratia marcescens NOT DETECTED NOT DETECTED Final   Haemophilus influenzae NOT DETECTED NOT DETECTED Final   Neisseria meningitidis NOT DETECTED NOT DETECTED Final   Pseudomonas aeruginosa NOT DETECTED NOT DETECTED Final   Stenotrophomonas maltophilia NOT DETECTED NOT DETECTED Final   Candida albicans NOT DETECTED NOT DETECTED Final   Candida auris NOT DETECTED NOT DETECTED Final   Candida glabrata NOT DETECTED NOT DETECTED Final   Candida krusei NOT DETECTED NOT DETECTED Final   Candida parapsilosis NOT DETECTED NOT DETECTED Final   Candida tropicalis NOT DETECTED NOT DETECTED Final   Cryptococcus neoformans/gattii NOT DETECTED NOT DETECTED Final   Vancomycin resistance NOT DETECTED NOT DETECTED Final    Comment: Performed at [facility], [address], [place], [place] [address]  Urine Culture     Status: None   Collection Time: 01/04/21  5:21 PM   Specimen: Urine, Clean Catch  Result Value Ref Range Status   Specimen Description   Final    URINE, CLEAN CATCH Performed at [facility], [address],  [place], [place] [address]    Special Requests   Final    NONE Performed at [facility], [address] [place], [place] [address]    Culture   Final    NO GROWTH Performed at [facility], [place] [address] [place], [place] [address]    Report Status 01/06/2021 FINAL  Final  CULTURE, BLOOD (ROUTINE X 2) w Reflex to ID Panel     Status: None (Preliminary result)   Collection Time: 01/06/21  5:28 AM   Specimen: BLOOD  Result Value Ref Range Status   Specimen Description BLOOD LEFT FA  Final   Special Requests   Final    BOTTLES DRAWN AEROBIC AND ANAEROBIC Blood Culture adequate volume   Culture   Final    NO GROWTH 4 DAYS Performed at [facility], [address], [place], [place] [address]    Report Status PENDING  Incomplete  CULTURE, BLOOD (ROUTINE X 2) w Reflex to ID Panel     Status: None (Preliminary result)   Collection Time: 01/06/21  5:29 AM   Specimen: BLOOD  Result Value Ref Range Status   Specimen Description BLOOD LEFT HAND  Final   Special Requests   Final    BOTTLES DRAWN AEROBIC AND ANAEROBIC Blood Culture adequate volume   Culture   Final    NO GROWTH 4 DAYS Performed at [facility], [address] [place],  [address]    Report Status PENDING  Incomplete  MRSA Next Gen by PCR, Nasal     Status: None   Collection Time: 01/09/21  9:50 AM   Specimen: Nasal Mucosa; Nasal Swab  Result Value Ref Range Status   MRSA by PCR Next Gen NOT DETECTED NOT DETECTED Final    Comment: (NOTE) The GeneXpert MRSA Assay (FDA approved for NASAL specimens only), is one component of a comprehensive MRSA colonization surveillance program. It is not intended to  diagnose MRSA infection nor to guide or monitor treatment for MRSA infections. Test performance is not FDA approved in patients less than 77 years old. Performed at [facility], [address], [place], [place] [address]       Radiology Studies: DG Chest [place] 1  View  Result Date: 01/10/2021 CLINICAL DATA:  Acute respiratory failure with hypoxia EXAM: PORTABLE CHEST 1 VIEW COMPARISON:  January 09, 2021 FINDINGS: Diffuse opacities identified throughout bilateral lungs. There are bilateral pleural effusions. Right pleural effusion with likely enlarged compared to prior exam. The mediastinal contour and cardiac silhouette are stable. Bony structures are stable. IMPRESSION: 1. Diffuse opacities identified throughout bilateral lungs concerning for pneumonia or edema unchanged compared prior exam. 2. Bilateral pleural effusions. Right pleural effusion is worsened compared prior exam. Electronically Signed   By: Abelardo Diesel M.D.   On: 01/10/2021 08:43   DG Chest Port 1 View  Result Date: 01/09/2021 CLINICAL DATA:  Acute respiratory failure with hypoxia. EXAM: PORTABLE CHEST 1 VIEW COMPARISON:  January 06, 2021. FINDINGS: The heart size and mediastinal contours are within normal limits. Significantly increased bilateral lung opacities are noted concerning for worsening pneumonia or edema. Small bilateral pleural effusions are noted. The visualized skeletal structures are unremarkable. IMPRESSION: Increased bilateral lung opacities are noted concerning for worsening pneumonia or edema. Electronically Signed   By: Marijo Conception M.D.   On: 01/09/2021 09:49     Scheduled Meds:  aspirin EC  81 mg Oral Daily   Chlorhexidine Gluconate Cloth  6 each Topical Daily   [START ON 01/11/2021] Chlorhexidine Gluconate Cloth  6 each Topical Q0600   cholecalciferol  1,000 Units Oral Daily   feeding supplement  237 mL Oral BID BM   ferrous sulfate  325 mg Oral Q breakfast   heparin injection (subcutaneous)  5,000 Units Subcutaneous Q8H   ipratropium  0.5 mg Nebulization TID   levalbuterol  0.63 mg Nebulization TID   methylPREDNISolone (SOLU-MEDROL) injection  40 mg Intravenous Q12H   metoprolol tartrate  25 mg Oral BID   midazolam       multivitamin with minerals  1  tablet Oral Daily   omega-3 acid ethyl esters  1 g Oral Daily   pantoprazole  40 mg Oral Daily   polyethylene glycol  17 g Oral BID   rosuvastatin  10 mg Oral QHS   Continuous Infusions:  sodium chloride Stopped (01/05/21 1432)   sodium chloride     sodium chloride     piperacillin-tazobactam (ZOSYN)  IV       LOS: 7 days   Critical care time: 35 minutes, severe hypoxia requiring BiPAP, worsening AKI becoming oliguric, multi-organ failure.    Enzo Bi, MD [facility] If 7PM-7AM, please contact night-coverage 01/10/2021, 5:13 PM

## 2021-01-10 NOTE — Progress Notes (Signed)
Patients son informed staff that his mother, the patients wife has tested positive for covid. Patient was put on airborne/contact precautions and also tested for covid. Patient continues on the bipap. Will continue to monitor.

## 2021-01-10 NOTE — Progress Notes (Signed)
Pharmacy Antibiotic Note  Phillip Chavez is a 80 y.o. male admitted on 01/03/2021. Patient with E.faecalis bacteremia on ampicillin. He required transfer to the ICU 11/11 for acute decompensation. His renal function continues to decline. ID is following the patient. Pharmacy has been consulted for vancomycin and cefepime dosing.  -11/11 MRSA PCR: neg -Renal function declining - concern patient may have aspirated   Plan: To cover aspiration and E faecalis bacteremia, change antibiotics to piperacillin/tazobactam 2.25gm IV q6h. As per discussion with ID and PCCM start 11/12 am Will monitor renal function and need to further adjust dose  11/12:  Patient starting temporary HD, assessing daily. Will adjust Zosyn to 2.25gm IV q8h for HD. F/u daily    Height: 5\' 5"  (165.1 cm) Weight: 85.1 kg (187 lb 9.8 oz) IBW/kg (Calculated) : 61.5  Temp (24hrs), Avg:97.8 F (36.6 C), Min:97 F (36.1 C), Max:98.3 F (36.8 C)  Recent Labs  Lab 01/05/21 0522 01/07/21 0443 01/08/21 0450 01/09/21 0543 01/10/21 0500  WBC 8.1 11.3* 7.8 10.1 17.3*  CREATININE 1.48* 1.53* 2.23* 3.48* 5.27*     Estimated Creatinine Clearance: 10.8 mL/min (A) (by C-G formula based on SCr of 5.27 mg/dL (H)).    Allergies  Allergen Reactions   Aggrenox [Aspirin-Dipyridamole Er] Nausea And Vomiting   Atorvastatin Other (See Comments)    Muscle pain   Azithromycin    Cefpodoxime Other (See Comments)    headache   Ciprofloxacin    Claritin [Loratadine] Other (See Comments)    Urinary retention   Contrast Media [Iodinated Diagnostic Agents] Hives   Doxazosin Other (See Comments)    Elevated blood pressure   Doxycycline    Equal [Aspartame] Diarrhea   Fenofibrate Other (See Comments)    Muscle pain   Flomax [Tamsulosin] Other (See Comments)    Elevated blood pressure   Flunisolide Other (See Comments)    Burning sensation   Fluticasone-Salmeterol     Throat irriation   Fluvastatin Other (See Comments)     Muscle pain   Gemfibrozil Other (See Comments)    Muscle pain   Haemophilus Influenzae Vaccines Other (See Comments)    Chest pain   Hydrochlorothiazide Other (See Comments)    weakness   Levofloxacin Other (See Comments)    Nervousness, aggitation   Lisinopril Other (See Comments)    Chest pain   Moxifloxacin Other (See Comments)    Altered behavior   Niacin And Related Other (See Comments)    flushing   Oxycodone    Plavix [Clopidogrel] Other (See Comments)    Constriction in throat   Pravastatin Other (See Comments)    Elevated blood pressure   Simvastatin Other (See Comments)    Muscle pain   Terazosin Other (See Comments)    Fatigue    Zetia [Ezetimibe] Other (See Comments)    Edema    Amoxicillin Palpitations    Chest pain   Mometasone Palpitations    Elevated blood pressure   Sulfa Antibiotics Rash and Hypertension    Antimicrobials this admission: Piperacillin/tazobactam 11/12 >> Vancomycin 11/11 x 1  Cefepime 11/11 x1 Ampicillin 11/7 >>11/11 Unasyn 11/6 >>11/7 Ceftriaxone 11/5 >>11/9   Microbiology results: 11/8 BCx: NG 11/5 BCx: 4/4 E.faecalis 11/6 UCx: NG  11 MRSA PCR: negative  Thank you for allowing pharmacy to be a part of this patient's care.  Doreene Eland, PharmD, BCPS.   Work Cell: [phone] 01/10/2021 1:44 PM

## 2021-01-10 NOTE — Progress Notes (Signed)
PHARMACY NOTE:  RENAL DOSAGE ADJUSTMENT  Current regimen includes a mismatch between dosage and estimated renal function.  As per policy approved by the Pharmacy & Therapeutics and Medical Executive Committees, the dosage will be adjusted accordingly.  Current dosage:  rosuvastatin 40 mg po once daily  Renal Function:  Estimated Creatinine Clearance: 10.8 mL/min (A) (by C-G formula based on SCr of 5.27 mg/dL (H)). []      On intermittent HD, scheduled: []      On CRRT    dosage has been changed to:  rosuvastatin 10 mg po once daily   Thank you for allowing pharmacy to be a part of this patient's care.  Dallie Piles, [facility] 01/10/2021 7:43 AM

## 2021-01-10 NOTE — Procedures (Signed)
Central Venous Catheter Insertion Procedure Note  Phillip Chavez  [id number]  [birth year]  Date:01/10/21  Time:2:42 PM   Provider Performing:Nyima Vanacker A Stark Klein   Procedure: Insertion of Non-tunneled Central Venous [phone]) with US guidance ([id number])   Indication(s) Medication administration, Difficult access, and Hemodialysis  Consent Risks of the procedure as well as the alternatives and risks of each were explained to the patient and/or caregiver.  Consent for the procedure was obtained and is signed in the bedside chart  Anesthesia Topical only with 1% lidocaine   Timeout Verified patient identification, verified procedure, site/side was marked, verified correct patient position, special equipment/implants available, medications/allergies/relevant history reviewed, required imaging and test results available.  Sterile Technique Maximal sterile technique including full sterile barrier drape, hand hygiene, sterile gown, sterile gloves, mask, hair covering, sterile ultrasound probe cover (if used).  Procedure Description Area of catheter insertion was cleaned with chlorhexidine and draped in sterile fashion.  With real-time ultrasound guidance a HD catheter was placed into the right femoral vein. Nonpulsatile blood flow and easy flushing noted in all ports.  The catheter was sutured in place and sterile dressing applied.  Complications/Tolerance None; patient tolerated the procedure well. Chest X-ray is ordered to verify placement for internal jugular or subclavian cannulation.   Chest x-ray is not ordered for femoral cannulation.  EBL Minimal  Specimen(s) None     Rufina Falco, DNP, CCRN, FNP-C, AGACNP-BC Acute Care Nurse Practitioner  [facility] Pager: [phone] [place] at [facility]

## 2021-01-10 NOTE — Progress Notes (Addendum)
NAME:  Phillip Chavez, MRN:  [id number], DOB:  12-12-1940, LOS: 7 ADMISSION DATE:  01/03/2021, CONSULTATION DATE:  01/09/2021 REFERRING MD:  Dr. Billie Ruddy, CHIEF COMPLAINT:  Acute Respiratory Distress   Brief Pt Description / Synopsis:  80 year old male admitted with sepsis due to Enterococcus Faecalis BACTEREMIA in the setting of UTI and community-acquired pneumonia along with acute hypoxic respiratory failure in the setting of acute decompensated CHF.  History of Present Illness:  Phillip Chavez is a 80 year old male with a past medical history significant for COPD (not on home oxygen), recent diagnosis of CHF at the [place], hypertension, hyperlipidemia, GERD who presented to [facility] ED on 01/03/2021 due to complaints of shortness of breath.  Patient is currently somnolent on BiPAP with respiratory distress and unable to contribute to history, therefore history is obtained from ED and nursing notes.  The patient reported that he had been having upper respiratory symptoms for almost a month, he was initially seen at the [place] and diagnosed with upper viral respiratory infection.  This lasted for approximately 10 days and he seemed to have some improvement in symptoms but then symptoms returned in the past week.  Main symptoms include shortness of breath, productive cough with greenish colored sputum, fever, chills, and worsening bilateral lower extremity edema.  He also endorsed orthopnea, right-sided upper back pain.  He denied chest pain, abdominal pain, nausea, vomiting.  ED Course: Initial Vital Signs: temperature 100.4, blood pressure 124/44, heart rate 93, RR 18 Significant Labs: WBC 12.1, negative COVID PCR, troponin level 105, 147, BNP 473, AKI with creatinine 1.54, BUN 17 and GFR 45 (creatinine 1.17 and GFR> 60 on 02/01/2013) Imaging: Chest x-ray showed right middle lobe infiltration and streaks of bilateral basilar opacity  He was admitted by the Hospitalist for further workup and treatment of acute hypoxic  respiratory failure in the setting of CHF exacerbation and community-acquired pneumonia.  Hospital Course: Urine culture and blood cultures were positive for Enterococcus faecalis.  ID was consulted for assistance in treatment of Enterococcus bacteremia, they added ceftriaxone empirically for endocarditis until patient had TEE to rule it out completely.  Cardiology consulted for TEE.   Patient was also having neck and back pain, MRI of cervical, thoracic and lumbar spine ordered to rule out any discitis or osteomyelitis, unable to complete MRI of lumbar spine, pain is mostly in neck and upper back, negative for any osteomyelitis or discitis but did show some foraminal narrowing at cervical level which can be taken care as an outpatient.  TEE neg for vegetation.  On 01/09/2021 he developed acute respiratory distress with increasing FiO2 requirements.  He was placed on BiPAP and transferred to stepdown unit.  Chest x-ray is concerning for pulmonary edema with possible worsening of pneumonia.  PCCM is consulted for further assistance with acute hypoxic respiratory failure.  Pertinent  Medical History  Congestive heart failure COPD GERD Hypertension Hyperlipidemia  Micro Data:  01/03/2021: SARS-CoV-2 and influenza PCR>> negative 01/03/2021: Strep pneumo and Legionella urinary antigens>> negative 01/03/2021: Urine>> Enterococcus faecalis 01/03/2021: Blood culture>> Enterococcus faecalis 01/04/2021: Repeat urine>> no growth 01/06/2021: Repeat blood culture>> no growth to date  Antimicrobials:  Ceftriaxone 11/5>>11/9 Levofloxacin 11/5>>11/6 Unasyn 11/6>>11/7 Ampicillin 11/6>> Cefepime 11/11>> Vancomycin 11/5 x1 dose; restarted 11/11>>  Significant Hospital Events: Including procedures, antibiotic start and stop dates in addition to other pertinent events   01/03/21: Admitted by Hospitalist  01/09/21: Developed acute respiratory distress and hypoxia requiring Bipap and transfer to [employer]  High  risk for intubation.  PCCM consulted.  Nephrology consulted for worsening AKI. 01/10/21: Remains on BiPAP. Creatine worsening. Plan for HD pending Trialysis Catheter  Interim History / Subjective:  -Continue BiPAP, IV Lasix as bp and renal permits, Solumedrol, and Bronchodilators as assess response -I&O since admit +3418 -Creatine worse this am 5.27 up from 3.48 -Increased Leukocytosis  Objective   Blood pressure (!) 139/49, pulse 97, temperature 98 F (36.7 C), temperature source Axillary, resp. rate (!) 22, height 5\' 5"  (1.651 m), weight 85.1 kg, SpO2 97 %.    FiO2 (%):  [65 %-100 %] 65 %   Intake/Output Summary (Last 24 hours) at 01/10/2021 1012 Last data filed at 01/10/2021 0548 Gross per 24 hour  Intake 51.56 ml  Output 90 ml  Net -38.44 ml    Filed Weights   01/08/21 0506 01/09/21 0954 01/10/21 0500  Weight: 81 kg 85 kg 85.1 kg    Examination: General: Critically ill-appearing male, sitting in bed, on BiPAP with severe respiratory distress HENT: Atraumatic, normocephalic, neck supple, no JVD Lungs: Coarse rhonchi bilaterally with expiratory wheezing to lower fields bilaterally, increased work of breathing and accessory muscle use on BiPAP Cardiovascular: Tachycardia, regular rhythm, S1-S2, no murmurs, rubs, gallops Abdomen: Soft, nontender, nondistended, no guarding rebound tenderness, bowel sounds positive x4 Extremities: No deformities, 1+ edema to bilateral lower extremities Neuro: Somnolent, arouses to voice and moans but falls back asleep during conversation, pupils PERRLA GU: Foley catheter placed draining yellow urine  Labs   CBC: Recent Labs  Lab 01/05/21 0522 01/07/21 0443 01/08/21 0450 01/09/21 0543 01/10/21 0500  WBC 8.1 11.3* 7.8 10.1 17.3*  HGB 9.7* 10.2* 9.1* 9.6* 10.7*  HCT 29.4* 31.3* 27.6* 29.5* 34.5*  MCV 88.3 88.4 88.5 86.3 90.6  PLT 204 244 201 244 278     Basic Metabolic Panel: Recent Labs  Lab 01/04/21 0544 01/05/21 0522  01/07/21 0443 01/08/21 0450 01/09/21 0543 01/10/21 0500  NA 133* 133* 135 132* 135 136  K 4.3 4.1 4.1 4.6 4.7 5.9*  CL 104 103 103 103 103 103  CO2 21* 22 22 21* 22 19*  GLUCOSE 111* 194* 106* 149* 140* 133*  BUN 20 17 15  26* 46* 67*  CREATININE 1.44* 1.48* 1.53* 2.23* 3.48* 5.27*  CALCIUM 8.0* 8.1* 8.2* 7.8* 8.3* 8.4*  MG 2.3  --   --  2.1 2.4 2.7*  PHOS  --  3.7 4.1  --   --  9.4*    GFR: Estimated Creatinine Clearance: 10.8 mL/min (A) (by C-G formula based on SCr of 5.27 mg/dL (H)). Recent Labs  Lab 01/07/21 0443 01/08/21 0450 01/09/21 0543 01/09/21 0637 01/10/21 0500  PROCALCITON  --   --   --  0.50 1.61  WBC 11.3* 7.8 10.1  --  17.3*     Liver Function Tests: Recent Labs  Lab 01/05/21 0522 01/07/21 0443  ALBUMIN 2.3* 2.6*    No results for input(s): LIPASE, AMYLASE in the last 168 hours. No results for input(s): AMMONIA in the last 168 hours.  ABG No results found for: PHART, PCO2ART, PO2ART, HCO3, TCO2, ACIDBASEDEF, O2SAT   Coagulation Profile: No results for input(s): INR, PROTIME in the last 168 hours.  Cardiac Enzymes: No results for input(s): CKTOTAL, CKMB, CKMBINDEX, TROPONINI in the last 168 hours.  HbA1C: Hgb A1c MFr Bld  Date/Time Value Ref Range Status  01/05/2021 05:22 AM 6.1 (H) 4.8 - 5.6 % Final    Comment:    (NOTE) Pre diabetes:  5.7%-6.4%  Diabetes:              >6.4%  Glycemic control for   <7.0% adults with diabetes     CBG: Recent Labs  Lab 01/09/21 0953  GLUCAP 267*     Review of Systems:   Unable to assess due to AMS/Somnolence/Respiratory Distress  Past Medical History:  He,  has a past medical history of Chronic CHF (congestive heart failure) ([place]), COPD (chronic obstructive pulmonary disease) ([place]), GERD (gastroesophageal reflux disease), HLD (hyperlipidemia), and HTN (hypertension).   Surgical History:   Past Surgical History:  Procedure Laterality Date   dental procedure     TEE WITHOUT  CARDIOVERSION N/A 01/06/2021   Procedure: TRANSESOPHAGEAL ECHOCARDIOGRAM (TEE);  Surgeon: Minna Merritts, MD;  Location: [facility] ORS;  Service: Cardiovascular;  Laterality: N/A;     Social History:   reports that he quit smoking about 42 years ago. His smoking use included cigarettes. He has a 20.00 pack-year smoking history. He has never used smokeless tobacco. He reports that he does not currently use alcohol. He reports that he does not use drugs.   Family History:  His family history includes Pancreatic cancer in his brother; Prostate cancer in his brother and father.   Allergies Allergies  Allergen Reactions   Aggrenox [Aspirin-Dipyridamole Er] Nausea And Vomiting   Atorvastatin Other (See Comments)    Muscle pain   Azithromycin    Cefpodoxime Other (See Comments)    headache   Ciprofloxacin    Claritin [Loratadine] Other (See Comments)    Urinary retention   Contrast Media [Iodinated Diagnostic Agents] Hives   Doxazosin Other (See Comments)    Elevated blood pressure   Doxycycline    Equal [Aspartame] Diarrhea   Fenofibrate Other (See Comments)    Muscle pain   Flomax [Tamsulosin] Other (See Comments)    Elevated blood pressure   Flunisolide Other (See Comments)    Burning sensation   Fluticasone-Salmeterol     Throat irriation   Fluvastatin Other (See Comments)    Muscle pain   Gemfibrozil Other (See Comments)    Muscle pain   Haemophilus Influenzae Vaccines Other (See Comments)    Chest pain   Hydrochlorothiazide Other (See Comments)    weakness   Levofloxacin Other (See Comments)    Nervousness, aggitation   Lisinopril Other (See Comments)    Chest pain   Moxifloxacin Other (See Comments)    Altered behavior   Niacin And Related Other (See Comments)    flushing   Oxycodone    Plavix [Clopidogrel] Other (See Comments)    Constriction in throat   Pravastatin Other (See Comments)    Elevated blood pressure   Simvastatin Other (See Comments)    Muscle pain    Terazosin Other (See Comments)    Fatigue    Zetia [Ezetimibe] Other (See Comments)    Edema    Amoxicillin Palpitations    Chest pain   Mometasone Palpitations    Elevated blood pressure   Sulfa Antibiotics Rash and Hypertension     Home Medications  Prior to Admission medications   Medication Sig Start Date End Date Taking? Authorizing Provider  albuterol (VENTOLIN HFA) 108 (90 Base) MCG/ACT inhaler Inhale 2 puffs into the lungs every 6 (six) hours as needed for wheezing or shortness of breath.   Yes [provider]  cholecalciferol (VITAMIN D3) 25 MCG (1000 UNIT) tablet Take 1,000 Units by mouth daily. 03/08/13  Yes [provider]  furosemide (  LASIX) 20 MG tablet TAKE ONE TABLET BY MOUTH EVERY DAY FOR FLUID CONTROL, START 10/25 01/01/21  Yes [provider]  losartan (COZAAR) 50 MG tablet Take 25 mg by mouth daily. Pt taking half tabs at each dose for conservation   Yes [provider]  meloxicam (MOBIC) 15 MG tablet Take 15 mg by mouth daily.   Yes [provider]  Multiple Vitamins-Minerals (MULTIVITAMIN ADULTS PO) Take 1 tablet by mouth daily. 04/23/08  Yes [provider]  Omega-3 Fatty Acids (FISH OIL) 1000 MG CAPS TAKE 3 CAPSULES (SEA-OMEGA 30 FISH OIL) BY MOUTH TWO TIMES A DAY FOR CHOLESTEROL 01/08/20  Yes [provider]  omeprazole (PRILOSEC) 20 MG capsule Take 20 mg by mouth daily.   Yes [provider]  pantoprazole (PROTONIX) 40 MG tablet 1 tablet daily. 04/18/20  Yes [provider]  rosuvastatin (CRESTOR) 40 MG tablet Take 40 mg by mouth daily.   Yes [provider]  docusate sodium (COLACE) 100 MG capsule Take 100 mg by mouth 2 (two) times daily. Patient not taking: No sig reported    [provider]  Fluticasone-Salmeterol (ADVAIR) 250-50 MCG/DOSE AEPB Inhale 1 puff into the lungs 2 (two) times daily. Patient not taking: No sig reported    [provider]   ketotifen (ZADITOR) 0.025 % ophthalmic solution 1 drop 2 (two) times daily.    [provider]  pseudoephedrine-acetaminophen (TYLENOL SINUS) 30-500 MG TABS tablet Take 1 tablet by mouth every 4 (four) hours as needed.    [provider]   Scheduled Meds:  aspirin EC  81 mg Oral Daily   Chlorhexidine Gluconate Cloth  6 each Topical Daily   cholecalciferol  1,000 Units Oral Daily   enoxaparin (LOVENOX) injection  30 mg Subcutaneous QHS   feeding supplement  237 mL Oral BID BM   ferrous sulfate  325 mg Oral Q breakfast   ipratropium  0.5 mg Nebulization TID   levalbuterol  0.63 mg Nebulization TID   methylPREDNISolone (SOLU-MEDROL) injection  40 mg Intravenous Q12H   metoprolol tartrate  25 mg Oral BID   multivitamin with minerals  1 tablet Oral Daily   omega-3 acid ethyl esters  1 g Oral Daily   pantoprazole  40 mg Oral Daily   polyethylene glycol  17 g Oral BID   rosuvastatin  10 mg Oral QHS   Continuous Infusions:  sodium chloride Stopped (01/05/21 1432)   piperacillin-tazobactam (ZOSYN)  IV 2.25 g (01/10/21 0532)   PRN Meds:.sodium chloride, acetaminophen, albuterol, dextromethorphan-guaiFENesin, hydrALAZINE, LORazepam **OR** LORazepam, morphine injection, ondansetron (ZOFRAN) IV  Resolved Hospital Problem list     Assessment & Plan:   Acute Hypoxic Respiratory Failure in the setting of Pulmonary Edema, COPD Exacerbation, and Community Acquired Pneumonia -Supplemental O2 as needed to maintain O2 sats >88% -BiPAP, wean as tolerated -High risk for intubation ~ however family feels that pt would most likely not want intubation, especially if prolonged course expected -Received 80 mg IV Lasix -Further diuresis as BP and renal function permits -Bronchodilators -IV Solumedrol -Continue empiric Vancomycin & Cefepime for now due to concern for possible worsening of PNA -Low dose PRN morphine for air hunger -Ensure adequate pulmonary hygiene  Acute  Decompensated CHF with Pulmonary Edema Moderate to Severe Aortic Regurgitation Mildly elevated troponin, suspect demand ischemia PMHx of Hypertension, HLD -Continuous cardiac monitoring -Maintain MAP >65 -Vasopressors as needed to maintain MAP goal -Trend lactic acid until normalized -HS Troponin peaked at 147 -Transesophageal Echocardiogram 01/04/21:  LVEF 63-01%, normal diastolic parameters, RV function normal, moderately elevated pulmonary hypertension, moderate to severe Tricuspid valve regurgitation, moderate to severe aortic valve regurgitation -BNP ~ 3236 -Received 80 mg IV Lasix 11/11 ~ further diuresis as BP and renal function permits -Home Cozaar on hold due to AKI  Severe Sepsis and Enterococcal BACTEREMIA due to UTI and Community Acquired Pneumonia -Monitor fever curve -Trend WBC's & Procalcitonin -Follow cultures as above -ID is following, appreciate input ~ will defer ABX to ID -TEE on 11/8 negative for thrombus or vegetation -MRI of Cervical and Lumbar Spine without evidence of Osteomyelitis discitis -Treated empirically with Ampicillin, Cefepime and Vancomycin changed to Zosyn per ID  Acute Kidney Injury Creatine worsening 5.27 from 3.48 -Monitor I&O's / urinary output -Follow BMP -Ensure adequate renal perfusion -Avoid nephrotoxic agents as able -Replace electrolytes as indicated -Nephrology consulted, Plan for HD per  pending Trialysis catheter placement per Nephro  Anemia of Chronic Disease -Monitor for S/Sx of bleeding -Trend CBC -Lovenox for VTE Prophylaxis  -Transfuse for Hgb <7  Best Practice (right click and "Reselect all SmartList Selections" daily)  Diet/type: NPO DVT prophylaxis: LMWH GI prophylaxis: PPI Lines: N/A Foley:  Yes, and it is still needed Code Status:  full code Last date of multidisciplinary goals of care discussion [01/09/2021]  Pt is critically ill with multiorgan failure.  High risk for intubation, cardiac arrest and death.  Overall prognosis is very poor.  Recommend DNR/DNI status, with consideration for transition to comfort measures.  Pt's wife and son at bedside report that pt has mentioned in the past that he would not want to be resuscitated, however currently not on file.  Supposedly has living will with the [place] and they would like obtain documentation before making DNR/DNI status.  Have reached out to care management to obtain living will from [place].  Critical care time: 35 minutes     Rufina Falco, DNP, CCRN, FNP-C, AGACNP-BC Acute Care Nurse Practitioner  [facility] Pager: [phone] [place] at [facility]

## 2021-01-10 NOTE — Progress Notes (Signed)
[facility]  PROGRESS NOTE   Subjective:   Patient is on BiPAP. Family at bedside. Urine output has been minimal. Patient has history of hypertension, COPD now with enterococcal sepsis. Also developed acute kidney injury oliguric at this time.  Objective:  Vital signs in last 24 hours:  Temp:  [97 F (36.1 C)-98.3 F (36.8 C)] 97.6 F (36.4 C) (11/12 0900) Pulse Rate:  [72-115] 83 (11/12 1100) Resp:  [12-30] 14 (11/12 1100) BP: (102-163)/(37-72) 133/45 (11/12 1100) SpO2:  [90 %-100 %] 93 % (11/12 1100) FiO2 (%):  [65 %-100 %] 65 % (11/12 0900) Weight:  [85.1 kg] 85.1 kg (11/12 0500)  Weight change:  Filed Weights   01/08/21 0506 01/09/21 0954 01/10/21 0500  Weight: 81 kg 85 kg 85.1 kg    Intake/Output: I/O last 3 completed shifts: In: 1320.8 [I.V.:1102.4; IV Piggyback:218.3] Out: 90 [Urine:90]   Intake/Output this shift:  No intake/output data recorded.  Physical Exam: General:  No acute distress  Head:  Normocephalic, atraumatic. Moist oral mucosal membranes  Eyes:  Anicteric  Neck:  Supple  Lungs:   Clear to auscultation, normal effort  Heart:  S1S2 no rubs  Abdomen:   Soft, nontender, bowel sounds present  Extremities:  peripheral edema.  Neurologic:  Awake, alert, following commands  Skin:  No lesions  Access:     Basic Metabolic Panel: Recent Labs  Lab 01/04/21 0544 01/05/21 0522 01/07/21 0443 01/08/21 0450 01/09/21 0543 01/10/21 0500  NA 133* 133* 135 132* 135 136  K 4.3 4.1 4.1 4.6 4.7 5.9*  CL 104 103 103 103 103 103  CO2 21* 22 22 21* 22 19*  GLUCOSE 111* 194* 106* 149* 140* 133*  BUN 20 17 15  26* 46* 67*  CREATININE 1.44* 1.48* 1.53* 2.23* 3.48* 5.27*  CALCIUM 8.0* 8.1* 8.2* 7.8* 8.3* 8.4*  MG 2.3  --   --  2.1 2.4 2.7*  PHOS  --  3.7 4.1  --   --  9.4*    CBC: Recent Labs  Lab 01/05/21 0522 01/07/21 0443 01/08/21 0450 01/09/21 0543 01/10/21 0500  WBC 8.1 11.3* 7.8 10.1 17.3*  HGB 9.7* 10.2* 9.1* 9.6* 10.7*   HCT 29.4* 31.3* 27.6* 29.5* 34.5*  MCV 88.3 88.4 88.5 86.3 90.6  PLT 204 244 201 244 278     Urinalysis: Recent Labs    01/09/21 0952  COLORURINE YELLOW*  LABSPEC 1.023  PHURINE 5.0  GLUCOSEU 50*  HGBUR MODERATE*  BILIRUBINUR NEGATIVE  KETONESUR 5*  PROTEINUR 30*  NITRITE NEGATIVE  LEUKOCYTESUR NEGATIVE      Imaging: DG Chest Port 1 View  Result Date: 01/10/2021 CLINICAL DATA:  Acute respiratory failure with hypoxia EXAM: PORTABLE CHEST 1 VIEW COMPARISON:  January 09, 2021 FINDINGS: Diffuse opacities identified throughout bilateral lungs. There are bilateral pleural effusions. Right pleural effusion with likely enlarged compared to prior exam. The mediastinal contour and cardiac silhouette are stable. Bony structures are stable. IMPRESSION: 1. Diffuse opacities identified throughout bilateral lungs concerning for pneumonia or edema unchanged compared prior exam. 2. Bilateral pleural effusions. Right pleural effusion is worsened compared prior exam. Electronically Signed   By: Abelardo Diesel M.D.   On: 01/10/2021 08:43   DG Chest Port 1 View  Result Date: 01/09/2021 CLINICAL DATA:  Acute respiratory failure with hypoxia. EXAM: PORTABLE CHEST 1 VIEW COMPARISON:  January 06, 2021. FINDINGS: The heart size and mediastinal contours are within normal limits. Significantly increased bilateral lung opacities are noted concerning for worsening pneumonia or  edema. Small bilateral pleural effusions are noted. The visualized skeletal structures are unremarkable. IMPRESSION: Increased bilateral lung opacities are noted concerning for worsening pneumonia or edema. Electronically Signed   By: Marijo Conception M.D.   On: 01/09/2021 09:49     Medications:    sodium chloride Stopped (01/05/21 1432)   piperacillin-tazobactam (ZOSYN)  IV 2.25 g (01/10/21 0532)    aspirin EC  81 mg Oral Daily   Chlorhexidine Gluconate Cloth  6 each Topical Daily   cholecalciferol  1,000 Units Oral Daily    feeding supplement  237 mL Oral BID BM   ferrous sulfate  325 mg Oral Q breakfast   heparin injection (subcutaneous)  5,000 Units Subcutaneous Q8H   ipratropium  0.5 mg Nebulization TID   levalbuterol  0.63 mg Nebulization TID   methylPREDNISolone (SOLU-MEDROL) injection  40 mg Intravenous Q12H   metoprolol tartrate  25 mg Oral BID   multivitamin with minerals  1 tablet Oral Daily   omega-3 acid ethyl esters  1 g Oral Daily   pantoprazole  40 mg Oral Daily   polyethylene glycol  17 g Oral BID   rosuvastatin  10 mg Oral QHS    Assessment/ Plan:     Principal Problem:   Acute respiratory failure with hypoxia (HCC) Active Problems:   CAP (community acquired pneumonia)   Sepsis (HCC)   COPD (chronic obstructive pulmonary disease) (HCC)   GERD (gastroesophageal reflux disease)   HTN (hypertension)   HLD (hyperlipidemia)   CHF exacerbation (HCC)   Elevated troponin   AKI (acute kidney injury) ([place])   Normocytic anemia   Aortic valve regurgitation  80 year old white male with history of hypertension, coronary artery disease, congestive heart failure, COPD and possible chronic kidney disease now admitted with history of worsening shortness of breath and found to have enterococcal sepsis with respiratory failure.  The chest x-ray shows fluid overload.  Patient has been resistant to Lasix.  #1: Acute kidney injury: Acute kidney injury on CKD is most likely secondary to acute tubular necrosis due to septic ATN complicated by CHF leading to decreased cardiac output.  Patient has been diuretic resistant.  Urine output has been minimal.  Spoke to the patient's family at bedside in detail and offered them the renal replacement therapy at for hyperkalemia and volume overload.  They are agreeable to dialysis temporarily.  However they do not want to continue dialysis permanently on this debilitated patient.  Patient also seems to understand the repercussions.  #2: Hyperkalemia: We will dialyze the  patient on 2K bath.  #3: Enterococcal sepsis: Continue antibiotics as ordered.  #4: Acute respiratory failure: Patient is presently on BiPAP.  He also has fluid overload on the chest x-ray.  We will attempt dialysis with fluid removal today.  Case discussed with ICU team and they are agreeable for the plan.  Spoke to the patient's family at bedside in detail and obtain consent for dialysis.   LOS: [place], [facility] 11/12/202212:24 PM

## 2021-01-11 DIAGNOSIS — J9601 Acute respiratory failure with hypoxia: Secondary | ICD-10-CM | POA: Diagnosis not present

## 2021-01-11 LAB — RESP PANEL BY RT-PCR (FLU A&B, COVID) ARPGX2
Influenza A by PCR: NEGATIVE
Influenza B by PCR: NEGATIVE
SARS Coronavirus 2 by RT PCR: POSITIVE — AB

## 2021-01-11 LAB — BLOOD GAS, ARTERIAL
Acid-base deficit: 3.2 mmol/L — ABNORMAL HIGH (ref 0.0–2.0)
Bicarbonate: 21.1 mmol/L (ref 20.0–28.0)
Delivery systems: POSITIVE
Expiratory PAP: 5
FIO2: 0.8
Inspiratory PAP: 8
O2 Saturation: 98.9 %
Patient temperature: 37
pCO2 arterial: 34 mmHg (ref 32.0–48.0)
pH, Arterial: 7.4 (ref 7.350–7.450)
pO2, Arterial: 129 mmHg — ABNORMAL HIGH (ref 83.0–108.0)

## 2021-01-11 LAB — RENAL FUNCTION PANEL
Albumin: 2.4 g/dL — ABNORMAL LOW (ref 3.5–5.0)
Anion gap: 14 (ref 5–15)
BUN: 71 mg/dL — ABNORMAL HIGH (ref 8–23)
CO2: 20 mmol/L — ABNORMAL LOW (ref 22–32)
Calcium: 8 mg/dL — ABNORMAL LOW (ref 8.9–10.3)
Chloride: 102 mmol/L (ref 98–111)
Creatinine, Ser: 5.47 mg/dL — ABNORMAL HIGH (ref 0.61–1.24)
GFR, Estimated: 10 mL/min — ABNORMAL LOW (ref >60)
Glucose, Bld: 153 mg/dL — ABNORMAL HIGH (ref 70–99)
Phosphorus: 8.2 mg/dL — ABNORMAL HIGH (ref 2.5–4.6)
Potassium: 4.8 mmol/L (ref 3.5–5.1)
Sodium: 136 mmol/L (ref 135–145)

## 2021-01-11 LAB — CBC
HCT: 30 % — ABNORMAL LOW (ref 39.0–52.0)
Hemoglobin: 10 g/dL — ABNORMAL LOW (ref 13.0–17.0)
MCH: 28.5 pg (ref 26.0–34.0)
MCHC: 33.3 g/dL (ref 30.0–36.0)
MCV: 85.5 fL (ref 80.0–100.0)
Platelets: 256 10*3/uL (ref 150–400)
RBC: 3.51 MIL/uL — ABNORMAL LOW (ref 4.22–5.81)
RDW: 14.8 % (ref 11.5–15.5)
WBC: 10.9 10*3/uL — ABNORMAL HIGH (ref 4.0–10.5)
nRBC: 0 % (ref 0.0–0.2)

## 2021-01-11 LAB — GLUCOSE, CAPILLARY
Glucose-Capillary: 126 mg/dL — ABNORMAL HIGH (ref 70–99)
Glucose-Capillary: 147 mg/dL — ABNORMAL HIGH (ref 70–99)
Glucose-Capillary: 141 mg/dL — ABNORMAL HIGH (ref 70–99)
Glucose-Capillary: 126 mg/dL — ABNORMAL HIGH (ref 70–99)
Glucose-Capillary: 161 mg/dL — ABNORMAL HIGH (ref 70–99)

## 2021-01-11 LAB — CULTURE, BLOOD (ROUTINE X 2)
Culture: NO GROWTH
Culture: NO GROWTH
Special Requests: ADEQUATE
Special Requests: ADEQUATE

## 2021-01-11 LAB — UREA NITROGEN, URINE: Urea Nitrogen, Ur: 319 mg/dL

## 2021-01-11 LAB — PROCALCITONIN: Procalcitonin: 1.7 ng/mL

## 2021-01-11 LAB — PHOSPHORUS: Phosphorus: 7.2 mg/dL — ABNORMAL HIGH (ref 2.5–4.6)

## 2021-01-11 LAB — MAGNESIUM: Magnesium: 2.4 mg/dL (ref 1.7–2.4)

## 2021-01-11 MED ORDER — HEPARIN SODIUM (PORCINE) 1000 UNIT/ML IJ SOLN
INTRAMUSCULAR | Status: AC
  Filled 2021-01-11: qty 1

## 2021-01-11 MED ORDER — SODIUM CHLORIDE 0.9 % IV SOLN
100.0000 mg | Freq: Every day | INTRAVENOUS | Status: DC
  Administered 2021-01-12 – 2021-01-14 (×3): 100 mg via INTRAVENOUS
  Filled 2021-01-11 (×6): qty 20

## 2021-01-11 MED ORDER — LEVALBUTEROL TARTRATE 45 MCG/ACT IN AERO
2.0000 | INHALATION_SPRAY | Freq: Three times a day (TID) | RESPIRATORY_TRACT | Status: DC
  Administered 2021-01-11 – 2021-01-24 (×32): 2 via RESPIRATORY_TRACT
  Filled 2021-01-11: qty 15

## 2021-01-11 MED ORDER — IPRATROPIUM BROMIDE HFA 17 MCG/ACT IN AERS
2.0000 | INHALATION_SPRAY | Freq: Three times a day (TID) | RESPIRATORY_TRACT | Status: DC
  Administered 2021-01-11 – 2021-01-24 (×32): 2 via RESPIRATORY_TRACT
  Filled 2021-01-11: qty 12.9

## 2021-01-11 MED ORDER — ASCORBIC ACID 500 MG PO TABS
500.0000 mg | ORAL_TABLET | Freq: Every day | ORAL | Status: DC
  Administered 2021-01-12 – 2021-01-24 (×8): 500 mg via ORAL
  Filled 2021-01-11 (×9): qty 1

## 2021-01-11 MED ORDER — ZINC SULFATE 220 (50 ZN) MG PO CAPS
220.0000 mg | ORAL_CAPSULE | Freq: Every day | ORAL | Status: DC
  Administered 2021-01-12 – 2021-01-24 (×9): 220 mg via ORAL
  Filled 2021-01-11 (×13): qty 1

## 2021-01-11 MED ORDER — METHYLPREDNISOLONE SODIUM SUCC 125 MG IJ SOLR
1.0000 mg/kg | Freq: Two times a day (BID) | INTRAMUSCULAR | Status: AC
  Administered 2021-01-11 – 2021-01-13 (×6): 85 mg via INTRAVENOUS
  Filled 2021-01-11 (×6): qty 2

## 2021-01-11 MED ORDER — SODIUM CHLORIDE 0.9 % IV SOLN
200.0000 mg | Freq: Once | INTRAVENOUS | Status: AC
  Administered 2021-01-11: 200 mg via INTRAVENOUS
  Filled 2021-01-11: qty 200

## 2021-01-11 MED ORDER — PREDNISONE 20 MG PO TABS
50.0000 mg | ORAL_TABLET | Freq: Every day | ORAL | Status: DC
  Administered 2021-01-14 – 2021-01-19 (×5): 50 mg via ORAL
  Filled 2021-01-11 (×5): qty 3

## 2021-01-11 NOTE — Progress Notes (Addendum)
NAME:  Phillip Chavez, MRN:  [id number], DOB:  1940/12/15, LOS: 38 ADMISSION DATE:  01/03/2021, CONSULTATION DATE:  01/09/2021 REFERRING MD:  Dr. Billie Ruddy, CHIEF COMPLAINT:  Acute Respiratory Distress   Brief Pt Description / Synopsis:  80 year old male admitted with sepsis due to Enterococcus Faecalis BACTEREMIA in the setting of UTI and community-acquired pneumonia along with acute hypoxic respiratory failure in the setting of acute decompensated CHF.  History of Present Illness:  Phillip Chavez is a 80 year old male with a past medical history significant for COPD (not on home oxygen), recent diagnosis of CHF at the [place], hypertension, hyperlipidemia, GERD who presented to [facility] ED on 01/03/2021 due to complaints of shortness of breath.  Patient is currently somnolent on BiPAP with respiratory distress and unable to contribute to history, therefore history is obtained from ED and nursing notes.  The patient reported that he had been having upper respiratory symptoms for almost a month, he was initially seen at the [place] and diagnosed with upper viral respiratory infection.  This lasted for approximately 10 days and he seemed to have some improvement in symptoms but then symptoms returned in the past week.  Main symptoms include shortness of breath, productive cough with greenish colored sputum, fever, chills, and worsening bilateral lower extremity edema.  He also endorsed orthopnea, right-sided upper back pain.  He denied chest pain, abdominal pain, nausea, vomiting.  ED Course: Initial Vital Signs: temperature 100.4, blood pressure 124/44, heart rate 93, RR 18 Significant Labs: WBC 12.1, negative COVID PCR, troponin level 105, 147, BNP 473, AKI with creatinine 1.54, BUN 17 and GFR 45 (creatinine 1.17 and GFR> 60 on 02/01/2013) Imaging: Chest x-ray showed right middle lobe infiltration and streaks of bilateral basilar opacity  He was admitted by the Hospitalist for further workup and treatment of acute hypoxic  respiratory failure in the setting of CHF exacerbation and community-acquired pneumonia.  Hospital Course: Urine culture and blood cultures were positive for Enterococcus faecalis.  ID was consulted for assistance in treatment of Enterococcus bacteremia, they added ceftriaxone empirically for endocarditis until patient had TEE to rule it out completely.  Cardiology consulted for TEE.   Patient was also having neck and back pain, MRI of cervical, thoracic and lumbar spine ordered to rule out any discitis or osteomyelitis, unable to complete MRI of lumbar spine, pain is mostly in neck and upper back, negative for any osteomyelitis or discitis but did show some foraminal narrowing at cervical level which can be taken care as an outpatient.  TEE neg for vegetation.  On 01/09/2021 he developed acute respiratory distress with increasing FiO2 requirements.  He was placed on BiPAP and transferred to stepdown unit.  Chest x-ray is concerning for pulmonary edema with possible worsening of pneumonia.  PCCM is consulted for further assistance with acute hypoxic respiratory failure.  Pertinent  Medical History  Congestive heart failure COPD GERD Hypertension Hyperlipidemia  Micro Data:  01/03/2021: SARS-CoV-2 and influenza PCR>> negative 01/03/2021: Strep pneumo and Legionella urinary antigens>> negative 01/03/2021: Urine>> Enterococcus faecalis 01/03/2021: Blood culture>> Enterococcus faecalis 01/04/2021: Repeat urine>> no growth 01/06/2021: Repeat blood culture>> no growth to date 01/10/2021: SARS-CoV-2 >POSITIVE  Antimicrobials:  Piperacillin/tazobactam 11/12 >> Vancomycin 11/11 x 1  Cefepime 11/11 x1 Ampicillin 11/7 >>11/11 Unasyn 11/6 >>11/7 Ceftriaxone 11/5 >>11/9  Significant Hospital Events: Including procedures, antibiotic start and stop dates in addition to other pertinent events   01/03/21: Admitted by Hospitalist  01/09/21: Developed acute respiratory distress and hypoxia requiring Bipap  and transfer to  Stepdown. High risk for intubation.  PCCM consulted.  Nephrology consulted for worsening AKI. 01/10/21: Remains on BiPAP. Creatine worsening. Plan for HD pending Trialysis Catheter 01/11/21: Remains on BiPAP. S/p HD yesterday.  Interim History / Subjective:  -Continue BiPAP, IV Lasix as bp and renal permits, Solumedrol, and Bronchodilators as assess response -I&O since admit +[id number] -Creatine worse this am 5.27 up from 3.48, s/p HD 11/12, plan for HD today -Increased Leukocytosis -Patient tested positive for SARS-CoV-2 virus that causes COVID-19  last night. Wife apparently tested COVID  Objective   Blood pressure (!) 96/36, pulse 81, temperature 98.2 F (36.8 C), temperature source Axillary, resp. rate 20, height 5\' 5"  (1.651 m), weight 84.2 kg, SpO2 99 %.    FiO2 (%):  [65 %] 65 %   Intake/Output Summary (Last 24 hours) at 01/11/2021 0948 Last data filed at 01/11/2021 0710 Gross per 24 hour  Intake 74.57 ml  Output 50 ml  Net 24.57 ml    Filed Weights   01/09/21 0954 01/10/21 0500 01/11/21 0500  Weight: 85 kg 85.1 kg 84.2 kg   Physical Examination: General: Critically ill-appearing male, sitting in bed, on BiPAP with severe respiratory distress HENT: Atraumatic, normocephalic, neck supple, no JVD Lungs: Coarse rhonchi bilaterally with expiratory wheezing to lower fields bilaterally, increased work of breathing and accessory muscle use on BiPAP Cardiovascular: Tachycardia, regular rhythm, S1-S2, no murmurs, rubs, gallops Abdomen: Soft, nontender, nondistended, no guarding rebound tenderness, bowel sounds positive x4 Extremities: No deformities, 1+ edema to bilateral lower extremities Neuro: Somnolent, arouses to voice and moans but falls back asleep during conversation, pupils PERRLA GU: Foley catheter in place minimal UOP.  Labs   CBC: Recent Labs  Lab 01/08/21 0450 01/09/21 0543 01/10/21 0500 01/10/21 1400 01/11/21 0422  WBC 7.8 10.1 17.3* 14.7* 10.9*   HGB 9.1* 9.6* 10.7* 10.6* 10.0*  HCT 27.6* 29.5* 34.5* 31.8* 30.0*  MCV 88.5 86.3 90.6 87.8 85.5  PLT 201 244 278 313 256     Basic Metabolic Panel: Recent Labs  Lab 01/07/21 0443 01/08/21 0450 01/09/21 0543 01/10/21 0500 01/10/21 1444 01/11/21 0422 01/11/21 0915  NA 135 132* 135 136 137  --  136  K 4.1 4.6 4.7 5.9* 5.2*  --  4.8  CL 103 103 103 103 101  --  102  CO2 22 21* 22 19* 19*  --  20*  GLUCOSE 106* 149* 140* 133* 157*  --  153*  BUN 15 26* 46* 67* 78*  --  71*  CREATININE 1.53* 2.23* 3.48* 5.27* 6.14*  --  5.47*  CALCIUM 8.2* 7.8* 8.3* 8.4* 8.5*  --  8.0*  MG  --  2.1 2.4 2.7*  --  2.4  --   PHOS 4.1  --   --  9.4* 9.1* 7.2* 8.2*    GFR: Estimated Creatinine Clearance: 10.4 mL/min (A) (by C-G formula based on SCr of 5.47 mg/dL (H)). Recent Labs  Lab 01/09/21 0543 01/09/21 0637 01/10/21 0500 01/10/21 1400 01/11/21 0422  PROCALCITON  --  0.50 1.61  --  1.70  WBC 10.1  --  17.3* 14.7* 10.9*     Liver Function Tests: Recent Labs  Lab 01/05/21 0522 01/07/21 0443 01/10/21 1444 01/11/21 0915  ALBUMIN 2.3* 2.6* 2.7* 2.4*    No results for input(s): LIPASE, AMYLASE in the last 168 hours. No results for input(s): AMMONIA in the last 168 hours.  ABG    Component Value Date/Time   PHART 7.40 01/11/2021 0819   PCO2ART 34  01/11/2021 0819   PO2ART 129 (H) 01/11/2021 0819   HCO3 21.1 01/11/2021 0819   ACIDBASEDEF 3.2 (H) 01/11/2021 0819   O2SAT 98.9 01/11/2021 0819     Coagulation Profile: No results for input(s): INR, PROTIME in the last 168 hours.  Cardiac Enzymes: No results for input(s): CKTOTAL, CKMB, CKMBINDEX, TROPONINI in the last 168 hours.  HbA1C: Hgb A1c MFr Bld  Date/Time Value Ref Range Status  01/05/2021 05:22 AM 6.1 (H) 4.8 - 5.6 % Final    Comment:    (NOTE) Pre diabetes:          5.7%-6.4%  Diabetes:              >6.4%  Glycemic control for   <7.0% adults with diabetes     CBG: Recent Labs  Lab 01/09/21 0953  01/10/21 2339 01/11/21 0403 01/11/21 0752  GLUCAP 267* 121* 126* 147*     Review of Systems:   Unable to assess due to AMS/Somnolence/Respiratory Distress  Past Medical History:  He,  has a past medical history of Chronic CHF (congestive heart failure) ([place]), COPD (chronic obstructive pulmonary disease) ([place]), GERD (gastroesophageal reflux disease), HLD (hyperlipidemia), and HTN (hypertension).   Surgical History:   Past Surgical History:  Procedure Laterality Date   dental procedure     TEE WITHOUT CARDIOVERSION N/A 01/06/2021   Procedure: TRANSESOPHAGEAL ECHOCARDIOGRAM (TEE);  Surgeon: Minna Merritts, MD;  Location: [facility] ORS;  Service: Cardiovascular;  Laterality: N/A;     Social History:   reports that he quit smoking about 42 years ago. His smoking use included cigarettes. He has a 20.00 pack-year smoking history. He has never used smokeless tobacco. He reports that he does not currently use alcohol. He reports that he does not use drugs.   Family History:  His family history includes Pancreatic cancer in his brother; Prostate cancer in his brother and father.   Allergies Allergies  Allergen Reactions   Aggrenox [Aspirin-Dipyridamole Er] Nausea And Vomiting   Atorvastatin Other (See Comments)    Muscle pain   Azithromycin    Cefpodoxime Other (See Comments)    headache   Ciprofloxacin    Claritin [Loratadine] Other (See Comments)    Urinary retention   Contrast Media [Iodinated Diagnostic Agents] Hives   Doxazosin Other (See Comments)    Elevated blood pressure   Doxycycline    Equal [Aspartame] Diarrhea   Fenofibrate Other (See Comments)    Muscle pain   Flomax [Tamsulosin] Other (See Comments)    Elevated blood pressure   Flunisolide Other (See Comments)    Burning sensation   Fluticasone-Salmeterol     Throat irriation   Fluvastatin Other (See Comments)    Muscle pain   Gemfibrozil Other (See Comments)    Muscle pain   Haemophilus Influenzae Vaccines  Other (See Comments)    Chest pain   Hydrochlorothiazide Other (See Comments)    weakness   Levofloxacin Other (See Comments)    Nervousness, aggitation   Lisinopril Other (See Comments)    Chest pain   Moxifloxacin Other (See Comments)    Altered behavior   Niacin And Related Other (See Comments)    flushing   Oxycodone    Plavix [Clopidogrel] Other (See Comments)    Constriction in throat   Pravastatin Other (See Comments)    Elevated blood pressure   Simvastatin Other (See Comments)    Muscle pain   Terazosin Other (See Comments)    Fatigue    Zetia [Ezetimibe]  Other (See Comments)    Edema    Amoxicillin Palpitations    Chest pain   Mometasone Palpitations    Elevated blood pressure   Sulfa Antibiotics Rash and Hypertension     Home Medications  Prior to Admission medications   Medication Sig Start Date End Date Taking? Authorizing Provider  albuterol (VENTOLIN HFA) 108 (90 Base) MCG/ACT inhaler Inhale 2 puffs into the lungs every 6 (six) hours as needed for wheezing or shortness of breath.   Yes [provider]  cholecalciferol (VITAMIN D3) 25 MCG (1000 UNIT) tablet Take 1,000 Units by mouth daily. 03/08/13  Yes [provider]  furosemide (LASIX) 20 MG tablet TAKE ONE TABLET BY MOUTH EVERY DAY FOR FLUID CONTROL, START 10/25 01/01/21  Yes [provider]  losartan (COZAAR) 50 MG tablet Take 25 mg by mouth daily. Pt taking half tabs at each dose for conservation   Yes [provider]  meloxicam (MOBIC) 15 MG tablet Take 15 mg by mouth daily.   Yes [provider]  Multiple Vitamins-Minerals (MULTIVITAMIN ADULTS PO) Take 1 tablet by mouth daily. 04/23/08  Yes [provider]  Omega-3 Fatty Acids (FISH OIL) 1000 MG CAPS TAKE 3 CAPSULES (SEA-OMEGA 30 FISH OIL) BY MOUTH TWO TIMES A DAY FOR CHOLESTEROL 01/08/20  Yes [provider]  omeprazole (PRILOSEC) 20 MG capsule Take 20 mg by mouth daily.   Yes [provider]  pantoprazole (PROTONIX) 40 MG tablet 1 tablet daily. 04/18/20  Yes [provider]  rosuvastatin (CRESTOR) 40 MG tablet Take 40 mg by mouth daily.   Yes [provider]  docusate sodium (COLACE) 100 MG capsule Take 100 mg by mouth 2 (two) times daily. Patient not taking: No sig reported    [provider]  Fluticasone-Salmeterol (ADVAIR) 250-50 MCG/DOSE AEPB Inhale 1 puff into the lungs 2 (two) times daily. Patient not taking: No sig reported    [provider]  ketotifen (ZADITOR) 0.025 % ophthalmic solution 1 drop 2 (two) times daily.    [provider]  pseudoephedrine-acetaminophen (TYLENOL SINUS) 30-500 MG TABS tablet Take 1 tablet by mouth every 4 (four) hours as needed.    [provider]   Scheduled Meds:  vitamin C  500 mg Oral Daily   aspirin EC  81 mg Oral Daily   Chlorhexidine Gluconate Cloth  6 each Topical Daily   Chlorhexidine Gluconate Cloth  6 each Topical Q0600   cholecalciferol  1,000 Units Oral Daily   feeding supplement  237 mL Oral BID BM   ferrous sulfate  325 mg Oral Q breakfast   heparin injection (subcutaneous)  5,000 Units Subcutaneous Q8H   ipratropium  2 puff Inhalation TID   levalbuterol  2 puff Inhalation TID   methylPREDNISolone (SOLU-MEDROL) injection  1 mg/kg Intravenous Q12H   Followed by   Derrill Memo ON 01/14/2021] predniSONE  50 mg Oral Daily   metoprolol tartrate  25 mg Oral BID   multivitamin with minerals  1 tablet Oral Daily   omega-3 acid ethyl esters  1 g Oral Daily   pantoprazole (PROTONIX) IV  40 mg Intravenous Q24H   polyethylene glycol  17 g Oral BID   rosuvastatin  10 mg Oral QHS   zinc sulfate  220 mg Oral Daily   Continuous Infusions:  sodium chloride Stopped (01/05/21 1432)   sodium chloride     sodium chloride     piperacillin-tazobactam (ZOSYN)  IV 100 mL/hr at 01/11/21 0710   [  START ON 01/12/2021] remdesivir 100 mg in NS 100 mL     PRN Meds:.sodium chloride,  sodium chloride, sodium chloride, acetaminophen, albuterol, alteplase, dextromethorphan-guaiFENesin, heparin, hydrALAZINE, lidocaine (PF), lidocaine-prilocaine, LORazepam **OR** LORazepam, morphine injection, ondansetron (ZOFRAN) IV, pentafluoroprop-tetrafluoroeth  Resolved Hospital Problem list     Assessment & Plan:   Acute Hypoxic Respiratory Failure in the setting of Pulmonary Edema, COPD Exacerbation, and Community Acquired Pneumonia AND now with COVID-19 Infection -Supplemental O2 as needed to maintain O2 sats >88% -BiPAP, wean as tolerated -High risk for intubation ~ however family feels that pt would most likely not want intubation, especially if prolonged course expected -As needed bronchodilators -Continue remdesivir x5 days  -Continue steroids. -Encourage OOB, IS, FV, and awake proning if able -Continue airborne, contact precautions for 21 days from positive testing. -Monitor CMP and inflammatory markers -Heparin prophylactic dose.  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, moderate to severe aortic valve regurgitation -BNP ~ 3236 -HD for fluid removal s/p HD 11/12, plan for HD today -Home Cozaar on hold due to AKI  Severe Sepsis and Enterococcal BACTEREMIA due to UTI and Community Acquired Pneumonia -Monitor fever curve -Trend WBC's & Procalcitonin -Follow cultures as above -ID is following, appreciate input ~  will defer ABX to ID -TEE on 11/8 negative for thrombus or vegetation -MRI of Cervical and Lumbar Spine without evidence of Osteomyelitis discitis -Treated empirically with Ampicillin, Cefepime and Vancomycin changed to Zosyn per ID  Acute Kidney Injury Creatine worsening 5.27 from 3.48 -Monitor I&O's / urinary output -Follow BMP -Ensure adequate renal perfusion -Avoid nephrotoxic agents as able -Replace electrolytes as indicated -Intermitted HD initiated. S/p HD 11/12, plan for HD today -Nephrology following, input appreciated  Anemia of Chronic Disease -Monitor for S/Sx of bleeding -Trend CBC -Lovenox for VTE Prophylaxis  -Transfuse for Hgb <7  Best Practice (right click and "Reselect all SmartList Selections" daily)  Diet/type: NPO DVT prophylaxis: LMWH GI prophylaxis: PPI Lines: N/A Foley:  Yes, and it is still needed Code Status:  full code Last date of multidisciplinary goals of care discussion [01/09/2021]  Pt is critically ill with multiorgan failure.  High risk for intubation, cardiac arrest and death. Overall prognosis is very poor.  Recommend DNR/DNI status, with consideration for transition to comfort measures.  Pt's wife and son at bedside report that pt has mentioned in the past that he would not want to be resuscitated, however currently not on file.  Supposedly has living will with the [place] and they would like obtain documentation before making DNR/DNI status.  Have reached out to care management to obtain living will from [place].  Critical care time: 35 minutes     Rufina Falco, DNP, CCRN, FNP-C, AGACNP-BC Acute Care Nurse Practitioner  [facility] Pager: [phone] [place] at [facility]

## 2021-01-11 NOTE — TOC Progression Note (Addendum)
Transition of Care [facility]) - Progression Note    Patient Details  Name: TRIGGER FRASIER MRN: [id number] Date of Birth: [birth year]  Transition of Care [facility]) CM/SW [place], [place] Phone Number: 01/11/2021, 12:17 PM  Clinical Narrative:     Patient remains in ICU, stepdown on BiPap and had HD yesterday 01/10/2021. Patient's spouse tested positive for COVID, patient was tested yesterday 01/10/2021 and is positive for COVID. Main contact [place] (Spouse) [phone]. Patient is active with  [name].  Expected Discharge Plan: Skilled Nursing Facility Barriers to Discharge: Waiting for outpatient dialysis  Expected Discharge Plan and Services Expected Discharge Plan: [place] Choice: [place] arrangements for the past 2 months: Single Family Home                 DME Arranged: [place] DME Agency: [employer]       HH Arranged: PT, RN [place] Agency: [place] ([facility]) Date HH Agency Contacted: 01/07/21 Time [place]: 0909 Representative spoke with at [place]: Ruth ([place]) Interventions    Readmission Risk Interventions No flowsheet data found.

## 2021-01-11 NOTE — Progress Notes (Signed)
[facility]  PROGRESS NOTE   Subjective:   Seen in the ICU.  Events noted. Patient is now COVID-positive. Was dialyzed yesterday.  Objective:  Vital signs in last 24 hours:  Temp:  [98 F (36.7 C)-98.2 F (36.8 C)] 98.2 F (36.8 C) (11/13 0800) Pulse Rate:  [67-125] 71 (11/13 1315) Resp:  [14-28] 20 (11/13 1315) BP: (90-198)/(31-116) 113/36 (11/13 1315) SpO2:  [84 %-100 %] 99 % (11/13 1315) FiO2 (%):  [65 %-80 %] 80 % (11/13 0800) Weight:  [84.2 kg] 84.2 kg (11/13 1149)  Weight change: -0.8 kg Filed Weights   01/10/21 0500 01/11/21 0500 01/11/21 1149  Weight: 85.1 kg 84.2 kg 84.2 kg    Intake/Output: I/O last 3 completed shifts: In: 51.6 [I.V.:33.2; IV Piggyback:18.3] Out: 1610 [Urine:110; Other:1500]   Intake/Output this shift:  Total I/O In: 74.6 [I.V.:1.9; IV Piggyback:72.7] Out: -   Physical Exam: General:  No acute distress  Head:  Normocephalic, atraumatic. Moist oral mucosal membranes  Eyes:  Anicteric  Neck:  Supple  Lungs:   Clear to auscultation, normal effort  Heart:  S1S2 no rubs  Abdomen:   Soft, nontender, bowel sounds present  Extremities:  peripheral edema.  Neurologic:  Awake, alert, following commands  Skin:  No lesions  Access:     Basic Metabolic Panel: Recent Labs  Lab 01/07/21 0443 01/08/21 0450 01/09/21 0543 01/10/21 0500 01/10/21 1444 01/11/21 0422 01/11/21 0915  NA 135 132* 135 136 137  --  136  K 4.1 4.6 4.7 5.9* 5.2*  --  4.8  CL 103 103 103 103 101  --  102  CO2 22 21* 22 19* 19*  --  20*  GLUCOSE 106* 149* 140* 133* 157*  --  153*  BUN 15 26* 46* 67* 78*  --  71*  CREATININE 1.53* 2.23* 3.48* 5.27* 6.14*  --  5.47*  CALCIUM 8.2* 7.8* 8.3* 8.4* 8.5*  --  8.0*  MG  --  2.1 2.4 2.7*  --  2.4  --   PHOS 4.1  --   --  9.4* 9.1* 7.2* 8.2*    CBC: Recent Labs  Lab 01/08/21 0450 01/09/21 0543 01/10/21 0500 01/10/21 1400 01/11/21 0422  WBC 7.8 10.1 17.3* 14.7* 10.9*  HGB 9.1* 9.6* 10.7* 10.6* 10.0*   HCT 27.6* 29.5* 34.5* 31.8* 30.0*  MCV 88.5 86.3 90.6 87.8 85.5  PLT 201 244 278 313 256     Urinalysis: Recent Labs    01/09/21 0952  COLORURINE YELLOW*  LABSPEC 1.023  PHURINE 5.0  GLUCOSEU 50*  HGBUR MODERATE*  BILIRUBINUR NEGATIVE  KETONESUR 5*  PROTEINUR 30*  NITRITE NEGATIVE  LEUKOCYTESUR NEGATIVE      Imaging: DG Chest Port 1 View  Result Date: 01/10/2021 CLINICAL DATA:  Acute respiratory failure with hypoxia EXAM: PORTABLE CHEST 1 VIEW COMPARISON:  January 09, 2021 FINDINGS: Diffuse opacities identified throughout bilateral lungs. There are bilateral pleural effusions. Right pleural effusion with likely enlarged compared to prior exam. The mediastinal contour and cardiac silhouette are stable. Bony structures are stable. IMPRESSION: 1. Diffuse opacities identified throughout bilateral lungs concerning for pneumonia or edema unchanged compared prior exam. 2. Bilateral pleural effusions. Right pleural effusion is worsened compared prior exam. Electronically Signed   By: Abelardo Diesel M.D.   On: 01/10/2021 08:43     Medications:    sodium chloride Stopped (01/05/21 1432)   sodium chloride     sodium chloride     piperacillin-tazobactam (ZOSYN)  IV 100 mL/hr  at 01/11/21 0710   [START ON 01/12/2021] remdesivir 100 mg in NS 100 mL      vitamin C  500 mg Oral Daily   aspirin EC  81 mg Oral Daily   Chlorhexidine Gluconate Cloth  6 each Topical Daily   Chlorhexidine Gluconate Cloth  6 each Topical Q0600   cholecalciferol  1,000 Units Oral Daily   feeding supplement  237 mL Oral BID BM   ferrous sulfate  325 mg Oral Q breakfast   heparin injection (subcutaneous)  5,000 Units Subcutaneous Q8H   heparin sodium (porcine)       ipratropium  2 puff Inhalation TID   levalbuterol  2 puff Inhalation TID   methylPREDNISolone (SOLU-MEDROL) injection  1 mg/kg Intravenous Q12H   Followed by   Derrill Memo ON 01/14/2021] predniSONE  50 mg Oral Daily   metoprolol tartrate  25 mg Oral  BID   multivitamin with minerals  1 tablet Oral Daily   omega-3 acid ethyl esters  1 g Oral Daily   pantoprazole (PROTONIX) IV  40 mg Intravenous Q24H   polyethylene glycol  17 g Oral BID   rosuvastatin  10 mg Oral QHS   zinc sulfate  220 mg Oral Daily    Assessment/ Plan:     Principal Problem:   Acute respiratory failure with hypoxia (HCC) Active Problems:   CAP (community acquired pneumonia)   Sepsis ([place])   COPD (chronic obstructive pulmonary disease) (HCC)   GERD (gastroesophageal reflux disease)   HTN (hypertension)   HLD (hyperlipidemia)   CHF exacerbation (HCC)   Elevated troponin   AKI (acute kidney injury) ([place])   Normocytic anemia   Aortic valve regurgitation  80 year old white male with history of hypertension, coronary artery disease, congestive heart failure, COPD and possible chronic kidney disease now admitted with history of worsening shortness of breath and found to have enterococcal sepsis with respiratory failure.  The chest x-ray shows fluid overload.  Patient has been resistant to Lasix.   #1: Acute kidney injury: Acute kidney injury on CKD is most likely secondary to acute tubular necrosis due to septic ATN complicated by CHF leading to decreased cardiac output.  Patient has been diuretic resistant.  Urine output has been minimal.  Spoke to the patient's family at bedside in detail and offered them the renal replacement therapy at for hyperkalemia and volume overload.  They are agreeable to dialysis temporarily and he was dialyzed last night.  However they do not want to continue dialysis permanently on this debilitated patient.  Patient also seems to understand the repercussions.   #2: Hyperkalemia: We will dialyze the patient on 2K bath.   #3: Enterococcal sepsis: Continue antibiotics as ordered.   #4: Acute respiratory failure: Patient is presently on BiPAP.  He also tested positive for COVID.  He also has fluid overload on the chest x-ray.  We will attempt  dialysis again with fluid removal today.   Case discussed with ICU team and they are agreeable for the plan.  Spoke to the patient's family at bedside in detail and obtain consent for dialysis.   LOS: [place], [facility] 11/13/20221:37 PM

## 2021-01-11 NOTE — Progress Notes (Signed)
Remdesivir - Pharmacy Brief Note   O:  ALT:  CXR:  SpO2: 92 % on BiPAP   A/P:  Remdesivir 200 mg IVPB once followed by 100 mg IVPB daily x 4 days.   Alexzandra Bilton D 01/11/2021 2:43 AM

## 2021-01-11 NOTE — Progress Notes (Signed)
PROGRESS NOTE    ANANTH FIALLOS  IWP:[id number] DOB: 03-Jan-1941 DOA: 01/03/2021 PCP: [facility]  [id number]   Assessment & Plan:   Principal Problem:   Acute respiratory failure with hypoxia ([place]) Active Problems:   CAP (community acquired pneumonia)   Sepsis ([place])   COPD (chronic obstructive pulmonary disease) (HCC)   GERD (gastroesophageal reflux disease)   HTN (hypertension)   HLD (hyperlipidemia)   CHF exacerbation (HCC)   Elevated troponin   AKI (acute kidney injury) ([place])   Normocytic anemia   Aortic valve regurgitation   DARREK Chavez is a 80 y.o. male with medical history significant of hypertension, hyperlipidemia, COPD not on oxygen at home, GERD, recently diagnosed CHF in [place], who presents with shortness of breath.   Patient states that he has been having upper respiratory symptoms of for almost a month. He was initially seen at the [place] and diagnosed with viral upper respiratory tract infection.  This lasted for about 10 days and he seemed to get somewhat better but then symptoms returned in past week.  He has shortness of breath, productive cough with greenish colored sputum production. Patient has fever and chills.  Patient also has worsening bilateral lower extremity edema and orthopnea.  Per patient he was recently diagnosed with CHF but no echo done yet.  He was also hypoxic at 89% on arrival to ED which improved to high 90s on 2 L of oxygen.  He met sepsis criteria with temperature of 100.4, leukocytosis and chest x-ray concerning for right middle lobe infiltrate and streaky bilateral basilar opacities.  AKI with creatinine of 1.64, no recent baseline, prior creatinine in the system was 1.17 in December 2014. Preliminary blood cultures with Enterococcus faecalis. Initially started on Levaquin which were switched to Unasyn . Echo with normal EF, no regional wall motion abnormalities, normal diastolic function and mild aortic stenosis and no  vegetations noted.   ID was consulted for Enterococcus bacteremia, they added ceftriaxone empirically for endocarditis until patient had TEE to rule it out completely.  Cardiology consulted for TEE.   Patient was also having neck and back pain, MRI of cervical, thoracic and lumbar spine ordered to rule out any discitis or osteomyelitis, unable to complete MRI of lumbar spine, pain is mostly in neck and upper back, negative for any osteomyelitis or discitis but did show some foraminal narrowing at cervical level which can be taken care as an outpatient.  TEE neg for vegetation.   Acute hypoxemic respiratory failure 2/2 COVID PNA --acute worsening dyspnea and hypoxia morning of 11/11, needed BiPAP with 100% O2.  Transferred to stepdown.  CXR showed increased bilateral lung opacities are noted concerning for worsening pneumonia or edema.  Wife then was COVID pos, and pt tested pos for COVID on 11/12.  Plan: --cont BiPAP as managed by PCCM --cont IV Remdesivir  Mod to severe aortic regurg --may have a component of pulm edema as well.  Unable to diurese 2/2 AKI.  Sepsis secondary to CAP and Enterococcus faecalis bacteremia, POA   Met sepsis criteria with fever, leukocytosis and concern of pneumonia, later blood cultures came back positive for Enterococcus faecalis.    Enterococcus bacteremia Patient received 1 dose of vancomycin and Rocephin in ED and then started on Levaquin. Levaquin was switched with Unasyn  based on preliminary blood cultures.  D added ceftriaxone empirically for endocarditis Unasyn was switched with ampicillin by ID. Repeat blood cultures negative. --TEE neg for vegetation, ceftriaxone d/c'ed. --abx switched  to zosyn on 11/11 to cover both aspiration and enterococcus. Plan: --cont zosyn  COPD exacerbation --severe dyspnea, increased cough.  started tx for COPD exacerbation on 11/9.  Likely triggered by COVID infection. --cont IV solumedrol --trend CRP --cont Xopenex  and Atrovent scheduled  AKI, worsening, becoming oligouric --Cr went up to 2.23 from 1.53 presumed due to diuresis and dehydration, however, Cr went up even more after gentle MIVF.  Suspect from volume overload.  Pt did not respond to diuresis.  Pt started on temporary dialysis on  --due to poor response to diuresis, nephrology plan to start temporary dialysis on 11/12. --iHD per nephrology  Presumed CAP --received 5 days of ceftriaxone  Elevated troponin.   Peaked at 147 and then trending down.  Likely secondary to demand ischemia with CHF exacerbation.  No chest pain.   Hypertension.   BP trending up --hold home Cozaar and lasix 2/2 AKI --cont Lopressor (new) if can take oral -IV hydralazine as needed   Normocytic anemia:  Hemoglobin 10.6>>9.7.  Anemia panel with anemia of chronic disease and some iron deficiency.  FOBT negative. --cont iron supplement (new) --monitor Hgb --rec outpatient colonoscopy   GERD (gastroesophageal reflux disease) -Protonix   HLD (hyperlipidemia) -Crestor   Generalized weakness. -PT/OT evaluation   DVT prophylaxis: Lovenox SQ Code Status: DNR Family Communication:  Level of care: Stepdown Dispo:   The patient is from: home Anticipated d/c is to: undetermined Anticipated d/c date is: undetermined Patient currently is not medically ready to d/c due to: BiPAP, AKI becoming oliguric, critically ill, multiorgan failure.   Subjective and Interval History:  Wife let the PCCM team know last night that she was covid pos.  Pt was tested and came back pos.  Pt was more alert today, though still on BiPAP.   Objective: Vitals:   01/11/21 1345 01/11/21 1400 01/11/21 1415 01/11/21 1430  BP: (!) 136/39 (!) 120/35 (!) 110/32 (!) 104/30  Pulse: 84 72 67 63  Resp: (!) _0 Temp:      TempSrc:      SpO2: 91% 94% 100% 99%  Weight:      Height:        Intake/Output Summary (Last 24 hours) at 01/11/2021 1515 Last data filed at 01/11/2021  0710 Gross per 24 hour  Intake 74.57 ml  Output 1550 ml  Net -1475.43 ml   Filed Weights   01/10/21 0500 01/11/21 0500 01/11/21 1149  Weight: 85.1 kg 84.2 kg 84.2 kg    Examination:   Constitutional: NAD, awake, receiving dialysis HEENT: conjunctivae and lids normal, EOMI CV: No cyanosis.   RESP: on BiPAP Extremities: No effusions, edema in BLE SKIN: warm, dry   Data Reviewed: I have personally reviewed following labs and imaging studies  CBC: Recent Labs  Lab 01/08/21 0450 01/09/21 0543 01/10/21 0500 01/10/21 1400 01/11/21 0422  WBC 7.8 10.1 17.3* 14.7* 10.9*  HGB 9.1* 9.6* 10.7* 10.6* 10.0*  HCT 27.6* 29.5* 34.5* 31.8* 30.0*  MCV 88.5 86.3 90.6 87.8 85.5  PLT 201 244 278 313 325   Basic Metabolic Panel: Recent Labs  Lab 01/07/21 0443 01/08/21 0450 01/09/21 0543 01/10/21 0500 01/10/21 1444 01/11/21 0422 01/11/21 0915  NA 135 132* 135 136 137  --  136  K 4.1 4.6 4.7 5.9* 5.2*  --  4.8  CL 103 103 103 103 101  --  102  CO2 22 21* 22 19* 19*  --  20*  GLUCOSE 106* 149* 140* 133* 157*  --  153*  BUN 15 26* 46* 67* 78*  --  71*  CREATININE 1.53* 2.23* 3.48* 5.27* 6.14*  --  5.47*  CALCIUM 8.2* 7.8* 8.3* 8.4* 8.5*  --  8.0*  MG  --  2.1 2.4 2.7*  --  2.4  --   PHOS 4.1  --   --  9.4* 9.1* 7.2* 8.2*   GFR: Estimated Creatinine Clearance: 10.4 mL/min (A) (by C-G formula based on SCr of 5.47 mg/dL (H)). Liver Function Tests: Recent Labs  Lab 01/05/21 0522 01/07/21 0443 01/10/21 1444 01/11/21 0915  ALBUMIN 2.3* 2.6* 2.7* 2.4*   No results for input(s): LIPASE, AMYLASE in the last 168 hours. No results for input(s): AMMONIA in the last 168 hours. Coagulation Profile: No results for input(s): INR, PROTIME in the last 168 hours. Cardiac Enzymes: No results for input(s): CKTOTAL, CKMB, CKMBINDEX, TROPONINI in the last 168 hours. BNP (last 3 results) No results for input(s): PROBNP in the last 8760 hours. HbA1C: No results for input(s): HGBA1C in the  last 72 hours.  CBG: Recent Labs  Lab 01/09/21 0953 01/10/21 2339 01/11/21 0403 01/11/21 0752 01/11/21 1125  GLUCAP 267* 121* 126* 147* 141*   Lipid Profile: No results for input(s): CHOL, HDL, LDLCALC, TRIG, CHOLHDL, LDLDIRECT in the last 72 hours. Thyroid Function Tests: Recent Labs    01/09/21 0637  TSH 1.375   Anemia Panel: No results for input(s): VITAMINB12, FOLATE, FERRITIN, TIBC, IRON, RETICCTPCT in the last 72 hours.  Sepsis Labs: Recent Labs  Lab 01/09/21 0637 01/10/21 0500 01/11/21 0422  PROCALCITON 0.50 1.61 1.70    Recent Results (from the past 240 hour(s))  Resp Panel by RT-PCR (Flu A&B, Covid) Nasopharyngeal Swab     Status: None   Collection Time: 01/03/21  6:06 AM   Specimen: Nasopharyngeal Swab; Nasopharyngeal(NP) swabs in vial transport medium  Result Value Ref Range Status   SARS Coronavirus 2 by RT PCR NEGATIVE NEGATIVE Final    Comment: (NOTE) SARS-CoV-2 target nucleic acids are NOT DETECTED.  The SARS-CoV-2 RNA is generally detectable in upper respiratory specimens during the acute phase of infection. The lowest concentration of SARS-CoV-2 viral copies this assay can detect is 138 copies/mL. A negative result does not preclude SARS-Cov-2 infection and should not be used as the sole basis for treatment or other patient management decisions. A negative result may occur with  improper specimen collection/handling, submission of specimen other than nasopharyngeal swab, presence of viral mutation(s) within the areas targeted by this assay, and inadequate number of viral copies(<138 copies/mL). A negative result must be combined with clinical observations, patient history, and epidemiological information. The expected result is Negative.  Fact Sheet for Patients:  EntrepreneurPulse.com.au  Fact Sheet for Healthcare Providers:  IncredibleEmployment.be  This test is no t yet approved or cleared by the [place] FDA and  has been authorized for detection and/or diagnosis of SARS-CoV-2 by FDA under an Emergency Use Authorization (EUA). This EUA will remain  in effect (meaning this test can be used) for the duration of the COVID-19 declaration under Section 564(b)(1) of the Act, 21 U.S.C.section 360bbb-3(b)(1), unless the authorization is terminated  or revoked sooner.       Influenza A by PCR NEGATIVE NEGATIVE Final   Influenza B by PCR NEGATIVE NEGATIVE Final    Comment: (NOTE) The Xpert Xpress SARS-CoV-2/FLU/RSV plus assay is intended as an aid in the diagnosis of influenza from Nasopharyngeal swab specimens and should not be used as a sole basis for treatment.  Nasal washings and aspirates are unacceptable for Xpert Xpress SARS-CoV-2/FLU/RSV testing.  Fact Sheet for Patients: EntrepreneurPulse.com.au  Fact Sheet for Healthcare Providers: IncredibleEmployment.be  This test is not yet approved or cleared by the [place] FDA and has been authorized for detection and/or diagnosis of SARS-CoV-2 by FDA under an Emergency Use Authorization (EUA). This EUA will remain in effect (meaning this test can be used) for the duration of the COVID-19 declaration under Section 564(b)(1) of the Act, 21 U.S.C. section 360bbb-3(b)(1), unless the authorization is terminated or revoked.  Performed at [facility], [address] [place], [place] [address]   Blood culture (routine x 2)     Status: Abnormal   Collection Time: 01/03/21  9:17 AM   Specimen: BLOOD  Result Value Ref Range Status   Specimen Description   Final    BLOOD LEFT ANTECUBITAL Performed at [facility], [address] [place], [place] [address]    Special Requests   Final    BOTTLES DRAWN AEROBIC AND ANAEROBIC Blood Culture adequate volume Performed at [facility], [address] [place], [place] [address]    Culture  Setup Time   Final    GRAM  POSITIVE COCCI IN BOTH AEROBIC AND ANAEROBIC BOTTLES CRITICAL RESULT CALLED TO, READ BACK BY AND VERIFIED WITH: NATHAN BELUE AT 8638 01/04/21.PMF Performed at [facility], [address], [place], [place] [address]    Culture (A)  Final    ENTEROCOCCUS FAECALIS SUSCEPTIBILITIES PERFORMED ON PREVIOUS CULTURE WITHIN THE LAST 5 DAYS. Performed at [facility], [place] [address] [place], [place] [address]    Report Status 01/06/2021 FINAL  Final  Blood culture (routine x 2)     Status: Abnormal   Collection Time: 01/03/21  9:17 AM   Specimen: BLOOD LEFT HAND  Result Value Ref Range Status   Specimen Description   Final    BLOOD LEFT HAND Performed at [facility], [address], [place], [place] [address]    Special Requests   Final    BOTTLES DRAWN AEROBIC AND ANAEROBIC Blood Culture adequate volume Performed at [facility], [address] [place], [place] [address]    Culture  Setup Time   Final    Organism ID to follow GRAM POSITIVE COCCI IN BOTH AEROBIC AND ANAEROBIC BOTTLES CRITICAL RESULT CALLED TO, READ BACK BY AND VERIFIED WITH: NATHAN BELUE AT 9166 01/04/21.PMF Performed at [facility], [address] [place], [place] [address]    Culture ENTEROCOCCUS FAECALIS (A)  Final   Report Status 01/06/2021 FINAL  Final   Organism ID, Bacteria ENTEROCOCCUS FAECALIS  Final      Susceptibility   Enterococcus faecalis - MIC*    AMPICILLIN <=2 SENSITIVE Sensitive     VANCOMYCIN 1 SENSITIVE Sensitive     GENTAMICIN SYNERGY SENSITIVE Sensitive     * ENTEROCOCCUS FAECALIS  Blood Culture ID Panel (Reflexed)     Status: Abnormal   Collection Time: 01/03/21  9:17 AM  Result Value Ref Range Status   Enterococcus faecalis DETECTED (A) NOT DETECTED Final    Comment: CRITICAL RESULT CALLED TO, READ BACK BY AND VERIFIED WITH: NATHAN BELUE AT 5997 01/04/21.PMF    Enterococcus Faecium NOT DETECTED NOT DETECTED Final   Listeria monocytogenes NOT  DETECTED NOT DETECTED Final   Staphylococcus species NOT DETECTED NOT DETECTED Final   Staphylococcus aureus (BCID) NOT DETECTED NOT DETECTED Final   Staphylococcus epidermidis NOT DETECTED NOT DETECTED Final  Staphylococcus lugdunensis NOT DETECTED NOT DETECTED Final   Streptococcus species NOT DETECTED NOT DETECTED Final   Streptococcus agalactiae NOT DETECTED NOT DETECTED Final   Streptococcus pneumoniae NOT DETECTED NOT DETECTED Final   Streptococcus pyogenes NOT DETECTED NOT DETECTED Final   A.calcoaceticus-baumannii NOT DETECTED NOT DETECTED Final   Bacteroides fragilis NOT DETECTED NOT DETECTED Final   Enterobacterales NOT DETECTED NOT DETECTED Final   Enterobacter cloacae complex NOT DETECTED NOT DETECTED Final   Escherichia coli NOT DETECTED NOT DETECTED Final   Klebsiella aerogenes NOT DETECTED NOT DETECTED Final   Klebsiella oxytoca NOT DETECTED NOT DETECTED Final   Klebsiella pneumoniae NOT DETECTED NOT DETECTED Final   Proteus species NOT DETECTED NOT DETECTED Final   Salmonella species NOT DETECTED NOT DETECTED Final   Serratia marcescens NOT DETECTED NOT DETECTED Final   Haemophilus influenzae NOT DETECTED NOT DETECTED Final   Neisseria meningitidis NOT DETECTED NOT DETECTED Final   Pseudomonas aeruginosa NOT DETECTED NOT DETECTED Final   Stenotrophomonas maltophilia NOT DETECTED NOT DETECTED Final   Candida albicans NOT DETECTED NOT DETECTED Final   Candida auris NOT DETECTED NOT DETECTED Final   Candida glabrata NOT DETECTED NOT DETECTED Final   Candida krusei NOT DETECTED NOT DETECTED Final   Candida parapsilosis NOT DETECTED NOT DETECTED Final   Candida tropicalis NOT DETECTED NOT DETECTED Final   Cryptococcus neoformans/gattii NOT DETECTED NOT DETECTED Final   Vancomycin resistance NOT DETECTED NOT DETECTED Final    Comment: Performed at [facility], [address], [place], [place] [address]  Urine Culture     Status: None   Collection Time: 01/04/21   5:21 PM   Specimen: Urine, Clean Catch  Result Value Ref Range Status   Specimen Description   Final    URINE, CLEAN CATCH Performed at [facility], [address], [place], [place] [address]    Special Requests   Final    NONE Performed at [facility], [address], [place], [place] [address]    Culture   Final    NO GROWTH Performed at [facility], [place] [address] [place], [place] [address]    Report Status 01/06/2021 FINAL  Final  CULTURE, BLOOD (ROUTINE X 2) w Reflex to ID Panel     Status: None   Collection Time: 01/06/21  5:28 AM   Specimen: BLOOD  Result Value Ref Range Status   Specimen Description BLOOD LEFT FA  Final   Special Requests   Final    BOTTLES DRAWN AEROBIC AND ANAEROBIC Blood Culture adequate volume   Culture   Final    NO GROWTH 5 DAYS Performed at [facility], [address] [place], [place] [address]    Report Status 01/11/2021 FINAL  Final  CULTURE, BLOOD (ROUTINE X 2) w Reflex to ID Panel     Status: None   Collection Time: 01/06/21  5:29 AM   Specimen: BLOOD  Result Value Ref Range Status   Specimen Description BLOOD LEFT HAND  Final   Special Requests   Final    BOTTLES DRAWN AEROBIC AND ANAEROBIC Blood Culture adequate volume   Culture   Final    NO GROWTH 5 DAYS Performed at [facility], [address] [place], [place] [address]    Report Status 01/11/2021 FINAL  Final  MRSA Next Gen by PCR, Nasal     Status: None   Collection Time: 01/09/21  9:50 AM   Specimen: Nasal Mucosa; Nasal Swab  Result Value Ref Range Status   MRSA by PCR Next Gen NOT DETECTED NOT DETECTED Final    Comment: (NOTE) The GeneXpert MRSA Assay (FDA approved for NASAL specimens only), is one component of a comprehensive MRSA colonization surveillance program. It is not intended to diagnose MRSA infection nor to guide or monitor treatment for MRSA infections. Test performance is not FDA approved in patients less  than 43 years old. Performed at [facility], [address] [place], [place] [address]   Resp Panel by RT-PCR (Flu A&B, Covid) Nasopharyngeal Swab     Status: Abnormal   Collection Time: 01/10/21 10:46 PM   Specimen: Nasopharyngeal Swab; Nasopharyngeal(NP) swabs in vial transport medium  Result Value Ref Range Status   SARS Coronavirus 2 by RT PCR POSITIVE (A) NEGATIVE Final    Comment: RESULT CALLED TO, READ BACK BY AND VERIFIED WITH: ERICKA MCMILLIAN @ 0025 01/11/21 LFD (NOTE) SARS-CoV-2 target nucleic acids are DETECTED.  The SARS-CoV-2 RNA is generally detectable in upper respiratory specimens during the acute phase of infection. Positive results are indicative of the presence of the identified virus, but do not rule out bacterial infection or co-infection with other pathogens not detected by the test. Clinical correlation with patient history and other diagnostic information is necessary to determine patient infection status. The expected result is Negative.  Fact Sheet for Patients: EntrepreneurPulse.com.au  Fact Sheet for Healthcare Providers: IncredibleEmployment.be  This test is not yet approved or cleared by the [place] FDA and  has been authorized for detection and/or diagnosis of SARS-CoV-2 by FDA under an Emergency Use Authorization (EUA).  This EUA will remain in effect (meaning this test can  be used) for the duration of  the COVID-19 declaration under Section 564(b)(1) of the Act, 21 U.S.C. section 360bbb-3(b)(1), unless the authorization is terminated or revoked sooner.     Influenza A by PCR NEGATIVE NEGATIVE Final   Influenza B by PCR NEGATIVE NEGATIVE Final    Comment: (NOTE) The Xpert Xpress SARS-CoV-2/FLU/RSV plus assay is intended as an aid in the diagnosis of influenza from Nasopharyngeal swab specimens and should not be used as a sole basis for treatment. Nasal washings and aspirates are  unacceptable for Xpert Xpress SARS-CoV-2/FLU/RSV testing.  Fact Sheet for Patients: EntrepreneurPulse.com.au  Fact Sheet for Healthcare Providers: IncredibleEmployment.be  This test is not yet approved or cleared by the [place] FDA and has been authorized for detection and/or diagnosis of SARS-CoV-2 by FDA under an Emergency Use Authorization (EUA). This EUA will remain in effect (meaning this test can be used) for the duration of the COVID-19 declaration under Section 564(b)(1) of the Act, 21 U.S.C. section 360bbb-3(b)(1), unless the authorization is terminated or revoked.  Performed at [facility], [address], [place], [place] [address]       Radiology Studies: DG Chest [place] 1 View  Result Date: 01/10/2021 CLINICAL DATA:  Acute respiratory failure with hypoxia EXAM: PORTABLE CHEST 1 VIEW COMPARISON:  January 09, 2021 FINDINGS: Diffuse opacities identified throughout bilateral lungs. There are bilateral pleural effusions. Right pleural effusion with likely enlarged compared to prior exam. The mediastinal contour and cardiac silhouette are stable. Bony structures are stable. IMPRESSION: 1. Diffuse opacities identified throughout bilateral lungs concerning for pneumonia or edema unchanged compared prior exam. 2. Bilateral pleural effusions. Right pleural effusion is worsened compared prior exam. Electronically Signed   By: Abelardo Diesel M.D.   On: 01/10/2021 08:43     Scheduled Meds:  vitamin C  500 mg Oral Daily   aspirin EC  81 mg Oral Daily   Chlorhexidine Gluconate Cloth  6 each Topical Daily   Chlorhexidine Gluconate Cloth  6 each Topical Q0600   cholecalciferol  1,000 Units Oral Daily   feeding supplement  237 mL Oral BID BM   ferrous sulfate  325 mg Oral Q breakfast   heparin injection (subcutaneous)  5,000 Units Subcutaneous Q8H   heparin sodium (porcine)       ipratropium  2 puff Inhalation TID   levalbuterol  2  puff Inhalation TID   methylPREDNISolone (SOLU-MEDROL) injection  1 mg/kg Intravenous Q12H   Followed by   Derrill Memo ON 01/14/2021] predniSONE  50 mg Oral Daily   metoprolol tartrate  25 mg Oral BID   multivitamin with minerals  1 tablet Oral Daily   omega-3 acid ethyl esters  1 g Oral Daily   pantoprazole (PROTONIX) IV  40 mg Intravenous Q24H   polyethylene glycol  17 g Oral BID   rosuvastatin  10 mg Oral QHS   zinc sulfate  220 mg Oral Daily   Continuous Infusions:  sodium chloride Stopped (01/05/21 1432)   sodium chloride     sodium chloride     piperacillin-tazobactam (ZOSYN)  IV 2.25 g (01/11/21 1330)   [START ON 01/12/2021] remdesivir 100 mg in NS 100 mL       LOS: 8 days    Enzo Bi, MD [facility] If 7PM-7AM, please contact night-coverage 01/11/2021, 3:15 PM

## 2021-01-12 DIAGNOSIS — J9601 Acute respiratory failure with hypoxia: Secondary | ICD-10-CM | POA: Diagnosis not present

## 2021-01-12 DIAGNOSIS — R0602 Shortness of breath: Secondary | ICD-10-CM | POA: Diagnosis not present

## 2021-01-12 DIAGNOSIS — I509 Heart failure, unspecified: Secondary | ICD-10-CM | POA: Diagnosis not present

## 2021-01-12 DIAGNOSIS — R7881 Bacteremia: Secondary | ICD-10-CM | POA: Diagnosis not present

## 2021-01-12 DIAGNOSIS — B952 Enterococcus as the cause of diseases classified elsewhere: Secondary | ICD-10-CM | POA: Diagnosis not present

## 2021-01-12 LAB — FERRITIN: Ferritin: 628 ng/mL — ABNORMAL HIGH (ref 24–336)

## 2021-01-12 LAB — HEPATITIS B SURFACE ANTIBODY, QUANTITATIVE: Hep B S AB Quant (Post): <3.1 m[IU]/mL — ABNORMAL LOW (ref >9.9)

## 2021-01-12 LAB — COMPREHENSIVE METABOLIC PANEL
ALT: 69 U/L — ABNORMAL HIGH (ref 0–44)
AST: 44 U/L — ABNORMAL HIGH (ref 15–41)
Albumin: 2.6 g/dL — ABNORMAL LOW (ref 3.5–5.0)
Alkaline Phosphatase: 83 U/L (ref 38–126)
Anion gap: 12 (ref 5–15)
BUN: 63 mg/dL — ABNORMAL HIGH (ref 8–23)
CO2: 24 mmol/L (ref 22–32)
Calcium: 8.3 mg/dL — ABNORMAL LOW (ref 8.9–10.3)
Chloride: 100 mmol/L (ref 98–111)
Creatinine, Ser: 5.05 mg/dL — ABNORMAL HIGH (ref 0.61–1.24)
GFR, Estimated: 11 mL/min — ABNORMAL LOW (ref >60)
Glucose, Bld: 162 mg/dL — ABNORMAL HIGH (ref 70–99)
Potassium: 4.2 mmol/L (ref 3.5–5.1)
Sodium: 136 mmol/L (ref 135–145)
Total Bilirubin: 1 mg/dL (ref 0.3–1.2)
Total Protein: 6.7 g/dL (ref 6.5–8.1)

## 2021-01-12 LAB — CBC
HCT: 30.1 % — ABNORMAL LOW (ref 39.0–52.0)
Hemoglobin: 9.9 g/dL — ABNORMAL LOW (ref 13.0–17.0)
MCH: 28.3 pg (ref 26.0–34.0)
MCHC: 32.9 g/dL (ref 30.0–36.0)
MCV: 86 fL (ref 80.0–100.0)
Platelets: 298 10*3/uL (ref 150–400)
RBC: 3.5 MIL/uL — ABNORMAL LOW (ref 4.22–5.81)
RDW: 15 % (ref 11.5–15.5)
WBC: 10.6 10*3/uL — ABNORMAL HIGH (ref 4.0–10.5)
nRBC: 0 % (ref 0.0–0.2)

## 2021-01-12 LAB — GLUCOSE, CAPILLARY
Glucose-Capillary: 140 mg/dL — ABNORMAL HIGH (ref 70–99)
Glucose-Capillary: 161 mg/dL — ABNORMAL HIGH (ref 70–99)
Glucose-Capillary: 200 mg/dL — ABNORMAL HIGH (ref 70–99)
Glucose-Capillary: 143 mg/dL — ABNORMAL HIGH (ref 70–99)

## 2021-01-12 LAB — D-DIMER, QUANTITATIVE: D-Dimer, Quant: 7.08 ug/mL-FEU — ABNORMAL HIGH (ref 0.00–0.50)

## 2021-01-12 LAB — MAGNESIUM: Magnesium: 2.4 mg/dL (ref 1.7–2.4)

## 2021-01-12 LAB — C-REACTIVE PROTEIN: CRP: 3.3 mg/dL — ABNORMAL HIGH (ref <1.0)

## 2021-01-12 LAB — PHOSPHORUS: Phosphorus: 7.2 mg/dL — ABNORMAL HIGH (ref 2.5–4.6)

## 2021-01-12 MED ORDER — PANTOPRAZOLE SODIUM 40 MG PO TBEC
40.0000 mg | DELAYED_RELEASE_TABLET | Freq: Every day | ORAL | Status: DC
  Administered 2021-01-12 – 2021-01-23 (×11): 40 mg via ORAL
  Filled 2021-01-12 (×12): qty 1

## 2021-01-12 MED ORDER — NEPRO/CARBSTEADY PO LIQD
237.0000 mL | Freq: Three times a day (TID) | ORAL | Status: DC
  Administered 2021-01-12 – 2021-01-22 (×12): 237 mL via ORAL

## 2021-01-12 MED ORDER — RENA-VITE PO TABS
1.0000 | ORAL_TABLET | Freq: Every day | ORAL | Status: DC
  Administered 2021-01-12 – 2021-01-23 (×11): 1 via ORAL
  Filled 2021-01-12 (×12): qty 1

## 2021-01-12 NOTE — Progress Notes (Signed)
[facility]  PROGRESS NOTE   Subjective:   Hemodialysis treatment yesterday. UF of 2 liters. Second treatment yesterday.   Plan on third hemodialysis treatment for today.   Objective:  Vital signs in last 24 hours:  Temp:  [97.9 F (36.6 C)-98.6 F (37 C)] 98 F (36.7 C) (11/14 0200) Pulse Rate:  [63-88] 81 (11/14 1000) Resp:  [15-27] 19 (11/14 1000) BP: (104-158)/(30-116) 139/36 (11/14 1000) SpO2:  [86 %-100 %] 96 % (11/14 1000) Weight:  [82.1 kg] 82.1 kg (11/14 0453)  Weight change: 0 kg Filed Weights   01/11/21 0500 01/11/21 1149 01/12/21 0453  Weight: 84.2 kg 84.2 kg 82.1 kg    Intake/Output: I/O last 3 completed shifts: In: 251.9 [I.V.:1.9; IV Piggyback:250] Out: 3625 [Urine:125; Other:3500]   Intake/Output this shift:  No intake/output data recorded.  Physical Exam: General:  No acute distress  Head:  Normocephalic, atraumatic. Moist oral mucosal membranes  Eyes:  Anicteric  Neck:  Supple  Lungs:   Clear to auscultation, normal effort  Heart:  regular  Abdomen:   Soft, nontender, bowel sounds present  Extremities:  ++ peripheral edema.  Neurologic:  Awake, alert, following commands  Skin:  No lesions  Access: Right femoral temp HD catheter 56/43    Basic Metabolic Panel: Recent Labs  Lab 01/08/21 0450 01/09/21 0543 01/10/21 0500 01/10/21 1444 01/11/21 0422 01/11/21 0915 01/12/21 0436  NA 132* 135 136 137  --  136 136  K 4.6 4.7 5.9* 5.2*  --  4.8 4.2  CL 103 103 103 101  --  102 100  CO2 21* 22 19* 19*  --  20* 24  GLUCOSE 149* 140* 133* 157*  --  153* 162*  BUN 26* 46* 67* 78*  --  71* 63*  CREATININE 2.23* 3.48* 5.27* 6.14*  --  5.47* 5.05*  CALCIUM 7.8* 8.3* 8.4* 8.5*  --  8.0* 8.3*  MG 2.1 2.4 2.7*  --  2.4  --  2.4  PHOS  --   --  9.4* 9.1* 7.2* 8.2* 7.2*     CBC: Recent Labs  Lab 01/09/21 0543 01/10/21 0500 01/10/21 1400 01/11/21 0422 01/12/21 0436  WBC 10.1 17.3* 14.7* 10.9* 10.6*  HGB 9.6* 10.7* 10.6* 10.0*  9.9*  HCT 29.5* 34.5* 31.8* 30.0* 30.1*  MCV 86.3 90.6 87.8 85.5 86.0  PLT 244 278 313 256 298      Urinalysis: No results for input(s): COLORURINE, LABSPEC, PHURINE, GLUCOSEU, HGBUR, BILIRUBINUR, KETONESUR, PROTEINUR, UROBILINOGEN, NITRITE, LEUKOCYTESUR in the last 72 hours.  Invalid input(s): APPERANCEUR     Imaging: No results found.   Medications:    sodium chloride Stopped (01/05/21 1432)   sodium chloride     sodium chloride     piperacillin-tazobactam (ZOSYN)  IV 100 mL/hr at 01/12/21 0454   remdesivir 100 mg in NS 100 mL 100 mg (01/12/21 1100)    vitamin C  500 mg Oral Daily   aspirin EC  81 mg Oral Daily   Chlorhexidine Gluconate Cloth  6 each Topical Daily   Chlorhexidine Gluconate Cloth  6 each Topical Q0600   cholecalciferol  1,000 Units Oral Daily   feeding supplement  237 mL Oral BID BM   ferrous sulfate  325 mg Oral Q breakfast   heparin injection (subcutaneous)  5,000 Units Subcutaneous Q8H   ipratropium  2 puff Inhalation TID   levalbuterol  2 puff Inhalation TID   methylPREDNISolone (SOLU-MEDROL) injection  1 mg/kg Intravenous Q12H   Followed by   [  START ON 01/14/2021] predniSONE  50 mg Oral Daily   metoprolol tartrate  25 mg Oral BID   multivitamin with minerals  1 tablet Oral Daily   omega-3 acid ethyl esters  1 g Oral Daily   pantoprazole  40 mg Oral QHS   polyethylene glycol  17 g Oral BID   rosuvastatin  10 mg Oral QHS   zinc sulfate  220 mg Oral Daily    Assessment/ Plan:     Principal Problem:   Acute respiratory failure with hypoxia (HCC) Active Problems:   CAP (community acquired pneumonia)   Sepsis ([place])   COPD (chronic obstructive pulmonary disease) (HCC)   GERD (gastroesophageal reflux disease)   HTN (hypertension)   HLD (hyperlipidemia)   CHF exacerbation (HCC)   Elevated troponin   AKI (acute kidney injury) ([place])   Normocytic anemia   Aortic valve regurgitation  [name] is a 80 y.o. white male with  hypertension, coronary artery disease, congestive heart failure, COPD, GERD, hyperlipidemia, admitted to [facility] on CAP (community acquired pneumonia) [J18.9] AKI (acute kidney injury) ([place]) [N17.9] Community acquired pneumonia, unspecified laterality [J18.9] Congestive heart failure, unspecified HF chronicity, unspecified heart failure type ([place]) [I50.9]    #1: Acute kidney injury with hyperkalemia: on chronic kidney disease stage IIIB, baseline 1.64, GFR of 42 on admission.  Requiring hemodialysis. Resistant furosemide.  - Third treatment for today. 2K bath - Monitor renal function.   #2. Hypertension: with chronic kidney disease. 158/57.  - Continue metoprolol.   #3. Sepsis: E. Faecalis on blood cultures on 11/5.  - IV pip/tazo  #4. Acute respiratory failure with COVID-19 infection and pulmonary edema  - Hemodialysis for today  - IV remdesivir.  - Continue supportive care.     LOS: [place] 11/14/202212:28 PM

## 2021-01-12 NOTE — Progress Notes (Signed)
Date of Admission:  01/03/2021    ID: Phillip Chavez is a 80 y.o. male  Principal Problem:   Acute respiratory failure with hypoxia ([place]) Active Problems:   CAP (community acquired pneumonia)   Sepsis ([place])   COPD (chronic obstructive pulmonary disease) (HCC)   GERD (gastroesophageal reflux disease)   HTN (hypertension)   HLD (hyperlipidemia)   CHF exacerbation (HCC)   Elevated troponin   AKI (acute kidney injury) ([place])   Normocytic anemia   Aortic valve regurgitation  Pt was diagnosed with COVID on 01/10/21 as his wife was positive He is getting remdisivir Started on dialysis over the weekend for worsening creatinine Says he is feeling better Medications:   vitamin C  500 mg Oral Daily   aspirin EC  81 mg Oral Daily   Chlorhexidine Gluconate Cloth  6 each Topical Daily   Chlorhexidine Gluconate Cloth  6 each Topical Q0600   cholecalciferol  1,000 Units Oral Daily   feeding supplement (NEPRO CARB STEADY)  237 mL Oral TID BM   ferrous sulfate  325 mg Oral Q breakfast   heparin injection (subcutaneous)  5,000 Units Subcutaneous Q8H   ipratropium  2 puff Inhalation TID   levalbuterol  2 puff Inhalation TID   methylPREDNISolone (SOLU-MEDROL) injection  1 mg/kg Intravenous Q12H   Followed by   Derrill Memo ON 01/14/2021] predniSONE  50 mg Oral Daily   metoprolol tartrate  25 mg Oral BID   multivitamin  1 tablet Oral QHS   multivitamin with minerals  1 tablet Oral Daily   omega-3 acid ethyl esters  1 g Oral Daily   pantoprazole  40 mg Oral QHS   polyethylene glycol  17 g Oral BID   rosuvastatin  10 mg Oral QHS   zinc sulfate  220 mg Oral Daily    Objective: Vital signs in last 24 hours: Temp:  [97.5 F (36.4 C)-98.1 F (36.7 C)] 97.5 F (36.4 C) (11/14 1600) Pulse Rate:  [61-94] 78 (11/14 1800) Resp:  [10-27] 19 (11/14 1800) BP: (111-158)/(32-95) 126/40 (11/14 1800) SpO2:  [86 %-99 %] 89 % (11/14 1800) Weight:  [82.1 kg] 82.1 kg (11/14 0453)  PHYSICAL EXAM:  General:  awake and alert- on nasal cannula  Lungs: b/l air entry -.crepts  Heart: Regular rate and rhythm, no murmur, rub or gallop. Abdomen: Soft, non-tender,not distended. Bowel sounds normal. No masses Extremities: edema lower extremities Rt femoral Dialysis cath Skin: No rashes or lesions. Or bruising Lymph: Cervical, supraclavicular normal. Neurologic: Grossly non-focal  Lab Results Recent Labs    01/11/21 0422 01/11/21 0915 01/12/21 0436  WBC 10.9*  --  10.6*  HGB 10.0*  --  9.9*  HCT 30.0*  --  30.1*  NA  --  136 136  K  --  4.8 4.2  CL  --  102 100  CO2  --  20* 24  BUN  --  71* 63*  CREATININE  --  5.47* 5.05*   Liver Panel Recent Labs    01/11/21 0915 01/12/21 0436  PROT  --  6.7  ALBUMIN 2.4* 2.6*  AST  --  44*  ALT  --  69*  ALKPHOS  --  58  BILITOT  --  1.0    Microbiology: 01/03/21 BC- enterococcus fecalis 01/06/21 Repeat BC NG UC NG Studies/Results: 01/10/21  Diffuse opacities identified throughout bilateral lungs concerning for pneumonia or edema unchanged compared prior exam. 2. Bilateral pleural effusions. Right pleural effusion is worsened compared prior exam.  Assessment/Plan: ?pt presenting with sob and cough for the past month- intially started with fever and was diagnosed as a viral infection a month ago at [place]  Now has SOB on exertion, orthopnea PND HE has underlying emphysema Asbestos pleural plaque  Worsening hypoxia with resp failure- transferred to ICU on BIPAP CXR pulmonary edema VS aspiration pneumonia- BNP > 3000.received 80mg  IV lasix- antibiotic escalated to vanco and cefepime bu ICU team- changed to zosyn on 01/09/21 Pt improving since started on dialysis  New COVID infection- could be contributing to the hypoxia- on remdisvir   Enterococcus bacteremia- with symptoms of new onset CHF -TEE done- no valvular vegetation. There is moderately elevated pulmonary artery systolic  pressure.of56.5 mmHg.   4. Left atrial size was  moderately dilated. No left atrial/left atrial appendage thrombus was detected.  Moderate aortic regurgitation No hardware, no pacemaker or prosthetic valves Has Aortic regurgitation  Source of enterococcus unclear ( it is a gi pathogen) UC Neg post void bladder scan was 365- repeat 127 Has had compression fracture thoracic spine and pain back and neck- MRI thoracic and cervical spine shows no discitis /infection though limited by absent contrast TEE no endocarditis Will get appropriate antibiotic until 01/17/21  Pt has constipation alternating with  diarrhea ( more constipation) since Aug 2022 CT ABD/PELVIS WO CONTRAST done on 10/18/20 showed 1. Large amount of fecal content within the rectal vault may be consistent with constipation in the correct clinical setting. 2. Wall thickening of the urinary bladder. 3. Prominence of the prostate gland. 4. Cholelithiasis.  Stool wbc negative- so diarrhea is not related to any inflammatory cause    AKI- worsening- started HD   Anemia-   Discussed the management with ICU team

## 2021-01-12 NOTE — Progress Notes (Signed)
NAME:  MAJOR SANTERRE, MRN:  [id number], DOB:  1940-05-12, LOS: 65 ADMISSION DATE:  01/03/2021, CONSULTATION DATE:  01/09/2021 REFERRING MD:  Dr. Billie Ruddy, CHIEF COMPLAINT:  Acute Respiratory Distress   Brief Pt Description / Synopsis:  80 year old male admitted with sepsis due to Enterococcus Faecalis BACTEREMIA in the setting of UTI and community-acquired pneumonia along with acute hypoxic respiratory failure in the setting of acute decompensated CHF.  History of Present Illness:  Bronte Kropf is a 80 year old male with a past medical history significant for COPD (not on home oxygen), recent diagnosis of CHF at the [place], hypertension, hyperlipidemia, GERD who presented to [facility] ED on 01/03/2021 due to complaints of shortness of breath.  Patient is currently somnolent on BiPAP with respiratory distress and unable to contribute to history, therefore history is obtained from ED and nursing notes.  The patient reported that he had been having upper respiratory symptoms for almost a month, he was initially seen at the [place] and diagnosed with upper viral respiratory infection.  This lasted for approximately 10 days and he seemed to have some improvement in symptoms but then symptoms returned in the past week.  Main symptoms include shortness of breath, productive cough with greenish colored sputum, fever, chills, and worsening bilateral lower extremity edema.  He also endorsed orthopnea, right-sided upper back pain.  He denied chest pain, abdominal pain, nausea, vomiting.  ED Course: Initial Vital Signs: temperature 100.4, blood pressure 124/44, heart rate 93, RR 18 Significant Labs: WBC 12.1, negative COVID PCR, troponin level 105, 147, BNP 473, AKI with creatinine 1.54, BUN 17 and GFR 45 (creatinine 1.17 and GFR> 60 on 02/01/2013) Imaging: Chest x-ray showed right middle lobe infiltration and streaks of bilateral basilar opacity  He was admitted by the Hospitalist for further workup and treatment of acute hypoxic  respiratory failure in the setting of CHF exacerbation and community-acquired pneumonia.  Hospital Course: Urine culture and blood cultures were positive for Enterococcus faecalis.  ID was consulted for assistance in treatment of Enterococcus bacteremia, they added ceftriaxone empirically for endocarditis until patient had TEE to rule it out completely.  Cardiology consulted for TEE.   Patient was also having neck and back pain, MRI of cervical, thoracic and lumbar spine ordered to rule out any discitis or osteomyelitis, unable to complete MRI of lumbar spine, pain is mostly in neck and upper back, negative for any osteomyelitis or discitis but did show some foraminal narrowing at cervical level which can be taken care as an outpatient.  TEE neg for vegetation.  On 01/09/2021 he developed acute respiratory distress with increasing FiO2 requirements.  He was placed on BiPAP and transferred to stepdown unit.  Chest x-ray is concerning for pulmonary edema with possible worsening of pneumonia.  PCCM is consulted for further assistance with acute hypoxic respiratory failure.  Pertinent  Medical History  Congestive heart failure COPD GERD Hypertension Hyperlipidemia  Micro Data:  01/03/2021: SARS-CoV-2 and influenza PCR>> negative 01/03/2021: Strep pneumo and Legionella urinary antigens>> negative 01/03/2021: Urine>> Enterococcus faecalis 01/03/2021: Blood culture>> Enterococcus faecalis 01/04/2021: Repeat urine>> no growth 01/06/2021: Repeat blood culture>> no growth  01/10/2021: SARS-CoV-2 >POSITIVE  Antimicrobials:  Piperacillin/tazobactam 11/12 >> Remdesivir 11/13 (Plan for 5 day course)>> Vancomycin 11/11 x 1  Cefepime 11/11 x1 Ampicillin 11/7 >>11/11 Unasyn 11/6 >>11/7 Ceftriaxone 11/5 >>11/9  Significant Hospital Events: Including procedures, antibiotic start and stop dates in addition to other pertinent events   01/03/21: Admitted by Hospitalist  01/09/21: Developed acute respiratory  distress and  hypoxia requiring Bipap and transfer to Stepdown. High risk for intubation.  PCCM consulted.  Nephrology consulted for worsening AKI. 01/10/21: Remains on BiPAP. Creatine worsening. Plan for HD pending Trialysis Catheter 01/11/21: Remains on BiPAP. S/p HD yesterday. 01/12/21: Weaned off BiPAP and tolerating.  Plan for HD again today.  Critical Care needs resolved, PCCM to sign off  Interim History / Subjective:  -No significant events reported overnight  -Afebrile, hemodynamically stable, NO vasopressors -Weaned off BiPAP to 8L HFNC, currently tolerating -Received HD yesterday, UOP 70 cc last 24 hrs ~ plan for HD again today per Nephrology -Leukocytosis stable at 10.6 K (10.9) -Critical Care needs have resolved ~ PCCM will sign off  Objective   Blood pressure (!) 140/46, pulse 79, temperature 98 F (36.7 C), resp. rate (!) 23, height 5\' 5"  (1.651 m), weight 82.1 kg, SpO2 96 %.        Intake/Output Summary (Last 24 hours) at 01/12/2021 0848 Last data filed at 01/12/2021 0454 Gross per 24 hour  Intake 177.28 ml  Output 2075 ml  Net -1897.72 ml    Filed Weights   01/11/21 0500 01/11/21 1149 01/12/21 0453  Weight: 84.2 kg 84.2 kg 82.1 kg   Physical Examination: General: Acutely ill-appearing male, sitting in bed, on HFNC, in NAD HENT: Atraumatic, normocephalic, neck supple, no JVD Lungs: Clear with mild expiratory wheezing bilaterally with expiratory wheezing, even, nonlabored Cardiovascular: Regular rate & rhythm, S1-S2, no murmurs, rubs, gallops Abdomen: Soft, nontender, nondistended, no guarding rebound tenderness, bowel sounds positive x4 Extremities: No deformities, 1+ edema to bilateral lower extremities Neuro: Awake and alert, oriented to person, place, and situation. Follows commands, no focal deficits. pupils PERRLA GU: Foley catheter in place minimal UOP.  Labs   CBC: Recent Labs  Lab 01/09/21 0543 01/10/21 0500 01/10/21 1400 01/11/21 0422  01/12/21 0436  WBC 10.1 17.3* 14.7* 10.9* 10.6*  HGB 9.6* 10.7* 10.6* 10.0* 9.9*  HCT 29.5* 34.5* 31.8* 30.0* 30.1*  MCV 86.3 90.6 87.8 85.5 86.0  PLT 244 278 313 256 298     Basic Metabolic Panel: Recent Labs  Lab 01/08/21 0450 01/09/21 0543 01/10/21 0500 01/10/21 1444 01/11/21 0422 01/11/21 0915 01/12/21 0436  NA 132* 135 136 137  --  136 136  K 4.6 4.7 5.9* 5.2*  --  4.8 4.2  CL 103 103 103 101  --  102 100  CO2 21* 22 19* 19*  --  20* 24  GLUCOSE 149* 140* 133* 157*  --  153* 162*  BUN 26* 46* 67* 78*  --  71* 63*  CREATININE 2.23* 3.48* 5.27* 6.14*  --  5.47* 5.05*  CALCIUM 7.8* 8.3* 8.4* 8.5*  --  8.0* 8.3*  MG 2.1 2.4 2.7*  --  2.4  --  2.4  PHOS  --   --  9.4* 9.1* 7.2* 8.2* 7.2*    GFR: Estimated Creatinine Clearance: 11.1 mL/min (A) (by C-G formula based on SCr of 5.05 mg/dL (H)). Recent Labs  Lab 01/09/21 0637 01/10/21 0500 01/10/21 1400 01/11/21 0422 01/12/21 0436  PROCALCITON 0.50 1.61  --  1.70  --   WBC  --  17.3* 14.7* 10.9* 10.6*     Liver Function Tests: Recent Labs  Lab 01/07/21 0443 01/10/21 1444 01/11/21 0915 01/12/21 0436  AST  --   --   --  44*  ALT  --   --   --  69*  ALKPHOS  --   --   --  83  BILITOT  --   --   --  1.0  PROT  --   --   --  6.7  ALBUMIN 2.6* 2.7* 2.4* 2.6*    No results for input(s): LIPASE, AMYLASE in the last 168 hours. No results for input(s): AMMONIA in the last 168 hours.  ABG    Component Value Date/Time   PHART 7.40 01/11/2021 0819   PCO2ART 34 01/11/2021 0819   PO2ART 129 (H) 01/11/2021 0819   HCO3 21.1 01/11/2021 0819   ACIDBASEDEF 3.2 (H) 01/11/2021 0819   O2SAT 98.9 01/11/2021 0819     Coagulation Profile: No results for input(s): INR, PROTIME in the last 168 hours.  Cardiac Enzymes: No results for input(s): CKTOTAL, CKMB, CKMBINDEX, TROPONINI in the last 168 hours.  HbA1C: Hgb A1c MFr Bld  Date/Time Value Ref Range Status  01/05/2021 05:22 AM 6.1 (H) 4.8 - 5.6 % Final    Comment:     (NOTE) Pre diabetes:          5.7%-6.4%  Diabetes:              >6.4%  Glycemic control for   <7.0% adults with diabetes     CBG: Recent Labs  Lab 01/11/21 1125 01/11/21 1617 01/11/21 2117 01/12/21 0111 01/12/21 0800  GLUCAP 141* 126* 161* 140* 161*     Review of Systems:   Positives in BOLD: Gen: Denies fever, chills, weight change, fatigue, night sweats HEENT: Denies blurred vision, double vision, hearing loss, tinnitus, sinus congestion, rhinorrhea, sore throat, neck stiffness, dysphagia PULM: Denies shortness of breath, cough, sputum production, hemoptysis, wheezing CV: Denies chest pain, edema, orthopnea, paroxysmal nocturnal dyspnea, palpitations GI: Denies abdominal pain, nausea, vomiting, diarrhea, hematochezia, melena, constipation, change in bowel habits GU: Denies dysuria, hematuria, polyuria, oliguria, urethral discharge Endocrine: Denies hot or cold intolerance, polyuria, polyphagia or appetite change Derm: Denies rash, dry skin, scaling or peeling skin change Heme: Denies easy bruising, bleeding, bleeding gums Neuro: Denies headache, numbness, weakness, slurred speech, loss of memory or consciousness   Past Medical History:  He,  has a past medical history of Chronic CHF (congestive heart failure) ([place]), COPD (chronic obstructive pulmonary disease) ([place]), GERD (gastroesophageal reflux disease), HLD (hyperlipidemia), and HTN (hypertension).   Surgical History:   Past Surgical History:  Procedure Laterality Date   dental procedure     TEE WITHOUT CARDIOVERSION N/A 01/06/2021   Procedure: TRANSESOPHAGEAL ECHOCARDIOGRAM (TEE);  Surgeon: Minna Merritts, MD;  Location: [facility] ORS;  Service: Cardiovascular;  Laterality: N/A;     Social History:   reports that he quit smoking about 42 years ago. His smoking use included cigarettes. He has a 20.00 pack-year smoking history. He has never used smokeless tobacco. He reports that he does not currently use  alcohol. He reports that he does not use drugs.   Family History:  His family history includes Pancreatic cancer in his brother; Prostate cancer in his brother and father.   Allergies Allergies  Allergen Reactions   Aggrenox [Aspirin-Dipyridamole Er] Nausea And Vomiting   Atorvastatin Other (See Comments)    Muscle pain   Azithromycin    Cefpodoxime Other (See Comments)    headache   Ciprofloxacin    Claritin [Loratadine] Other (See Comments)    Urinary retention   Contrast Media [Iodinated Diagnostic Agents] Hives   Doxazosin Other (See Comments)    Elevated blood pressure   Doxycycline    Equal [Aspartame] Diarrhea   Fenofibrate Other (See Comments)    Muscle pain   Flomax [Tamsulosin] Other (See Comments)  Elevated blood pressure   Flunisolide Other (See Comments)    Burning sensation   Fluticasone-Salmeterol     Throat irriation   Fluvastatin Other (See Comments)    Muscle pain   Gemfibrozil Other (See Comments)    Muscle pain   Haemophilus Influenzae Vaccines Other (See Comments)    Chest pain   Hydrochlorothiazide Other (See Comments)    weakness   Levofloxacin Other (See Comments)    Nervousness, aggitation   Lisinopril Other (See Comments)    Chest pain   Moxifloxacin Other (See Comments)    Altered behavior   Niacin And Related Other (See Comments)    flushing   Oxycodone    Plavix [Clopidogrel] Other (See Comments)    Constriction in throat   Pravastatin Other (See Comments)    Elevated blood pressure   Simvastatin Other (See Comments)    Muscle pain   Terazosin Other (See Comments)    Fatigue    Zetia [Ezetimibe] Other (See Comments)    Edema    Amoxicillin Palpitations    Chest pain   Mometasone Palpitations    Elevated blood pressure   Sulfa Antibiotics Rash and Hypertension     Home Medications  Prior to Admission medications   Medication Sig Start Date End Date Taking? Authorizing Provider  albuterol (VENTOLIN HFA) 108 (90 Base)  MCG/ACT inhaler Inhale 2 puffs into the lungs every 6 (six) hours as needed for wheezing or shortness of breath.   Yes [provider]  cholecalciferol (VITAMIN D3) 25 MCG (1000 UNIT) tablet Take 1,000 Units by mouth daily. 03/08/13  Yes [provider]  furosemide (LASIX) 20 MG tablet TAKE ONE TABLET BY MOUTH EVERY DAY FOR FLUID CONTROL, START 10/25 01/01/21  Yes [provider]  losartan (COZAAR) 50 MG tablet Take 25 mg by mouth daily. Pt taking half tabs at each dose for conservation   Yes [provider]  meloxicam (MOBIC) 15 MG tablet Take 15 mg by mouth daily.   Yes [provider]  Multiple Vitamins-Minerals (MULTIVITAMIN ADULTS PO) Take 1 tablet by mouth daily. 04/23/08  Yes [provider]  Omega-3 Fatty Acids (FISH OIL) 1000 MG CAPS TAKE 3 CAPSULES (SEA-OMEGA 30 FISH OIL) BY MOUTH TWO TIMES A DAY FOR CHOLESTEROL 01/08/20  Yes [provider]  omeprazole (PRILOSEC) 20 MG capsule Take 20 mg by mouth daily.   Yes [provider]  pantoprazole (PROTONIX) 40 MG tablet 1 tablet daily. 04/18/20  Yes [provider]  rosuvastatin (CRESTOR) 40 MG tablet Take 40 mg by mouth daily.   Yes [provider]  docusate sodium (COLACE) 100 MG capsule Take 100 mg by mouth 2 (two) times daily. Patient not taking: No sig reported    [provider]  Fluticasone-Salmeterol (ADVAIR) 250-50 MCG/DOSE AEPB Inhale 1 puff into the lungs 2 (two) times daily. Patient not taking: No sig reported    [provider]  ketotifen (ZADITOR) 0.025 % ophthalmic solution 1 drop 2 (two) times daily.    [provider]  pseudoephedrine-acetaminophen (TYLENOL SINUS) 30-500 MG TABS tablet Take 1 tablet by mouth every 4 (four) hours as needed.    [provider]   Scheduled Meds:  vitamin C  500 mg Oral Daily   aspirin EC  81 mg Oral Daily   Chlorhexidine Gluconate Cloth  6 each Topical Daily   Chlorhexidine  Gluconate Cloth  6 each Topical Q0600   cholecalciferol  1,000 Units Oral Daily   feeding  supplement  237 mL Oral BID BM   ferrous sulfate  325 mg Oral Q breakfast   heparin injection (subcutaneous)  5,000 Units Subcutaneous Q8H   ipratropium  2 puff Inhalation TID   levalbuterol  2 puff Inhalation TID   methylPREDNISolone (SOLU-MEDROL) injection  1 mg/kg Intravenous Q12H   Followed by   Derrill Memo ON 01/14/2021] predniSONE  50 mg Oral Daily   metoprolol tartrate  25 mg Oral BID   multivitamin with minerals  1 tablet Oral Daily   omega-3 acid ethyl esters  1 g Oral Daily   pantoprazole (PROTONIX) IV  40 mg Intravenous Q24H   polyethylene glycol  17 g Oral BID   rosuvastatin  10 mg Oral QHS   zinc sulfate  220 mg Oral Daily   Continuous Infusions:  sodium chloride Stopped (01/05/21 1432)   sodium chloride     sodium chloride     piperacillin-tazobactam (ZOSYN)  IV 100 mL/hr at 01/12/21 0454   remdesivir 100 mg in NS 100 mL     PRN Meds:.sodium chloride, sodium chloride, sodium chloride, acetaminophen, albuterol, alteplase, dextromethorphan-guaiFENesin, heparin, hydrALAZINE, lidocaine (PF), lidocaine-prilocaine, LORazepam **OR** LORazepam, morphine injection, ondansetron (ZOFRAN) IV, pentafluoroprop-tetrafluoroeth  Resolved Hospital Problem list     Assessment & Plan:   Acute Hypoxic Respiratory Failure in the setting of Pulmonary Edema, COPD Exacerbation, Community Acquired Pneumonia AND now with COVID-19 Infection -Supplemental O2 as needed to maintain O2 sats >88% -BiPAP, wean as tolerated -Pt now DNR/DNI -Continue remdesivir x5 days  -Continue IV steroids. -Bronchodilators -Encourage OOB, IS, FV, and awake proning if able -Maintain Euvovolemia to net negative fluid balance as able -Continue airborne, contact precautions for 21 days from positive testing. -Monitor CMP and inflammatory markers -Heparin prophylactic dose.  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, moderate to severe aortic valve regurgitation -BNP ~ 3236 -Volume removal with Hemodialysis ~ plan for HD again today 11/14 -Home Cozaar on hold due to AKI  Severe Sepsis and Enterococcal BACTEREMIA due to UTI and Community Acquired Pneumonia COVID-19 Infection -Monitor fever curve -Trend WBC's & Procalcitonin -Follow cultures as above -ID is following, appreciate input ~ will defer ABX to ID -TEE on 11/8 negative for thrombus or vegetation -MRI of Cervical and Lumbar Spine without evidence of Osteomyelitis discitis -Treated empirically with Ampicillin, Cefepime and Vancomycin changed to Zosyn per ID -Remdesivir, plan for 5 days -Zinc & Vitamin C  Acute Kidney Injury Creatine worsening 5.27 from 3.48 -Monitor I&O's / urinary output -Follow BMP -Ensure adequate renal perfusion -Avoid nephrotoxic agents as able -Replace electrolytes as indicated -Intermitted HD initiated. S/p HD 11/12 & 11/13, plan for HD today -Nephrology following, input appreciated  Anemia of Chronic Disease -Monitor for S/Sx of bleeding -Trend CBC -Lovenox for VTE Prophylaxis  -Transfuse for Hgb <7  Best Practice (right click and "Reselect all SmartList Selections" daily)  Diet/type: Dysphagia 3 DVT prophylaxis: Heparin SQ GI prophylaxis: PPI Lines: Right Femoral Dialysis catheter, it is still needed Foley:  Yes, and it is still needed Code Status:   DNR/DNI Last date of multidisciplinary goals  of care discussion [01/12/2021]   Critical care time: 35 minutes    Darel Hong, AGACNP-BC [facility] Prefer epic messenger for cross cover needs If after hours, please call E-link

## 2021-01-12 NOTE — Progress Notes (Signed)
PROGRESS NOTE    Phillip Chavez  PTW:[id number] DOB: [birth year] DOA: 01/03/2021 PCP: [facility] [place]  [id number]   Assessment & Plan:   Principal Problem:   Acute respiratory failure with hypoxia (Phillip Chavez) Active Problems:   CAP (community acquired pneumonia)   Sepsis (Phillip Chavez)   COPD (chronic obstructive pulmonary disease) (HCC)   GERD (gastroesophageal reflux disease)   HTN (hypertension)   HLD (hyperlipidemia)   CHF exacerbation (HCC)   Elevated troponin   AKI (acute kidney injury) (Phillip Chavez)   Normocytic anemia   Aortic valve regurgitation   Phillip Chavez is a 80 y.o. male with medical history significant of hypertension, hyperlipidemia, COPD not on oxygen at home, GERD, recently diagnosed CHF in [facility]avez, who presents with shortness of breath.   Patient states that he has been having upper respiratory symptoms of for almost a month. He was initially seen at the [facility] and diagnosed with viral upper respiratory tract infection.  This lasted for about 10 days and he seemed to get somewhat better but then symptoms returned in past week.  He has shortness of breath, productive cough with greenish colored sputum production. Patient has fever and chills.  Patient also has worsening bilateral lower extremity edema and orthopnea.  Per patient he was recently diagnosed with CHF but no echo done yet.  He was also hypoxic at 89% on arrival to ED which improved to high 90s on 2 L of oxygen.  He met sepsis criteria with temperature of 100.4, leukocytosis and chest x-ray concerning for right middle lobe infiltrate and streaky bilateral basilar opacities.  AKI with creatinine of 1.64, no recent baseline, prior creatinine in the system was 1.17 in December 2014. Preliminary blood cultures with Enterococcus faecalis. Initially started on Levaquin which were switched to Unasyn . Echo with normal EF, no regional wall motion abnormalities, normal diastolic function and mild aortic stenosis and no  vegetations noted.   ID was consulted for Enterococcus bacteremia, they added ceftriaxone empirically for endocarditis until patient had TEE to rule it out completely.  Cardiology consulted for TEE.   Patient was also having neck and back pain, MRI of cervical, thoracic and lumbar spine ordered to rule out any discitis or osteomyelitis, unable to complete MRI of lumbar spine, pain is mostly in neck and upper back, negative for any osteomyelitis or discitis but did show some foraminal narrowing at cervical level which can be taken care as an outpatient.  TEE neg for vegetation.   Acute hypoxemic respiratory failure 2/2 COVID PNA Pulm edema --acute worsening dyspnea and hypoxia morning of 11/11, needed BiPAP with 100% O2.  Transferred to stepdown.  CXR showed increased bilateral lung opacities are noted concerning for worsening pneumonia or edema.  Wife then was COVID pos, and pt tested pos for COVID on 11/12.  --O2 requirement down to 7L today. Plan: --cont IV Remdesivir --cont iHD for volume removal --Continue supplemental O2 to keep sats between 88-92%, wean as tolerated  Mod to severe aortic regurg --may have a component of pulm edema as well.  Unable to diurese 2/2 AKI. --cont iHD for volume removal  Sepsis secondary to CAP and Enterococcus faecalis bacteremia, POA   Met sepsis criteria with fever, leukocytosis and concern of pneumonia, later blood cultures came back positive for Enterococcus faecalis.    Enterococcus bacteremia Patient received 1 dose of vancomycin and Rocephin in ED and then started on Levaquin. Levaquin was switched with Unasyn  based on preliminary blood cultures.  D added  ceftriaxone empirically for endocarditis Unasyn was switched with ampicillin by ID. Repeat blood cultures negative. --TEE neg for vegetation, ceftriaxone d/c'ed. --abx switched to zosyn on 11/11 to cover both aspiration and enterococcus. Plan: --cont Zosyn  COPD exacerbation --severe  dyspnea, increased cough.  started tx for COPD exacerbation on 11/9.  Likely triggered by COVID infection. Plan: --cont IV solumedrol with taper --cont Xopenex and Atrovent scheduled  AKI, worsening, becoming oligouric --Cr went up to 2.23 from 1.53 presumed due to diuresis and dehydration, however, Cr went up even more after gentle MIVF.  Suspect from volume overload.  Pt did not respond to diuresis.  Pt started on temporary dialysis on  --due to poor response to diuresis, nephrology plan to start temporary dialysis on 11/12. --iHD per nephrology  Presumed CAP --received 5 days of ceftriaxone  Elevated troponin.   Peaked at 147 and then trending down.  Likely secondary to demand ischemia with CHF exacerbation.  No chest pain.   Hypertension.   BP trending up --hold home Cozaar and lasix 2/2 AKI --cont Lopressor (Phillip) if can take oral -IV hydralazine as needed   Normocytic anemia:  Hemoglobin 10.6>>9.7.  Anemia panel with anemia of chronic disease and some iron deficiency.  FOBT negative. --cont iron supplement (Phillip) --monitor Hgb --rec outpatient colonoscopy   GERD (gastroesophageal reflux disease) -Protonix   HLD (hyperlipidemia) -Crestor   Generalized weakness. -PT/OT evaluation   DVT prophylaxis: Lovenox SQ Code Status: DNR Family Communication:  Level of care: Stepdown Dispo:   The patient is from: home Anticipated d/c is to: undetermined Anticipated d/c date is: undetermined Patient currently is not medically ready to d/c due to: on 7L O2   Subjective and Interval History:  Pt complained of nausea.  Still reported dyspnea, but O2 requirement improved down to 7L Phillip Chavez.   Objective: Vitals:   01/12/21 1545 01/12/21 1600 01/12/21 1615 01/12/21 1627  BP: (!) 143/37 (!) 138/45 (!) 128/38   Pulse: 74 77 73   Resp: (!) 22 (!) 22 (!) 22   Temp:      TempSrc:      SpO2: 98% 95% 99% 96%  Weight:      Height:        Intake/Output Summary (Last 24 hours) at  01/12/2021 1629 Last data filed at 01/12/2021 0454 Gross per 24 hour  Intake 177.28 ml  Output 75 ml  Net 102.28 ml   Filed Weights   01/11/21 0500 01/11/21 1149 01/12/21 0453  Weight: 84.2 kg 84.2 kg 82.1 kg    Examination:   Constitutional: NAD, AAOx3 HEENT: conjunctivae and lids normal, EOMI CV: No cyanosis.   RESP: increased RR, shallow breaths, on 7L Extremities: No effusions, edema in BLE SKIN: warm, dry Neuro: II - XII grossly intact.   Psych: depressed mood and affect.     Data Reviewed: I have personally reviewed following labs and imaging studies  CBC: Recent Labs  Lab 01/09/21 0543 01/10/21 0500 01/10/21 1400 01/11/21 0422 01/12/21 0436  WBC 10.1 17.3* 14.7* 10.9* 10.6*  HGB 9.6* 10.7* 10.6* 10.0* 9.9*  HCT 29.5* 34.5* 31.8* 30.0* 30.1*  MCV 86.3 90.6 87.8 85.5 86.0  PLT 244 278 313 256 831   Basic Metabolic Panel: Recent Labs  Lab 01/08/21 0450 01/09/21 0543 01/10/21 0500 01/10/21 1444 01/11/21 0422 01/11/21 0915 01/12/21 0436  NA 132* 135 136 137  --  136 136  K 4.6 4.7 5.9* 5.2*  --  4.8 4.2  CL 103 103 103 101  --  102 100  CO2 21* 22 19* 19*  --  20* 24  GLUCOSE 149* 140* 133* 157*  --  153* 162*  BUN 26* 46* 67* 78*  --  71* 63*  CREATININE 2.23* 3.48* 5.27* 6.14*  --  5.47* 5.05*  CALCIUM 7.8* 8.3* 8.4* 8.5*  --  8.0* 8.3*  MG 2.1 2.4 2.7*  --  2.4  --  2.4  PHOS  --   --  9.4* 9.1* 7.2* 8.2* 7.2*   GFR: Estimated Creatinine Clearance: 11.1 mL/min (A) (by C-G formula based on SCr of 5.05 mg/dL (H)). Liver Function Tests: Recent Labs  Lab 01/07/21 0443 01/10/21 1444 01/11/21 0915 01/12/21 0436  AST  --   --   --  44*  ALT  --   --   --  69*  ALKPHOS  --   --   --  83  BILITOT  --   --   --  1.0  PROT  --   --   --  6.7  ALBUMIN 2.6* 2.7* 2.4* 2.6*   No results for input(s): LIPASE, AMYLASE in the last 168 hours. No results for input(s): AMMONIA in the last 168 hours. Coagulation Profile: No results for input(s): INR,  PROTIME in the last 168 hours. Cardiac Enzymes: No results for input(s): CKTOTAL, CKMB, CKMBINDEX, TROPONINI in the last 168 hours. BNP (last 3 results) No results for input(s): PROBNP in the last 8760 hours. HbA1C: No results for input(s): HGBA1C in the last 72 hours.  CBG: Recent Labs  Lab 01/11/21 1617 01/11/21 2117 01/12/21 0111 01/12/21 0800 01/12/21 1137  GLUCAP 126* 161* 140* 161* 200*   Lipid Profile: No results for input(s): CHOL, HDL, LDLCALC, TRIG, CHOLHDL, LDLDIRECT in the last 72 hours. Thyroid Function Tests: No results for input(s): TSH, T4TOTAL, FREET4, T3FREE, THYROIDAB in the last 72 hours.  Anemia Panel: Recent Labs    01/12/21 0436  FERRITIN 628*    Sepsis Labs: Recent Labs  Lab 01/09/21 0637 01/10/21 0500 01/11/21 0422  PROCALCITON 0.50 1.61 1.70    Recent Results (from the past 240 hour(s))  Resp Panel by RT-PCR (Flu A&B, Covid) Nasopharyngeal Swab     Status: None   Collection Time: 01/03/21  6:06 AM   Specimen: Nasopharyngeal Swab; Nasopharyngeal(NP) swabs in vial transport medium  Result Value Ref Range Status   SARS Coronavirus 2 by RT PCR NEGATIVE NEGATIVE Final    Comment: (NOTE) SARS-CoV-2 target nucleic acids are NOT DETECTED.  The SARS-CoV-2 RNA is generally detectable in upper respiratory specimens during the acute phase of infection. The lowest concentration of SARS-CoV-2 viral copies this assay can detect is 138 copies/mL. A negative result does not preclude SARS-Cov-2 infection and should not be used as the sole basis for treatment or other patient management decisions. A negative result may occur with  improper specimen collection/handling, submission of specimen other than nasopharyngeal swab, presence of viral mutation(s) within the areas targeted by this assay, and inadequate number of viral copies(<138 copies/mL). A negative result must be combined with clinical observations, patient history, and  epidemiological information. The expected result is Negative.  Fact Sheet for Patients:  EntrepreneurPulse.com.au  Fact Sheet for Healthcare Providers:  IncredibleEmployment.be  This test is no t yet approved or cleared by the [place] FDA and  has been authorized for detection and/or diagnosis of SARS-CoV-2 by FDA under an Emergency Use Authorization (EUA). This EUA will remain  in effect (meaning this test can be used) for the  duration of the COVID-19 declaration under Section 564(b)(1) of the Act, 21 U.S.C.section 360bbb-3(b)(1), unless the authorization is terminated  or revoked sooner.       Influenza A by PCR NEGATIVE NEGATIVE Final   Influenza B by PCR NEGATIVE NEGATIVE Final    Comment: (NOTE) The Xpert Xpress SARS-CoV-2/FLU/RSV plus assay is intended as an aid in the diagnosis of influenza from Nasopharyngeal swab specimens and should not be used as a sole basis for treatment. Nasal washings and aspirates are unacceptable for Xpert Xpress SARS-CoV-2/FLU/RSV testing.  Fact Sheet for Patients: EntrepreneurPulse.com.au  Fact Sheet for Healthcare Providers: IncredibleEmployment.be  This test is not yet approved or cleared by the [place] FDA and has been authorized for detection and/or diagnosis of SARS-CoV-2 by FDA under an Emergency Use Authorization (EUA). This EUA will remain in effect (meaning this test can be used) for the duration of the COVID-19 declaration under Section 564(b)(1) of the Act, 21 U.S.C. section 360bbb-3(b)(1), unless the authorization is terminated or revoked.  Performed at [facility], [address] [place], [place] [address]   Blood culture (routine x 2)     Status: Abnormal   Collection Time: 01/03/21  9:17 AM   Specimen: BLOOD  Result Value Ref Range Status   Specimen Description   Final    BLOOD LEFT ANTECUBITAL Performed at [facility], [address] [place], [place] [address]    Special Requests   Final    BOTTLES DRAWN AEROBIC AND ANAEROBIC Blood Culture adequate volume Performed at [facility] [address] [place], [place] [address]    Culture  Setup Time   Final    GRAM POSITIVE COCCI IN BOTH AEROBIC AND ANAEROBIC BOTTLES CRITICAL RESULT CALLED TO, READ BACK BY AND VERIFIED WITH: NATHAN BELUE AT 7253 01/04/21.PMF Performed at [facility], [address], [place], [place] [address]    Culture (A)  Final    ENTEROCOCCUS FAECALIS SUSCEPTIBILITIES PERFORMED ON PREVIOUS CULTURE WITHIN THE LAST 5 DAYS. Performed at [facility], [place] [address], [place], [place] [address]    Report Status 01/06/2021 FINAL  Final  Blood culture (routine x 2)     Status: Abnormal   Collection Time: 01/03/21  9:17 AM   Specimen: BLOOD LEFT HAND  Result Value Ref Range Status   Specimen Description   Final    BLOOD LEFT HAND Performed at [facility], [address] [place], [place] [address]    Special Requests   Final    BOTTLES DRAWN AEROBIC AND ANAEROBIC Blood Culture adequate volume Performed at [facility], [address] [place], [place] [address]    Culture  Setup Time   Final    Organism ID to follow GRAM POSITIVE COCCI IN BOTH AEROBIC AND ANAEROBIC BOTTLES CRITICAL RESULT CALLED TO, READ BACK BY AND VERIFIED WITH: NATHAN BELUE AT 3329 01/04/21.PMF Performed at [facility], [address] [place], [place] [address]    Culture ENTEROCOCCUS FAECALIS (A)  Final   Report Status 01/06/2021 FINAL  Final   Organism ID, Bacteria ENTEROCOCCUS FAECALIS  Final      Susceptibility   Enterococcus faecalis - MIC*    AMPICILLIN <=2 SENSITIVE Sensitive     VANCOMYCIN 1 SENSITIVE Sensitive     GENTAMICIN SYNERGY SENSITIVE Sensitive     * ENTEROCOCCUS FAECALIS  Blood Culture ID Panel (Reflexed)     Status: Abnormal   Collection Time: 01/03/21  9:17 AM  Result Value Ref Range Status   Enterococcus faecalis DETECTED (A) NOT DETECTED Final    Comment: CRITICAL RESULT CALLED TO, READ BACK BY AND VERIFIED WITH: NATHAN BELUE AT 3419 01/04/21.PMF    Enterococcus Faecium NOT DETECTED NOT DETECTED Final   Listeria monocytogenes NOT DETECTED NOT DETECTED Final   Staphylococcus species NOT DETECTED NOT DETECTED Final   Staphylococcus aureus (BCID) NOT DETECTED NOT DETECTED Final   Staphylococcus epidermidis NOT DETECTED NOT DETECTED Final   Staphylococcus lugdunensis NOT DETECTED NOT DETECTED Final   Streptococcus species NOT DETECTED NOT DETECTED Final   Streptococcus agalactiae NOT DETECTED NOT DETECTED Final   Streptococcus pneumoniae NOT DETECTED NOT DETECTED Final   Streptococcus pyogenes NOT DETECTED NOT DETECTED Final   A.calcoaceticus-baumannii NOT DETECTED NOT DETECTED Final   Bacteroides fragilis NOT DETECTED NOT DETECTED Final   Enterobacterales NOT DETECTED NOT DETECTED Final   Enterobacter cloacae complex NOT DETECTED NOT DETECTED Final   Escherichia coli NOT DETECTED NOT DETECTED Final   Klebsiella aerogenes NOT DETECTED NOT DETECTED Final   Klebsiella oxytoca NOT DETECTED NOT DETECTED Final   Klebsiella pneumoniae NOT DETECTED NOT DETECTED Final   Proteus species NOT DETECTED NOT DETECTED Final   Salmonella species NOT DETECTED NOT DETECTED Final   Serratia marcescens NOT DETECTED NOT DETECTED Final   Haemophilus influenzae NOT DETECTED NOT DETECTED Final   Neisseria meningitidis NOT DETECTED NOT DETECTED Final   Pseudomonas aeruginosa NOT DETECTED NOT DETECTED Final   Stenotrophomonas maltophilia NOT DETECTED NOT DETECTED Final   Candida albicans NOT DETECTED NOT DETECTED Final   Candida auris NOT DETECTED NOT DETECTED Final   Candida glabrata NOT DETECTED NOT DETECTED Final   Candida krusei NOT DETECTED NOT DETECTED Final   Candida parapsilosis NOT DETECTED NOT DETECTED Final   Candida tropicalis NOT DETECTED NOT DETECTED  Final   Cryptococcus neoformans/gattii NOT DETECTED NOT DETECTED Final   Vancomycin resistance NOT DETECTED NOT DETECTED Final    Comment: Performed at [facility], [address], [place], [place] [address]  Urine Culture     Status: None   Collection Time: 01/04/21  5:21 PM   Specimen: Urine, Clean Catch  Result Value Ref Range Status   Specimen Description   Final    URINE, CLEAN CATCH Performed at [facility], [address], [place], [place] [address]    Special Requests   Final    NONE Performed at [facility], [address] [place], [place] [address]    Culture   Final    NO GROWTH Performed at [facility], [place] [address] [place], [place] [address]    Report Status 01/06/2021 FINAL  Final  CULTURE, BLOOD (ROUTINE X 2) w Reflex to ID Panel     Status: None   Collection Time: 01/06/21  5:28 AM   Specimen: BLOOD  Result Value Ref Range Status   Specimen Description BLOOD LEFT FA  Final   Special Requests   Final    BOTTLES DRAWN AEROBIC AND ANAEROBIC Blood Culture adequate volume   Culture   Final    NO GROWTH 5 DAYS Performed at [facility], [address] [place], [place] [address]    Report Status 01/11/2021 FINAL  Final  CULTURE, BLOOD (ROUTINE X 2) w Reflex to ID Panel     Status: None   Collection Time: 01/06/21  5:29 AM   Specimen: BLOOD  Result Value Ref Range Status   Specimen Description BLOOD LEFT HAND  Final   Special Requests   Final    BOTTLES DRAWN AEROBIC AND ANAEROBIC Blood Culture adequate volume   Culture   Final    NO GROWTH 5 DAYS Performed at [facility], [address] [place], [place] [address]    Report Status 01/11/2021 FINAL  Final  MRSA Next Gen by PCR, Nasal     Status: None   Collection Time: 01/09/21  9:50 AM   Specimen: Nasal Mucosa; Nasal Swab  Result Value Ref Range Status   MRSA by PCR Next Gen NOT DETECTED NOT DETECTED Final    Comment: (NOTE) The GeneXpert  MRSA Assay (FDA approved for NASAL specimens only), is one component of a comprehensive MRSA colonization surveillance program. It is not intended to diagnose MRSA infection nor to guide or monitor treatment for MRSA infections. Test performance is not FDA approved in patients less than 19 years old. Performed at [facility] [address] [place], [place] [address]   Resp Panel by RT-PCR (Flu A&B, Covid) Nasopharyngeal Swab     Status: Abnormal   Collection Time: 01/10/21 10:46 PM   Specimen: Nasopharyngeal Swab; Nasopharyngeal(NP) swabs in vial transport medium  Result Value Ref Range Status   SARS Coronavirus 2 by RT PCR POSITIVE (A) NEGATIVE Final    Comment: RESULT CALLED TO, READ BACK BY AND VERIFIED WITH: ERICKA MCMILLIAN @ 0025 01/11/21 LFD (NOTE) SARS-CoV-2 target nucleic acids are DETECTED.  The SARS-CoV-2 RNA is generally detectable in upper respiratory specimens during the acute phase of infection. Positive results are indicative of the presence of the identified virus, but do not rule out bacterial infection or co-infection with other pathogens not detected by the test. Clinical correlation with patient history and other diagnostic information is necessary to determine patient infection status. The expected result is Negative.  Fact Sheet for Patients: EntrepreneurPulse.com.au  Fact Sheet for Healthcare Providers: IncredibleEmployment.be  This test is not yet approved or cleared by the [place] FDA and  has been authorized for detection and/or diagnosis of SARS-CoV-2 by FDA under an Emergency Use Authorization (EUA).  This EUA will remain in effect (meaning this test can  be used) for the duration of  the COVID-19 declaration under Section 564(b)(1) of the Act, 21 U.S.C. section 360bbb-3(b)(1), unless the authorization is terminated or revoked sooner.     Influenza A by PCR NEGATIVE NEGATIVE Final   Influenza  B by PCR NEGATIVE NEGATIVE Final    Comment: (NOTE) The Xpert Xpress SARS-CoV-2/FLU/RSV plus assay is intended as an aid in the diagnosis of influenza from Nasopharyngeal swab specimens and should not be used as a sole basis for treatment. Nasal washings and aspirates are unacceptable for Xpert Xpress SARS-CoV-2/FLU/RSV testing.  Fact Sheet for Patients: EntrepreneurPulse.com.au  Fact Sheet for Healthcare Providers: IncredibleEmployment.be  This test is not yet approved or cleared by the [place] FDA and has been authorized for detection and/or diagnosis of SARS-CoV-2 by FDA under an Emergency Use Authorization (EUA). This EUA will remain in effect (meaning this test can be used) for the duration of the COVID-19 declaration under Section 564(b)(1) of the Act, 21 U.S.C. section 360bbb-3(b)(1), unless the authorization is terminated or revoked.  Performed at [facility], [address], [place], [place] [address]       Radiology Studies: No results found.   Scheduled Meds:  vitamin C  500 mg Oral Daily   aspirin EC  81 mg Oral Daily   Chlorhexidine Gluconate Cloth  6  each Topical Daily   Chlorhexidine Gluconate Cloth  6 each Topical Q0600   cholecalciferol  1,000 Units Oral Daily   feeding supplement (NEPRO CARB STEADY)  237 mL Oral TID BM   ferrous sulfate  325 mg Oral Q breakfast   heparin injection (subcutaneous)  5,000 Units Subcutaneous Q8H   ipratropium  2 puff Inhalation TID   levalbuterol  2 puff Inhalation TID   methylPREDNISolone (SOLU-MEDROL) injection  1 mg/kg Intravenous Q12H   Followed by   Derrill Memo ON 01/14/2021] predniSONE  50 mg Oral Daily   metoprolol tartrate  25 mg Oral BID   multivitamin  1 tablet Oral QHS   multivitamin with minerals  1 tablet Oral Daily   omega-3 acid ethyl esters  1 g Oral Daily   pantoprazole  40 mg Oral QHS   polyethylene glycol  17 g Oral BID   rosuvastatin  10 mg Oral QHS    zinc sulfate  220 mg Oral Daily   Continuous Infusions:  sodium chloride Stopped (01/05/21 1432)   sodium chloride     sodium chloride     piperacillin-tazobactam (ZOSYN)  IV 2.25 g (01/12/21 1339)   remdesivir 100 mg in NS 100 mL 100 mg (01/12/21 1100)     LOS: 9 days    Enzo Bi, MD [facility] If 7PM-7AM, please contact night-coverage 01/12/2021, 4:29 PM

## 2021-01-12 NOTE — Progress Notes (Signed)
Pt weaned from 78 to 6L.   Pt given PRN morphine x1.   Pt underwent HD, 2L off.

## 2021-01-12 NOTE — Progress Notes (Signed)
PHARMACIST - PHYSICIAN COMMUNICATION  CONCERNING: IV to Oral Route Change Policy  RECOMMENDATION: This patient is receiving pantoprazole by the intravenous route.  Based on criteria approved by the Pharmacy and Therapeutics Committee, the intravenous medication(s) is/are being converted to the equivalent oral dose form(s).   DESCRIPTION: These criteria include: The patient is eating (either orally or via tube) and/or has been taking other orally administered medications for a least 24 hours The patient has no evidence of active gastrointestinal bleeding or impaired GI absorption (gastrectomy, short bowel, patient on TNA or NPO).  If you have questions about this conversion, please contact the [place] 01/12/2021 10:45 AM

## 2021-01-13 ENCOUNTER — Ambulatory Visit: Payer: No Typology Code available for payment source | Admitting: Family

## 2021-01-13 DIAGNOSIS — Z7189 Other specified counseling: Secondary | ICD-10-CM | POA: Diagnosis not present

## 2021-01-13 DIAGNOSIS — Z515 Encounter for palliative care: Secondary | ICD-10-CM

## 2021-01-13 DIAGNOSIS — J9601 Acute respiratory failure with hypoxia: Secondary | ICD-10-CM | POA: Diagnosis not present

## 2021-01-13 DIAGNOSIS — J189 Pneumonia, unspecified organism: Secondary | ICD-10-CM

## 2021-01-13 DIAGNOSIS — Z66 Do not resuscitate: Secondary | ICD-10-CM

## 2021-01-13 DIAGNOSIS — N179 Acute kidney failure, unspecified: Secondary | ICD-10-CM | POA: Diagnosis not present

## 2021-01-13 LAB — CBC
HCT: 28.7 % — ABNORMAL LOW (ref 39.0–52.0)
Hemoglobin: 9.6 g/dL — ABNORMAL LOW (ref 13.0–17.0)
MCH: 28.8 pg (ref 26.0–34.0)
MCHC: 33.4 g/dL (ref 30.0–36.0)
MCV: 86.2 fL (ref 80.0–100.0)
Platelets: 237 10*3/uL (ref 150–400)
RBC: 3.33 MIL/uL — ABNORMAL LOW (ref 4.22–5.81)
RDW: 14.9 % (ref 11.5–15.5)
WBC: 8.5 10*3/uL (ref 4.0–10.5)
nRBC: 0 % (ref 0.0–0.2)

## 2021-01-13 LAB — GLUCOSE, CAPILLARY
Glucose-Capillary: 152 mg/dL — ABNORMAL HIGH (ref 70–99)
Glucose-Capillary: 158 mg/dL — ABNORMAL HIGH (ref 70–99)
Glucose-Capillary: 166 mg/dL — ABNORMAL HIGH (ref 70–99)
Glucose-Capillary: 178 mg/dL — ABNORMAL HIGH (ref 70–99)
Glucose-Capillary: 216 mg/dL — ABNORMAL HIGH (ref 70–99)
Glucose-Capillary: 225 mg/dL — ABNORMAL HIGH (ref 70–99)

## 2021-01-13 LAB — COMPREHENSIVE METABOLIC PANEL
ALT: 55 U/L — ABNORMAL HIGH (ref 0–44)
AST: 31 U/L (ref 15–41)
Albumin: 2.4 g/dL — ABNORMAL LOW (ref 3.5–5.0)
Alkaline Phosphatase: 83 U/L (ref 38–126)
Anion gap: 13 (ref 5–15)
BUN: 59 mg/dL — ABNORMAL HIGH (ref 8–23)
CO2: 25 mmol/L (ref 22–32)
Calcium: 8 mg/dL — ABNORMAL LOW (ref 8.9–10.3)
Chloride: 99 mmol/L (ref 98–111)
Creatinine, Ser: 5.06 mg/dL — ABNORMAL HIGH (ref 0.61–1.24)
GFR, Estimated: 11 mL/min — ABNORMAL LOW (ref >60)
Glucose, Bld: 155 mg/dL — ABNORMAL HIGH (ref 70–99)
Potassium: 4.2 mmol/L (ref 3.5–5.1)
Sodium: 137 mmol/L (ref 135–145)
Total Bilirubin: 0.7 mg/dL (ref 0.3–1.2)
Total Protein: 6 g/dL — ABNORMAL LOW (ref 6.5–8.1)

## 2021-01-13 LAB — C-REACTIVE PROTEIN: CRP: 1.9 mg/dL — ABNORMAL HIGH (ref <1.0)

## 2021-01-13 LAB — MAGNESIUM: Magnesium: 2.5 mg/dL — ABNORMAL HIGH (ref 1.7–2.4)

## 2021-01-13 LAB — D-DIMER, QUANTITATIVE: D-Dimer, Quant: 3.39 ug/mL-FEU — ABNORMAL HIGH (ref 0.00–0.50)

## 2021-01-13 LAB — FERRITIN: Ferritin: 560 ng/mL — ABNORMAL HIGH (ref 24–336)

## 2021-01-13 MED ORDER — MORPHINE SULFATE (PF) 2 MG/ML IV SOLN
2.0000 mg | Freq: Four times a day (QID) | INTRAVENOUS | Status: DC | PRN
  Administered 2021-01-20: 2 mg via INTRAVENOUS
  Filled 2021-01-13: qty 1

## 2021-01-13 NOTE — Progress Notes (Signed)
Report called to Leveda Anna, Therapist, sports. He is tranferring to rm [id number]. All questions answered. Calling to contact wife of transfer to different room.

## 2021-01-13 NOTE — Progress Notes (Addendum)
PROGRESS NOTE    Phillip Chavez  PHK:[id number] DOB: [birth year] DOA: 01/03/2021 PCP: [facility]  [id number]   Assessment & Plan:   Principal Problem:   Acute respiratory failure with hypoxia ([place]) Active Problems:   CAP (community acquired pneumonia)   Sepsis ([place])   COPD (chronic obstructive pulmonary disease) (HCC)   GERD (gastroesophageal reflux disease)   HTN (hypertension)   HLD (hyperlipidemia)   CHF exacerbation (HCC)   Elevated troponin   AKI (acute kidney injury) ([place])   Normocytic anemia   Aortic valve regurgitation   Phillip Chavez is a 80 y.o. male with medical history significant of hypertension, hyperlipidemia, COPD not on oxygen at home, GERD, recently diagnosed CHF in [place], who presents with shortness of breath.   Patient states that he has been having upper respiratory symptoms of for almost a month. He was initially seen at the [place] and diagnosed with viral upper respiratory tract infection.  This lasted for about 10 days and he seemed to get somewhat better but then symptoms returned in past week.  He has shortness of breath, productive cough with greenish colored sputum production. Patient has fever and chills.  Patient also has worsening bilateral lower extremity edema and orthopnea.  Per patient he was recently diagnosed with CHF but no echo done yet.  He was also hypoxic at 89% on arrival to ED which improved to high 90s on 2 L of oxygen.  He met sepsis criteria with temperature of 100.4, leukocytosis and chest x-ray concerning for right middle lobe infiltrate and streaky bilateral basilar opacities.  AKI with creatinine of 1.64, no recent baseline, prior creatinine in the system was 1.17 in December 2014. Preliminary blood cultures with Enterococcus faecalis. Initially started on Levaquin which were switched to Unasyn . Echo with normal EF, no regional wall motion abnormalities, normal diastolic function and mild aortic stenosis and no  vegetations noted.   ID was consulted for Enterococcus bacteremia, they added ceftriaxone empirically for endocarditis until patient had TEE to rule it out completely.  Cardiology consulted for TEE.   Patient was also having neck and back pain, MRI of cervical, thoracic and lumbar spine ordered to rule out any discitis or osteomyelitis, unable to complete MRI of lumbar spine, pain is mostly in neck and upper back, negative for any osteomyelitis or discitis but did show some foraminal narrowing at cervical level which can be taken care as an outpatient.  TEE neg for vegetation.   Acute on chronic hypoxemic respiratory failure  --many acute pulm issues on top of baseline pulm problems.  COVID PNA, pulm edema from fluid overload, Mod to severe aortic regurg, COPD, hx of Asbestos pleural plaque. --acute worsening dyspnea and hypoxia morning of 11/11, needed BiPAP with 100% O2.  Transferred to stepdown.  With treatment of all the underlying problems, O2 requirement has been trending down, to 5L today Plan: --treat respiratory issues as below --Continue supplemental O2 to keep sats between 88-92%, wean as tolerated  COVID PNA Pulm edema --acute worsening dyspnea and hypoxia morning of 11/11, needed BiPAP with 100% O2.  Transferred to stepdown.  CXR showed increased bilateral lung opacities are noted concerning for worsening pneumonia or edema.  Wife then was COVID pos, and pt tested pos for COVID on 11/12.  --CRP has been low while receiving steroid Plan: --cont IV Remdesivir --cont iHD for volume removal  Mod to severe aortic regurg --Unable to diurese 2/2 AKI. --cont iHD for volume removal  Sepsis  secondary to CAP and Enterococcus faecalis bacteremia, POA   Met sepsis criteria with fever, leukocytosis and concern of pneumonia, later blood cultures came back positive for Enterococcus faecalis.    Enterococcus bacteremia Patient received 1 dose of vancomycin and Rocephin in ED and then started  on Levaquin. Levaquin was switched with Unasyn  based on preliminary blood cultures.  D added ceftriaxone empirically for endocarditis Unasyn was switched with ampicillin by ID. Repeat blood cultures negative. --TEE neg for vegetation, ceftriaxone d/c'ed. --abx switched to zosyn on 11/11 to cover both aspiration and enterococcus. Plan: --cont zosyn, until 01/17/21, per ID  COPD exacerbation --severe dyspnea, increased cough.  started tx for COPD exacerbation on 11/9.  Likely triggered by COVID infection. Plan: --cont IV solumedrol with taper --cont Xopenex and Atrovent scheduled  AKI, worsening, becoming oligouric On CKD 3B --Cr went up to 2.23 from 1.53 presumed due to diuresis and dehydration, however, Cr went up even more after gentle MIVF.  Suspect from volume overload.  Pt did not respond to diuresis.  Pt started on temporary dialysis on  --due to poor response to diuresis, nephrology plan to start temporary dialysis on 11/12. Plan: --iHD per nephro  Presumed CAP --received 5 days of ceftriaxone  Elevated troponin.   Peaked at 147 and then trending down.  Likely secondary to demand ischemia with CHF exacerbation.  No chest pain.   Hypertension.   BP intermittently low --hold home Cozaar and lasix 2/2 AKI --hold home metop due to intermittently low BP -IV hydralazine as needed   Normocytic anemia:  Hemoglobin 10.6>>9.7.  Anemia panel with anemia of chronic disease and some iron deficiency.  FOBT negative. --cont iron supplement (new) --monitor Hgb --rec outpatient colonoscopy   GERD (gastroesophageal reflux disease) -Protonix   HLD (hyperlipidemia) -Crestor   Generalized weakness. -PT/OT evaluation   DVT prophylaxis: Lovenox SQ Code Status: DNR Family Communication:  Level of care: Med-Surg Dispo:   The patient is from: home Anticipated d/c is to: undetermined Anticipated d/c date is: undetermined Patient currently is not medically ready to d/c due to: on  5L O2   Subjective and Interval History:  Pt still complained of dyspnea, although appearing much more comfortable on 5L [place].  Pt said that he wanted to go home and missed his wife.     Objective: Vitals:   01/13/21 1400 01/13/21 1402 01/13/21 1500 01/13/21 1600  BP: (!) 141/38  (!) 123/36 (!) 129/36  Pulse: 73  66 63  Resp: '15  13 13  ' Temp:      TempSrc:      SpO2: 98% 95% 94% 96%  Weight:      Height:        Intake/Output Summary (Last 24 hours) at 01/13/2021 1658 Last data filed at 01/13/2021 1600 Gross per 24 hour  Intake 119.8 ml  Output 2081 ml  Net -1961.2 ml   Filed Weights   01/11/21 1149 01/12/21 0453 01/13/21 0500  Weight: 84.2 kg 82.1 kg 82.2 kg    Examination:   Constitutional: NAD, AAOx3 HEENT: conjunctivae and lids normal, EOMI CV: No cyanosis.   RESP: normal respiratory effort, on 5L Extremities: No effusions, edema in BLE SKIN: warm, dry Neuro: II - XII grossly intact.   Psych: depressed mood and affect.     Data Reviewed: I have personally reviewed following labs and imaging studies  CBC: Recent Labs  Lab 01/10/21 0500 01/10/21 1400 01/11/21 0422 01/12/21 0436 01/13/21 0843  WBC 17.3* 14.7* 10.9* 10.6* 8.5  HGB 10.7* 10.6* 10.0* 9.9* 9.6*  HCT 34.5* 31.8* 30.0* 30.1* 28.7*  MCV 90.6 87.8 85.5 86.0 86.2  PLT 278 313 256 298 161   Basic Metabolic Panel: Recent Labs  Lab 01/09/21 0543 01/10/21 0500 01/10/21 1444 01/11/21 0422 01/11/21 0915 01/12/21 0436 01/13/21 0843  NA 135 136 137  --  136 136 137  K 4.7 5.9* 5.2*  --  4.8 4.2 4.2  CL 103 103 101  --  102 100 99  CO2 22 19* 19*  --  20* 24 25  GLUCOSE 140* 133* 157*  --  153* 162* 155*  BUN 46* 67* 78*  --  71* 63* 59*  CREATININE 3.48* 5.27* 6.14*  --  5.47* 5.05* 5.06*  CALCIUM 8.3* 8.4* 8.5*  --  8.0* 8.3* 8.0*  MG 2.4 2.7*  --  2.4  --  2.4 2.5*  PHOS  --  9.4* 9.1* 7.2* 8.2* 7.2*  --    GFR: Estimated Creatinine Clearance: 11.1 mL/min (A) (by C-G formula based on  SCr of 5.06 mg/dL (H)). Liver Function Tests: Recent Labs  Lab 01/07/21 0443 01/10/21 1444 01/11/21 0915 01/12/21 0436 01/13/21 0843  AST  --   --   --  44* 31  ALT  --   --   --  69* 55*  ALKPHOS  --   --   --  83 83  BILITOT  --   --   --  1.0 0.7  PROT  --   --   --  6.7 6.0*  ALBUMIN 2.6* 2.7* 2.4* 2.6* 2.4*   No results for input(s): LIPASE, AMYLASE in the last 168 hours. No results for input(s): AMMONIA in the last 168 hours. Coagulation Profile: No results for input(s): INR, PROTIME in the last 168 hours. Cardiac Enzymes: No results for input(s): CKTOTAL, CKMB, CKMBINDEX, TROPONINI in the last 168 hours. BNP (last 3 results) No results for input(s): PROBNP in the last 8760 hours. HbA1C: No results for input(s): HGBA1C in the last 72 hours.  CBG: Recent Labs  Lab 01/12/21 1931 01/13/21 0207 01/13/21 0622 01/13/21 1055 01/13/21 1654  GLUCAP 143* 152* 178* 166* 158*   Lipid Profile: No results for input(s): CHOL, HDL, LDLCALC, TRIG, CHOLHDL, LDLDIRECT in the last 72 hours. Thyroid Function Tests: No results for input(s): TSH, T4TOTAL, FREET4, T3FREE, THYROIDAB in the last 72 hours.  Anemia Panel: Recent Labs    01/12/21 0436 01/13/21 0843  FERRITIN 628* 560*    Sepsis Labs: Recent Labs  Lab 01/09/21 0637 01/10/21 0500 01/11/21 0422  PROCALCITON 0.50 1.61 1.70    Recent Results (from the past 240 hour(s))  Urine Culture     Status: None   Collection Time: 01/04/21  5:21 PM   Specimen: Urine, Clean Catch  Result Value Ref Range Status   Specimen Description   Final    URINE, CLEAN CATCH Performed at [facility], [address] [place], [place] [address]    Special Requests   Final    NONE Performed at [facility], [address] [place], [place] [address]    Culture   Final    NO GROWTH Performed at [facility], [place] [address], [place], [place] [address]    Report Status 01/06/2021 FINAL  Final  CULTURE,  BLOOD (ROUTINE X 2) w Reflex to ID Panel     Status: None   Collection Time: 01/06/21  5:28 AM   Specimen: BLOOD  Result Value Ref  Range Status   Specimen Description BLOOD LEFT FA  Final   Special Requests   Final    BOTTLES DRAWN AEROBIC AND ANAEROBIC Blood Culture adequate volume   Culture   Final    NO GROWTH 5 DAYS Performed at [facility], [address] [place], [place] [address]    Report Status 01/11/2021 FINAL  Final  CULTURE, BLOOD (ROUTINE X 2) w Reflex to ID Panel     Status: None   Collection Time: 01/06/21  5:29 AM   Specimen: BLOOD  Result Value Ref Range Status   Specimen Description BLOOD LEFT HAND  Final   Special Requests   Final    BOTTLES DRAWN AEROBIC AND ANAEROBIC Blood Culture adequate volume   Culture   Final    NO GROWTH 5 DAYS Performed at [facility], [address], [place], [place] [address]    Report Status 01/11/2021 FINAL  Final  MRSA Next Gen by PCR, Nasal     Status: None   Collection Time: 01/09/21  9:50 AM   Specimen: Nasal Mucosa; Nasal Swab  Result Value Ref Range Status   MRSA by PCR Next Gen NOT DETECTED NOT DETECTED Final    Comment: (NOTE) The GeneXpert MRSA Assay (FDA approved for NASAL specimens only), is one component of a comprehensive MRSA colonization surveillance program. It is not intended to diagnose MRSA infection nor to guide or monitor treatment for MRSA infections. Test performance is not FDA approved in patients less than 61 years old. Performed at [facility], [address] [place], [place] [address]   Resp Panel by RT-PCR (Flu A&B, Covid) Nasopharyngeal Swab     Status: Abnormal   Collection Time: 01/10/21 10:46 PM   Specimen: Nasopharyngeal Swab; Nasopharyngeal(NP) swabs in vial transport medium  Result Value Ref Range Status   SARS Coronavirus 2 by RT PCR POSITIVE (A) NEGATIVE Final    Comment: RESULT CALLED TO, READ BACK BY AND VERIFIED WITH: ERICKA MCMILLIAN @ 0025 01/11/21  LFD (NOTE) SARS-CoV-2 target nucleic acids are DETECTED.  The SARS-CoV-2 RNA is generally detectable in upper respiratory specimens during the acute phase of infection. Positive results are indicative of the presence of the identified virus, but do not rule out bacterial infection or co-infection with other pathogens not detected by the test. Clinical correlation with patient history and other diagnostic information is necessary to determine patient infection status. The expected result is Negative.  Fact Sheet for Patients: EntrepreneurPulse.com.au  Fact Sheet for Healthcare Providers: IncredibleEmployment.be  This test is not yet approved or cleared by the [place] FDA and  has been authorized for detection and/or diagnosis of SARS-CoV-2 by FDA under an Emergency Use Authorization (EUA).  This EUA will remain in effect (meaning this test can  be used) for the duration of  the COVID-19 declaration under Section 564(b)(1) of the Act, 21 U.S.C. section 360bbb-3(b)(1), unless the authorization is terminated or revoked sooner.     Influenza A by PCR NEGATIVE NEGATIVE Final   Influenza B by PCR NEGATIVE NEGATIVE Final    Comment: (NOTE) The Xpert Xpress SARS-CoV-2/FLU/RSV plus assay is intended as an aid in the diagnosis of influenza from Nasopharyngeal swab specimens and should not be used as a sole basis for treatment. Nasal washings and aspirates are unacceptable for Xpert Xpress SARS-CoV-2/FLU/RSV testing.  Fact Sheet for Patients: EntrepreneurPulse.com.au  Fact Sheet for Healthcare Providers: IncredibleEmployment.be  This test is not yet approved or cleared by the [place] FDA  and has been authorized for detection and/or diagnosis of SARS-CoV-2 by FDA under an Emergency Use Authorization (EUA). This EUA will remain in effect (meaning this test can be used) for the duration of the COVID-19  declaration under Section 564(b)(1) of the Act, 21 U.S.C. section 360bbb-3(b)(1), unless the authorization is terminated or revoked.  Performed at [facility], [address], [place], [place] [address]       Radiology Studies: No results found.   Scheduled Meds:  vitamin C  500 mg Oral Daily   aspirin EC  81 mg Oral Daily   Chlorhexidine Gluconate Cloth  6 each Topical Daily   Chlorhexidine Gluconate Cloth  6 each Topical Q0600   cholecalciferol  1,000 Units Oral Daily   feeding supplement (NEPRO CARB STEADY)  237 mL Oral TID BM   ferrous sulfate  325 mg Oral Q breakfast   heparin injection (subcutaneous)  5,000 Units Subcutaneous Q8H   ipratropium  2 puff Inhalation TID   levalbuterol  2 puff Inhalation TID   multivitamin  1 tablet Oral QHS   multivitamin with minerals  1 tablet Oral Daily   omega-3 acid ethyl esters  1 g Oral Daily   pantoprazole  40 mg Oral QHS   polyethylene glycol  17 g Oral BID   [START ON 01/14/2021] predniSONE  50 mg Oral Daily   rosuvastatin  10 mg Oral QHS   zinc sulfate  220 mg Oral Daily   Continuous Infusions:  sodium chloride Stopped (01/05/21 1432)   sodium chloride     sodium chloride     piperacillin-tazobactam (ZOSYN)  IV 2.25 g (01/13/21 1425)   remdesivir 100 mg in NS 100 mL 100 mg (01/13/21 1049)     LOS: 10 days    Enzo Bi, MD [facility] If 7PM-7AM, please contact night-coverage 01/13/2021, 4:58 PM

## 2021-01-13 NOTE — Progress Notes (Signed)
Confused to place and taking off oxygen and monitoring devices. Stating, "Im getting out of here and going home to be with my wife. I dont need to be here. Why am I here?" Attempts to re-orient unsuccessful and Ativan IV given for anxiety. He continues to state, I feel fine. I dont have COVID." Continuing to monitor

## 2021-01-13 NOTE — Consult Note (Signed)
Consultation Note Date: 01/13/2021   Patient Name: Phillip Chavez  DOB: 06-27-1940  MRN: [id number]  Age / Sex: 80 y.o., male  PCP: [facility] [place] Referring Physician: Enzo Bi, MD  Reason for Consultation: Establishing goals of care  HPI/Patient Profile: 80 y.o. male  with past medical history of htn, CAD, CHF, COPD, GERD, and HLD admitted on 01/03/2021 with shortness of breath. Met sepsis criteria on admission with temperature of 100.4, leukocytosis, and chest x-ray concerning for right middle lobe infiltrate and streaky bilateral basilar opacities. Blood cultures with Enterococcus faecalis. Contracted COVID during hospitalization. Developed worsening respiratory failure 11/11 and required bipap but has been weaned to HFNC. Also with mod to severe aortic regurgitation and possible pulmonary edema but cannot tolerate diuresis d/t AKI. Pt was started on temporary dialysis - has had 3 sessions, planning for 4th session 11/16. PMT consulted to discuss [place].   Clinical Assessment and Goals of Care: I have reviewed medical records including EPIC notes, labs and imaging, received report from RN, assessed the patient and then spoke with patient's wife to discuss diagnosis prognosis, GOC, EOL wishes, disposition and options.  I introduced Palliative Medicine as specialized medical care for people living with serious illness. It focuses on providing relief from the symptoms and stress of a serious illness. The goal is to improve quality of life for both the patient and the family.  As far as functional and nutritional status, wife tells me of a decline at home mostly d/t his shortness of breath. She tells me he was not on oxygen but was frequently trying to catch his breath. She tells me of frequent hospitalizations since August. She also tells me of very poor appetite since August. She tells me he was mostly able to still care for himself  independently and could ambulate with aid or assistance.  Cognition intact at home.    We discussed patient's current illness and what it means in the larger context of patient's on-going co-morbidities.  Natural disease trajectory and expectations at EOL were discussed. We discuss Mr. Acree respiratory failure and improvement - off of bipap - now down to 5 L [place]. We discuss covid infection. We discuss his kidney injury. She asks me to describe how bad his kidneys are and I share about very little urine output and ongoing need for HD. We also discuss ongoing AMS - discuss need for ativan overnight and somnolence this AM.   I attempted to elicit values and goals of care important to the patient.  We review conversations earlier in hospitalization that patient would not want CPR or intubation. She confirms this is correct.   We discuss his ongoing need for HD. I ask her if patient has ever shared his thoughts about HD with her - he has not. Wife seems to indicate if patient did need long-term HD she would agree to this but she is hopeful patient's mental status improves so he can be included in conversation.   Discussed with wife the importance of continued conversation with family and the medical providers regarding overall plan of care and treatment options, ensuring decisions are within the context of the patient's values and GOCs.    We discuss PMT will follow along during hospitalization - she is agreeable to this.   Questions and concerns were addressed. The family was encouraged to call with questions or concerns.   Primary Decision Maker NEXT OF KIN - wife Steffan Caniglia (pt somnolent this AM and unable to participate  in [place] discussion r/t ativan administration overnight)   SUMMARY OF RECOMMENDATIONS   - DNR/DNI - wife agreeable to ongoing HD, would consider long-term if needed but she is hopeful to discuss this further with patient - unsure if he would agree to this -PMT will follow -  plan to reach out to wife again 11/16 - she has my contact info and will call for further questions/concerns  Code Status/Advance Care Planning: DNR  Discharge Planning: To Be Determined      Primary Diagnoses: Present on Admission: . CAP (community acquired pneumonia) . Sepsis ([place]) . COPD (chronic obstructive pulmonary disease) ([place]) . GERD (gastroesophageal reflux disease) . HTN (hypertension) . HLD (hyperlipidemia) . Acute respiratory failure with hypoxia ([place]) . Elevated troponin . AKI (acute kidney injury) ([place]) . Normocytic anemia   I have reviewed the medical record, interviewed the patient and family, and examined the patient. The following aspects are pertinent.  Past Medical History:  Diagnosis Date  . Chronic CHF (congestive heart failure) ([place])   . COPD (chronic obstructive pulmonary disease) ([place])   . GERD (gastroesophageal reflux disease)   . HLD (hyperlipidemia)   . HTN (hypertension)    Social History   Socioeconomic History  . Marital status: Married    Spouse name: Not on file  . Number of children: Not on file  . Years of education: Not on file  . Highest education level: Not on file  Occupational History  . Not on file  Tobacco Use  . Smoking status: Former    Packs/day: 1.00    Years: 20.00    Pack years: 20.00    Types: Cigarettes    Quit date: 03/21/1978    Years since quitting: 42.8  . Smokeless tobacco: Never  Substance and Sexual Activity  . Alcohol use: Not Currently  . Drug use: Never  . Sexual activity: Not on file  Other Topics Concern  . Not on file  Social History Narrative  . Not on file   Social Determinants of Health   Financial Resource Strain: Not on file  Food Insecurity: Not on file  Transportation Needs: Not on file  Physical Activity: Not on file  Stress: Not on file  Social Connections: Not on file   Family History  Problem Relation Age of Onset  . Prostate cancer Father   . Prostate cancer Brother   .  Pancreatic cancer Brother    Scheduled Meds: . vitamin C  500 mg Oral Daily  . aspirin EC  81 mg Oral Daily  . Chlorhexidine Gluconate Cloth  6 each Topical Daily  . Chlorhexidine Gluconate Cloth  6 each Topical Q0600  . cholecalciferol  1,000 Units Oral Daily  . feeding supplement (NEPRO CARB STEADY)  237 mL Oral TID BM  . ferrous sulfate  325 mg Oral Q breakfast  . heparin injection (subcutaneous)  5,000 Units Subcutaneous Q8H  . ipratropium  2 puff Inhalation TID  . levalbuterol  2 puff Inhalation TID  . methylPREDNISolone (SOLU-MEDROL) injection  1 mg/kg Intravenous Q12H   Followed by  . [START ON 01/14/2021] predniSONE  50 mg Oral Daily  . multivitamin  1 tablet Oral QHS  . multivitamin with minerals  1 tablet Oral Daily  . omega-3 acid ethyl esters  1 g Oral Daily  . pantoprazole  40 mg Oral QHS  . polyethylene glycol  17 g Oral BID  . rosuvastatin  10 mg Oral QHS  . zinc sulfate  220 mg Oral Daily  Continuous Infusions: . sodium chloride Stopped (01/05/21 1432)  . sodium chloride    . sodium chloride    . piperacillin-tazobactam (ZOSYN)  IV 2.25 g (01/13/21 7858)  . remdesivir 100 mg in NS 100 mL 100 mg (01/13/21 1049)   PRN Meds:.sodium chloride, sodium chloride, sodium chloride, acetaminophen, albuterol, alteplase, dextromethorphan-guaiFENesin, heparin, hydrALAZINE, lidocaine (PF), lidocaine-prilocaine, LORazepam **OR** LORazepam, morphine injection, ondansetron (ZOFRAN) IV, pentafluoroprop-tetrafluoroeth Allergies  Allergen Reactions  . Aggrenox [Aspirin-Dipyridamole Er] Nausea And Vomiting  . Atorvastatin Other (See Comments)    Muscle pain  . Azithromycin   . Cefpodoxime Other (See Comments)    headache  . Ciprofloxacin   . Claritin [Loratadine] Other (See Comments)    Urinary retention  . Contrast Media [Iodinated Diagnostic Agents] Hives  . Doxazosin Other (See Comments)    Elevated blood pressure  . Doxycycline   . Equal [Aspartame] Diarrhea  .  Fenofibrate Other (See Comments)    Muscle pain  . Flomax [Tamsulosin] Other (See Comments)    Elevated blood pressure  . Flunisolide Other (See Comments)    Burning sensation  . Fluticasone-Salmeterol     Throat irriation  . Fluvastatin Other (See Comments)    Muscle pain  . Gemfibrozil Other (See Comments)    Muscle pain  . Haemophilus Influenzae Vaccines Other (See Comments)    Chest pain  . Hydrochlorothiazide Other (See Comments)    weakness  . Levofloxacin Other (See Comments)    Nervousness, aggitation  . Lisinopril Other (See Comments)    Chest pain  . Moxifloxacin Other (See Comments)    Altered behavior  . Niacin And Related Other (See Comments)    flushing  . Oxycodone   . Plavix [Clopidogrel] Other (See Comments)    Constriction in throat  . Pravastatin Other (See Comments)    Elevated blood pressure  . Simvastatin Other (See Comments)    Muscle pain  . Terazosin Other (See Comments)    Fatigue   . Zetia [Ezetimibe] Other (See Comments)    Edema   . Amoxicillin Palpitations    Chest pain  . Mometasone Palpitations    Elevated blood pressure  . Sulfa Antibiotics Rash and Hypertension   Review of Systems  Unable to perform ROS: Mental status change   Physical Exam Constitutional:      General: He is not in acute distress.    Comments: somnolent  Cardiovascular:     Rate and Rhythm: Normal rate and regular rhythm.  Pulmonary:     Effort: Pulmonary effort is normal.     Comments: 5L [place] Skin:    General: Skin is warm and dry.    Vital Signs: BP (!) 143/46   Pulse 72   Temp (!) 97.2 F (36.2 C)   Resp 20   Ht '5\' 5"'  (1.651 m)   Wt 82.2 kg   SpO2 98%   BMI 30.16 kg/m  Pain Scale: 0-10 POSS *See Group Information*: 1-Acceptable,Awake and alert Pain Score: 0-No pain   SpO2: SpO2: 98 % O2 Device:SpO2: 98 % O2 Flow Rate: .O2 Flow Rate (L/min): 5 L/min  IO: Intake/output summary:  Intake/Output Summary (Last 24 hours) at 01/13/2021  1205 Last data filed at 01/13/2021 0100 Gross per 24 hour  Intake 119.8 ml  Output 2051 ml  Net -1931.2 ml    LBM: Last BM Date: 01/13/21 Baseline Weight: Weight: 78 kg Most recent weight: Weight: 82.2 kg     Palliative Assessment/Data: PPS 10-20% this AM d/t AMS (likely  r/t medication administration and PPS may actually be much better when medication effect decreases)    Time Total: 65 minutes Greater than 50%  of this time was spent counseling and coordinating care related to the above assessment and plan.  Juel Burrow, DNP, AGNP-C Palliative Medicine Team [phone] Pager: [phone]

## 2021-01-13 NOTE — Progress Notes (Signed)
[facility]  PROGRESS NOTE   Subjective:   Hemodialysis treatment yesterday. UF of 2 liters. Third treatment yesterday.   Remains anuric with 86mL of UOP  Confused overnight. Patient given lorazepam 0.5mg  at 2029. Currently somnolent.   Weaned [place] to 5 Liters.   Objective:  Vital signs in last 24 hours:  Temp:  [97.2 F (36.2 C)-97.5 F (36.4 C)] 97.2 F (36.2 C) (11/14 2100) Pulse Rate:  [56-94] 57 (11/15 0900) Resp:  [10-25] 14 (11/15 0900) BP: (93-148)/(32-48) 102/35 (11/15 0900) SpO2:  [88 %-99 %] 98 % (11/15 0900) Weight:  [82.2 kg] 82.2 kg (11/15 0500)  Weight change: -2 kg Filed Weights   01/11/21 1149 01/12/21 0453 01/13/21 0500  Weight: 84.2 kg 82.1 kg 82.2 kg    Intake/Output: I/O last 3 completed shifts: In: 297.1 [IV Piggyback:297.1] Out: 2076 [Urine:75; Other:2001]   Intake/Output this shift:  No intake/output data recorded.  Physical Exam: General:  No acute distress  Head:  Normocephalic, atraumatic. Moist oral mucosal membranes  Eyes:  Anicteric  Neck:  Supple  Lungs:   Clear to auscultation, normal effort  Heart:  regular  Abdomen:   Soft, nontender, bowel sounds present  Extremities:  ++ peripheral edema.  Neurologic:  Somnolent.   Skin:  No lesions  Access: Right femoral temp HD catheter 29/56    Basic Metabolic Panel: Recent Labs  Lab 01/09/21 0543 01/10/21 0500 01/10/21 1444 01/11/21 0422 01/11/21 0915 01/12/21 0436 01/13/21 0843  NA 135 136 137  --  136 136 137  K 4.7 5.9* 5.2*  --  4.8 4.2 4.2  CL 103 103 101  --  102 100 99  CO2 22 19* 19*  --  20* 24 25  GLUCOSE 140* 133* 157*  --  153* 162* 155*  BUN 46* 67* 78*  --  71* 63* 59*  CREATININE 3.48* 5.27* 6.14*  --  5.47* 5.05* 5.06*  CALCIUM 8.3* 8.4* 8.5*  --  8.0* 8.3* 8.0*  MG 2.4 2.7*  --  2.4  --  2.4 2.5*  PHOS  --  9.4* 9.1* 7.2* 8.2* 7.2*  --      CBC: Recent Labs  Lab 01/10/21 0500 01/10/21 1400 01/11/21 0422 01/12/21 0436 01/13/21 0843   WBC 17.3* 14.7* 10.9* 10.6* 8.5  HGB 10.7* 10.6* 10.0* 9.9* 9.6*  HCT 34.5* 31.8* 30.0* 30.1* 28.7*  MCV 90.6 87.8 85.5 86.0 86.2  PLT 278 313 256 298 237      Urinalysis: No results for input(s): COLORURINE, LABSPEC, PHURINE, GLUCOSEU, HGBUR, BILIRUBINUR, KETONESUR, PROTEINUR, UROBILINOGEN, NITRITE, LEUKOCYTESUR in the last 72 hours.  Invalid input(s): APPERANCEUR     Imaging: No results found.   Medications:    sodium chloride Stopped (01/05/21 1432)   sodium chloride     sodium chloride     piperacillin-tazobactam (ZOSYN)  IV 2.25 g (01/13/21 2130)   remdesivir 100 mg in NS 100 mL 100 mg (01/12/21 1100)    vitamin C  500 mg Oral Daily   aspirin EC  81 mg Oral Daily   Chlorhexidine Gluconate Cloth  6 each Topical Daily   Chlorhexidine Gluconate Cloth  6 each Topical Q0600   cholecalciferol  1,000 Units Oral Daily   feeding supplement (NEPRO CARB STEADY)  237 mL Oral TID BM   ferrous sulfate  325 mg Oral Q breakfast   heparin injection (subcutaneous)  5,000 Units Subcutaneous Q8H   ipratropium  2 puff Inhalation TID   levalbuterol  2 puff  Inhalation TID   methylPREDNISolone (SOLU-MEDROL) injection  1 mg/kg Intravenous Q12H   Followed by   Derrill Memo ON 01/14/2021] predniSONE  50 mg Oral Daily   metoprolol tartrate  25 mg Oral BID   multivitamin  1 tablet Oral QHS   multivitamin with minerals  1 tablet Oral Daily   omega-3 acid ethyl esters  1 g Oral Daily   pantoprazole  40 mg Oral QHS   polyethylene glycol  17 g Oral BID   rosuvastatin  10 mg Oral QHS   zinc sulfate  220 mg Oral Daily    Assessment/ Plan:     Principal Problem:   Acute respiratory failure with hypoxia (HCC) Active Problems:   CAP (community acquired pneumonia)   Sepsis ([place])   COPD (chronic obstructive pulmonary disease) (HCC)   GERD (gastroesophageal reflux disease)   HTN (hypertension)   HLD (hyperlipidemia)   CHF exacerbation (HCC)   Elevated troponin   AKI (acute kidney injury)  ([place])   Normocytic anemia   Aortic valve regurgitation  Phillip Chavez is a 80 y.o. white male with hypertension, coronary artery disease, congestive heart failure, COPD, GERD, hyperlipidemia, admitted to [facility] on CAP (community acquired pneumonia) [J18.9] AKI (acute kidney injury) ([place]) [N17.9] Community acquired pneumonia, unspecified laterality [J18.9] Congestive heart failure, unspecified HF chronicity, unspecified heart failure type ([place]) [I50.9]    #1: Acute kidney injury with hyperkalemia: on chronic kidney disease stage IIIB, baseline 1.64, GFR of 42 on admission.  Requiring hemodialysis. Resistant to furosemide.  - No indication for dialysis today.  - Monitor daily for dialysis need. Anticipate dialysis for tomorrow.   #2. Hypertension: with chronic kidney disease. 113/37 - Continue metoprolol.   #3. Sepsis: E. Faecalis on blood cultures on 11/5.  - IV pip/tazo - Appreciate ICU and ID input.   #4. Acute respiratory failure with COVID-19 infection and pulmonary edema  - IV steroids.  - IV remdesivir.  - Continue supportive care. Weaning oxygen requirements.     LOS: [place], [facility] 11/15/202210:18 AM

## 2021-01-14 DIAGNOSIS — U071 COVID-19: Secondary | ICD-10-CM

## 2021-01-14 DIAGNOSIS — K5900 Constipation, unspecified: Secondary | ICD-10-CM | POA: Diagnosis not present

## 2021-01-14 DIAGNOSIS — J9601 Acute respiratory failure with hypoxia: Secondary | ICD-10-CM | POA: Diagnosis not present

## 2021-01-14 DIAGNOSIS — Z7189 Other specified counseling: Secondary | ICD-10-CM | POA: Diagnosis not present

## 2021-01-14 DIAGNOSIS — Z515 Encounter for palliative care: Secondary | ICD-10-CM | POA: Diagnosis not present

## 2021-01-14 DIAGNOSIS — N179 Acute kidney failure, unspecified: Secondary | ICD-10-CM | POA: Diagnosis not present

## 2021-01-14 DIAGNOSIS — J189 Pneumonia, unspecified organism: Secondary | ICD-10-CM | POA: Diagnosis not present

## 2021-01-14 DIAGNOSIS — Z66 Do not resuscitate: Secondary | ICD-10-CM | POA: Diagnosis not present

## 2021-01-14 LAB — GLUCOSE, CAPILLARY
Glucose-Capillary: 161 mg/dL — ABNORMAL HIGH (ref 70–99)
Glucose-Capillary: 156 mg/dL — ABNORMAL HIGH (ref 70–99)
Glucose-Capillary: 149 mg/dL — ABNORMAL HIGH (ref 70–99)

## 2021-01-14 LAB — CBC
HCT: 29.7 % — ABNORMAL LOW (ref 39.0–52.0)
Hemoglobin: 10.1 g/dL — ABNORMAL LOW (ref 13.0–17.0)
MCH: 29.2 pg (ref 26.0–34.0)
MCHC: 34 g/dL (ref 30.0–36.0)
MCV: 85.8 fL (ref 80.0–100.0)
Platelets: 269 10*3/uL (ref 150–400)
RBC: 3.46 MIL/uL — ABNORMAL LOW (ref 4.22–5.81)
RDW: 15.1 % (ref 11.5–15.5)
WBC: 10.5 10*3/uL (ref 4.0–10.5)
nRBC: 0 % (ref 0.0–0.2)

## 2021-01-14 LAB — BASIC METABOLIC PANEL
Anion gap: 16 — ABNORMAL HIGH (ref 5–15)
BUN: 89 mg/dL — ABNORMAL HIGH (ref 8–23)
CO2: 22 mmol/L (ref 22–32)
Calcium: 7.9 mg/dL — ABNORMAL LOW (ref 8.9–10.3)
Chloride: 99 mmol/L (ref 98–111)
Creatinine, Ser: 6.62 mg/dL — ABNORMAL HIGH (ref 0.61–1.24)
GFR, Estimated: 8 mL/min — ABNORMAL LOW (ref >60)
Glucose, Bld: 156 mg/dL — ABNORMAL HIGH (ref 70–99)
Potassium: 4.5 mmol/L (ref 3.5–5.1)
Sodium: 137 mmol/L (ref 135–145)

## 2021-01-14 LAB — MAGNESIUM: Magnesium: 2.7 mg/dL — ABNORMAL HIGH (ref 1.7–2.4)

## 2021-01-14 MED ORDER — SODIUM CHLORIDE 0.9 % IV SOLN
2.0000 g | Freq: Two times a day (BID) | INTRAVENOUS | Status: DC
  Filled 2021-01-14 (×3): qty 2000

## 2021-01-14 NOTE — Progress Notes (Signed)
[facility]  PROGRESS NOTE   Subjective:   Moved out of ICU. Remains on 5 liters nasal canula.   Seen and examined on hemodialysis treatment. Tolerating treatment well.   UOP 63mL    HEMODIALYSIS FLOWSHEET:  Blood Flow Rate (mL/min): 400 mL/min Arterial Pressure (mmHg): -220 mmHg Venous Pressure (mmHg): 170 mmHg Transmembrane Pressure (mmHg): 70 mmHg Ultrafiltration Rate (mL/min): 830 mL/min Dialysate Flow Rate (mL/min): 500 ml/min Conductivity: Machine : 14.1 Conductivity: Machine : 14.1 Dialysis Fluid Bolus: Normal Saline Bolus Amount (mL): 250 mL   Objective:  Vital signs in last 24 hours:  Temp:  [97.3 F (36.3 C)-98 F (36.7 C)] 97.6 F (36.4 C) (11/16 0840) Pulse Rate:  [63-73] 70 (11/16 0413) Resp:  [13-18] 14 (11/16 1130) BP: (118-147)/(30-48) 125/32 (11/16 1130) SpO2:  [91 %-99 %] 99 % (11/16 0840) Weight:  [75.8 kg] 75.8 kg (11/16 0500)  Weight change: -6.4 kg Filed Weights   01/12/21 0453 01/13/21 0500 01/14/21 0500  Weight: 82.1 kg 82.2 kg 75.8 kg    Intake/Output: I/O last 3 completed shifts: In: 669.4 [P.O.:240; IV Piggyback:429.4] Out: 80 [Urine:80]   Intake/Output this shift:  No intake/output data recorded.  Physical Exam: General:  No acute distress, sitting up in bed  Head:  Normocephalic, atraumatic. Moist oral mucosal membranes  Eyes:  Anicteric  Neck:  Supple  Lungs:   Clear to auscultation, normal effort  Heart:  regular  Abdomen:   Soft, nontender, bowel sounds present  Extremities:  ++ peripheral edema.  Neurologic:  Somnolent.   Skin:  No lesions  Access: Right femoral temp HD catheter 78/58    Basic Metabolic Panel: Recent Labs  Lab 01/10/21 0500 01/10/21 1444 01/11/21 0422 01/11/21 0915 01/12/21 0436 01/13/21 0843 01/14/21 0445  NA 136 137  --  136 136 137 137  K 5.9* 5.2*  --  4.8 4.2 4.2 4.5  CL 103 101  --  102 100 99 99  CO2 19* 19*  --  20* 24 25 22   GLUCOSE 133* 157*  --  153* 162* 155* 156*   BUN 67* 78*  --  71* 63* 59* 89*  CREATININE 5.27* 6.14*  --  5.47* 5.05* 5.06* 6.62*  CALCIUM 8.4* 8.5*  --  8.0* 8.3* 8.0* 7.9*  MG 2.7*  --  2.4  --  2.4 2.5* 2.7*  PHOS 9.4* 9.1* 7.2* 8.2* 7.2*  --   --      CBC: Recent Labs  Lab 01/10/21 1400 01/11/21 0422 01/12/21 0436 01/13/21 0843 01/14/21 0445  WBC 14.7* 10.9* 10.6* 8.5 10.5  HGB 10.6* 10.0* 9.9* 9.6* 10.1*  HCT 31.8* 30.0* 30.1* 28.7* 29.7*  MCV 87.8 85.5 86.0 86.2 85.8  PLT 313 256 298 237 269      Urinalysis: No results for input(s): COLORURINE, LABSPEC, PHURINE, GLUCOSEU, HGBUR, BILIRUBINUR, KETONESUR, PROTEINUR, UROBILINOGEN, NITRITE, LEUKOCYTESUR in the last 72 hours.  Invalid input(s): APPERANCEUR     Imaging: No results found.   Medications:    sodium chloride Stopped (01/05/21 1432)   sodium chloride     sodium chloride     piperacillin-tazobactam (ZOSYN)  IV 2.25 g (01/14/21 0708)   remdesivir 100 mg in NS 100 mL Stopped (01/13/21 1119)    vitamin C  500 mg Oral Daily   aspirin EC  81 mg Oral Daily   Chlorhexidine Gluconate Cloth  6 each Topical Daily   cholecalciferol  1,000 Units Oral Daily   feeding supplement (NEPRO CARB STEADY)  237  mL Oral TID BM   ferrous sulfate  325 mg Oral Q breakfast   heparin injection (subcutaneous)  5,000 Units Subcutaneous Q8H   ipratropium  2 puff Inhalation TID   levalbuterol  2 puff Inhalation TID   multivitamin  1 tablet Oral QHS   multivitamin with minerals  1 tablet Oral Daily   omega-3 acid ethyl esters  1 g Oral Daily   pantoprazole  40 mg Oral QHS   polyethylene glycol  17 g Oral BID   predniSONE  50 mg Oral Daily   rosuvastatin  10 mg Oral QHS   zinc sulfate  220 mg Oral Daily    Assessment/ Plan:     Principal Problem:   Acute respiratory failure with hypoxia (HCC) Active Problems:   CAP (community acquired pneumonia)   Sepsis ([place])   COPD (chronic obstructive pulmonary disease) (HCC)   GERD (gastroesophageal reflux disease)   HTN  (hypertension)   HLD (hyperlipidemia)   Elevated troponin   AKI (acute kidney injury) ([place])   Normocytic anemia   Aortic valve regurgitation   COVID-19 virus infection  [name] is a 80 y.o. 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  Requiring hemodialysis. Resistant to furosemide.  - Dialysis today.  - Plan on restarting IV loop diuretic at high dose tomorrow and monitor for response.  - Currently with no signs of renal recovery. Recommend vascular to place a tunneled dialysis catheter once patient is out of isolation.    #2. Hypertension: with chronic kidney disease. 125/32 - Continue metoprolol.   #3. Sepsis: E. Faecalis on blood cultures on 11/5.  - IV pip/tazo with end date of 11/19.  - Appreciate ID input.   #4. Acute respiratory failure with COVID-19 infection and pulmonary edema. Continues to have high oxygen requirements.  - IV steroids.  - IV remdesivir.  - O2.  - Continue supportive care. Weaning oxygen requirements.     LOS: [place], [facility] 11/16/202211:44 AM

## 2021-01-14 NOTE — Progress Notes (Signed)
Hemodialysis:  Tolerated 3hours of treatment with 2liters net removal. CVC lumens wrapped with gauze/taped.

## 2021-01-14 NOTE — Progress Notes (Signed)
Talked to patient's wife Kalman Shan, updated patient status. Plans to visit patient this Friday.

## 2021-01-14 NOTE — Progress Notes (Signed)
PROGRESS NOTE  Phillip Chavez DJT:[id number] DOB: Feb 24, 1941 DOA: 01/03/2021 PCP: [facility]  HPI/Recap of past 110 hours: 80 year old male with past medical history of hypertension, CAD, COPD and hyperlipidemia admitted on 11/5 for shortness of breath that have been going on for over a week prior.  Patient found to have sepsis on admission secondary to pneumonia with positive blood cultures for Enterococcus.  Complicating matters were that patient contracted COVID wife during hospitalization and developed worsening respiratory failure on 11/11 requiring BiPAP and placement in stepdown unit.  Found to also have moderate to severe aortic regurg with pulmonary edema.  Acute kidney injury worsened with attempted diuresis and patient started on temporary dialysis.  Patient also has issues with nightly sundowning.  Currently down to 5 L nasal cannula.  Palliative care consulted on 11/15.  Patient continues to get dialysis with nephrology considering permacath when he is off isolation.  Assessment/Plan: Principal Problem:   Acute respiratory failure with hypoxia ([place]): Secondary to COVID infection, community-acquired pneumonia and volume overload from severe aortic regurgitation: Continue diuresis, Remdisivir.  Has completed IV antibiotics.  Down to 5 L nasal cannula.  Echocardiogram noted preserved ejection fraction and no evidence of diastolic dysfunction. Active Problems:  Enterococcus sepsis ([place])  Met criteria for sepsis on admission given leukocytosis and heart rate greater than 90.  Appreciate infectious disease help.  Sepsis is since resolved.  On IV Zosyn until 11/19.  Enterococcus not really associated with pneumonia      GERD (gastroesophageal reflux disease): Continue PPI.    HTN (hypertension): Holding Cozaar and metoprolol due to low episodes of blood pressure.    HLD (hyperlipidemia): Continue statin.    Elevated troponin: Felt to be demand ischemia, likely from  sepsis/poor valvular function.   AKI (acute kidney injury) ([place]) in the setting of stage IIIb chronic kidney disease: Appreciate nephrology help.  Considering PermCath when he is off isolation.  Nephrology has reached out to vascular surgery.  Not much improvement in his renal function though.    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  Continue to follow.   Aortic valve regurgitation   COVID-19 virus infection: On IV Remdisivir.  CRP stable.   Code Status: DNR  Family Communication: Left message for wife  Disposition Plan: Currently on MedSurg.  Palliative care following.   Consultants: Palliative care Nephrology Vascular surgery  Procedures: 2D echo: Preserved ejection fraction, severe moderate to severe aortic regurg, no evidence of diastolic dysfunction TEE: No evidence of endocarditis Continued hemodialysis  Antimicrobials: IV cefepime and vancomycin 11/5-11/11 IV Zosyn 11/11-11/19 IV Remdisivir 11/14-11/18  DVT prophylaxis: Subcu heparin  Level of care: Med-Surg   Objective: Vitals:   01/14/21 1000 01/14/21 1015  BP: (!) 119/35 (!) 147/36  Pulse:    Resp: 15 16  Temp:    SpO2:      Intake/Output Summary (Last 24 hours) at 01/14/2021 1022 Last data filed at 01/14/2021 0400 Gross per 24 hour  Intake 549.56 ml  Output 30 ml  Net 519.56 ml   Filed Weights   01/12/21 0453 01/13/21 0500 01/14/21 0500  Weight: 82.1 kg 82.2 kg 75.8 kg   Body mass index is 27.81 kg/m.  Exam:  General: Alert and oriented x2, no acute distress HEENT: Normocephalic and atraumatic, mucous membranes are Cardiovascular: Regular rate and rhythm, S1-S2 Respiratory: Breathing more labored, decreased breath sounds throughout, some crackles at bases Abdomen: Soft, nontender, nondistended, positive bowel sounds Musculoskeletal: No clubbing or cyanosis, 2+ pitting edema  Psychiatry: Appropriate, no evidence of psychoses   Data Reviewed: CBC: Recent Labs  Lab  01/10/21 1400 01/11/21 0422 01/12/21 0436 01/13/21 0843 01/14/21 0445  WBC 14.7* 10.9* 10.6* 8.5 10.5  HGB 10.6* 10.0* 9.9* 9.6* 10.1*  HCT 31.8* 30.0* 30.1* 28.7* 29.7*  MCV 87.8 85.5 86.0 86.2 85.8  PLT 313 256 298 237 431   Basic Metabolic Panel: Recent Labs  Lab 01/10/21 0500 01/10/21 1444 01/11/21 0422 01/11/21 0915 01/12/21 0436 01/13/21 0843 01/14/21 0445  NA 136 137  --  136 136 137 137  K 5.9* 5.2*  --  4.8 4.2 4.2 4.5  CL 103 101  --  102 100 99 99  CO2 19* 19*  --  20* _0 GLUCOSE 133* 157*  --  153* 162* 155* 156*  BUN 67* 78*  --  71* 63* 59* 89*  CREATININE 5.27* 6.14*  --  5.47* 5.05* 5.06* 6.62*  CALCIUM 8.4* 8.5*  --  8.0* 8.3* 8.0* 7.9*  MG 2.7*  --  2.4  --  2.4 2.5* 2.7*  PHOS 9.4* 9.1* 7.2* 8.2* 7.2*  --   --    GFR: Estimated Creatinine Clearance: 8.2 mL/min (A) (by C-G formula based on SCr of 6.62 mg/dL (H)). Liver Function Tests: Recent Labs  Lab 01/10/21 1444 01/11/21 0915 01/12/21 0436 01/13/21 0843  AST  --   --  44* 31  ALT  --   --  69* 55*  ALKPHOS  --   --  83 83  BILITOT  --   --  1.0 0.7  PROT  --   --  6.7 6.0*  ALBUMIN 2.7* 2.4* 2.6* 2.4*   No results for input(s): LIPASE, AMYLASE in the last 168 hours. No results for input(s): AMMONIA in the last 168 hours. Coagulation Profile: No results for input(s): INR, PROTIME in the last 168 hours. Cardiac Enzymes: No results for input(s): CKTOTAL, CKMB, CKMBINDEX, TROPONINI in the last 168 hours. BNP (last 3 results) No results for input(s): PROBNP in the last 8760 hours. HbA1C: No results for input(s): HGBA1C in the last 72 hours. CBG: Recent Labs  Lab 01/13/21 1055 01/13/21 1654 01/13/21 2012 01/13/21 2345 01/14/21 0414  GLUCAP 166* 158* 225* 216* 161*   Lipid Profile: No results for input(s): CHOL, HDL, LDLCALC, TRIG, CHOLHDL, LDLDIRECT in the last 72 hours. Thyroid Function Tests: No results for input(s): TSH, T4TOTAL, FREET4, T3FREE, THYROIDAB in the last 72  hours. Anemia Panel: Recent Labs    01/12/21 0436 01/13/21 0843  FERRITIN 628* 560*   Urine analysis:    Component Value Date/Time   COLORURINE YELLOW (A) 01/09/2021 0952   APPEARANCEUR CLOUDY (A) 01/09/2021 0952   LABSPEC 1.023 01/09/2021 0952   PHURINE 5.0 01/09/2021 0952   GLUCOSEU 50 (A) 01/09/2021 0952   HGBUR MODERATE (A) 01/09/2021 0952   BILIRUBINUR NEGATIVE 01/09/2021 0952   KETONESUR 5 (A) 01/09/2021 0952   PROTEINUR 30 (A) 01/09/2021 0952   NITRITE NEGATIVE 01/09/2021 0952   LEUKOCYTESUR NEGATIVE 01/09/2021 0952   Sepsis Labs: _1 (procalcitonin:4,lacticidven:4)  ) Recent Results (from the past 240 hour(s))  Urine Culture     Status: None   Collection Time: 01/04/21  5:21 PM   Specimen: Urine, Clean Catch  Result Value Ref Range Status   Specimen Description   Final    URINE, CLEAN CATCH Performed at [facility], [address], [place], [place] [address]    Special Requests   Final    NONE  Performed at [facility], [address], [place], [place] [address]    Culture   Final    NO GROWTH Performed at [facility], [place] [address] [place], [place] [address]    Report Status 01/06/2021 FINAL  Final  CULTURE, BLOOD (ROUTINE X 2) w Reflex to ID Panel     Status: None   Collection Time: 01/06/21  5:28 AM   Specimen: BLOOD  Result Value Ref Range Status   Specimen Description BLOOD LEFT FA  Final   Special Requests   Final    BOTTLES DRAWN AEROBIC AND ANAEROBIC Blood Culture adequate volume   Culture   Final    NO GROWTH 5 DAYS Performed at [facility], [address] [place], [place] [address]    Report Status 01/11/2021 FINAL  Final  CULTURE, BLOOD (ROUTINE X 2) w Reflex to ID Panel     Status: None   Collection Time: 01/06/21  5:29 AM   Specimen: BLOOD  Result Value Ref Range Status   Specimen Description BLOOD LEFT HAND  Final   Special Requests   Final    BOTTLES DRAWN AEROBIC AND ANAEROBIC Blood  Culture adequate volume   Culture   Final    NO GROWTH 5 DAYS Performed at [facility], [address] [place], [place] [address]    Report Status 01/11/2021 FINAL  Final  MRSA Next Gen by PCR, Nasal     Status: None   Collection Time: 01/09/21  9:50 AM   Specimen: Nasal Mucosa; Nasal Swab  Result Value Ref Range Status   MRSA by PCR Next Gen NOT DETECTED NOT DETECTED Final    Comment: (NOTE) The GeneXpert MRSA Assay (FDA approved for NASAL specimens only), is one component of a comprehensive MRSA colonization surveillance program. It is not intended to diagnose MRSA infection nor to guide or monitor treatment for MRSA infections. Test performance is not FDA approved in patients less than 31 years old. Performed at [facility], [address] [place], [place] [address]   Resp Panel by RT-PCR (Flu A&B, Covid) Nasopharyngeal Swab     Status: Abnormal   Collection Time: 01/10/21 10:46 PM   Specimen: Nasopharyngeal Swab; Nasopharyngeal(NP) swabs in vial transport medium  Result Value Ref Range Status   SARS Coronavirus 2 by RT PCR POSITIVE (A) NEGATIVE Final    Comment: RESULT CALLED TO, READ BACK BY AND VERIFIED WITH: ERICKA MCMILLIAN @ 0025 01/11/21 LFD (NOTE) SARS-CoV-2 target nucleic acids are DETECTED.  The SARS-CoV-2 RNA is generally detectable in upper respiratory specimens during the acute phase of infection. Positive results are indicative of the presence of the identified virus, but do not rule out bacterial infection or co-infection with other pathogens not detected by the test. Clinical correlation with patient history and other diagnostic information is necessary to determine patient infection status. The expected result is Negative.  Fact Sheet for Patients: EntrepreneurPulse.com.au  Fact Sheet for Healthcare Providers: IncredibleEmployment.be  This test is not yet approved or cleared by the [place]  FDA and  has been authorized for detection and/or diagnosis of SARS-CoV-2 by FDA under an Emergency Use Authorization (EUA).  This EUA will remain in effect (meaning this test can  be used) for the duration of  the COVID-19 declaration under Section 564(b)(1) of the Act, 21 U.S.C. section 360bbb-3(b)(1), unless the authorization is terminated or revoked sooner.     Influenza A by PCR NEGATIVE NEGATIVE Final   Influenza  B by PCR NEGATIVE NEGATIVE Final    Comment: (NOTE) The Xpert Xpress SARS-CoV-2/FLU/RSV plus assay is intended as an aid in the diagnosis of influenza from Nasopharyngeal swab specimens and should not be used as a sole basis for treatment. Nasal washings and aspirates are unacceptable for Xpert Xpress SARS-CoV-2/FLU/RSV testing.  Fact Sheet for Patients: EntrepreneurPulse.com.au  Fact Sheet for Healthcare Providers: IncredibleEmployment.be  This test is not yet approved or cleared by the [place] FDA and has been authorized for detection and/or diagnosis of SARS-CoV-2 by FDA under an Emergency Use Authorization (EUA). This EUA will remain in effect (meaning this test can be used) for the duration of the COVID-19 declaration under Section 564(b)(1) of the Act, 21 U.S.C. section 360bbb-3(b)(1), unless the authorization is terminated or revoked.  Performed at [facility], [address] [place], [place] [address]       Studies: No results found.  Scheduled Meds:  vitamin C  500 mg Oral Daily   aspirin EC  81 mg Oral Daily   Chlorhexidine Gluconate Cloth  6 each Topical Daily   cholecalciferol  1,000 Units Oral Daily   feeding supplement (NEPRO CARB STEADY)  237 mL Oral TID BM   ferrous sulfate  325 mg Oral Q breakfast   heparin injection (subcutaneous)  5,000 Units Subcutaneous Q8H   ipratropium  2 puff Inhalation TID   levalbuterol  2 puff Inhalation TID   multivitamin  1 tablet Oral QHS    multivitamin with minerals  1 tablet Oral Daily   omega-3 acid ethyl esters  1 g Oral Daily   pantoprazole  40 mg Oral QHS   polyethylene glycol  17 g Oral BID   predniSONE  50 mg Oral Daily   rosuvastatin  10 mg Oral QHS   zinc sulfate  220 mg Oral Daily    Continuous Infusions:  sodium chloride Stopped (01/05/21 1432)   sodium chloride     sodium chloride     piperacillin-tazobactam (ZOSYN)  IV 2.25 g (01/14/21 0708)   remdesivir 100 mg in NS 100 mL Stopped (01/13/21 1119)     LOS: 11 days     Annita Brod, MD [facility]   01/14/2021, 10:22 AM

## 2021-01-14 NOTE — Progress Notes (Signed)
Daily Progress Note   Patient Name: Phillip Chavez       Date: 01/14/2021 DOB: October 06, 1940  Age: 80 y.o. MRN#: [id number] Attending Physician: Annita Brod, MD Primary Care Physician: [facility] Admit Date: 01/03/2021  Reason for Consultation/Follow-up: Establishing goals of care  Subjective: Much more alert today, tells me he does not feel well but cannot specify, tells me he does not remember why he is so sick  Length of Stay: 11  Current Medications: Scheduled Meds:  . vitamin C  500 mg Oral Daily  . aspirin EC  81 mg Oral Daily  . Chlorhexidine Gluconate Cloth  6 each Topical Daily  . cholecalciferol  1,000 Units Oral Daily  . feeding supplement (NEPRO CARB STEADY)  237 mL Oral TID BM  . ferrous sulfate  325 mg Oral Q breakfast  . heparin injection (subcutaneous)  5,000 Units Subcutaneous Q8H  . ipratropium  2 puff Inhalation TID  . levalbuterol  2 puff Inhalation TID  . multivitamin  1 tablet Oral QHS  . multivitamin with minerals  1 tablet Oral Daily  . omega-3 acid ethyl esters  1 g Oral Daily  . pantoprazole  40 mg Oral QHS  . polyethylene glycol  17 g Oral BID  . predniSONE  50 mg Oral Daily  . rosuvastatin  10 mg Oral QHS  . zinc sulfate  220 mg Oral Daily    Continuous Infusions: . sodium chloride Stopped (01/05/21 1432)  . sodium chloride    . sodium chloride    . piperacillin-tazobactam (ZOSYN)  IV 2.25 g (01/14/21 0708)  . remdesivir 100 mg in NS 100 mL Stopped (01/13/21 1119)    PRN Meds: sodium chloride, sodium chloride, sodium chloride, acetaminophen, albuterol, alteplase, dextromethorphan-guaiFENesin, heparin, hydrALAZINE, lidocaine (PF), lidocaine-prilocaine, LORazepam **OR** LORazepam, morphine injection, ondansetron (ZOFRAN) IV,  pentafluoroprop-tetrafluoroeth  Physical Exam Constitutional:      General: He is not in acute distress. Pulmonary:     Comments: Slightly labored during my visit as he was working with nurse tech Skin:    General: Skin is warm and dry.  Neurological:     Mental Status: He is alert.            Vital Signs: BP (!) 135/37   Pulse 70   Temp 97.8 F (36.6 C) (Oral)   Resp 17   Ht $R'5\' 5"'pI$  (1.651 m)   Wt 69.8 kg Comment: bedscale  SpO2 99%   BMI 25.61 kg/m  SpO2: SpO2: 99 % O2 Device: O2 Device: Nasal Cannula O2 Flow Rate: O2 Flow Rate (L/min): 5 L/min  Intake/output summary:  Intake/Output Summary (Last 24 hours) at 01/14/2021 1352 Last data filed at 01/14/2021 0400 Gross per 24 hour  Intake 549.56 ml  Output 30 ml  Net 519.56 ml   LBM: Last BM Date: 01/14/21 Baseline Weight: Weight: 78 kg Most recent weight: Weight: 69.8 kg (bedscale)       Palliative Assessment/Data: PPS 40%    Flowsheet Rows    Flowsheet Row Most Recent Value  Intake Tab   Referral Department Critical care  Unit at Time of Referral ICU  Palliative Care Primary Diagnosis Pulmonary  Date Notified 01/10/21  Palliative Care Type New Palliative care  Reason for referral Clarify Goals of Care  Date of Admission 01/03/21  Date first seen by Palliative Care 01/13/21  # of days Palliative referral response time 3 Day(s)  # of days IP prior to Palliative referral 7  Clinical Assessment   Psychosocial & Spiritual Assessment   Palliative Care Outcomes        Patient Active Problem List   Diagnosis Date Noted  . COVID-19 virus infection 01/14/2021  . Aortic valve regurgitation   . CAP (community acquired pneumonia) 01/03/2021  . Sepsis ([place]) 01/03/2021  . COPD (chronic obstructive pulmonary disease) ([place]) 01/03/2021  . GERD (gastroesophageal reflux disease) 01/03/2021  . HTN (hypertension)   . HLD (hyperlipidemia)   . Acute respiratory failure with hypoxia ([place])   . Elevated troponin   .  AKI (acute kidney injury) ([place])   . Normocytic anemia     Palliative Care Assessment & Plan   HPI: 80 y.o. male  with past medical history of htn, CAD, CHF, COPD, GERD, and HLD admitted on 01/03/2021 with shortness of breath. Met sepsis criteria on admission with temperature of 100.4, leukocytosis, and chest x-ray concerning for right middle lobe infiltrate and streaky bilateral basilar opacities. Blood cultures with Enterococcus faecalis. Contracted COVID during hospitalization. Developed worsening respiratory failure 11/11 and required bipap but has been weaned to HFNC. Also with mod to severe aortic regurgitation and possible pulmonary edema but cannot tolerate diuresis d/t AKI. Pt was started on temporary dialysis - has had 3 sessions, planning for 4th session 11/16. PMT consulted to discuss [place].   Assessment: Visited with patient - tells me he feels unwell. Does not know why he is so sick - we reviewed his diagnoses. I ask him how he feels about dialysis and he tells me he is unsure. Has not eaten today r/t HD - I ask him if he will eat lunch but he tells me he does not have much of an appetite. Encouraged him to attempt some intake.   Spoke with wife, Phillip Chavez. Reviewed situation. She expresses understanding. She is quite relieved he is more alert today. We review ongoing need for HD and no signs of renal recovery to this point. She expresses understanding and tells me they will continue HD because "they have to". I ask her if she thinks this is what Phillip Chavez wants and she tells me they had a brief conversation about it and she told him they would get him to HD no matter what it took and he agreed to it. I encouraged ongoing conversation about this topic. She is currently covid positive but plans to test herself Friday and hopeful she can visit then - encouraged her to discuss care moving forward with Phillip Chavez during her visit and let her know we can assist with those conversations. She is agreeable.    Recommendations/Plan: DNR/DNI Wife very interested in continuing HD, unclear how patient feels - encouraged ongoing conversation between patient and wife PMT will follow - will request Jordan Hawks, NP follow up this week  Code Status: DNR  Discharge Planning: To Be Determined  Care plan was discussed with wife  Thank you for allowing the Palliative Medicine Team to assist in the care of this patient.   Total Time 20 minutes Prolonged Time Billed  no       Greater than 50%  of this time was spent counseling and coordinating care related to the above assessment and plan.  Lawrence Santiago  Cherlynn Polo, [facility] Palliative Medicine Team Team Phone # [phone]  Pager [phone]

## 2021-01-15 DIAGNOSIS — R0602 Shortness of breath: Secondary | ICD-10-CM | POA: Diagnosis not present

## 2021-01-15 DIAGNOSIS — I509 Heart failure, unspecified: Secondary | ICD-10-CM

## 2021-01-15 DIAGNOSIS — I351 Nonrheumatic aortic (valve) insufficiency: Secondary | ICD-10-CM

## 2021-01-15 DIAGNOSIS — J189 Pneumonia, unspecified organism: Secondary | ICD-10-CM | POA: Diagnosis not present

## 2021-01-15 DIAGNOSIS — I10 Essential (primary) hypertension: Secondary | ICD-10-CM

## 2021-01-15 DIAGNOSIS — N179 Acute kidney failure, unspecified: Secondary | ICD-10-CM | POA: Diagnosis not present

## 2021-01-15 LAB — CBC
HCT: 32.2 % — ABNORMAL LOW (ref 39.0–52.0)
Hemoglobin: 10.6 g/dL — ABNORMAL LOW (ref 13.0–17.0)
MCH: 28.6 pg (ref 26.0–34.0)
MCHC: 32.9 g/dL (ref 30.0–36.0)
MCV: 87 fL (ref 80.0–100.0)
Platelets: 247 10*3/uL (ref 150–400)
RBC: 3.7 MIL/uL — ABNORMAL LOW (ref 4.22–5.81)
RDW: 15.3 % (ref 11.5–15.5)
WBC: 13.5 10*3/uL — ABNORMAL HIGH (ref 4.0–10.5)
nRBC: 0 % (ref 0.0–0.2)

## 2021-01-15 LAB — BASIC METABOLIC PANEL
Anion gap: 18 — ABNORMAL HIGH (ref 5–15)
BUN: 72 mg/dL — ABNORMAL HIGH (ref 8–23)
CO2: 22 mmol/L (ref 22–32)
Calcium: 7.8 mg/dL — ABNORMAL LOW (ref 8.9–10.3)
Chloride: 96 mmol/L — ABNORMAL LOW (ref 98–111)
Creatinine, Ser: 6.26 mg/dL — ABNORMAL HIGH (ref 0.61–1.24)
GFR, Estimated: 8 mL/min — ABNORMAL LOW (ref >60)
Glucose, Bld: 131 mg/dL — ABNORMAL HIGH (ref 70–99)
Potassium: 4.1 mmol/L (ref 3.5–5.1)
Sodium: 136 mmol/L (ref 135–145)

## 2021-01-15 LAB — GLUCOSE, CAPILLARY
Glucose-Capillary: 124 mg/dL — ABNORMAL HIGH (ref 70–99)
Glucose-Capillary: 125 mg/dL — ABNORMAL HIGH (ref 70–99)
Glucose-Capillary: 126 mg/dL — ABNORMAL HIGH (ref 70–99)
Glucose-Capillary: 143 mg/dL — ABNORMAL HIGH (ref 70–99)
Glucose-Capillary: 156 mg/dL — ABNORMAL HIGH (ref 70–99)

## 2021-01-15 LAB — MAGNESIUM: Magnesium: 2.5 mg/dL — ABNORMAL HIGH (ref 1.7–2.4)

## 2021-01-15 MED ORDER — SODIUM CHLORIDE 0.9 % IV SOLN
100.0000 mg | Freq: Every day | INTRAVENOUS | Status: AC
  Administered 2021-01-15: 18:00:00 100 mg via INTRAVENOUS
  Filled 2021-01-15: qty 20

## 2021-01-15 MED ORDER — SODIUM CHLORIDE 0.9 % IV SOLN
2.0000 g | Freq: Two times a day (BID) | INTRAVENOUS | Status: AC
  Administered 2021-01-15 – 2021-01-18 (×5): 2 g via INTRAVENOUS
  Filled 2021-01-15 (×2): qty 2000
  Filled 2021-01-15: qty 2
  Filled 2021-01-15 (×3): qty 2000

## 2021-01-15 MED ORDER — FUROSEMIDE 10 MG/ML IJ SOLN
80.0000 mg | Freq: Once | INTRAMUSCULAR | Status: AC
  Administered 2021-01-15: 18:00:00 80 mg via INTRAVENOUS
  Filled 2021-01-15: qty 8

## 2021-01-15 NOTE — Progress Notes (Signed)
   01/14/21 0902 01/14/21 0915 01/14/21 0930  Vitals  BP (!) 137/40 (!) 128/42 (!) 136/35  MAP (mmHg) 68 67 (!) 63  ECG Heart Rate 69 67 64  Resp 17 16 15   During Hemodialysis Assessment  Blood Flow Rate (mL/min) 400 mL/min 400 mL/min 400 mL/min  Arterial Pressure (mmHg) -150 mmHg -150 mmHg -150 mmHg  Venous Pressure (mmHg) 120 mmHg 120 mmHg 120 mmHg  Transmembrane Pressure (mmHg) 80 mmHg 80 mmHg 80 mmHg  Ultrafiltration Rate (mL/min) 830 mL/min 830 mL/min 830 mL/min  Dialysate Flow Rate (mL/min) 500 ml/min 500 ml/min 500 ml/min  Conductivity: Machine  14.1 14.1 14.2  HD Safety Checks Performed Yes Yes Yes  Dialysis Fluid Bolus Normal Saline  --   --   Bolus Amount (mL) 250 mL  --   --   Intra-Hemodialysis Comments Tx initiated Progressing as prescribed Progressing as prescribed    01/14/21 0945 01/14/21 1000 01/14/21 1015  Vitals  BP (!) 126/48 (!) 119/35 (!) 147/36  MAP (mmHg) 65 (!) 59 67  ECG Heart Rate 62 62 70  Resp 15 15 16   During Hemodialysis Assessment  Blood Flow Rate (mL/min) 400 mL/min 400 mL/min 400 mL/min  Arterial Pressure (mmHg) -190 mmHg -190 mmHg -200 mmHg  Venous Pressure (mmHg) 160 mmHg 160 mmHg 170 mmHg  Transmembrane Pressure (mmHg) 60 mmHg 60 mmHg 60 mmHg  Ultrafiltration Rate (mL/min) 830 mL/min 830 mL/min 830 mL/min  Dialysate Flow Rate (mL/min) 500 ml/min 500 ml/min 500 ml/min  Conductivity: Machine  14 14 14.2  HD Safety Checks Performed Yes Yes Yes  Dialysis Fluid Bolus  --   --   --   Bolus Amount (mL)  --   --   --   Intra-Hemodialysis Comments Progressing as prescribed Progressing as prescribed Progressing as prescribed

## 2021-01-15 NOTE — Progress Notes (Signed)
[facility]  PROGRESS NOTE   Subjective:   Hemodialysis treatment yesterday. Tolerated treatment well. Patient states he is still make urine. Flow sheet records 271m for last 24 hours.  UF of 2 liters yesterday.   Patient states his shortness of breath is improving.   Patient met with palliative care yesterday. States he is willing to continue hemodialysis as long as he continues to improve.   Objective:  Vital signs in last 24 hours:  Temp:  [97.6 F (36.4 C)-99.3 F (37.4 C)] 98.7 F (37.1 C) (11/17 0816) Pulse Rate:  [66-78] 70 (11/17 0816) Resp:  [16-18] 18 (11/17 0816) BP: (123-136)/(37-79) 129/41 (11/17 0816) SpO2:  [92 %-96 %] 92 % (11/17 0816) Weight:  [69.8 kg] 69.8 kg (11/16 1208)  Weight change: -6 kg Filed Weights   01/13/21 0500 01/14/21 0500 01/14/21 1208  Weight: 82.2 kg 75.8 kg 69.8 kg    Intake/Output: I/O last 3 completed shifts: In: 559.6 [P.O.:240; Other:10; IV Piggyback:309.6] Out: 2200 [Urine:200; Other:2000]   Intake/Output this shift:  No intake/output data recorded.  Physical Exam: General:  No acute distress, laying in bed  Head:  Normocephalic, atraumatic. Moist oral mucosal membranes  Eyes:  Anicteric  Neck:  Supple  Lungs:   Clear to auscultation, [place] O2.   Heart:  regular  Abdomen:   Soft, nontender, bowel sounds present  Extremities:  + peripheral edema.  Neurologic:  Awake and alert  Skin:  No lesions  Access: Right femoral temp HD catheter 140/98   Basic Metabolic Panel: Recent Labs  Lab 01/10/21 0500 01/10/21 1444 01/11/21 0422 01/11/21 0915 01/12/21 0436 01/13/21 0843 01/14/21 0445 01/15/21 0856  NA 136 137  --  136 136 137 137 136  K 5.9* 5.2*  --  4.8 4.2 4.2 4.5 4.1  CL 103 101  --  102 100 99 99 96*  CO2 19* 19*  --  20* _0 GLUCOSE 133* 157*  --  153* 162* 155* 156* 131*  BUN 67* 78*  --  71* 63* 59* 89* 72*  CREATININE 5.27* 6.14*  --  5.47* 5.05* 5.06* 6.62* 6.26*  CALCIUM 8.4* 8.5*   --  8.0* 8.3* 8.0* 7.9* 7.8*  MG 2.7*  --  2.4  --  2.4 2.5* 2.7* 2.5*  PHOS 9.4* 9.1* 7.2* 8.2* 7.2*  --   --   --      CBC: Recent Labs  Lab 01/11/21 0422 01/12/21 0436 01/13/21 0843 01/14/21 0445 01/15/21 0856  WBC 10.9* 10.6* 8.5 10.5 13.5*  HGB 10.0* 9.9* 9.6* 10.1* 10.6*  HCT 30.0* 30.1* 28.7* 29.7* 32.2*  MCV 85.5 86.0 86.2 85.8 87.0  PLT 256 298 237 269 247      Urinalysis: No results for input(s): COLORURINE, LABSPEC, PHURINE, GLUCOSEU, HGBUR, BILIRUBINUR, KETONESUR, PROTEINUR, UROBILINOGEN, NITRITE, LEUKOCYTESUR in the last 72 hours.  Invalid input(s): APPERANCEUR     Imaging: No results found.   Medications:    sodium chloride 10 mL/hr at 01/14/21 2117   sodium chloride     sodium chloride     ampicillin (OMNIPEN) IV     remdesivir 100 mg in NS 100 mL 100 mg (01/14/21 1440)    vitamin C  500 mg Oral Daily   aspirin EC  81 mg Oral Daily   Chlorhexidine Gluconate Cloth  6 each Topical Daily   cholecalciferol  1,000 Units Oral Daily   feeding supplement (NEPRO CARB STEADY)  237 mL Oral TID BM  ferrous sulfate  325 mg Oral Q breakfast   heparin injection (subcutaneous)  5,000 Units Subcutaneous Q8H   ipratropium  2 puff Inhalation TID   levalbuterol  2 puff Inhalation TID   multivitamin  1 tablet Oral QHS   multivitamin with minerals  1 tablet Oral Daily   omega-3 acid ethyl esters  1 g Oral Daily   pantoprazole  40 mg Oral QHS   polyethylene glycol  17 g Oral BID   predniSONE  50 mg Oral Daily   rosuvastatin  10 mg Oral QHS   zinc sulfate  220 mg Oral Daily    Assessment/ Plan:     Principal Problem:   Acute respiratory failure with hypoxia (HCC) Active Problems:   CAP (community acquired pneumonia)   Sepsis ([place])   COPD (chronic obstructive pulmonary disease) (HCC)   GERD (gastroesophageal reflux disease)   HTN (hypertension)   HLD (hyperlipidemia)   Elevated troponin   AKI (acute kidney injury) ([place])   Normocytic anemia   Aortic  valve regurgitation   COVID-19 virus infection  [name] is a 80 y.o. white male with hypertension, coronary artery disease, congestive heart failure, COPD, GERD, hyperlipidemia, admitted to [facility] on CAP (community acquired pneumonia) [J18.9] AKI (acute kidney injury) ([place]) [N17.9] Community acquired pneumonia, unspecified laterality [J18.9] Congestive heart failure, unspecified HF chronicity, unspecified heart failure type ([place]) [I50.9]    #1: Acute kidney injury with hyperkalemia: on chronic kidney disease stage IIIB, baseline 1.64, GFR of 42 on admission.  Requiring hemodialysis. Resistant to furosemide.  - Continue to monitor for dialysis need.  - IV furosemide today to monitor for response - Currently with no signs of renal recovery. Recommend vascular to place a tunneled dialysis catheter once patient is out of isolation.   - initiate outpatient planning. Hep B core ordered.   #2. Hypertension: with chronic kidney disease. 129/41 - Continue metoprolol.   #3. Sepsis: E. Faecalis on blood cultures on 11/5.  - IV pip/tazo with end date of 11/19.  - Appreciate ID input.   #4. Acute respiratory failure with COVID-19 infection and pulmonary edema. Continues to have high oxygen requirements.     LOS: [place], [facility] 11/17/202212:03 PM

## 2021-01-15 NOTE — Progress Notes (Signed)
   01/12/21 1700 01/12/21 1715  Vitals  BP (!) 114/32 (!) 118/33  MAP (mmHg)  --  (!) 56  Pulse Rate 69 68  ECG Heart Rate 69 68  Resp 19 10  During Hemodialysis Assessment  Blood Flow Rate (mL/min) 400 mL/min  --   Arterial Pressure (mmHg) -190 mmHg  --   Venous Pressure (mmHg) 190 mmHg  --   Transmembrane Pressure (mmHg) 70 mmHg  --   Ultrafiltration Rate (mL/min) 830 mL/min  --   Dialysate Flow Rate (mL/min) 500 ml/min  --   Conductivity: Machine  14.1  --   HD Safety Checks Performed Yes  --   Intra-Hemodialysis Comments Progressing as prescribed Tx completed

## 2021-01-15 NOTE — Progress Notes (Signed)
   01/12/21 1545 01/12/21 1600 01/12/21 1615  Vitals  Temp  --  (!) 97.5 F (36.4 C)  --   Temp Source  --  Axillary  --   BP (!) 143/37 (!) 138/45 (!) 128/38  MAP (mmHg) 67 72 65  Pulse Rate 74 77 73  ECG Heart Rate 76 77 74  Resp (!) 22 (!) 22 (!) 22  During Hemodialysis Assessment  Blood Flow Rate (mL/min) 400 mL/min 400 mL/min 400 mL/min  Arterial Pressure (mmHg) -190 mmHg -190 mmHg -190 mmHg  Venous Pressure (mmHg) 190 mmHg 190 mmHg 190 mmHg  Transmembrane Pressure (mmHg) 70 mmHg 70 mmHg 70 mmHg  Ultrafiltration Rate (mL/min) 830 mL/min 830 mL/min 830 mL/min  Dialysate Flow Rate (mL/min) 500 ml/min 500 ml/min 500 ml/min  Conductivity: Machine  14.1 14.1 14.1  HD Safety Checks Performed Yes Yes Yes  Intra-Hemodialysis Comments Progressing as prescribed Progressing as prescribed Progressing as prescribed    01/12/21 1630 01/12/21 1645  Vitals  Temp  --   --   Temp Source  --   --   BP (!) 135/43 (!) 126/38  MAP (mmHg) 69 (!) 62  Pulse Rate 75 77  ECG Heart Rate 76 79  Resp 20 (!) 24  During Hemodialysis Assessment  Blood Flow Rate (mL/min) 400 mL/min 400 mL/min  Arterial Pressure (mmHg) -190 mmHg -190 mmHg  Venous Pressure (mmHg) 190 mmHg 190 mmHg  Transmembrane Pressure (mmHg) 70 mmHg 70 mmHg  Ultrafiltration Rate (mL/min) 830 mL/min 830 mL/min  Dialysate Flow Rate (mL/min) 500 ml/min 500 ml/min  Conductivity: Machine  14.1 14.1  HD Safety Checks Performed Yes Yes  Intra-Hemodialysis Comments Progressing as prescribed Progressing as prescribed

## 2021-01-15 NOTE — Progress Notes (Signed)
Patient refused 4am VS, heparin SQ, and CBG. Wants to talk to provider about going home.

## 2021-01-15 NOTE — Progress Notes (Signed)
Palliative Care Progress Note, Assessment & Plan   Patient Name: Phillip Chavez       Date: 01/15/2021 DOB: 1940/07/19  Age: 80 y.o. MRN#: [id number] Attending Physician: Annita Brod, MD Primary Care Physician: Philip Date: 01/03/2021  Reason for Consultation/Follow-up: Establishing goals of care  Subjective: Patient is resting in bed in no apparent distress.  Nasal cannula in place.  Patient denies pain, dyspnea, or anxiety.  Breakfast tray is at bedside with a few bites of cereal eaten.  Patient reports he is just not hungry and has not had an appetite since coming to the hospital.  HPI: Patient is an 80 year old male that presented to the hospital with shortness of breath.  He is medical history is significant for HTN, CAD, CHF, COPD, GERD, HLD, and moderate to severe aortic regurgitation.  During his hospitalization he developed worsening respiratory failure requiring BiPAP but is now on 5 L of nasal cannula.  He is also found to be COVID-positive on 11/12.  He is in acute kidney injury with possible pulmonary edema.  Creatinine continues to increase despite hemodialysis.  Palliative medicine team was consulted to discuss goals of care.  Summary of counseling/coordination of care: After reviewing the patient's chart, epic notes, labs, and images, I met with the patient at bedside.  I shared that I was part of the palliative medicine team and my job is to ensure that he understands was going on with his medical care and that his goals of care aligned with the choices he is making with his medical decisions.  Patient reports he understands and just wants to feel better.  I have asked him to elaborate on what "getting better" means to him.  He said he just wants to get well  and go home.  He wants to be with his family.  He says he is tired of staring at these 4 walls, does not think he is getting any better, and just wants to go home.  I outlined that despite hemodialysis his creatinine continues to increase.  I shared that these numbers show [place] that he is at end-stage renal disease.  Discussed reality of outpatient hemodialysis.  Discussed that should he not want to continue with hemodialysis that he could focus on quality of life and enjoy what ever time he has left at home with his family.  We reviewed the risks and benefits of continuing with the hemodialysis as well as the risks and benefits of not continue with hemodialysis.  A comfort pathway was introduced while also reminding the patient that a medically aggressive plan of care is also still an option.   I outlined best case and worst-case scenarios of a comfort pathway as well as a medically aggressive pathway.  As per nursing, patient has refused early morning medication and care.  I asked why he declined this.  He said he believes he is not getting better and why I take things that are not helping him.  Discussed that his kidney function is not getting better but that we are hopeful that his respiratory status will improve as he meets his 10-day isolation for COVID.  I shared that he also  has underlying comorbidities that are contributing to him not feeling well overall.  At this time patient would like to continue with dialysis.  He also asked that I speak with his wife and give her all of the updates as well as relay the information we reviewed today.  When asked to the patient wanted to continue continue with hemodialysis he said he did not have a choice.  I again reiterated that he does have a choice and that the medical team is making recommendations.  I highlighted that he is in charge and always has a choice in his plan of care.  Questions and concerns addressed.  I spoke with patient's son over the  telephone after my visit with the patient.  I conveyed all information as discussed above.  Son was appreciative of the update and suggested that I speak with the patient's wife.  I phoned the patient's wife but there was no answer.  I left a HIPAA appropriate voicemail.  I assured the patient and his son that I will continue to monitor the patient throughout his hospitalization.  Code Status: DNR  Prognosis: Unable to determine  Discharge Planning: To Be Determined  Recommendations/Plan: Continue with hemodialysis Treat the treatable  Care plan was discussed with patient, patient's son  Physical Exam Constitutional:      General: He is not in acute distress.    Appearance: He is not ill-appearing.  HENT:     Head: Normocephalic and atraumatic.  Cardiovascular:     Rate and Rhythm: Normal rate.  Pulmonary:     Effort: Pulmonary effort is normal.     Comments:  in place Abdominal:     Palpations: Abdomen is soft.  Skin:    General: Skin is warm and dry.  Neurological:     Mental Status: He is alert and oriented to person, place, and time.  Psychiatric:        Mood and Affect: Mood normal. Mood is not anxious.        Behavior: Behavior normal. Behavior is not agitated.            Flowsheet Rows    Flowsheet Row Most Recent Value  Intake Tab   Referral Department Critical care  Unit at Time of Referral ICU  Palliative Care Primary Diagnosis Pulmonary  Date Notified 01/10/21  Palliative Care Type New Palliative care  Reason for referral Clarify Goals of Care  Date of Admission 01/03/21  Date first seen by Palliative Care 01/13/21  # of days Palliative referral response time 3 Day(s)  # of days IP prior to Palliative referral 7  Clinical Assessment   Psychosocial & Spiritual Assessment   Palliative Care Outcomes         Total Time 35 minutes  Greater than 50%  of this time was spent counseling and coordinating care related to the above assessment and  plan.  Thank you for allowing the Palliative Medicine Team to assist in the care of this patient.  K. [employer]. Sawyer Ilsa Iha, FNP-BC Palliative Medicine Team Team Phone # [phone]

## 2021-01-15 NOTE — Progress Notes (Signed)
PROGRESS NOTE  BLISS TSANG ZOX:[id number] DOB: 08-22-1940 DOA: 01/03/2021 PCP: [facility]  HPI/Recap of past 42 hours: 80 year old male with past medical history of hypertension, CAD, COPD and hyperlipidemia admitted on 11/5 for shortness of breath that have been going on for over a week prior.  Patient found to have sepsis on admission secondary to pneumonia with positive blood cultures for Enterococcus.  Complicating matters were that patient contracted COVID wife during hospitalization and developed worsening respiratory failure on 11/11 requiring BiPAP and placement in stepdown unit.  Found to also have moderate to severe aortic regurg with pulmonary edema.  Acute kidney injury worsened with attempted diuresis and patient started on temporary dialysis.  Patient also has issues with nightly sundowning.  Currently down to 5 L nasal cannula.  Palliative care consulted on 11/15.  Patient continues to get dialysis with nephrology considering permacath when he is off isolation.  This morning, patient was frustrated saying that he just wanted to go home.  Refused his medicines.  Palliative care followed up with patient and he is not ready to stop treatments, but just felt like he was making much progress.  He did reiterate both palliative care and myself that he would like to continue with ongoing plan..  Assessment/Plan: Principal Problem:   Acute respiratory failure with hypoxia ([place]): Secondary to COVID infection, community-acquired pneumonia and volume overload from severe aortic regurgitation: Continue diuresis, Remdisivir.  Has completed IV antibiotics.  Down to 5 L nasal cannula.  Echocardiogram noted preserved ejection fraction and no evidence of diastolic dysfunction. Active Problems:  Enterococcus sepsis ([place])  Met criteria for sepsis on admission given leukocytosis and heart rate greater than 90.  Appreciate infectious disease help.  Sepsis is since resolved.  On IV Zosyn  until 11/19.  Enterococcus not really associated with pneumonia      GERD (gastroesophageal reflux disease): Continue PPI.    HTN (hypertension): Holding Cozaar and metoprolol due to low episodes of blood pressure.    HLD (hyperlipidemia): Continue statin.    Elevated troponin: Felt to be demand ischemia, likely from sepsis/poor valvular function.   AKI (acute kidney injury) ([place]) in the setting of stage IIIb chronic kidney disease: Appreciate nephrology help.  Considering PermCath when he is off isolation.  Nephrology has reached out to vascular surgery.  Creatinine still staying around 6.    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  Continue to follow.   Aortic valve regurgitation   COVID-19 virus infection: On IV Remdisivir.  CRP stable.   Code Status: DNR  Family Communication: Left message for wife  Disposition Plan: Currently on MedSurg.  Possible permacath when he is off of [place] isolation   Consultants: Palliative care Nephrology Vascular surgery  Procedures: 2D echo: Preserved ejection fraction, severe moderate to severe aortic regurg, no evidence of diastolic dysfunction TEE: No evidence of endocarditis Continued hemodialysis  Antimicrobials: IV cefepime and vancomycin 11/5-11/11 IV Zosyn 11/11-11/19 IV Remdisivir 11/14-11/18  DVT prophylaxis: Subcu heparin  Level of care: Med-Surg   Objective: Vitals:   01/15/21 0021 01/15/21 0816  BP: (!) 131/37 (!) 129/41  Pulse: 67 70  Resp: 16 18  Temp: 99.3 F (37.4 C) 98.7 F (37.1 C)  SpO2: 94% 92%    Intake/Output Summary (Last 24 hours) at 01/15/2021 1540 Last data filed at 01/15/2021 0457 Gross per 24 hour  Intake 10 ml  Output 200 ml  Net -190 ml    Filed Weights   01/13/21 0500 01/14/21  0500 01/14/21 1208  Weight: 82.2 kg 75.8 kg 69.8 kg   Body mass index is 25.61 kg/m.  Exam:  General: Alert and oriented x2, no acute distress HEENT: Normocephalic and atraumatic, mucous  membranes are Cardiovascular: Regular rate and rhythm, S1-S2 Respiratory: Breathing more labored, decreased breath sounds throughout, some crackles at bases Abdomen: Soft, nontender, nondistended, positive bowel sounds Musculoskeletal: No clubbing or cyanosis, 2+ pitting edema Psychiatry: Appropriate, no evidence of psychoses   Data Reviewed: CBC: Recent Labs  Lab 01/11/21 0422 01/12/21 0436 01/13/21 0843 01/14/21 0445 01/15/21 0856  WBC 10.9* 10.6* 8.5 10.5 13.5*  HGB 10.0* 9.9* 9.6* 10.1* 10.6*  HCT 30.0* 30.1* 28.7* 29.7* 32.2*  MCV 85.5 86.0 86.2 85.8 87.0  PLT 256 298 237 269 295    Basic Metabolic Panel: Recent Labs  Lab 01/10/21 0500 01/10/21 1444 01/11/21 0422 01/11/21 0915 01/12/21 0436 01/13/21 0843 01/14/21 0445 01/15/21 0856  NA 136 137  --  136 136 137 137 136  K 5.9* 5.2*  --  4.8 4.2 4.2 4.5 4.1  CL 103 101  --  102 100 99 99 96*  CO2 19* 19*  --  20* '24 25 22 22  ' GLUCOSE 133* 157*  --  153* 162* 155* 156* 131*  BUN 67* 78*  --  71* 63* 59* 89* 72*  CREATININE 5.27* 6.14*  --  5.47* 5.05* 5.06* 6.62* 6.26*  CALCIUM 8.4* 8.5*  --  8.0* 8.3* 8.0* 7.9* 7.8*  MG 2.7*  --  2.4  --  2.4 2.5* 2.7* 2.5*  PHOS 9.4* 9.1* 7.2* 8.2* 7.2*  --   --   --     GFR: Estimated Creatinine Clearance: 7.9 mL/min (A) (by C-G formula based on SCr of 6.26 mg/dL (H)). Liver Function Tests: Recent Labs  Lab 01/10/21 1444 01/11/21 0915 01/12/21 0436 01/13/21 0843  AST  --   --  44* 31  ALT  --   --  69* 55*  ALKPHOS  --   --  83 83  BILITOT  --   --  1.0 0.7  PROT  --   --  6.7 6.0*  ALBUMIN 2.7* 2.4* 2.6* 2.4*    No results for input(s): LIPASE, AMYLASE in the last 168 hours. No results for input(s): AMMONIA in the last 168 hours. Coagulation Profile: No results for input(s): INR, PROTIME in the last 168 hours. Cardiac Enzymes: No results for input(s): CKTOTAL, CKMB, CKMBINDEX, TROPONINI in the last 168 hours. BNP (last 3 results) No results for input(s):  PROBNP in the last 8760 hours. HbA1C: No results for input(s): HGBA1C in the last 72 hours. CBG: Recent Labs  Lab 01/14/21 1708 01/14/21 2104 01/15/21 0018 01/15/21 0807 01/15/21 1234  GLUCAP 156* 149* 143* 124* 156*    Lipid Profile: No results for input(s): CHOL, HDL, LDLCALC, TRIG, CHOLHDL, LDLDIRECT in the last 72 hours. Thyroid Function Tests: No results for input(s): TSH, T4TOTAL, FREET4, T3FREE, THYROIDAB in the last 72 hours. Anemia Panel: Recent Labs    01/13/21 0843  FERRITIN 560*    Urine analysis:    Component Value Date/Time   COLORURINE YELLOW (A) 01/09/2021 0952   APPEARANCEUR CLOUDY (A) 01/09/2021 0952   LABSPEC 1.023 01/09/2021 0952   PHURINE 5.0 01/09/2021 0952   GLUCOSEU 50 (A) 01/09/2021 0952   HGBUR MODERATE (A) 01/09/2021 0952   BILIRUBINUR NEGATIVE 01/09/2021 0952   KETONESUR 5 (A) 01/09/2021 0952   PROTEINUR 30 (A) 01/09/2021 0952   NITRITE NEGATIVE 01/09/2021 2841  LEUKOCYTESUR NEGATIVE 01/09/2021 0952   Sepsis Labs: '@LABRCNTIP' (procalcitonin:4,lacticidven:4)  ) Recent Results (from the past 240 hour(s))  CULTURE, BLOOD (ROUTINE X 2) w Reflex to ID Panel     Status: None   Collection Time: 01/06/21  5:28 AM   Specimen: BLOOD  Result Value Ref Range Status   Specimen Description BLOOD LEFT FA  Final   Special Requests   Final    BOTTLES DRAWN AEROBIC AND ANAEROBIC Blood Culture adequate volume   Culture   Final    NO GROWTH 5 DAYS Performed at [facility], [address] [place], [place] [address]    Report Status 01/11/2021 FINAL  Final  CULTURE, BLOOD (ROUTINE X 2) w Reflex to ID Panel     Status: None   Collection Time: 01/06/21  5:29 AM   Specimen: BLOOD  Result Value Ref Range Status   Specimen Description BLOOD LEFT HAND  Final   Special Requests   Final    BOTTLES DRAWN AEROBIC AND ANAEROBIC Blood Culture adequate volume   Culture   Final    NO GROWTH 5 DAYS Performed at [facility], [address], [place], [place] [address]    Report Status 01/11/2021 FINAL  Final  MRSA Next Gen by PCR, Nasal     Status: None   Collection Time: 01/09/21  9:50 AM   Specimen: Nasal Mucosa; Nasal Swab  Result Value Ref Range Status   MRSA by PCR Next Gen NOT DETECTED NOT DETECTED Final    Comment: (NOTE) The GeneXpert MRSA Assay (FDA approved for NASAL specimens only), is one component of a comprehensive MRSA colonization surveillance program. It is not intended to diagnose MRSA infection nor to guide or monitor treatment for MRSA infections. Test performance is not FDA approved in patients less than 68 years old. Performed at [facility], [address] [place], [place] [address]   Resp Panel by RT-PCR (Flu A&B, Covid) Nasopharyngeal Swab     Status: Abnormal   Collection Time: 01/10/21 10:46 PM   Specimen: Nasopharyngeal Swab; Nasopharyngeal(NP) swabs in vial transport medium  Result Value Ref Range Status   SARS Coronavirus 2 by RT PCR POSITIVE (A) NEGATIVE Final    Comment: RESULT CALLED TO, READ BACK BY AND VERIFIED WITH: ERICKA MCMILLIAN @ 0025 01/11/21 LFD (NOTE) SARS-CoV-2 target nucleic acids are DETECTED.  The SARS-CoV-2 RNA is generally detectable in upper respiratory specimens during the acute phase of infection. Positive results are indicative of the presence of the identified virus, but do not rule out bacterial infection or co-infection with other pathogens not detected by the test. Clinical correlation with patient history and other diagnostic information is necessary to determine patient infection status. The expected result is Negative.  Fact Sheet for Patients: EntrepreneurPulse.com.au  Fact Sheet for Healthcare Providers: IncredibleEmployment.be  This test is not yet approved or cleared by the [place] FDA and  has been authorized for detection and/or diagnosis of SARS-CoV-2 by FDA under an Emergency Use  Authorization (EUA).  This EUA will remain in effect (meaning this test can  be used) for the duration of  the COVID-19 declaration under Section 564(b)(1) of the Act, 21 U.S.C. section 360bbb-3(b)(1), unless the authorization is terminated or revoked sooner.     Influenza A by PCR NEGATIVE NEGATIVE Final   Influenza B by PCR NEGATIVE NEGATIVE Final    Comment: (NOTE) The Xpert Xpress SARS-CoV-2/FLU/RSV plus assay is intended as an aid in the diagnosis of influenza from  Nasopharyngeal swab specimens and should not be used as a sole basis for treatment. Nasal washings and aspirates are unacceptable for Xpert Xpress SARS-CoV-2/FLU/RSV testing.  Fact Sheet for Patients: EntrepreneurPulse.com.au  Fact Sheet for Healthcare Providers: IncredibleEmployment.be  This test is not yet approved or cleared by the [place] FDA and has been authorized for detection and/or diagnosis of SARS-CoV-2 by FDA under an Emergency Use Authorization (EUA). This EUA will remain in effect (meaning this test can be used) for the duration of the COVID-19 declaration under Section 564(b)(1) of the Act, 21 U.S.C. section 360bbb-3(b)(1), unless the authorization is terminated or revoked.  Performed at [facility], [address], [place], [place] [address]       Studies: No results found.  Scheduled Meds:  vitamin C  500 mg Oral Daily   aspirin EC  81 mg Oral Daily   Chlorhexidine Gluconate Cloth  6 each Topical Daily   cholecalciferol  1,000 Units Oral Daily   feeding supplement (NEPRO CARB STEADY)  237 mL Oral TID BM   ferrous sulfate  325 mg Oral Q breakfast   furosemide  80 mg Intravenous Once   heparin injection (subcutaneous)  5,000 Units Subcutaneous Q8H   ipratropium  2 puff Inhalation TID   levalbuterol  2 puff Inhalation TID   multivitamin  1 tablet Oral QHS   multivitamin with minerals  1 tablet Oral Daily   omega-3 acid ethyl esters  1  g Oral Daily   pantoprazole  40 mg Oral QHS   polyethylene glycol  17 g Oral BID   predniSONE  50 mg Oral Daily   rosuvastatin  10 mg Oral QHS   zinc sulfate  220 mg Oral Daily    Continuous Infusions:  sodium chloride 10 mL/hr at 01/14/21 2117   sodium chloride     sodium chloride     ampicillin (OMNIPEN) IV     remdesivir 100 mg in NS 100 mL 100 mg (01/14/21 1440)     LOS: 12 days     Annita Brod, MD [facility]   01/15/2021, 3:40 PM

## 2021-01-15 NOTE — Progress Notes (Signed)
   01/14/21 1030 01/14/21 1045 01/14/21 1100  Vitals  BP (!) 133/33 (!) 121/30 (!) 125/48  MAP (mmHg) (!) 62 (!) 59 71  ECG Heart Rate (!) 58 (!) 51 (!) 58  Resp 14 15 14   During Hemodialysis Assessment  Blood Flow Rate (mL/min) 400 mL/min 400 mL/min 400 mL/min  Arterial Pressure (mmHg) -210 mmHg -240 mmHg -240 mmHg  Venous Pressure (mmHg) 170 mmHg 160 mmHg 160 mmHg  Transmembrane Pressure (mmHg) 60 mmHg 60 mmHg 60 mmHg  Ultrafiltration Rate (mL/min) 830 mL/min 830 mL/min 830 mL/min  Dialysate Flow Rate (mL/min) 500 ml/min 500 ml/min 500 ml/min  Conductivity: Machine  14.5 14.2 14.1  HD Safety Checks Performed Yes Yes Yes  Intra-Hemodialysis Comments Progressing as prescribed Progressing as prescribed Progressing as prescribed    01/14/21 1115 01/14/21 1130 01/14/21 1145  Vitals  BP (!) 129/31 (!) 125/32 (!) 130/38  MAP (mmHg) (!) 59 (!) 58 65  ECG Heart Rate (!) 56 60 65  Resp 14 14 15   During Hemodialysis Assessment  Blood Flow Rate (mL/min) 400 mL/min 400 mL/min 400 mL/min  Arterial Pressure (mmHg) -220 mmHg -220 mmHg -230 mmHg  Venous Pressure (mmHg) 180 mmHg 170 mmHg 180 mmHg  Transmembrane Pressure (mmHg) 60 mmHg 70 mmHg 70 mmHg  Ultrafiltration Rate (mL/min) 830 mL/min 830 mL/min 830 mL/min  Dialysate Flow Rate (mL/min) 500 ml/min 500 ml/min 500 ml/min  Conductivity: Machine  14 14.1 14  HD Safety Checks Performed Yes Yes Yes  Intra-Hemodialysis Comments Progressing as prescribed Progressing as prescribed Progressing as prescribed

## 2021-01-15 NOTE — Progress Notes (Signed)
   01/12/21 1415 01/12/21 1430 01/12/21 1445  Vitals  BP (!) 125/41 (!) 135/44 (!) 128/44  MAP (mmHg) 65 69 68  BP Location Left Arm  --   --   BP Method Automatic  --   --   Patient Position (if appropriate) Lying  --   --   Pulse Rate 74 77 76  ECG Heart Rate 76 78 74  Resp (!) 24 (!) 23 (!) 21  During Hemodialysis Assessment  Blood Flow Rate (mL/min) 400 mL/min 400 mL/min 400 mL/min  Arterial Pressure (mmHg) -150 mmHg -180 mmHg -180 mmHg  Venous Pressure (mmHg) 160 mmHg 170 mmHg 170 mmHg  Transmembrane Pressure (mmHg) 70 mmHg 70 mmHg 70 mmHg  Ultrafiltration Rate (mL/min) 830 mL/min 830 mL/min 830 mL/min  Dialysate Flow Rate (mL/min) 500 ml/min 500 ml/min 500 ml/min  Conductivity: Machine  14.1 14.1 14.1  HD Safety Checks Performed Yes Yes Yes  Intra-Hemodialysis Comments Tx initiated (Treatment start at 1411) Progressing as prescribed Progressing as prescribed    01/12/21 1500 01/12/21 1515 01/12/21 1530  Vitals  BP (!) 122/35 (!) 143/48 (!) 136/42  MAP (mmHg) (!) 61 75 68  BP Location  --   --   --   BP Method  --   --   --   Patient Position (if appropriate)  --   --   --   Pulse Rate 65 74 70  ECG Heart Rate 67 74 70  Resp 17 (!) 21 17  During Hemodialysis Assessment  Blood Flow Rate (mL/min) 400 mL/min 400 mL/min 400 mL/min  Arterial Pressure (mmHg) -180 mmHg -180 mmHg -180 mmHg  Venous Pressure (mmHg) 170 mmHg 170 mmHg 170 mmHg  Transmembrane Pressure (mmHg) 70 mmHg 70 mmHg 70 mmHg  Ultrafiltration Rate (mL/min) 830 mL/min 830 mL/min 830 mL/min  Dialysate Flow Rate (mL/min) 500 ml/min 500 ml/min 500 ml/min  Conductivity: Machine  14.1 14.1 14.1  HD Safety Checks Performed Yes Yes Yes  Intra-Hemodialysis Comments Progressing as prescribed Progressing as prescribed Progressing as prescribed

## 2021-01-16 DIAGNOSIS — N186 End stage renal disease: Secondary | ICD-10-CM | POA: Diagnosis not present

## 2021-01-16 DIAGNOSIS — U071 COVID-19: Secondary | ICD-10-CM | POA: Diagnosis not present

## 2021-01-16 DIAGNOSIS — J9601 Acute respiratory failure with hypoxia: Secondary | ICD-10-CM | POA: Diagnosis not present

## 2021-01-16 DIAGNOSIS — I351 Nonrheumatic aortic (valve) insufficiency: Secondary | ICD-10-CM | POA: Diagnosis not present

## 2021-01-16 DIAGNOSIS — N179 Acute kidney failure, unspecified: Secondary | ICD-10-CM | POA: Diagnosis not present

## 2021-01-16 DIAGNOSIS — J449 Chronic obstructive pulmonary disease, unspecified: Secondary | ICD-10-CM | POA: Diagnosis not present

## 2021-01-16 LAB — CBC
HCT: 32.6 % — ABNORMAL LOW (ref 39.0–52.0)
Hemoglobin: 10.3 g/dL — ABNORMAL LOW (ref 13.0–17.0)
MCH: 31.3 pg (ref 26.0–34.0)
MCHC: 31.6 g/dL (ref 30.0–36.0)
MCV: 99.1 fL (ref 80.0–100.0)
Platelets: 204 10*3/uL (ref 150–400)
RBC: 3.29 MIL/uL — ABNORMAL LOW (ref 4.22–5.81)
RDW: 15.5 % (ref 11.5–15.5)
WBC: 4.4 10*3/uL (ref 4.0–10.5)
nRBC: 0 % (ref 0.0–0.2)

## 2021-01-16 LAB — BASIC METABOLIC PANEL
Anion gap: 6 (ref 5–15)
BUN: 28 mg/dL — ABNORMAL HIGH (ref 8–23)
CO2: 27 mmol/L (ref 22–32)
Calcium: 8.6 mg/dL — ABNORMAL LOW (ref 8.9–10.3)
Chloride: 104 mmol/L (ref 98–111)
Creatinine, Ser: 1.15 mg/dL (ref 0.61–1.24)
GFR, Estimated: >60 mL/min (ref >60)
Glucose, Bld: 105 mg/dL — ABNORMAL HIGH (ref 70–99)
Potassium: 4.3 mmol/L (ref 3.5–5.1)
Sodium: 137 mmol/L (ref 135–145)

## 2021-01-16 LAB — MAGNESIUM: Magnesium: 2.1 mg/dL (ref 1.7–2.4)

## 2021-01-16 LAB — HEPATITIS B CORE ANTIBODY, TOTAL: Hep B Core Total Ab: NONREACTIVE

## 2021-01-16 MED ORDER — HEPARIN SODIUM (PORCINE) 1000 UNIT/ML IJ SOLN
INTRAMUSCULAR | Status: AC
  Filled 2021-01-16: qty 1

## 2021-01-16 NOTE — Progress Notes (Signed)
Spoke with patient today to begin with outpatient hemodialysis placement. Patient stated that he would like to go to [facility]. Referral sent. Education was not provided at this time, due to patient being partially oriented. Will follow back up with patient on Monday. Please contact me with any dialysis placement concerns.  Elvera Bicker Dialysis Coordinator [phone]

## 2021-01-16 NOTE — Progress Notes (Addendum)
Refusing all treatment, medicine, labs, dialysis today. I offered support, chaplain, he declined. He keeps responding with "I'm dead." Alert and oriented x4. Gevena Barre, MD notified

## 2021-01-16 NOTE — Progress Notes (Signed)
[facility]  PROGRESS NOTE   Subjective:   Confused overnight.  IV furosemide yesterday with no response.   Patient met with palliative care  Seen and examined on hemodialysis treatment.     HEMODIALYSIS FLOWSHEET:  Blood Flow Rate (mL/min): 400 mL/min Arterial Pressure (mmHg): -170 mmHg Venous Pressure (mmHg): 170 mmHg Transmembrane Pressure (mmHg): 60 mmHg Ultrafiltration Rate (mL/min): 830 mL/min Dialysate Flow Rate (mL/min): 500 ml/min Conductivity: Machine : 13.8 Conductivity: Machine : 13.8 Dialysis Fluid Bolus: Normal Saline Bolus Amount (mL): 250 mL   Objective:  Vital signs in last 24 hours:  Temp:  [98.4 F (36.9 C)] 98.4 F (36.9 C) (11/17 2001) Pulse Rate:  [71] 71 (11/17 2001) Resp:  [14-16] 14 (11/18 1441) BP: (132)/(35) 132/35 (11/17 2001) SpO2:  [93 %] 93 % (11/17 2001) Weight:  [73 kg] 73 kg (11/18 0500)  Weight change: 3.2 kg Filed Weights   01/14/21 0500 01/14/21 1208 01/16/21 0500  Weight: 75.8 kg 69.8 kg 73 kg    Intake/Output: I/O last 3 completed shifts: In: 179.5 [I.V.:69.5; Other:10; IV Piggyback:100] Out: 200 [Urine:200]   Intake/Output this shift:  No intake/output data recorded.  Physical Exam: General:  No acute distress, laying in bed  Head:  Normocephalic, atraumatic. Moist oral mucosal membranes  Eyes:  Anicteric  Neck:  Supple  Lungs:   Clear to auscultation, [place] O2.   Heart:  regular  Abdomen:   Soft, nontender, bowel sounds present  Extremities:  + peripheral edema.  Neurologic:  Awake and alert  Skin:  No lesions  Access: Right femoral temp HD catheter 71/85    Basic Metabolic Panel: Recent Labs  Lab 01/10/21 0500 01/10/21 1444 01/11/21 0422 01/11/21 0915 01/12/21 0436 01/13/21 0843 01/14/21 0445 01/15/21 0856 01/16/21 0849  NA 136 137  --  136 136 137 137 136 137  K 5.9* 5.2*  --  4.8 4.2 4.2 4.5 4.1 4.3  CL 103 101  --  102 100 99 99 96* 104  CO2 19* 19*  --  20* _0 GLUCOSE  133* 157*  --  153* 162* 155* 156* 131* 105*  BUN 67* 78*  --  71* 63* 59* 89* 72* 28*  CREATININE 5.27* 6.14*  --  5.47* 5.05* 5.06* 6.62* 6.26* 1.15  CALCIUM 8.4* 8.5*  --  8.0* 8.3* 8.0* 7.9* 7.8* 8.6*  MG 2.7*  --  2.4  --  2.4 2.5* 2.7* 2.5* 2.1  PHOS 9.4* 9.1* 7.2* 8.2* 7.2*  --   --   --   --      CBC: Recent Labs  Lab 01/12/21 0436 01/13/21 0843 01/14/21 0445 01/15/21 0856 01/16/21 0849  WBC 10.6* 8.5 10.5 13.5* 4.4  HGB 9.9* 9.6* 10.1* 10.6* 10.3*  HCT 30.1* 28.7* 29.7* 32.2* 32.6*  MCV 86.0 86.2 85.8 87.0 99.1  PLT 298 237 269 247 204      Urinalysis: No results for input(s): COLORURINE, LABSPEC, PHURINE, GLUCOSEU, HGBUR, BILIRUBINUR, KETONESUR, PROTEINUR, UROBILINOGEN, NITRITE, LEUKOCYTESUR in the last 72 hours.  Invalid input(s): APPERANCEUR     Imaging: No results found.   Medications:    sodium chloride Stopped (01/15/21 0445)   sodium chloride     sodium chloride     ampicillin (OMNIPEN) IV Stopped (01/15/21 1825)    vitamin C  500 mg Oral Daily   aspirin EC  81 mg Oral Daily   Chlorhexidine Gluconate Cloth  6 each Topical Daily   cholecalciferol  1,000 Units  Oral Daily   feeding supplement (NEPRO CARB STEADY)  237 mL Oral TID BM   ferrous sulfate  325 mg Oral Q breakfast   heparin injection (subcutaneous)  5,000 Units Subcutaneous Q8H   ipratropium  2 puff Inhalation TID   levalbuterol  2 puff Inhalation TID   multivitamin  1 tablet Oral QHS   multivitamin with minerals  1 tablet Oral Daily   omega-3 acid ethyl esters  1 g Oral Daily   pantoprazole  40 mg Oral QHS   polyethylene glycol  17 g Oral BID   predniSONE  50 mg Oral Daily   rosuvastatin  10 mg Oral QHS   zinc sulfate  220 mg Oral Daily    Assessment/ Plan:     Principal Problem:   Acute respiratory failure with hypoxia (H[facility]) Active Problems:   CAP (community acquired pneumonia)   Sepsis ([place])   COPD (chronic obstructive pulmonary disease) (H[facility])   GERD (gastroesophageal  reflux disease)   HTN (hypertension)   HLD (hyperlipidemia)   Elevated troponin   AKI (acute kidney injury) ([place])   Normocytic anemia   Aortic valve regurgitation   COVID-19 virus infection  [name] is a 80 y.o. white male with hypertension, coronary artery disease, congestive heart failure, COPD, GERD, hyperlipidemia, admitted to [facility] on CAP (community acquired pneumonia) [J18.9] AKI (acute kidney injury) ([place]) [N17.9] Community acquired pneumonia, unspecified laterality [J18.9] Congestive heart failure, unspecified HF chronicity, unspecified heart failure type ([place]) [I50.9]    #1: Acute kidney injury with hyperkalemia: on chronic kidney disease stage IIIB, baseline 1.64, GFR of 42 on admission.  Requiring hemodialysis. Resistant to furosemide.  - Continue to monitor for dialysis need.  - IV furosemide today to monitor for response - Currently with no signs of renal recovery. Recommend vascular to place a tunneled dialysis catheter once patient is out of isolation.    #2. Hypertension: with chronic kidney disease.   - Continue metoprolol.   #3. Sepsis: E. Faecalis on blood cultures on 11/5.  - IV pip/tazo with end date of 11/19.  - Appreciate ID input.   #4. Acute respiratory failure with COVID-19 infection and pulmonary edema. Continues to have high oxygen requirements.   Appreciate palliative care consultation. Input is helpful.    LOS: Vazquez, [facility] 11/18/20223:04 PM

## 2021-01-16 NOTE — Progress Notes (Signed)
Patient refused treatment this am including meds (pm and am), labs, VS. 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

## 2021-01-16 NOTE — Progress Notes (Signed)
Palliative Care Progress Note, Assessment & Plan   Patient Name: Phillip Chavez       Date: 01/16/2021 DOB: 27-Sep-1940  Age: 80 y.o. MRN#: [id number] Attending Physician: Annita Brod, MD Primary Care Physician: [facility] Admit Date: 01/03/2021  Reason for Consultation/Follow-up: Establishing goals of care  Subjective: Patient is lying in bed in no apparent distress with nasal cannula in place.  Patient denies pain, shortness of breath, and anxiety.  HPI: Patient is an 80 year old male that presented to the hospital with shortness of breath.  He is medical history is significant for HTN, CAD, CHF, COPD, GERD, HLD, and moderate to severe aortic regurgitation.  During his hospitalization he developed worsening respiratory failure requiring BiPAP but is now on 5 L of nasal cannula.  He is also found to be COVID-positive on 11/12.  He is in acute kidney injury with possible pulmonary edema. Creatinine continues to increase despite hemodialysis.  Both yesterday and this morning patient has declined to take medications and cooperate with daily cares.   Palliative medicine team was consulted to discuss goals of care.  Summary of counseling/coordination of care: After reviewing the patient's chart, epic notes, labs, and images, met with the patient at bedside.  I gave a brief review of our discussions yesterday and asked if he had any further thoughts or concerns.  He shared he just needs to do what he needs to do.    I asked if he recalled declining medications and agreeing to hemodialysis this morning.  He says he does not remember doing that.  He said he is just ready to get the thing out of his leg and get home.  Discussed that removal of catheter would mean stopping hemodialysis.  He  shared he had wanted to continue with hemodialysis.  He again just continues to reiterate that he just wants to get home.  I again discussed the risks and benefits associated with continue with hemodialysis and with following a comfort care pathway.  Patient states he just wants to get well and get his kidneys to work again.  I educated him that his kidneys will likely never work correctly again and that is why hemodialysis has been used.  Hemodialysis is by no means a bridge or Band-Aid.  I shared that hemodialysis is meant to do his kidneys job since his kidneys can no longer perform that.  He said this was news to him.  I conveyed that I had shared the same information with his son over the phone yesterday.  I encouraged the patient to continue to think about what he would like his goals of care and outcomes of his medical treatment to be.  He says he just wants to get well and get home.  He continues to say he does not have a choice and just needs to do what he has to do.  I reiterated that he always has a choice.  I again outlined that he could continue hemodialysis or stop it.  The medical team is making recommendations but that the ultimate choice lives with him and his family.  Questions and concerns were addressed.  I will be off service until Monday.  I shared with the patient that he is able to call the palliative medicine team at anytime with questions or concerns.  I will continue to monitor the patient when I return on Monday and follow him throughout his hospitalization.  I conveyed all details of my discussion to Dr. Maryland Pink, who plans to speak with the son today.  Code Status: DNR  Prognosis: Unable to determine  Discharge Planning: To Be Determined  Care plan was discussed with patient, Dr. Maryland Pink  Physical Exam Constitutional:      General: He is not in acute distress.    Appearance: He is well-developed. He is not ill-appearing.  HENT:     Head: Normocephalic and  atraumatic.  Cardiovascular:     Rate and Rhythm: Normal rate.  Pulmonary:     Effort: Pulmonary effort is normal.     Breath sounds: Examination of the right-lower field reveals decreased breath sounds. Examination of the left-lower field reveals decreased breath sounds. Decreased breath sounds present.     Comments: Kennedy in place Musculoskeletal:     Comments: Generalized weakness  Skin:    General: Skin is warm and dry.  Neurological:     Mental Status: He is alert and oriented to person, place, and time.  Psychiatric:        Mood and Affect: Mood normal. Mood is not anxious.        Behavior: Behavior normal. Behavior is not agitated.            Flowsheet Rows    Flowsheet Row Most Recent Value  Intake Tab   Referral Department Critical care  Unit at Time of Referral ICU  Palliative Care Primary Diagnosis Pulmonary  Date Notified 01/10/21  Palliative Care Type New Palliative care  Reason for referral Clarify Goals of Care  Date of Admission 01/03/21  Date first seen by Palliative Care 01/13/21  # of days Palliative referral response time 3 Day(s)  # of days IP prior to Palliative referral 7  Clinical Assessment   Psychosocial & Spiritual Assessment   Palliative Care Outcomes         Total Time 25 minutes  Greater than 50%  of this time was spent counseling and coordinating care related to the above assessment and plan.  Thank you for allowing the Palliative Medicine Team to assist in the care of this patient.  [place] Ilsa Iha, FNP-BC Palliative Medicine Team Team Phone # [phone]

## 2021-01-16 NOTE — Progress Notes (Signed)
Call from [place] 10 beat run of [place]. Alert and no signs of acute distress. Refused vital signs. Gevena Barre, MD notified.

## 2021-01-16 NOTE — Progress Notes (Signed)
Per Dr. Maryland Pink patient agrees to do dialysis today

## 2021-01-16 NOTE — Progress Notes (Signed)
PROGRESS NOTE  Phillip Chavez SLP:[id number] DOB: 07/26/1940 DOA: 01/03/2021 PCP: [facility]  HPI/Recap of past 98 hours: 80 year old male with past medical history of hypertension, CAD, COPD and hyperlipidemia admitted on 11/5 for shortness of breath that have been going on for over a week prior.  Patient found to have sepsis on admission secondary to pneumonia with positive blood cultures for Enterococcus.  Complicating matters were that patient contracted COVID wife during hospitalization and developed worsening respiratory failure on 11/11 requiring BiPAP and placement in stepdown unit.  Found to also have moderate to severe aortic regurg with pulmonary edema.  Acute kidney injury worsened with attempted diuresis and patient started on temporary dialysis.  Patient also has issues with nightly sundowning.  Currently down to 5 L nasal cannula.  Palliative care consulted on 11/15.  Patient continues to get dialysis with nephrology considering permacath when he is off isolation.  By 11/17, patient frustrated, saying he wants to go home and refusing his medications.  Both palliative care and this attending followed up with patient who conveyed that he did not want to stop treatments, and that also he has full say over his care.  Impression we both received was that he wanted to proceed.  However, this morning, patient is again refusing labs as well as now all treatments including dialysis.    After discussion, patient states he is just tired and wants to go home, but relented to dialysis.  Assessment/Plan: Principal Problem:   Acute respiratory failure with hypoxia ([place]): Secondary to COVID infection, community-acquired pneumonia and volume overload from severe aortic regurgitation: Continue diuresis, Remdisivir.  Has completed IV antibiotics.  Down to 5 L nasal cannula.  Echocardiogram noted preserved ejection fraction and no evidence of diastolic dysfunction.  Realistically,  patient will likely stop dialysis to be made comfortable.  Active Problems:  Enterococcus sepsis ([place])  Met criteria for sepsis on admission given leukocytosis and heart rate greater than 90.  Appreciate infectious disease help.  Sepsis is since resolved.  On IV Zosyn until 11/19.  Enterococcus not really associated with pneumonia      GERD (gastroesophageal reflux disease): Continue PPI.    HTN (hypertension): Holding Cozaar and metoprolol due to low episodes of blood pressure.    HLD (hyperlipidemia): Continue statin.    Elevated troponin: Felt to be demand ischemia, likely from sepsis/poor valvular function.   AKI (acute kidney injury) ([place]) in the setting of stage IIIb chronic kidney disease: Appreciate nephrology help.  Considering PermCath when he is off isolation.  Nephrology has reached out to vascular surgery.  Creatinine still staying around 6.    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  Continue to follow.   Aortic valve regurgitation   COVID-19 virus infection: On IV Remdisivir.  CRP stable.   Code Status: DNR  Family Communication: Left message for wife  Disposition Plan: Currently on MedSurg.  Possible permacath when he is off of [place] isolation   Consultants: Palliative care Nephrology Vascular surgery  Procedures: 2D echo: Preserved ejection fraction, severe moderate to severe aortic regurg, no evidence of diastolic dysfunction TEE: No evidence of endocarditis Continued hemodialysis  Antimicrobials: IV cefepime and vancomycin 11/5-11/11 IV Zosyn 11/11-11/19 IV Remdisivir 11/14-11/18  DVT prophylaxis: Subcu heparin  Level of care: Med-Surg   Objective: Vitals:   01/15/21 0816 01/15/21 2001  BP: (!) 129/41 (!) 132/35  Pulse: 70 71  Resp: 18 16  Temp: 98.7 F (37.1 C) 98.4 F (36.9 C)  SpO2: 92% 93%    Intake/Output Summary (Last 24 hours) at 01/16/2021 0921 Last data filed at 01/16/2021 0424 Gross per 24 hour  Intake 169.49 ml   Output --  Net 169.49 ml    Filed Weights   01/14/21 0500 01/14/21 1208 01/16/21 0500  Weight: 75.8 kg 69.8 kg 73 kg   Body mass index is 26.78 kg/m.  Exam:  General: Alert and oriented x2, quiet HEENT: Normocephalic and atraumatic, mucous membranes are Cardiovascular: Regular rate and rhythm, S1-S2 Respiratory: decreased breath sounds throughout, some crackles at bases Abdomen: Soft, nontender, nondistended, positive bowel sounds Musculoskeletal: No clubbing or cyanosis, 2+ pitting edema Psychiatry: Appropriate, no evidence of psychoses   Data Reviewed: CBC: Recent Labs  Lab 01/11/21 0422 01/12/21 0436 01/13/21 0843 01/14/21 0445 01/15/21 0856  WBC 10.9* 10.6* 8.5 10.5 13.5*  HGB 10.0* 9.9* 9.6* 10.1* 10.6*  HCT 30.0* 30.1* 28.7* 29.7* 32.2*  MCV 85.5 86.0 86.2 85.8 87.0  PLT 256 298 237 269 833    Basic Metabolic Panel: Recent Labs  Lab 01/10/21 0500 01/10/21 1444 01/11/21 0422 01/11/21 0915 01/12/21 0436 01/13/21 0843 01/14/21 0445 01/15/21 0856  NA 136 137  --  136 136 137 137 136  K 5.9* 5.2*  --  4.8 4.2 4.2 4.5 4.1  CL 103 101  --  102 100 99 99 96*  CO2 19* 19*  --  20* '24 25 22 22  ' GLUCOSE 133* 157*  --  153* 162* 155* 156* 131*  BUN 67* 78*  --  71* 63* 59* 89* 72*  CREATININE 5.27* 6.14*  --  5.47* 5.05* 5.06* 6.62* 6.26*  CALCIUM 8.4* 8.5*  --  8.0* 8.3* 8.0* 7.9* 7.8*  MG 2.7*  --  2.4  --  2.4 2.5* 2.7* 2.5*  PHOS 9.4* 9.1* 7.2* 8.2* 7.2*  --   --   --     GFR: Estimated Creatinine Clearance: 7.9 mL/min (A) (by C-G formula based on SCr of 6.26 mg/dL (H)). Liver Function Tests: Recent Labs  Lab 01/10/21 1444 01/11/21 0915 01/12/21 0436 01/13/21 0843  AST  --   --  44* 31  ALT  --   --  69* 55*  ALKPHOS  --   --  83 83  BILITOT  --   --  1.0 0.7  PROT  --   --  6.7 6.0*  ALBUMIN 2.7* 2.4* 2.6* 2.4*    No results for input(s): LIPASE, AMYLASE in the last 168 hours. No results for input(s): AMMONIA in the last 168  hours. Coagulation Profile: No results for input(s): INR, PROTIME in the last 168 hours. Cardiac Enzymes: No results for input(s): CKTOTAL, CKMB, CKMBINDEX, TROPONINI in the last 168 hours. BNP (last 3 results) No results for input(s): PROBNP in the last 8760 hours. HbA1C: No results for input(s): HGBA1C in the last 72 hours. CBG: Recent Labs  Lab 01/15/21 0018 01/15/21 0807 01/15/21 1234 01/15/21 1553 01/15/21 1958  GLUCAP 143* 124* 156* 125* 126*    Lipid Profile: No results for input(s): CHOL, HDL, LDLCALC, TRIG, CHOLHDL, LDLDIRECT in the last 72 hours. Thyroid Function Tests: No results for input(s): TSH, T4TOTAL, FREET4, T3FREE, THYROIDAB in the last 72 hours. Anemia Panel: No results for input(s): VITAMINB12, FOLATE, FERRITIN, TIBC, IRON, RETICCTPCT in the last 72 hours.  Urine analysis:    Component Value Date/Time   COLORURINE YELLOW (A) 01/09/2021 0952   APPEARANCEUR CLOUDY (A) 01/09/2021 0952   LABSPEC 1.023 01/09/2021 [place]  5.0 01/09/2021 0952   GLUCOSEU 50 (A) 01/09/2021 0952   HGBUR MODERATE (A) 01/09/2021 0952   BILIRUBINUR NEGATIVE 01/09/2021 0952   KETONESUR 5 (A) 01/09/2021 0952   PROTEINUR 30 (A) 01/09/2021 0952   NITRITE NEGATIVE 01/09/2021 0952   LEUKOCYTESUR NEGATIVE 01/09/2021 0952   Sepsis Labs: '@LABRCNTIP' (procalcitonin:4,lacticidven:4)  ) Recent Results (from the past 240 hour(s))  MRSA Next Gen by PCR, Nasal     Status: None   Collection Time: 01/09/21  9:50 AM   Specimen: Nasal Mucosa; Nasal Swab  Result Value Ref Range Status   MRSA by PCR Next Gen NOT DETECTED NOT DETECTED Final    Comment: (NOTE) The GeneXpert MRSA Assay (FDA approved for NASAL specimens only), is one component of a comprehensive MRSA colonization surveillance program. It is not intended to diagnose MRSA infection nor to guide or monitor treatment for MRSA infections. Test performance is not FDA approved in patients less than 73 years old. Performed at  [facility], [address] [place], [place] [address]   Resp Panel by RT-PCR (Flu A&B, Covid) Nasopharyngeal Swab     Status: Abnormal   Collection Time: 01/10/21 10:46 PM   Specimen: Nasopharyngeal Swab; Nasopharyngeal(NP) swabs in vial transport medium  Result Value Ref Range Status   SARS Coronavirus 2 by RT PCR POSITIVE (A) NEGATIVE Final    Comment: RESULT CALLED TO, READ BACK BY AND VERIFIED WITH: ERICKA MCMILLIAN @ 0025 01/11/21 [facility] (NOTE) SARS-CoV-2 target nucleic acids are DETECTED.  The SARS-CoV-2 RNA is generally detectable in upper respiratory specimens during the acute phase of infection. Positive results are indicative of the presence of the identified virus, but do not rule out bacterial infection or co-infection with other pathogens not detected by the test. Clinical correlation with patient history and other diagnostic information is necessary to determine patient infection status. The expected result is Negative.  Fact Sheet for Patients: EntrepreneurPulse.com.au  Fact Sheet for Healthcare Providers: IncredibleEmployment.be  This test is not yet approved or cleared by the [place] FDA and  has been authorized for detection and/or diagnosis of SARS-CoV-2 by FDA under an Emergency Use Authorization (EUA).  This EUA will remain in effect (meaning this test can  be used) for the duration of  the COVID-19 declaration under Section 564(b)(1) of the Act, 21 U.S.C. section 360bbb-3(b)(1), unless the authorization is terminated or revoked sooner.     Influenza A by PCR NEGATIVE NEGATIVE Final   Influenza B by PCR NEGATIVE NEGATIVE Final    Comment: (NOTE) The Xpert Xpress SARS-CoV-2/FLU/RSV plus assay is intended as an aid in the diagnosis of influenza from Nasopharyngeal swab specimens and should not be used as a sole basis for treatment. Nasal washings and aspirates are unacceptable for Xpert Xpress  SARS-CoV-2/FLU/RSV testing.  Fact Sheet for Patients: EntrepreneurPulse.com.au  Fact Sheet for Healthcare Providers: IncredibleEmployment.be  This test is not yet approved or cleared by the [place] FDA and has been authorized for detection and/or diagnosis of SARS-CoV-2 by FDA under an Emergency Use Authorization (EUA). This EUA will remain in effect (meaning this test can be used) for the duration of the COVID-19 declaration under Section 564(b)(1) of the Act, 21 U.S.C. section 360bbb-3(b)(1), unless the authorization is terminated or revoked.  Performed at [facility], [address], [place], [place] [address]       Studies: No results found.  Scheduled Meds:  vitamin C  500 mg Oral Daily   aspirin EC  81 mg Oral Daily  Chlorhexidine Gluconate Cloth  6 each Topical Daily   cholecalciferol  1,000 Units Oral Daily   feeding supplement (NEPRO CARB STEADY)  237 mL Oral TID BM   ferrous sulfate  325 mg Oral Q breakfast   heparin injection (subcutaneous)  5,000 Units Subcutaneous Q8H   ipratropium  2 puff Inhalation TID   levalbuterol  2 puff Inhalation TID   multivitamin  1 tablet Oral QHS   multivitamin with minerals  1 tablet Oral Daily   omega-3 acid ethyl esters  1 g Oral Daily   pantoprazole  40 mg Oral QHS   polyethylene glycol  17 g Oral BID   predniSONE  50 mg Oral Daily   rosuvastatin  10 mg Oral QHS   zinc sulfate  220 mg Oral Daily    Continuous Infusions:  sodium chloride Stopped (01/15/21 0445)   sodium chloride     sodium chloride     ampicillin (OMNIPEN) IV Stopped (01/15/21 1825)     LOS: 13 days     Annita Brod, MD [facility]   01/16/2021, 9:21 AM

## 2021-01-16 NOTE — Progress Notes (Signed)
Patient with 3-hour HD session, completes without incident. Right femoral catheter functioned fair, unable to maintain the prescribed BFR, lines reversed, BFR at prescribed rate.Nephrologist made aware, in suite. CVC shows no signs of infection.  Venous chamber on dialyzer with clot, may need to consider heparinization prior to treatment.  No medication given, no labs drawn. Patient silent, but arousal, answering questions appropriately. Patient return to floor, report given.

## 2021-01-17 DIAGNOSIS — J449 Chronic obstructive pulmonary disease, unspecified: Secondary | ICD-10-CM

## 2021-01-17 DIAGNOSIS — U071 COVID-19: Secondary | ICD-10-CM | POA: Diagnosis not present

## 2021-01-17 DIAGNOSIS — R778 Other specified abnormalities of plasma proteins: Secondary | ICD-10-CM

## 2021-01-17 DIAGNOSIS — Z66 Do not resuscitate: Secondary | ICD-10-CM

## 2021-01-17 DIAGNOSIS — N186 End stage renal disease: Secondary | ICD-10-CM | POA: Diagnosis not present

## 2021-01-17 DIAGNOSIS — J9601 Acute respiratory failure with hypoxia: Secondary | ICD-10-CM | POA: Diagnosis not present

## 2021-01-17 DIAGNOSIS — Z515 Encounter for palliative care: Secondary | ICD-10-CM

## 2021-01-17 LAB — GLUCOSE, CAPILLARY
Glucose-Capillary: 104 mg/dL — ABNORMAL HIGH (ref 70–99)
Glucose-Capillary: 175 mg/dL — ABNORMAL HIGH (ref 70–99)
Glucose-Capillary: 201 mg/dL — ABNORMAL HIGH (ref 70–99)
Glucose-Capillary: 160 mg/dL — ABNORMAL HIGH (ref 70–99)

## 2021-01-17 LAB — BASIC METABOLIC PANEL
Anion gap: 12 (ref 5–15)
BUN: 59 mg/dL — ABNORMAL HIGH (ref 8–23)
CO2: 26 mmol/L (ref 22–32)
Calcium: 7.8 mg/dL — ABNORMAL LOW (ref 8.9–10.3)
Chloride: 98 mmol/L (ref 98–111)
Creatinine, Ser: 5.19 mg/dL — ABNORMAL HIGH (ref 0.61–1.24)
GFR, Estimated: 10 mL/min — ABNORMAL LOW (ref >60)
Glucose, Bld: 107 mg/dL — ABNORMAL HIGH (ref 70–99)
Potassium: 3.8 mmol/L (ref 3.5–5.1)
Sodium: 136 mmol/L (ref 135–145)

## 2021-01-17 LAB — CBC
HCT: 34.5 % — ABNORMAL LOW (ref 39.0–52.0)
Hemoglobin: 11.5 g/dL — ABNORMAL LOW (ref 13.0–17.0)
MCH: 28.3 pg (ref 26.0–34.0)
MCHC: 33.3 g/dL (ref 30.0–36.0)
MCV: 85 fL (ref 80.0–100.0)
Platelets: 209 10*3/uL (ref 150–400)
RBC: 4.06 MIL/uL — ABNORMAL LOW (ref 4.22–5.81)
RDW: 15.9 % — ABNORMAL HIGH (ref 11.5–15.5)
WBC: 13.2 10*3/uL — ABNORMAL HIGH (ref 4.0–10.5)
nRBC: 0 % (ref 0.0–0.2)

## 2021-01-17 LAB — MAGNESIUM: Magnesium: 2.2 mg/dL (ref 1.7–2.4)

## 2021-01-17 MED ORDER — MORPHINE SULFATE (PF) 2 MG/ML IV SOLN
1.0000 mg | Freq: Once | INTRAVENOUS | Status: DC
Start: 2021-01-17 — End: 2021-01-17

## 2021-01-17 NOTE — Progress Notes (Signed)
[facility]  PROGRESS NOTE   Subjective:   Hemodialysis treatment yesterday. Tolerated treatment well. UF of 1.9 liters.   Patient states he wants to continue to do dialysis if it will help him live.   Furosemide with little urine response  Objective:  Vital signs in last 24 hours:  Temp:  [97.7 F (36.5 C)-98.3 F (36.8 C)] 97.7 F (36.5 C) (11/19 0902) Pulse Rate:  [64-83] 72 (11/19 0902) Resp:  [12-21] 12 (11/19 0902) BP: (116-155)/(28-47) 137/33 (11/19 0902) SpO2:  [93 %-98 %] 98 % (11/19 0902) Weight:  [70.8 kg] 70.8 kg (11/19 0337)  Weight change: -2.2 kg Filed Weights   01/14/21 1208 01/16/21 0500 01/17/21 0337  Weight: 69.8 kg 73 kg 70.8 kg    Intake/Output: I/O last 3 completed shifts: In: 169.5 [I.V.:69.5; IV Piggyback:100] Out: 1900 [Other:1900]   Intake/Output this shift:  Total I/O In: 60 [P.O.:60] Out: -   Physical Exam: General:  No acute distress, laying in bed  Head:  Normocephalic, atraumatic. Moist oral mucosal membranes  Eyes:  Anicteric  Neck:  Supple  Lungs:   Clear to auscultation, 5 L [place] O2.   Heart:  regular  Abdomen:   Soft, nontender, bowel sounds present  Extremities:  + peripheral edema.  Neurologic:  Awake and alert  Skin:  No lesions  Access: Right femoral temp HD catheter 50/27    Basic Metabolic Panel: Recent Labs  Lab 01/10/21 1444 01/10/21 1444 01/11/21 0422 01/11/21 0915 01/12/21 0436 01/13/21 0843 01/14/21 0445 01/15/21 0856 01/16/21 0849 01/17/21 0438  NA 137  --   --  136 136 137 137 136 137 136  K 5.2*  --   --  4.8 4.2 4.2 4.5 4.1 4.3 3.8  CL 101  --   --  102 100 99 99 96* 104 98  CO2 19*  --   --  20* 24 25 22 22 27 26   GLUCOSE 157*  --   --  153* 162* 155* 156* 131* 105* 107*  BUN 78*  --   --  71* 63* 59* 89* 72* 28* 59*  CREATININE 6.14*  --   --  5.47* 5.05* 5.06* 6.62* 6.26* 1.15 5.19*  CALCIUM 8.5*  --   --  8.0* 8.3* 8.0* 7.9* 7.8* 8.6* 7.8*  MG  --    < > 2.4  --  2.4 2.5* 2.7*  2.5* 2.1 2.2  PHOS 9.1*  --  7.2* 8.2* 7.2*  --   --   --   --   --    < > = values in this interval not displayed.     CBC: Recent Labs  Lab 01/13/21 0843 01/14/21 0445 01/15/21 0856 01/16/21 0849 01/17/21 0438  WBC 8.5 10.5 13.5* 4.4 13.2*  HGB 9.6* 10.1* 10.6* 10.3* 11.5*  HCT 28.7* 29.7* 32.2* 32.6* 34.5*  MCV 86.2 85.8 87.0 99.1 85.0  PLT 237 269 247 204 209      Urinalysis: No results for input(s): COLORURINE, LABSPEC, PHURINE, GLUCOSEU, HGBUR, BILIRUBINUR, KETONESUR, PROTEINUR, UROBILINOGEN, NITRITE, LEUKOCYTESUR in the last 72 hours.  Invalid input(s): APPERANCEUR     Imaging: No results found.   Medications:    sodium chloride 10 mL/hr at 01/17/21 0406   ampicillin (OMNIPEN) IV 2 g (01/17/21 0407)    vitamin C  500 mg Oral Daily   aspirin EC  81 mg Oral Daily   Chlorhexidine Gluconate Cloth  6 each Topical Daily   cholecalciferol  1,000 Units Oral  Daily   feeding supplement (NEPRO CARB STEADY)  237 mL Oral TID BM   ferrous sulfate  325 mg Oral Q breakfast   heparin injection (subcutaneous)  5,000 Units Subcutaneous Q8H   ipratropium  2 puff Inhalation TID   levalbuterol  2 puff Inhalation TID   multivitamin  1 tablet Oral QHS   multivitamin with minerals  1 tablet Oral Daily   omega-3 acid ethyl esters  1 g Oral Daily   pantoprazole  40 mg Oral QHS   polyethylene glycol  17 g Oral BID   predniSONE  50 mg Oral Daily   rosuvastatin  10 mg Oral QHS   zinc sulfate  220 mg Oral Daily    Assessment/ Plan:     Principal Problem:   Acute respiratory failure with hypoxia (HCC) Active Problems:   CAP (community acquired pneumonia)   Sepsis ([place])   COPD (chronic obstructive pulmonary disease) (HCC)   GERD (gastroesophageal reflux disease)   HTN (hypertension)   HLD (hyperlipidemia)   Elevated troponin   AKI (acute kidney injury) ([place])   Normocytic anemia   Aortic valve regurgitation   COVID-19 virus infection  [name] is a 80 y.o.  white male with hypertension, coronary artery disease, congestive heart failure, COPD, GERD, hyperlipidemia, admitted to [facility] on CAP (community acquired pneumonia) [J18.9] AKI (acute kidney injury) ([place]) [N17.9] Community acquired pneumonia, unspecified laterality [J18.9] Congestive heart failure, unspecified HF chronicity, unspecified heart failure type ([place]) [I50.9]    #1: Acute kidney injury with hyperkalemia: on chronic kidney disease stage IIIB, baseline 1.64, GFR of 42 on admission.  Requiring hemodialysis. Resistant to furosemide.  - Continue to monitor for dialysis need. Plan on MWF schedule, next treatment for Monday.  - Currently with no signs of renal recovery. Recommend vascular to place a tunneled dialysis catheter once patient is out of isolation.    #2. Hypertension: with chronic kidney disease.  137/33 - Continue metoprolol.   #3. Sepsis: E. Faecalis on blood cultures on 11/5.  - IV pip/tazo with end date of 11/19.  - Appreciate ID input.   #4. Acute respiratory failure with COVID-19 infection and pulmonary edema. Continues to have high oxygen requirements.   Appreciate palliative care input.    LOS: [place], [facility] 11/19/202210:36 AM

## 2021-01-17 NOTE — Progress Notes (Signed)
PROGRESS NOTE  ADOM SCHOENECK PIR:[id number] DOB: [birth year] DOA: 01/03/2021 PCP: [facility]  HPI/Recap of past 60 hours: 80 year old male with past medical history of hypertension, CAD, COPD and hyperlipidemia admitted on 11/5 for shortness of breath that have been going on for over a week prior.  Patient found to have sepsis on admission secondary to pneumonia with positive blood cultures for Enterococcus.  Complicating matters were that patient contracted COVID wife during hospitalization and developed worsening respiratory failure on 11/11 requiring BiPAP and placement in stepdown unit.  Found to also have moderate to severe aortic regurg with pulmonary edema.  Acute kidney injury worsened with attempted diuresis and patient started on temporary dialysis.  Patient also has issues with nightly sundowning.  Currently down to 5 L nasal cannula.  Palliative care consulted on 11/15.  Patient continues to get dialysis with nephrology considering permacath when he is off isolation.  By 11/17, patient frustrated, saying he wants to go home and refusing his medications.  Both palliative care and this attending followed up with patient who conveyed that he did not want to stop treatments, and that also he has full say over his care.  At times still worn out, but still willing to do dialysis.  Today, patient appears to be in better spirits in front of his family says he wants to continue with full treatments.  Assessment/Plan: Principal Problem:   Acute respiratory failure with hypoxia ([place]): Secondary to COVID infection, community-acquired pneumonia and volume overload from severe aortic regurgitation: Continue diuresis, Remdisivir.  Has completed IV antibiotics.  Still on 5 L nasal cannula, will try to wean down his oxygen echocardiogram noted preserved ejection fraction and no evidence of diastolic dysfunction.   Active Problems:  Enterococcus sepsis ([place])  Met criteria for sepsis  on admission given leukocytosis and heart rate greater than 90.  Appreciate infectious disease help.  Sepsis is since resolved.  Completes IV Zosyn today.  Enterococcus not really associated with pneumonia      GERD (gastroesophageal reflux disease): Continue PPI.    HTN (hypertension): Holding Cozaar and metoprolol due to low episodes of blood pressure.    HLD (hyperlipidemia): Continue statin.    Elevated troponin: Felt to be demand ischemia, likely from sepsis/poor valvular function.   AKI (acute kidney injury) ([place]) in the setting of stage IIIb chronic kidney disease: Appreciate nephrology help.  Considering PermCath when he is off isolation, which will be after 11/21.  Nephrology has reached out to vascular surgery.  Creatinine still staying around 6.    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  Continue to follow.   Aortic valve regurgitation   COVID-19 virus infection: Completed IV Remdisivir.   Code Status: DNR  Family Communication: Wife and son at the bedside  Disposition Plan: Currently on MedSurg.  Possible permacath when he is off of COVID isolation and then outpatient dialysis   Consultants: Palliative care Nephrology Vascular surgery  Procedures: 2D echo: Preserved ejection fraction, severe moderate to severe aortic regurg, no evidence of diastolic dysfunction TEE: No evidence of endocarditis Continued hemodialysis  Antimicrobials: IV cefepime and vancomycin 11/5-11/11 IV Zosyn 11/11-11/19 IV Remdisivir 11/14-11/18  DVT prophylaxis: Subcu heparin  Level of care: Med-Surg   Objective: Vitals:   01/17/21 0333 01/17/21 0902  BP: (!) 116/33 (!) 137/33  Pulse: 70 72  Resp: 16 12  Temp: 98.3 F (36.8 C) 97.7 F (36.5 C)  SpO2: 95% 98%    Intake/Output Summary (Last 24 hours)  at 01/17/2021 1038 Last data filed at 01/17/2021 0800 Gross per 24 hour  Intake 60 ml  Output 1900 ml  Net -1840 ml    Filed Weights   01/14/21 1208 01/16/21  0500 01/17/21 0337  Weight: 69.8 kg 73 kg 70.8 kg   Body mass index is 25.97 kg/m.  Exam:  General: Alert and oriented x2, no acute distress HEENT: Normocephalic and atraumatic, mucous membranes are Cardiovascular: Regular rate and rhythm, S1-S2 Respiratory: decreased breath sounds throughout, some crackles at bases Abdomen: Soft, nontender, nondistended, positive bowel sounds Musculoskeletal: No clubbing or cyanosis, 2+ pitting edema Psychiatry: Appropriate, no evidence of psychoses   Data Reviewed: CBC: Recent Labs  Lab 01/13/21 0843 01/14/21 0445 01/15/21 0856 01/16/21 0849 01/17/21 0438  WBC 8.5 10.5 13.5* 4.4 13.2*  HGB 9.6* 10.1* 10.6* 10.3* 11.5*  HCT 28.7* 29.7* 32.2* 32.6* 34.5*  MCV 86.2 85.8 87.0 99.1 85.0  PLT 237 269 247 204 841    Basic Metabolic Panel: Recent Labs  Lab 01/10/21 1444 01/10/21 1444 01/11/21 0422 01/11/21 0915 01/12/21 0436 01/13/21 0843 01/14/21 0445 01/15/21 0856 01/16/21 0849 01/17/21 0438  NA 137  --   --  136 136 137 137 136 137 136  K 5.2*  --   --  4.8 4.2 4.2 4.5 4.1 4.3 3.8  CL 101  --   --  102 100 99 99 96* 104 98  CO2 19*  --   --  20* '24 25 22 22 27 26  ' GLUCOSE 157*  --   --  153* 162* 155* 156* 131* 105* 107*  BUN 78*  --   --  71* 63* 59* 89* 72* 28* 59*  CREATININE 6.14*  --   --  5.47* 5.05* 5.06* 6.62* 6.26* 1.15 5.19*  CALCIUM 8.5*  --   --  8.0* 8.3* 8.0* 7.9* 7.8* 8.6* 7.8*  MG  --    < > 2.4  --  2.4 2.5* 2.7* 2.5* 2.1 2.2  PHOS 9.1*  --  7.2* 8.2* 7.2*  --   --   --   --   --    < > = values in this interval not displayed.    GFR: Estimated Creatinine Clearance: 9.5 mL/min (A) (by C-G formula based on SCr of 5.19 mg/dL (H)). Liver Function Tests: Recent Labs  Lab 01/10/21 1444 01/11/21 0915 01/12/21 0436 01/13/21 0843  AST  --   --  44* 31  ALT  --   --  69* 55*  ALKPHOS  --   --  83 83  BILITOT  --   --  1.0 0.7  PROT  --   --  6.7 6.0*  ALBUMIN 2.7* 2.4* 2.6* 2.4*    No results for  input(s): LIPASE, AMYLASE in the last 168 hours. No results for input(s): AMMONIA in the last 168 hours. Coagulation Profile: No results for input(s): INR, PROTIME in the last 168 hours. Cardiac Enzymes: No results for input(s): CKTOTAL, CKMB, CKMBINDEX, TROPONINI in the last 168 hours. BNP (last 3 results) No results for input(s): PROBNP in the last 8760 hours. HbA1C: No results for input(s): HGBA1C in the last 72 hours. CBG: Recent Labs  Lab 01/15/21 0807 01/15/21 1234 01/15/21 1553 01/15/21 1958 01/17/21 0953  GLUCAP 124* 156* 125* 126* 104*    Lipid Profile: No results for input(s): CHOL, HDL, LDLCALC, TRIG, CHOLHDL, LDLDIRECT in the last 72 hours. Thyroid Function Tests: No results for input(s): TSH, T4TOTAL, FREET4, T3FREE, THYROIDAB in the  last 72 hours. Anemia Panel: No results for input(s): VITAMINB12, FOLATE, FERRITIN, TIBC, IRON, RETICCTPCT in the last 72 hours.  Urine analysis:    Component Value Date/Time   COLORURINE YELLOW (A) 01/09/2021 0952   APPEARANCEUR CLOUDY (A) 01/09/2021 0952   LABSPEC 1.023 01/09/2021 0952   PHURINE 5.0 01/09/2021 0952   GLUCOSEU 50 (A) 01/09/2021 0952   HGBUR MODERATE (A) 01/09/2021 0952   BILIRUBINUR NEGATIVE 01/09/2021 0952   KETONESUR 5 (A) 01/09/2021 0952   PROTEINUR 30 (A) 01/09/2021 0952   NITRITE NEGATIVE 01/09/2021 0952   LEUKOCYTESUR NEGATIVE 01/09/2021 0952   Sepsis Labs: '@LABRCNTIP' (procalcitonin:4,lacticidven:4)  ) Recent Results (from the past 240 hour(s))  MRSA Next Gen by PCR, Nasal     Status: None   Collection Time: 01/09/21  9:50 AM   Specimen: Nasal Mucosa; Nasal Swab  Result Value Ref Range Status   MRSA by PCR Next Gen NOT DETECTED NOT DETECTED Final    Comment: (NOTE) The GeneXpert MRSA Assay (FDA approved for NASAL specimens only), is one component of a comprehensive MRSA colonization surveillance program. It is not intended to diagnose MRSA infection nor to guide or monitor treatment for MRSA  infections. Test performance is not FDA approved in patients less than 54 years old. Performed at [facility], ., [place], [place] [address]   Resp Panel by RT-PCR (Flu A&B, Covid) Nasopharyngeal Swab     Status: Abnormal   Collection Time: 01/10/21 10:46 PM   Specimen: Nasopharyngeal Swab; Nasopharyngeal(NP) swabs in vial transport medium  Result Value Ref Range Status   SARS Coronavirus 2 by RT PCR POSITIVE (A) NEGATIVE Final    Comment: RESULT CALLED TO, READ BACK BY AND VERIFIED WITH: ERICKA MCMILLIAN @ 0025 01/11/21 LFD (NOTE) SARS-CoV-2 target nucleic acids are DETECTED.  The SARS-CoV-2 RNA is generally detectable in upper respiratory specimens during the acute phase of infection. Positive results are indicative of the presence of the identified virus, but do not rule out bacterial infection or co-infection with other pathogens not detected by the test. Clinical correlation with patient history and other diagnostic information is necessary to determine patient infection status. The expected result is Negative.  Fact Sheet for Patients: EntrepreneurPulse.com.au  Fact Sheet for Healthcare Providers: IncredibleEmployment.be  This test is not yet approved or cleared by the [place] FDA and  has been authorized for detection and/or diagnosis of SARS-CoV-2 by FDA under an Emergency Use Authorization (EUA).  This EUA will remain in effect (meaning this test can  be used) for the duration of  the COVID-19 declaration under Section 564(b)(1) of the Act, 21 U.S.C. section 360bbb-3(b)(1), unless the authorization is terminated or revoked sooner.     Influenza A by PCR NEGATIVE NEGATIVE Final   Influenza B by PCR NEGATIVE NEGATIVE Final    Comment: (NOTE) The Xpert Xpress SARS-CoV-2/FLU/RSV plus assay is intended as an aid in the diagnosis of influenza from Nasopharyngeal swab specimens and should not be used as a  sole basis for treatment. Nasal washings and aspirates are unacceptable for Xpert Xpress SARS-CoV-2/FLU/RSV testing.  Fact Sheet for Patients: EntrepreneurPulse.com.au  Fact Sheet for Healthcare Providers: IncredibleEmployment.be  This test is not yet approved or cleared by the [place] FDA and has been authorized for detection and/or diagnosis of SARS-CoV-2 by FDA under an Emergency Use Authorization (EUA). This EUA will remain in effect (meaning this test can be used) for the duration of the COVID-19 declaration under Section 564(b)(1) of the Act, 21 U.S.C.  section 360bbb-3(b)(1), unless the authorization is terminated or revoked.  Performed at [facility], [address], [place], [place] [address]       Studies: No results found.  Scheduled Meds:  vitamin C  500 mg Oral Daily   aspirin EC  81 mg Oral Daily   Chlorhexidine Gluconate Cloth  6 each Topical Daily   cholecalciferol  1,000 Units Oral Daily   feeding supplement (NEPRO CARB STEADY)  237 mL Oral TID BM   ferrous sulfate  325 mg Oral Q breakfast   heparin injection (subcutaneous)  5,000 Units Subcutaneous Q8H   ipratropium  2 puff Inhalation TID   levalbuterol  2 puff Inhalation TID   multivitamin  1 tablet Oral QHS   multivitamin with minerals  1 tablet Oral Daily   omega-3 acid ethyl esters  1 g Oral Daily   pantoprazole  40 mg Oral QHS   polyethylene glycol  17 g Oral BID   predniSONE  50 mg Oral Daily   rosuvastatin  10 mg Oral QHS   zinc sulfate  220 mg Oral Daily    Continuous Infusions:  sodium chloride 10 mL/hr at 01/17/21 0406   ampicillin (OMNIPEN) IV 2 g (01/17/21 0407)     LOS: 14 days     Annita Brod, MD [facility]   01/17/2021, 10:38 AM

## 2021-01-17 NOTE — Progress Notes (Addendum)
Patient complaining of 8/10 chest pain, pressure. Tylenol administered and NP Randol Kern notified. NP put orders for EKG, done and no further orders ordered. Patient pain level came down to a 2/10 and is resting well. Has been compliant with taking medicine and other care this shift.

## 2021-01-18 DIAGNOSIS — J449 Chronic obstructive pulmonary disease, unspecified: Secondary | ICD-10-CM | POA: Diagnosis not present

## 2021-01-18 DIAGNOSIS — N186 End stage renal disease: Secondary | ICD-10-CM | POA: Diagnosis not present

## 2021-01-18 DIAGNOSIS — J9601 Acute respiratory failure with hypoxia: Secondary | ICD-10-CM | POA: Diagnosis not present

## 2021-01-18 DIAGNOSIS — U071 COVID-19: Secondary | ICD-10-CM | POA: Diagnosis not present

## 2021-01-18 LAB — BASIC METABOLIC PANEL
Anion gap: 15 (ref 5–15)
BUN: 100 mg/dL — ABNORMAL HIGH (ref 8–23)
CO2: 24 mmol/L (ref 22–32)
Calcium: 7.9 mg/dL — ABNORMAL LOW (ref 8.9–10.3)
Chloride: 96 mmol/L — ABNORMAL LOW (ref 98–111)
Creatinine, Ser: 7.05 mg/dL — ABNORMAL HIGH (ref 0.61–1.24)
GFR, Estimated: 7 mL/min — ABNORMAL LOW (ref >60)
Glucose, Bld: 123 mg/dL — ABNORMAL HIGH (ref 70–99)
Potassium: 4.3 mmol/L (ref 3.5–5.1)
Sodium: 135 mmol/L (ref 135–145)

## 2021-01-18 LAB — MAGNESIUM: Magnesium: 2.4 mg/dL (ref 1.7–2.4)

## 2021-01-18 LAB — POTASSIUM: Potassium: 4.3 mmol/L (ref 3.5–5.1)

## 2021-01-18 NOTE — Progress Notes (Signed)
[facility]  PROGRESS NOTE   Subjective:   Patient states he is starting to feel better.   UOP 253mL with foley catheter.   Creatinine 7.05 (5.19)  Objective:  Vital signs in last 24 hours:  Temp:  [97.4 F (36.3 C)-98.7 F (37.1 C)] 98.7 F (37.1 C) (11/20 0817) Pulse Rate:  [66-84] 74 (11/20 0817) Resp:  [18-22] 18 (11/20 0817) BP: (128-144)/(36-43) 137/37 (11/20 0817) SpO2:  [90 %-98 %] 98 % (11/20 0817)  Weight change:  Filed Weights   01/14/21 1208 01/16/21 0500 01/17/21 0337  Weight: 69.8 kg 73 kg 70.8 kg    Intake/Output: I/O last 3 completed shifts: In: 1002.7 [P.O.:780; I.V.:22.7; IV Piggyback:200] Out: 200 [Urine:200]   Intake/Output this shift:  Total I/O In: 240 [P.O.:240] Out: -   Physical Exam: General:  No acute distress, laying in bed  Head:  Normocephalic, atraumatic. Moist oral mucosal membranes  Eyes:  Anicteric  Neck:  Supple  Lungs:   Clear to auscultation, 3 L [place] O2.   Heart:  regular  Abdomen:   Soft, nontender, bowel sounds present  Extremities:  + peripheral edema.  Neurologic:  Awake and alert  Skin:  No lesions  Access: Right femoral temp HD catheter 42/59    Basic Metabolic Panel: Recent Labs  Lab 01/12/21 0436 01/13/21 0843 01/14/21 0445 01/15/21 0856 01/16/21 0849 01/17/21 0438 01/18/21 0441  NA 136   < > 137 136 137 136 135  K 4.2   < > 4.5 4.1 4.3 3.8 4.3  4.3  CL 100   < > 99 96* 104 98 96*  CO2 24   < > 22 22 27 26 24   GLUCOSE 162*   < > 156* 131* 105* 107* 123*  BUN 63*   < > 89* 72* 28* 59* 100*  CREATININE 5.05*   < > 6.62* 6.26* 1.15 5.19* 7.05*  CALCIUM 8.3*   < > 7.9* 7.8* 8.6* 7.8* 7.9*  MG 2.4   < > 2.7* 2.5* 2.1 2.2 2.4  PHOS 7.2*  --   --   --   --   --   --    < > = values in this interval not displayed.     CBC: Recent Labs  Lab 01/13/21 0843 01/14/21 0445 01/15/21 0856 01/16/21 0849 01/17/21 0438  WBC 8.5 10.5 13.5* 4.4 13.2*  HGB 9.6* 10.1* 10.6* 10.3* 11.5*  HCT 28.7*  29.7* 32.2* 32.6* 34.5*  MCV 86.2 85.8 87.0 99.1 85.0  PLT 237 269 247 204 209      Urinalysis: No results for input(s): COLORURINE, LABSPEC, PHURINE, GLUCOSEU, HGBUR, BILIRUBINUR, KETONESUR, PROTEINUR, UROBILINOGEN, NITRITE, LEUKOCYTESUR in the last 72 hours.  Invalid input(s): APPERANCEUR     Imaging: No results found.   Medications:    sodium chloride 10 mL/hr at 01/17/21 0406   ampicillin (OMNIPEN) IV 2 g (01/18/21 0604)    vitamin C  500 mg Oral Daily   aspirin EC  81 mg Oral Daily   Chlorhexidine Gluconate Cloth  6 each Topical Daily   cholecalciferol  1,000 Units Oral Daily   feeding supplement (NEPRO CARB STEADY)  237 mL Oral TID BM   ferrous sulfate  325 mg Oral Q breakfast   heparin injection (subcutaneous)  5,000 Units Subcutaneous Q8H   ipratropium  2 puff Inhalation TID   levalbuterol  2 puff Inhalation TID   multivitamin  1 tablet Oral QHS   multivitamin with minerals  1 tablet Oral Daily  omega-3 acid ethyl esters  1 g Oral Daily   pantoprazole  40 mg Oral QHS   polyethylene glycol  17 g Oral BID   predniSONE  50 mg Oral Daily   rosuvastatin  10 mg Oral QHS   zinc sulfate  220 mg Oral Daily    Assessment/ Plan:     Principal Problem:   Acute respiratory failure with hypoxia ([facility]) Active Problems:   CAP (community acquired pneumonia)   Sepsis ([place])   COPD (chronic obstructive pulmonary disease) ([facility])   GERD (gastroesophageal reflux disease)   HTN (hypertension)   HLD (hyperlipidemia)   Elevated troponin   AKI (acute kidney injury) ([place])   Normocytic anemia   Aortic valve regurgitation   COVID-19 virus infection  [name] is a 80 y.o. white male with hypertension, coronary artery disease, congestive heart failure, COPD, GERD, hyperlipidemia, admitted to [facility] on CAP (community acquired pneumonia) [J18.9] AKI (acute kidney injury) ([place]) [N17.9] Community acquired pneumonia, unspecified laterality [J18.9] Congestive heart failure,  unspecified HF chronicity, unspecified heart failure type ([place]) [I50.9]    #1: Acute kidney injury with hyperkalemia: on chronic kidney disease stage IIIB, baseline 1.64, GFR of 42 on admission.  Requiring hemodialysis. Resistant to furosemide.  Last hemodialysis treatment was Friday, 11/18 - Continue to monitor for dialysis need. Plan on MWF schedule, next treatment for Monday. Orders prepared.  - Currently with no signs of renal recovery. Recommend vascular to place a tunneled dialysis catheter once patient is out of isolation (11/21)   #2. Hypertension: with chronic kidney disease.  137/37 - Continue metoprolol.   #3. Sepsis: E. Faecalis on blood cultures on 11/5. Continue course of IV pip/tazo.   - Appreciate ID input.   #4. Acute respiratory failure with COVID-19 infection and pulmonary edema. Oxygen requirements have improved, weaned to 3 L [place] O2 this morning.   #5. Anemia with chronic kidney disease: hemoglobin 11.5. No indication for ESA.   Appreciate palliative care input.    LOS: [place], [facility] 11/20/20221:38 PM

## 2021-01-18 NOTE — Progress Notes (Signed)
Received a call from Tele around 0600 that had irregular HR. Sated that pt had SR with wide QRS. Pt  was alert and oriented. VS taken and within pt normal limit. On call physician notified,  order received  to add  potassium and magnesium to pt morning lab.

## 2021-01-18 NOTE — Progress Notes (Signed)
PROGRESS NOTE  Phillip Chavez:[id number] DOB: 1940-05-17 DOA: 01/03/2021 PCP: [facility]  HPI/Recap of past 52 hours: 80 year old male with past medical history of hypertension, CAD, COPD and hyperlipidemia admitted on 11/5 for shortness of breath that have been going on for over a week prior.  Patient found to have sepsis on admission secondary to pneumonia with positive blood cultures for Enterococcus.  Complicating matters were that patient contracted COVID wife during hospitalization and developed worsening respiratory failure on 11/11 requiring BiPAP and placement in stepdown unit.  Found to also have moderate to severe aortic regurg with pulmonary edema.  Acute kidney injury worsened with attempted diuresis and patient started on temporary dialysis.  Patient also has issues with nightly sundowning.  Currently down to 5 L nasal cannula.  Palliative care consulted on 11/15.  Patient continues to get dialysis with nephrology considering permacath when he is off isolation.  By 11/17, patient frustrated, saying he wants to go home and refusing his medications.  Both palliative care and this attending followed up with patient who conveyed that he did not want to stop treatments, and that also he has full say over his care.  At times still worn out, but still willing to do dialysis.  Patient resting comfortably today.  No complaints  Assessment/Plan: Principal Problem:   Acute respiratory failure with hypoxia ([place]): Secondary to COVID infection, community-acquired pneumonia and volume overload from severe aortic regurgitation: Continue diuresis, Remdisivir.  Has completed IV antibiotics.  Have been able to wean down oxygen from 5 L down to 2 L.   Active Problems:  Enterococcus sepsis ([place])  Met criteria for sepsis on admission given leukocytosis and heart rate greater than 90.  Appreciate infectious disease help.  Sepsis is since resolved.  Completed IV Zosyn course.   Enterococcus not felt to be associated with his pneumonia.  We will reach out to vascular surgery to see if they can      GERD (gastroesophageal reflux disease): Continue PPI.    HTN (hypertension): Holding Cozaar and metoprolol due to low episodes of blood pressure.    HLD (hyperlipidemia): Continue statin.    Elevated troponin: Felt to be demand ischemia, likely from sepsis/poor valvular function.   AKI (acute kidney injury) ([place]) in the setting of stage IIIb chronic kidney disease: Appreciate nephrology help.  Considering PermCath when he is off isolation, which will be after 11/21.  Will reach out to vascular surgery to see if they can take him on Tuesday or Wednesday.  Creatinine still staying around 6.    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  Continue to follow.   Aortic valve regurgitation   COVID-19 virus infection: Completed IV Remdisivir.   Code Status: DNR  Family Communication: Wife and son at the bedside  Disposition Plan: Currently on MedSurg.  Possible permacath when he is off of COVID isolation and then outpatient dialysis   Consultants: Palliative care Nephrology Vascular surgery  Procedures: 2D echo: Preserved ejection fraction, severe moderate to severe aortic regurg, no evidence of diastolic dysfunction TEE: No evidence of endocarditis Continued hemodialysis  Antimicrobials: IV cefepime and vancomycin 11/5-11/11 IV Zosyn 11/11-11/19 IV Remdisivir 11/14-11/18  DVT prophylaxis: Subcu heparin  Level of care: Med-Surg   Objective: Vitals:   01/18/21 0613 01/18/21 0817  BP: (!) 131/43 (!) 137/37  Pulse: 66 74  Resp: 20 18  Temp: 97.8 F (36.6 C) 98.7 F (37.1 C)  SpO2: 97% 98%    Intake/Output Summary (Last  24 hours) at 01/18/2021 1020 Last data filed at 01/18/2021 0800 Gross per 24 hour  Intake 822.65 ml  Output 200 ml  Net 622.65 ml    Filed Weights   01/14/21 1208 01/16/21 0500 01/17/21 0337  Weight: 69.8 kg 73 kg  70.8 kg   Body mass index is 25.97 kg/m.  Exam:  General: Alert and oriented x2, no acute distress, resting comfortably HEENT: Normocephalic and atraumatic, mucous membranes are Cardiovascular: Regular rate and rhythm, S1-S2 Respiratory: decreased breath sounds throughout Abdomen: Soft, nontender, nondistended, hypoactive bowel sounds Musculoskeletal: No clubbing or cyanosis, 2+ pitting edema Psychiatry: Appropriate, no evidence of psychoses   Data Reviewed: CBC: Recent Labs  Lab 01/13/21 0843 01/14/21 0445 01/15/21 0856 01/16/21 0849 01/17/21 0438  WBC 8.5 10.5 13.5* 4.4 13.2*  HGB 9.6* 10.1* 10.6* 10.3* 11.5*  HCT 28.7* 29.7* 32.2* 32.6* 34.5*  MCV 86.2 85.8 87.0 99.1 85.0  PLT 237 269 247 204 638    Basic Metabolic Panel: Recent Labs  Lab 01/12/21 0436 01/13/21 0843 01/14/21 0445 01/15/21 0856 01/16/21 0849 01/17/21 0438 01/18/21 0441  NA 136   < > 137 136 137 136 135  K 4.2   < > 4.5 4.1 4.3 3.8 4.3  4.3  CL 100   < > 99 96* 104 98 96*  CO2 24   < > _0 GLUCOSE 162*   < > 156* 131* 105* 107* 123*  BUN 63*   < > 89* 72* 28* 59* 100*  CREATININE 5.05*   < > 6.62* 6.26* 1.15 5.19* 7.05*  CALCIUM 8.3*   < > 7.9* 7.8* 8.6* 7.8* 7.9*  MG 2.4   < > 2.7* 2.5* 2.1 2.2 2.4  PHOS 7.2*  --   --   --   --   --   --    < > = values in this interval not displayed.    GFR: Estimated Creatinine Clearance: 7 mL/min (A) (by C-G formula based on SCr of 7.05 mg/dL (H)). Liver Function Tests: Recent Labs  Lab 01/12/21 0436 01/13/21 0843  AST 44* 31  ALT 69* 55*  ALKPHOS 83 83  BILITOT 1.0 0.7  PROT 6.7 6.0*  ALBUMIN 2.6* 2.4*    No results for input(s): LIPASE, AMYLASE in the last 168 hours. No results for input(s): AMMONIA in the last 168 hours. Coagulation Profile: No results for input(s): INR, PROTIME in the last 168 hours. Cardiac Enzymes: No results for input(s): CKTOTAL, CKMB, CKMBINDEX, TROPONINI in the last 168 hours. BNP (last 3  results) No results for input(s): PROBNP in the last 8760 hours. HbA1C: No results for input(s): HGBA1C in the last 72 hours. CBG: Recent Labs  Lab 01/15/21 1958 01/17/21 0953 01/17/21 1242 01/17/21 1622 01/17/21 2049  GLUCAP 126* 104* 175* 201* 160*    Lipid Profile: No results for input(s): CHOL, HDL, LDLCALC, TRIG, CHOLHDL, LDLDIRECT in the last 72 hours. Thyroid Function Tests: No results for input(s): TSH, T4TOTAL, FREET4, T3FREE, THYROIDAB in the last 72 hours. Anemia Panel: No results for input(s): VITAMINB12, FOLATE, FERRITIN, TIBC, IRON, RETICCTPCT in the last 72 hours.  Urine analysis:    Component Value Date/Time   COLORURINE YELLOW (A) 01/09/2021 0952   APPEARANCEUR CLOUDY (A) 01/09/2021 0952   LABSPEC 1.023 01/09/2021 0952   PHURINE 5.0 01/09/2021 0952   GLUCOSEU 50 (A) 01/09/2021 0952   HGBUR MODERATE (A) 01/09/2021 [place] NEGATIVE 01/09/2021 0952   KETONESUR 5 (A) 01/09/2021 1771  PROTEINUR 30 (A) 01/09/2021 0952   NITRITE NEGATIVE 01/09/2021 0952   LEUKOCYTESUR NEGATIVE 01/09/2021 0952   Sepsis Labs: _0 (procalcitonin:4,lacticidven:4)  ) Recent Results (from the past 240 hour(s))  MRSA Next Gen by PCR, Nasal     Status: None   Collection Time: 01/09/21  9:50 AM   Specimen: Nasal Mucosa; Nasal Swab  Result Value Ref Range Status   MRSA by PCR Next Gen NOT DETECTED NOT DETECTED Final    Comment: (NOTE) The GeneXpert MRSA Assay (FDA approved for NASAL specimens only), is one component of a comprehensive MRSA colonization surveillance program. It is not intended to diagnose MRSA infection nor to guide or monitor treatment for MRSA infections. Test performance is not FDA approved in patients less than 48 years old. Performed at [facility], [address] [place], [place] [address]   Resp Panel by RT-PCR (Flu A&B, Covid) Nasopharyngeal Swab     Status: Abnormal   Collection Time: 01/10/21 10:46 PM   Specimen:  Nasopharyngeal Swab; Nasopharyngeal(NP) swabs in vial transport medium  Result Value Ref Range Status   SARS Coronavirus 2 by RT PCR POSITIVE (A) NEGATIVE Final    Comment: RESULT CALLED TO, READ BACK BY AND VERIFIED WITH: ERICKA MCMILLIAN @ 0025 01/11/21 [facility] (NOTE) SARS-CoV-2 target nucleic acids are DETECTED.  The SARS-CoV-2 RNA is generally detectable in upper respiratory specimens during the acute phase of infection. Positive results are indicative of the presence of the identified virus, but do not rule out bacterial infection or co-infection with other pathogens not detected by the test. Clinical correlation with patient history and other diagnostic information is necessary to determine patient infection status. The expected result is Negative.  Fact Sheet for Patients: EntrepreneurPulse.com.au  Fact Sheet for Healthcare Providers: IncredibleEmployment.be  This test is not yet approved or cleared by the [place] FDA and  has been authorized for detection and/or diagnosis of SARS-CoV-2 by FDA under an Emergency Use Authorization (EUA).  This EUA will remain in effect (meaning this test can  be used) for the duration of  the COVID-19 declaration under Section 564(b)(1) of the Act, 21 U.S.C. section 360bbb-3(b)(1), unless the authorization is terminated or revoked sooner.     Influenza A by PCR NEGATIVE NEGATIVE Final   Influenza B by PCR NEGATIVE NEGATIVE Final    Comment: (NOTE) The Xpert Xpress SARS-CoV-2/FLU/RSV plus assay is intended as an aid in the diagnosis of influenza from Nasopharyngeal swab specimens and should not be used as a sole basis for treatment. Nasal washings and aspirates are unacceptable for Xpert Xpress SARS-CoV-2/FLU/RSV testing.  Fact Sheet for Patients: EntrepreneurPulse.com.au  Fact Sheet for Healthcare Providers: IncredibleEmployment.be  This test is not yet  approved or cleared by the [place] FDA and has been authorized for detection and/or diagnosis of SARS-CoV-2 by FDA under an Emergency Use Authorization (EUA). This EUA will remain in effect (meaning this test can be used) for the duration of the COVID-19 declaration under Section 564(b)(1) of the Act, 21 U.S.C. section 360bbb-3(b)(1), unless the authorization is terminated or revoked.  Performed at [facility], [address], [place], [place] [address]       Studies: No results found.  Scheduled Meds:  vitamin C  500 mg Oral Daily   aspirin EC  81 mg Oral Daily   Chlorhexidine Gluconate Cloth  6 each Topical Daily   cholecalciferol  1,000 Units Oral Daily   feeding supplement (NEPRO CARB STEADY)  237 mL Oral TID BM  ferrous sulfate  325 mg Oral Q breakfast   heparin injection (subcutaneous)  5,000 Units Subcutaneous Q8H   ipratropium  2 puff Inhalation TID   levalbuterol  2 puff Inhalation TID   multivitamin  1 tablet Oral QHS   multivitamin with minerals  1 tablet Oral Daily   omega-3 acid ethyl esters  1 g Oral Daily   pantoprazole  40 mg Oral QHS   polyethylene glycol  17 g Oral BID   predniSONE  50 mg Oral Daily   rosuvastatin  10 mg Oral QHS   zinc sulfate  220 mg Oral Daily    Continuous Infusions:  sodium chloride 10 mL/hr at 01/17/21 0406   ampicillin (OMNIPEN) IV 2 g (01/18/21 0604)     LOS: 15 days     Annita Brod, MD [facility]   01/18/2021, 10:20 AM

## 2021-01-19 ENCOUNTER — Other Ambulatory Visit (INDEPENDENT_AMBULATORY_CARE_PROVIDER_SITE_OTHER): Payer: Self-pay | Admitting: Vascular Surgery

## 2021-01-19 DIAGNOSIS — N186 End stage renal disease: Secondary | ICD-10-CM | POA: Diagnosis not present

## 2021-01-19 DIAGNOSIS — J449 Chronic obstructive pulmonary disease, unspecified: Secondary | ICD-10-CM | POA: Diagnosis not present

## 2021-01-19 DIAGNOSIS — J9601 Acute respiratory failure with hypoxia: Secondary | ICD-10-CM | POA: Diagnosis not present

## 2021-01-19 DIAGNOSIS — U071 COVID-19: Secondary | ICD-10-CM | POA: Diagnosis not present

## 2021-01-19 LAB — BASIC METABOLIC PANEL
Anion gap: 15 (ref 5–15)
BUN: 117 mg/dL — ABNORMAL HIGH (ref 8–23)
CO2: 22 mmol/L (ref 22–32)
Calcium: 7.8 mg/dL — ABNORMAL LOW (ref 8.9–10.3)
Chloride: 96 mmol/L — ABNORMAL LOW (ref 98–111)
Creatinine, Ser: 7.86 mg/dL — ABNORMAL HIGH (ref 0.61–1.24)
GFR, Estimated: 6 mL/min — ABNORMAL LOW (ref >60)
Glucose, Bld: 115 mg/dL — ABNORMAL HIGH (ref 70–99)
Potassium: 4.7 mmol/L (ref 3.5–5.1)
Sodium: 133 mmol/L — ABNORMAL LOW (ref 135–145)

## 2021-01-19 MED ORDER — EPOETIN ALFA 4000 UNIT/ML IJ SOLN
INTRAMUSCULAR | Status: AC
  Filled 2021-01-19: qty 1

## 2021-01-19 MED ORDER — HEPARIN SODIUM (PORCINE) 1000 UNIT/ML IJ SOLN
INTRAMUSCULAR | Status: AC
  Filled 2021-01-19: qty 1

## 2021-01-19 NOTE — Consult Note (Signed)
[facility] Vascular Consult Note  MRN : [id number]  Phillip Chavez is a 80 y.o. (1940/04/08) male who presents with chief complaint of  Chief Complaint  Patient presents with   Shortness of Breath   History of Present Illness:  Phillip Chavez is a 80 year old male with medical history significant of hypertension, hyperlipidemia, COPD not on oxygen at home, GERD, recently diagnosed CHF in [place], who presents with shortness of breath.   Patient states that he has been having upper respiratory symptoms of for almost a month. He was initially seen at the [place] and diagnosed with viral upper respiratory tract infection.  This lasted for about 10 days and he seemed to get somewhat better but then symptoms returned in past week.  He has shortness of breath, productive cough with greenish colored sputum production. Patient has fever and chills.  Patient denies chest pain, but reports right sided upper back pain.  Patient stated he has 1 episode of loose stool bowel movement, no nausea, vomiting, abdominal pain.  No symptoms of UTI.   He also reports worsening bilateral lower leg edema.  He has orthopnea, cannot lay flat for sleeping.  He states that he was recently diagnosed with CHF, but no 2D echo was done yet.  He was started on Lasix. Patient is not using oxygen normally, but was found to have oxygen desaturation to 89% on room air, which improved to 97% on 2 L oxygen ED. patient with progressively worsening renal function.  Currently, the patient is being maintained via a temporary dialysis catheter.  Vascular surgery was consulted by nephrologist Dr. Candiss Norse for PermCath insertion.  Current Facility-Administered Medications  Medication Dose Route Frequency Provider Last Rate Last Admin   0.9 %  sodium chloride infusion   Intravenous PRN Ivor Costa, MD 10 mL/hr at 01/17/21 0406 New Bag at 01/17/21 0406   acetaminophen (TYLENOL) tablet 650 mg  650 mg Oral Q6H PRN Ivor Costa, MD    650 mg at 01/18/21 0835   albuterol (PROVENTIL) (2.5 MG/3ML) 0.083% nebulizer solution 2.5 mg  2.5 mg Nebulization Q4H PRN Ivor Costa, MD   2.5 mg at 01/06/21 0453   ascorbic acid (VITAMIN C) tablet 500 mg  500 mg Oral Daily Rust-Chester, Britton L, NP   500 mg at 01/18/21 0836   aspirin EC tablet 81 mg  81 mg Oral Daily Ivor Costa, MD   81 mg at 01/18/21 4008   Chlorhexidine Gluconate Cloth 2 % PADS 6 each  6 each Topical Daily Tyler Pita, MD   6 each at 01/18/21 6761   cholecalciferol (VITAMIN D3) tablet 1,000 Units  1,000 Units Oral Daily Ivor Costa, MD   1,000 Units at 01/18/21 0836   dextromethorphan-guaiFENesin ([place] DM) 30-600 MG per 12 hr tablet 1 tablet  1 tablet Oral BID PRN Ivor Costa, MD   1 tablet at 01/17/21 1334   feeding supplement (NEPRO CARB STEADY) liquid 237 mL  237 mL Oral TID BM Enzo Bi, MD   Stopped at 01/18/21 2050   ferrous sulfate tablet 325 mg  325 mg Oral Q breakfast Lorella Nimrod, MD   325 mg at 01/18/21 0836   heparin injection 5,000 Units  5,000 Units Subcutaneous Q8H Lang Snow, NP   5,000 Units at 01/18/21 2151   heparin sodium (porcine) 1000 UNIT/ML injection            hydrALAZINE (APRESOLINE) injection 5 mg  5 mg Intravenous Q2H PRN Blaine Hamper,  Soledad Gerlach, MD       ipratropium (ATROVENT HFA) inhaler 2 puff  2 puff Inhalation TID Enzo Bi, MD   2 puff at 01/18/21 2153   levalbuterol [facility] HFA) inhaler 2 puff  2 puff Inhalation TID Enzo Bi, MD   2 puff at 01/18/21 2152   LORazepam (ATIVAN) injection 0.5 mg  0.5 mg Intravenous Q4H PRN Rust-Chester, Britton L, NP   0.5 mg at 01/13/21 5456   Or   LORazepam (ATIVAN) tablet 0.5 mg  0.5 mg Oral Q4H PRN Rust-Chester, Britton L, NP   0.5 mg at 01/17/21 1334   morphine 2 MG/ML injection 2 mg  2 mg Intravenous Q6H PRN Enzo Bi, MD       multivitamin (RENA-VIT) tablet 1 tablet  1 tablet Oral QHS Enzo Bi, MD   1 tablet at 01/18/21 2151   multivitamin with minerals tablet 1 tablet  1 tablet Oral Daily  Ivor Costa, MD   1 tablet at 01/18/21 2563   omega-3 acid ethyl esters (LOVAZA) capsule 1 g  1 g Oral Daily Ivor Costa, MD   1 g at 01/18/21 0836   ondansetron (ZOFRAN) injection 4 mg  4 mg Intravenous Q8H PRN Ivor Costa, MD   4 mg at 01/17/21 0802   pantoprazole (PROTONIX) EC tablet 40 mg  40 mg Oral QHS Benita Gutter, RPH   40 mg at 01/18/21 2151   polyethylene glycol (MIRALAX / GLYCOLAX) packet 17 g  17 g Oral BID Enzo Bi, MD   17 g at 01/18/21 2151   predniSONE (DELTASONE) tablet 50 mg  50 mg Oral Daily Rust-Chester, Huel Cote, NP   50 mg at 01/18/21 0836   rosuvastatin (CRESTOR) tablet 10 mg  10 mg Oral QHS Dallie Piles, RPH   10 mg at 01/18/21 2151   zinc sulfate capsule 220 mg  220 mg Oral Daily Rust-Chester, Huel Cote, NP   220 mg at 01/18/21 8937   Past Medical History:  Diagnosis Date   Chronic CHF (congestive heart failure) (HCC)    COPD (chronic obstructive pulmonary disease) ([place])    GERD (gastroesophageal reflux disease)    HLD (hyperlipidemia)    HTN (hypertension)    Past Surgical History:  Procedure Laterality Date   dental procedure     TEE WITHOUT CARDIOVERSION N/A 01/06/2021   Procedure: TRANSESOPHAGEAL ECHOCARDIOGRAM (TEE);  Surgeon: Minna Merritts, MD;  Location: [facility] ORS;  Service: Cardiovascular;  Laterality: N/A;   Social History Social History   Tobacco Use   Smoking status: Former    Packs/day: 1.00    Years: 20.00    Pack years: 20.00    Types: Cigarettes    Quit date: 03/21/1978    Years since quitting: 42.8   Smokeless tobacco: Never  Substance Use Topics   Alcohol use: Not Currently   Drug use: Never   Family History Family History  Problem Relation Age of Onset   Prostate cancer Father    Prostate cancer Brother    Pancreatic cancer Brother   Denies family history of peripheral artery disease, venous disease or renal disease.  Allergies  Allergen Reactions   Aggrenox [Aspirin-Dipyridamole Er] Nausea And Vomiting   Atorvastatin  Other (See Comments)    Muscle pain   Azithromycin    Cefpodoxime Other (See Comments)    headache   Ciprofloxacin    Claritin [Loratadine] Other (See Comments)    Urinary retention   Contrast Media [Iodinated Diagnostic Agents] Hives  Doxazosin Other (See Comments)    Elevated blood pressure   Doxycycline    Equal [Aspartame] Diarrhea   Fenofibrate Other (See Comments)    Muscle pain   Flomax [Tamsulosin] Other (See Comments)    Elevated blood pressure   Flunisolide Other (See Comments)    Burning sensation   Fluticasone-Salmeterol     Throat irriation   Fluvastatin Other (See Comments)    Muscle pain   Gemfibrozil Other (See Comments)    Muscle pain   Haemophilus Influenzae Vaccines Other (See Comments)    Chest pain   Hydrochlorothiazide Other (See Comments)    weakness   Levofloxacin Other (See Comments)    Nervousness, aggitation   Lisinopril Other (See Comments)    Chest pain   Moxifloxacin Other (See Comments)    Altered behavior   Niacin And Related Other (See Comments)    flushing   Oxycodone    Plavix [Clopidogrel] Other (See Comments)    Constriction in throat   Pravastatin Other (See Comments)    Elevated blood pressure   Simvastatin Other (See Comments)    Muscle pain   Terazosin Other (See Comments)    Fatigue    Zetia [Ezetimibe] Other (See Comments)    Edema    Amoxicillin Palpitations    Chest pain   Mometasone Palpitations    Elevated blood pressure   Sulfa Antibiotics Rash and Hypertension   REVIEW OF SYSTEMS (Negative unless checked)  Constitutional: [] Weight loss  [] Fever  [] Chills Cardiac: [] Chest pain   [] Chest pressure   [] Palpitations   [] Shortness of breath when laying flat   [] Shortness of breath at rest   [] Shortness of breath with exertion. Vascular:  [] Pain in legs with walking   [] Pain in legs at rest   [] Pain in legs when laying flat   [] Claudication   [] Pain in feet when walking  [] Pain in feet at rest  [] Pain in feet when  laying flat   [] History of DVT   [] Phlebitis   [x] Swelling in legs   [] Varicose veins   [] Non-healing ulcers Pulmonary:   [] Uses home oxygen   [] Productive cough   [] Hemoptysis   [] Wheeze  [] COPD   [] Asthma Neurologic:  [] Dizziness  [] Blackouts   [] Seizures   [] History of stroke   [] History of TIA  [] Aphasia   [] Temporary blindness   [] Dysphagia   [] Weakness or numbness in arms   [] Weakness or numbness in legs Musculoskeletal:  [] Arthritis   [] Joint swelling   [] Joint pain   [] Low back pain Hematologic:  [] Easy bruising  [] Easy bleeding   [] Hypercoagulable state   [] Anemic  [] Hepatitis Gastrointestinal:  [] Blood in stool   [] Vomiting blood  [] Gastroesophageal reflux/heartburn   [] Difficulty swallowing. Genitourinary:  [x] Chronic kidney disease   [] Difficult urination  [] Frequent urination  [] Burning with urination   [] Blood in urine Skin:  [] Rashes   [] Ulcers   [] Wounds Psychological:  [] History of anxiety   []  History of major depression.  Positive for COVID  Physical Examination  Vitals:   01/19/21 1051 01/19/21 1100 01/19/21 1115 01/19/21 1130  BP: (!) 143/34 (!) 138/36 (!) 139/35 (!) 133/31  Pulse: 76 76 76 77  Resp:      Temp:      TempSrc:      SpO2:      Weight:      Height:       Body mass index is 25.53 kg/m. Gen:  WD/WN, NAD Head: [place]/AT, No temporalis wasting. Prominent temp pulse  not noted. Ear/Nose/Throat: Hearing grossly intact, nares w/o erythema or drainage, oropharynx w/o Erythema/Exudate Eyes: Sclera non-icteric, conjunctiva clear Neck: Trachea midline.  No JVD.  Pulmonary:  Good air movement, respirations not labored, equal bilaterally.  Cardiac: RRR, normal S1, S2. Vascular:  Vessel Right Left  Radial Palpable Palpable  Ulnar Palpable Palpable   Right groin:  Temp cath intact.  No swelling or drainage.  Gastrointestinal: soft, non-tender/non-distended. No guarding/reflex.  Musculoskeletal: M/S 5/5 throughout.  Extremities without ischemic changes.  No  deformity or atrophy. No edema. Neurologic: Sensation grossly intact in extremities.  Symmetrical.  Speech is fluent. Motor exam as listed above. Psychiatric: Judgment intact, Mood & affect appropriate for pt's clinical situation. Dermatologic: No rashes or ulcers noted.  No cellulitis or open wounds. Lymph : No Cervical, Axillary, or Inguinal lymphadenopathy.  CBC Lab Results  Component Value Date   WBC 13.2 (H) 01/17/2021   HGB 11.5 (L) 01/17/2021   HCT 34.5 (L) 01/17/2021   MCV 85.0 01/17/2021   PLT 209 01/17/2021   BMET    Component Value Date/Time   NA 133 (L) 01/19/2021 0421   NA 139 02/01/2013 0302   K 4.7 01/19/2021 0421   K 4.3 02/01/2013 0302   CL 96 (L) 01/19/2021 0421   CL 105 02/01/2013 0302   CO2 22 01/19/2021 0421   CO2 29 02/01/2013 0302   GLUCOSE 115 (H) 01/19/2021 0421   GLUCOSE 99 02/01/2013 0302   BUN 117 (H) 01/19/2021 0421   BUN 23 (H) 02/01/2013 0302   CREATININE 7.86 (H) 01/19/2021 0421   CREATININE 1.17 02/01/2013 0302   CALCIUM 7.8 (L) 01/19/2021 0421   CALCIUM 8.8 02/01/2013 0302   GFRNONAA 6 (L) 01/19/2021 0421   GFRNONAA >60 02/01/2013 0302   GFRAA >60 02/01/2013 0302   Estimated Creatinine Clearance: 6.3 mL/min (A) (by C-G formula based on SCr of 7.86 mg/dL (H)).  COAG No results found for: INR, PROTIME  Radiology DG Chest 2 View  Result Date: 01/06/2021 CLINICAL DATA:  Shortness of breath EXAM: CHEST - 2 VIEW COMPARISON:  01/03/2021 FINDINGS: Small bilateral pleural effusions. Slight increased patchy airspace disease at the bases. Stable cardiomediastinal silhouette with aortic atherosclerosis. No pneumothorax. IMPRESSION: 1. Small bilateral pleural effusions with increasing atelectasis or infiltrates at the bases Electronically Signed   By: Donavan Foil M.D.   On: 01/06/2021 16:28   DG Chest 2 View  Result Date: 01/03/2021 CLINICAL DATA:  Dyspnea EXAM: CHEST - 2 VIEW COMPARISON:  02/01/2013 chest radiograph. FINDINGS: Stable  cardiomediastinal silhouette with normal heart size. No pneumothorax. Trace bilateral pleural effusions. Hyperinflated lungs. No pulmonary edema. Mild streaky bibasilar lung opacities. Mild hazy opacity in the right mid lung. IMPRESSION: 1. Mild hazy opacity in the right mid lung, cannot exclude mild/developing pneumonia. Follow-up chest radiograph suggested. 2. Mild streaky bibasilar lung opacities, favor atelectasis. 3. Hyperinflated lungs, suggesting COPD. 4. Trace bilateral pleural effusions. Electronically Signed   By: Ilona Sorrel M.D.   On: 01/03/2021 07:59   DG Abd 1 View  Result Date: 01/07/2021 CLINICAL DATA:  Constipation EXAM: ABDOMEN - 1 VIEW COMPARISON:  None. FINDINGS: Air seen throughout much of the colon apart from rectum. There is no significant stool burden. Increased pelvic density may reflect bladder or stool within rectum. IMPRESSION: No significant stool burden. Increased pelvic density may reflect bladder or stool within rectum. Electronically Signed   By: Macy Mis M.D.   On: 01/07/2021 15:59   MR CERVICAL SPINE WO CONTRAST  Result  Date: 01/05/2021 CLINICAL DATA:  Enterococcus bacteremia, neck pain, infection suspected EXAM: MRI CERVICAL AND THORACIC SPINE WITHOUT CONTRAST TECHNIQUE: Multiplanar and multiecho pulse sequences of the cervical and thoracic spine were obtained without intravenous contrast. COMPARISON:  None. FINDINGS: Evaluation is somewhat limited by motion artifact. MRI CERVICAL SPINE FINDINGS Alignment: Physiologic. Vertebrae: No acute fracture or suspicious lesion. Evaluation for osteomyelitis is somewhat limited in the absence of intravenous contrast; however no increased T2 signal is seen in the endplates or intervertebral discs. 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 Cord: Normal signal and morphology. Posterior Fossa, vertebral arteries, paraspinal tissues: Negative. Disc levels: C2-C3: No significant disc bulge. Facet  arthropathy. No spinal canal stenosis. Mild bilateral neural foraminal narrowing. C3-C4: Small left foraminal disc protrusion. Facet and uncovertebral hypertrophy. Severe left and moderate right neural foraminal narrowing. No spinal canal stenosis C4-C5: Moderate disc bulge. Facet arthropathy. Moderate spinal canal stenosis. Moderate bilateral neural foraminal narrowing. C5-C6: No significant disc bulge. Facet and uncovertebral hypertrophy. No spinal canal stenosis. Moderate bilateral neural foraminal narrowing. C6-C7: No significant disc bulge. Facet arthropathy. No spinal canal stenosis. Mild bilateral neural foraminal narrowing. C7-T1: No significant disc bulge. No spinal canal stenosis or neuroforaminal narrowing. MRI THORACIC SPINE FINDINGS Alignment:  Physiologic. Vertebrae: No acute fracture. Endplate degenerative changes T4-T5 without increased signal within the disc, favored to be degenerative. There is additional increased T2 signal at T6-T7, with increased T1 signal at the superior endplate, likely combination of Modic type 1 and type 2 degenerative changes, with increased T2 signal within the intervertebral disc (series 21, image 11), favored to be reactive. Cord:  Normal signal and morphology. Paraspinal and other soft tissues: No paravertebral collection. Disc levels: Facet arthropathy in the lower thoracic spine, causes mild osseous canal narrowing without significant spinal canal stenosis. Moderate left neural foraminal narrowing at T9-T10 and moderate right neural foraminal narrowing at T9-T10 and T10-T11. Partially visualized lumbar spine demonstrates a chronic appearing compression deformity of L1. IMPRESSION: 1. No definite evidence of osteomyelitis discitis, although evaluation is somewhat limited by the absence of intravenous contrast. 2. C4-C5 moderate spinal canal stenosis and moderate bilateral neural foraminal narrowing. 3. C3-C4 severe left and moderate right neural foraminal narrowing. 4.  C5-C6 moderate bilateral neural foraminal narrowing. Electronically Signed   By: Merilyn Baba M.D.   On: 01/05/2021 17:43   MR THORACIC SPINE WO CONTRAST  Result Date: 01/05/2021 CLINICAL DATA:  Enterococcus bacteremia, neck pain, infection suspected EXAM: MRI CERVICAL AND THORACIC SPINE WITHOUT CONTRAST TECHNIQUE: Multiplanar and multiecho pulse sequences of the cervical and thoracic spine were obtained without intravenous contrast. COMPARISON:  None. FINDINGS: Evaluation is somewhat limited by motion artifact. MRI CERVICAL SPINE FINDINGS Alignment: Physiologic. Vertebrae: No acute fracture or suspicious lesion. Evaluation for osteomyelitis is somewhat limited in the absence of intravenous contrast; however no increased T2 signal is seen in the endplates or intervertebral discs. 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 Cord: Normal signal and morphology. Posterior Fossa, vertebral arteries, paraspinal tissues: Negative. Disc levels: C2-C3: No significant disc bulge. Facet arthropathy. No spinal canal stenosis. Mild bilateral neural foraminal narrowing. C3-C4: Small left foraminal disc protrusion. Facet and uncovertebral hypertrophy. Severe left and moderate right neural foraminal narrowing. No spinal canal stenosis C4-C5: Moderate disc bulge. Facet arthropathy. Moderate spinal canal stenosis. Moderate bilateral neural foraminal narrowing. C5-C6: No significant disc bulge. Facet and uncovertebral hypertrophy. No spinal canal stenosis. Moderate bilateral neural foraminal narrowing. C6-C7: No significant disc bulge.  Facet arthropathy. No spinal canal stenosis. Mild bilateral neural foraminal narrowing. C7-T1: No significant disc bulge. No spinal canal stenosis or neuroforaminal narrowing. MRI THORACIC SPINE FINDINGS Alignment:  Physiologic. Vertebrae: No acute fracture. Endplate degenerative changes T4-T5 without increased signal within the disc, favored to be degenerative.  There is additional increased T2 signal at T6-T7, with increased T1 signal at the superior endplate, likely combination of Modic type 1 and type 2 degenerative changes, with increased T2 signal within the intervertebral disc (series 21, image 11), favored to be reactive. Cord:  Normal signal and morphology. Paraspinal and other soft tissues: No paravertebral collection. Disc levels: Facet arthropathy in the lower thoracic spine, causes mild osseous canal narrowing without significant spinal canal stenosis. Moderate left neural foraminal narrowing at T9-T10 and moderate right neural foraminal narrowing at T9-T10 and T10-T11. Partially visualized lumbar spine demonstrates a chronic appearing compression deformity of L1. IMPRESSION: 1. No definite evidence of osteomyelitis discitis, although evaluation is somewhat limited by the absence of intravenous contrast. 2. C4-C5 moderate spinal canal stenosis and moderate bilateral neural foraminal narrowing. 3. C3-C4 severe left and moderate right neural foraminal narrowing. 4. C5-C6 moderate bilateral neural foraminal narrowing. Electronically Signed   By: Merilyn Baba M.D.   On: 01/05/2021 17:43   US RENAL  Result Date: 01/04/2021 CLINICAL DATA:  Acute kidney injury EXAM: RENAL / URINARY TRACT ULTRASOUND COMPLETE COMPARISON:  None. FINDINGS: Right Kidney: Renal measurements: 11.6 x 4.3 x 4.3 cm = volume: 111 mL. Echogenicity within normal limits. No significant atrophy. No mass or hydronephrosis visualized. Left Kidney: Renal measurements: 12.5 x 4.9 x 4.8 cm = volume: 153 mL. No hydronephrosis. No significant atrophy. Simple cyst of the lower pole measures up to 2.2 cm and has a benign appearance. Bladder: Appears normal for degree of bladder distention. Other: None. IMPRESSION: No significant atrophy, hydronephrosis or evidence of abnormal renal echogenicity. Benign simple cyst of the left kidney. Electronically Signed   By: Aletta Edouard M.D.   On: 01/04/2021 09:38    DG Chest Port 1 View  Result Date: 01/10/2021 CLINICAL DATA:  Acute respiratory failure with hypoxia EXAM: PORTABLE CHEST 1 VIEW COMPARISON:  January 09, 2021 FINDINGS: Diffuse opacities identified throughout bilateral lungs. There are bilateral pleural effusions. Right pleural effusion with likely enlarged compared to prior exam. The mediastinal contour and cardiac silhouette are stable. Bony structures are stable. IMPRESSION: 1. Diffuse opacities identified throughout bilateral lungs concerning for pneumonia or edema unchanged compared prior exam. 2. Bilateral pleural effusions. Right pleural effusion is worsened compared prior exam. Electronically Signed   By: Abelardo Diesel M.D.   On: 01/10/2021 08:43   DG Chest Port 1 View  Result Date: 01/09/2021 CLINICAL DATA:  Acute respiratory failure with hypoxia. EXAM: PORTABLE CHEST 1 VIEW COMPARISON:  January 06, 2021. FINDINGS: The heart size and mediastinal contours are within normal limits. Significantly increased bilateral lung opacities are noted concerning for worsening pneumonia or edema. Small bilateral pleural effusions are noted. The visualized skeletal structures are unremarkable. IMPRESSION: Increased bilateral lung opacities are noted concerning for worsening pneumonia or edema. Electronically Signed   By: Marijo Conception M.D.   On: 01/09/2021 09:49   ECHOCARDIOGRAM COMPLETE  Result Date: 01/04/2021    ECHOCARDIOGRAM REPORT   Patient Name:   Phillip Chavez Date of Exam: 01/04/2021 Medical Rec #:  [id number]      Height:       65.0 in Accession #:    [id number]     Weight:  172.0 lb Date of Birth:  [birth year]      BSA:          1.855 m Patient Age:    59 years       BP:           133/37 mmHg Patient Gender: M              HR:           76 bpm. Exam Location:  [facility] Procedure: 2D Echo, 3D Echo and Strain Analysis Indications:     CHF I50.31  History:         Patient has no prior history of Echocardiogram examinations.  Sonographer:      Kathlen Brunswick RDCS Referring Phys:  [id number] Soledad Gerlach NIU Diagnosing Phys: Kate Sable MD  Sonographer Comments: Global longitudinal strain was attempted. IMPRESSIONS  1. Left ventricular ejection fraction, by estimation, is 65 to 70%. The left ventricle has normal function. The left ventricle has no regional wall motion abnormalities. Left ventricular diastolic parameters were normal. The average left ventricular global longitudinal strain is -23.6 %. The global longitudinal strain is normal.  2. Right ventricular systolic function is normal. The right ventricular size is normal.  3. Left atrial size was mildly dilated.  4. Right atrial size was mildly dilated.  5. The mitral valve is normal in structure. Mild mitral valve regurgitation.  6. The aortic valve is calcified. Aortic valve regurgitation is mild. Mild aortic valve stenosis.  7. The inferior vena cava is dilated in size with >50% respiratory variability, suggesting right atrial pressure of 8 mmHg. FINDINGS  Left Ventricle: Left ventricular ejection fraction, by estimation, is 65 to 70%. The left ventricle has normal function. The left ventricle has no regional wall motion abnormalities. The average left ventricular global longitudinal strain is -23.6 %. The global longitudinal strain is normal. 3D left ventricular ejection fraction analysis performed but not reported based on interpreter judgement due to suboptimal quality. The left ventricular internal cavity size was normal in size. There is no left ventricular hypertrophy. Left ventricular diastolic parameters were normal. Right Ventricle: The right ventricular size is normal. No increase in right ventricular wall thickness. Right ventricular systolic function is normal. Left Atrium: Left atrial size was mildly dilated. Right Atrium: Right atrial size was mildly dilated. Pericardium: There is no evidence of pericardial effusion. Mitral Valve: The mitral valve is normal in structure. Mild mitral valve  regurgitation. Tricuspid Valve: The tricuspid valve is normal in structure. Tricuspid valve regurgitation is trivial. Aortic Valve: The aortic valve is calcified. Aortic valve regurgitation is mild. Aortic regurgitation PHT measures 362 msec. Mild aortic stenosis is present. Aortic valve mean gradient measures 13.3 mmHg. Aortic valve peak gradient measures 28.9 mmHg. Aortic valve area, by VTI measures 1.95 cm. Pulmonic Valve: The pulmonic valve was not well visualized. Pulmonic valve regurgitation is not visualized. Aorta: The aortic root is normal in size and structure. Venous: The inferior vena cava is dilated in size with greater than 50% respiratory variability, suggesting right atrial pressure of 8 mmHg. IAS/Shunts: No atrial level shunt detected by color flow Doppler.  LEFT VENTRICLE PLAX 2D LVIDd:         5.50 cm   Diastology LVIDs:         3.04 cm   LV e' medial:    11.20 cm/s LV PW:         1.04 cm   LV E/e' medial:  10.9 LV IVS:  1.01 cm   LV e' lateral:   10.30 cm/s LVOT diam:     2.10 cm   LV E/e' lateral: 11.8 LV SV:         112 LV SV Index:   60        2D Longitudinal Strain LVOT Area:     3.46 cm  2D Strain GLS Avg:     -23.6 %                           3D Volume EF:                          3D EF:        75 %                          LV EDV:       200 ml                          LV ESV:       51 ml                          LV SV:        149 ml RIGHT VENTRICLE RV Basal diam:  3.59 cm RV S prime:     20.20 cm/s TAPSE (M-mode): 3.7 cm LEFT ATRIUM             Index        RIGHT ATRIUM           Index LA diam:        4.60 cm 2.48 cm/m   RA Area:     19.30 cm LA Vol (A2C):   50.9 ml 27.43 ml/m  RA Volume:   66.30 ml  35.74 ml/m LA Vol (A4C):   73.2 ml 39.45 ml/m LA Biplane Vol: 63.8 ml 34.39 ml/m  AORTIC VALVE                     PULMONIC VALVE AV Area (Vmax):    2.20 cm      PV Vmax:       0.94 m/s AV Area (Vmean):   2.09 cm      PV Peak grad:  3.5 mmHg AV Area (VTI):     1.95 cm AV Vmax:            268.67 cm/s AV Vmean:          163.333 cm/s AV VTI:            0.576 m AV Peak Grad:      28.9 mmHg AV Mean Grad:      13.3 mmHg LVOT Vmax:         171.00 cm/s LVOT Vmean:        98.500 cm/s LVOT VTI:          0.324 m LVOT/AV VTI ratio: 0.56 AI PHT:            362 msec  AORTA Ao Root diam: 3.00 cm MITRAL VALVE                TRICUSPID VALVE MV Area (PHT): 3.66 cm     TV Peak grad:   41.7 mmHg MV Decel Time: 207 msec  TV Vmax:        3.23 m/s MV E velocity: 122.00 cm/s MV A velocity: 89.40 cm/s   SHUNTS MV E/A ratio:  1.36         Systemic VTI:  0.32 m                             Systemic Diam: 2.10 cm Kate Sable MD Electronically signed by Kate Sable MD Signature Date/Time: 01/04/2021/1:36:54 PM    Final    ECHO TEE  Result Date: 01/07/2021    TRANSESOPHOGEAL ECHO REPORT   Patient Name:   Phillip Chavez Date of Exam: 01/06/2021 Medical Rec #:  [id number]      Height:       65.0 in Accession #:    [id number]     Weight:       176.1 lb Date of Birth:  1940/10/02      BSA:          1.874 m Patient Age:    19 years       BP:           120/49 mmHg Patient Gender: M              HR:           79 bpm. Exam Location:  [facility] Procedure: Transesophageal Echo, Color Doppler, Cardiac Doppler and Saline            Contrast Bubble Study Indications:     bacteremia, r/o endocarditis, aortic valve disorder  History:         Patient has prior history of Echocardiogram examinations, most                  recent 01/04/2021. CHF, COPD; Risk Factors:Hypertension and                  Dyslipidemia.  Sonographer:     Sherrie Sport Referring Phys:  [id number] GGEZMOQH A ARIDA Diagnosing Phys: Ida Rogue MD PROCEDURE: After discussion of the risks and benefits of a TEE, an informed consent was obtained from the patient. TEE procedure time was 30 minutes. The transesophogeal probe was passed without difficulty through the esophogus of the patient. Imaged were obtained with the patient in a left lateral decubitus position.  Local oropharyngeal anesthetic was provided with Benzocaine spray and Cetacaine. Sedation performed by performing physician. Image quality was excellent. The patient's vital signs; including heart rate, blood pressure, and oxygen saturation; remained stable throughout the procedure. The patient developed no complications during the procedure. IMPRESSIONS  1. No valve endocarditis noted.  2. Left ventricular ejection fraction, by estimation, is 60 to 65%. The left ventricle has normal function. The left ventricle has no regional wall motion abnormalities.  3. Right ventricular systolic function is normal. The right ventricular size is normal. There is moderately elevated pulmonary artery systolic pressure. The estimated right ventricular systolic pressure is 47.6 mmHg.  4. Left atrial size was moderately dilated. No left atrial/left atrial appendage thrombus was detected.  5. Right atrial size was mildly dilated.  6. The mitral valve is normal in structure. Mild mitral valve regurgitation. No evidence of mitral stenosis.  7. Tricuspid valve regurgitation is moderate to severe.  8. 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  9. 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 No aortic stenosis is  present. 10. There is mild to moderate (Grade II-III) plaque involving the descending aorta. 11. Agitated saline contrast bubble study was negative, with no evidence of any interatrial shunt. Conclusion(s)/Recommendation(s): Normal biventricular function without evidence of hemodynamically significant valvular heart disease. FINDINGS  Left Ventricle: Left ventricular ejection fraction, by estimation, is 60 to 65%. The left ventricle has normal function. The left ventricle has no regional wall motion abnormalities. The left ventricular internal cavity size was normal in size. There is  no left ventricular hypertrophy.  Right Ventricle: The right ventricular size is normal. No increase in right ventricular wall thickness. Right ventricular systolic function is normal. There is moderately elevated pulmonary artery systolic pressure. The tricuspid regurgitant velocity is 3.41 m/s, and with an assumed right atrial pressure of 10 mmHg, the estimated right ventricular systolic pressure is 35.0 mmHg. Left Atrium: Left atrial size was moderately dilated. No left atrial/left atrial appendage thrombus was detected. Right Atrium: Right atrial size was mildly dilated. Pericardium: There is no evidence of pericardial effusion. Mitral Valve: The mitral valve is normal in structure. Mild mitral valve regurgitation. No evidence of mitral valve stenosis. Tricuspid Valve: The tricuspid valve is normal in structure. Tricuspid valve regurgitation is moderate to severe. No evidence of tricuspid stenosis. Aortic Valve: The aortic valve is tricuspid. 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 No aortic stenosis is present. Pulmonic Valve: The pulmonic valve was normal in structure. Pulmonic valve regurgitation is not visualized. No evidence of pulmonic stenosis. Aorta: The aortic root is normal in size and structure. There is moderate (Grade III) plaque involving the descending aorta. Venous: The inferior vena cava is normal in size with greater than 50% respiratory variability, suggesting right atrial pressure of 3 mmHg. IAS/Shunts: No atrial level shunt detected by color flow Doppler. Agitated saline contrast was given intravenously to evaluate for intracardiac shunting. Agitated saline contrast bubble study was negative, with no evidence of any interatrial shunt. There  is no evidence of a patent foramen ovale. There is no evidence of an atrial septal defect.  TRICUSPID VALVE TR Peak grad:   46.5 mmHg TR Vmax:        341.00 cm/s Ida Rogue MD Electronically signed by Ida Rogue MD Signature Date/Time: 01/07/2021/11:27:44 AM    Final      Assessment/Plan Phillip Chavez is a 80 year old male with medical history significant of hypertension, hyperlipidemia, COPD not on oxygen at home, GERD, recently diagnosed CHF in [place], who presents with shortness of breath.  1.  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  He is currently being maintained via a temporary dialysis catheter for hemodialysis.  In preparation for discharge recommend placing a PermCath to allow the patient to dialyze both in the inpatient outpatient setting.  Procedure, risks and benefits were explained to the patient.  All questions were answered.  Recent diagnosis of COVID.  Today will be his last day of isolation.  I will make the patient n.p.o. in preparation for PermCath insertion with Dr. Delana Meyer tomorrow.  2.  COVID-19: Recent COVID-19 infection during this hospitalization. Patient will be off isolation as of tomorrow.  3.  Hyperlipidemia: On statin for medical management. Encouraged good control as its slows the progression of atherosclerotic disease.  Discussed with Dr. Lucky Cowboy / Schnier  Sela Hua, PA-C 01/19/2021 11:36 AM  This note was created with Dragon medical transcription system.  Any error is purely unintentional.

## 2021-01-19 NOTE — Progress Notes (Signed)
[facility]  PROGRESS NOTE   Subjective:   Patient seen during dialysis.  Tolerating fair.  Ultrafiltration had to be turned off due to low blood pressure.   HEMODIALYSIS FLOWSHEET:  Blood Flow Rate (mL/min): 400 mL/min Arterial Pressure (mmHg): -200 mmHg Venous Pressure (mmHg): 160 mmHg Transmembrane Pressure (mmHg): 40 mmHg Ultrafiltration Rate (mL/min): 0 mL/min Dialysate Flow Rate (mL/min): 500 ml/min Conductivity: Machine : 13.5 Conductivity: Machine : 13.5 Dialysis Fluid Bolus: Normal Saline Bolus Amount (mL): 250 mL   Denies any acute complaints.  He is able to eat without any nausea or vomiting.  Ate almost 100% of his breakfast.  Objective:  Vital signs in last 24 hours:  Temp:  [97.4 F (36.3 C)-98.4 F (36.9 C)] 98 F (36.7 C) (11/21 1519) Pulse Rate:  [67-86] 76 (11/21 1519) Resp:  [15-18] 16 (11/21 1519) BP: (114-144)/(27-53) 141/29 (11/21 1519) SpO2:  [92 %-97 %] 92 % (11/21 1519) Weight:  [68 kg-69.6 kg] 68 kg (11/21 1415)  Weight change:  Filed Weights   01/17/21 0337 01/19/21 1025 01/19/21 1415  Weight: 70.8 kg 69.6 kg 68 kg    Intake/Output: I/O last 3 completed shifts: In: 969.7 [P.O.:847; I.V.:22.7; IV Piggyback:100] Out: 500 [Urine:500]   Intake/Output this shift:  Total I/O In: 0.3 [I.V.:0.3] Out: -1083   Physical Exam: General:  No acute distress, laying in bed  Head:  Normocephalic, atraumatic. Moist oral mucosal membranes  Neck:  Supple  Lungs:   Clear to auscultation, 3 L [place] O2.   Heart:  regular  Abdomen:   Soft, nontender,   Extremities:  + peripheral edema.  Neurologic:  Awake and alert  Skin:  No lesions  Access: Right femoral temp HD catheter 39/03    Basic Metabolic Panel: Recent Labs  Lab 01/14/21 0445 01/15/21 0856 01/16/21 0849 01/17/21 0438 01/18/21 0441 01/19/21 0421  NA 137 136 137 136 135 133*  K 4.5 4.1 4.3 3.8 4.3  4.3 4.7  CL 99 96* 104 98 96* 96*  CO2 22 22 27 26 24 22   GLUCOSE 156*  131* 105* 107* 123* 115*  BUN 89* 72* 28* 59* 100* 117*  CREATININE 6.62* 6.26* 1.15 5.19* 7.05* 7.86*  CALCIUM 7.9* 7.8* 8.6* 7.8* 7.9* 7.8*  MG 2.7* 2.5* 2.1 2.2 2.4  --      CBC: Recent Labs  Lab 01/13/21 0843 01/14/21 0445 01/15/21 0856 01/16/21 0849 01/17/21 0438  WBC 8.5 10.5 13.5* 4.4 13.2*  HGB 9.6* 10.1* 10.6* 10.3* 11.5*  HCT 28.7* 29.7* 32.2* 32.6* 34.5*  MCV 86.2 85.8 87.0 99.1 85.0  PLT 237 269 247 204 209      Urinalysis: No results for input(s): COLORURINE, LABSPEC, PHURINE, GLUCOSEU, HGBUR, BILIRUBINUR, KETONESUR, PROTEINUR, UROBILINOGEN, NITRITE, LEUKOCYTESUR in the last 72 hours.  Invalid input(s): APPERANCEUR     Imaging: No results found.   Medications:    sodium chloride Stopped (01/18/21 0603)    vitamin C  500 mg Oral Daily   aspirin EC  81 mg Oral Daily   Chlorhexidine Gluconate Cloth  6 each Topical Daily   cholecalciferol  1,000 Units Oral Daily   feeding supplement (NEPRO CARB STEADY)  237 mL Oral TID BM   ferrous sulfate  325 mg Oral Q breakfast   heparin injection (subcutaneous)  5,000 Units Subcutaneous Q8H   heparin sodium (porcine)       ipratropium  2 puff Inhalation TID   levalbuterol  2 puff Inhalation TID   multivitamin  1 tablet Oral  QHS   multivitamin with minerals  1 tablet Oral Daily   omega-3 acid ethyl esters  1 g Oral Daily   pantoprazole  40 mg Oral QHS   polyethylene glycol  17 g Oral BID   predniSONE  50 mg Oral Daily   rosuvastatin  10 mg Oral QHS   zinc sulfate  220 mg Oral Daily    Assessment/ Plan:     Principal Problem:   Acute respiratory failure with hypoxia (HCC) Active Problems:   CAP (community acquired pneumonia)   Sepsis ([place])   COPD (chronic obstructive pulmonary disease) (HCC)   GERD (gastroesophageal reflux disease)   HTN (hypertension)   HLD (hyperlipidemia)   Elevated troponin   AKI (acute kidney injury) ([place])   Normocytic anemia   Aortic valve regurgitation   COVID-19 virus  infection  Phillip Chavez is a 80 y.o. white male with hypertension, coronary artery disease, congestive heart failure, COPD, GERD, hyperlipidemia, admitted to [facility] on CAP (community acquired pneumonia) [J18.9] AKI (acute kidney injury) ([place]) [N17.9] Community acquired pneumonia, unspecified laterality [J18.9] Congestive heart failure, unspecified HF chronicity, unspecified heart failure type ([place]) [I50.9]    #1: Acute kidney injury with hyperkalemia: on chronic kidney disease stage IIIB, baseline 1.64, GFR of 42 on admission.  Requiring hemodialysis. Resistant to furosemide.  Last hemodialysis treatment was Monday, 11/21 - Continue to monitor for dialysis need. Plan on MWF schedule, next treatment for Wednesday - Currently with no signs of renal recovery.  Plan for PermCath placement on Tuesday  #2. Anemia with chronic kidney disease: hemoglobin 11.5. No indication for ESA.   #3. Sepsis: E. Faecalis on blood cultures on 11/5.  Completed course of IV pip/tazo.   - Appreciate ID input.   #4. Acute respiratory failure with COVID-19 infection and pulmonary edema. Oxygen requirements have improved       LOS: [place], MD [facility] 11/21/20223:27 PM

## 2021-01-19 NOTE — Progress Notes (Signed)
PROGRESS NOTE  CHANCELOR HARDRICK WFU:[id number] DOB: 1940/07/09 DOA: 01/03/2021 [facility]  HPI/Recap of past 6 hours: 80 year old male with past medical history of hypertension, CAD, COPD and hyperlipidemia admitted on 11/5 for shortness of breath that have been going on for over a week prior.  Patient found to have sepsis on admission secondary to pneumonia with positive blood cultures for Enterococcus.  Complicating matters were that patient contracted COVID wife during hospitalization and developed worsening respiratory failure on 11/11 requiring BiPAP and placement in stepdown unit.  Found to also have moderate to severe aortic regurg with pulmonary edema.  Acute kidney injury worsened with attempted diuresis and patient started on temporary dialysis.  Patient also has issues with nightly sundowning.  Patient continues to improve.  Oxygen weaned down to 2 L.  Palliative care consulted on 11/15.  Patient continues to get dialysis with nephrology considering permacath when he is off isolation.  By 11/17, patient frustrated, saying he wants to go home and refusing his medications.  Both palliative care and this attending followed up with patient who conveyed that he did not want to stop treatments, and that also he has full say over his care.  At times still worn out, but still willing to do dialysis.  Patient better spirits today.  No complaints.  Assessment/Plan: Principal Problem:   Acute respiratory failure with hypoxia ([place]): Secondary to COVID infection, community-acquired pneumonia and volume overload from severe aortic regurgitation: Continue diuresis, Remdisivir.  Has completed IV antibiotics.  Have been able to wean down oxygen from 5 L down to 2 L.  Active Problems: Enterococcus sepsis ([place])  Met criteria for sepsis on admission given leukocytosis and heart rate greater than 90.  Appreciate infectious disease help.  Sepsis is since resolved.  Completed IV Zosyn  course.  Enterococcus not felt to be associated with his pneumonia.        GERD (gastroesophageal reflux disease): Continue PPI.    HTN (hypertension): Holding Cozaar and metoprolol due to low episodes of blood pressure.    HLD (hyperlipidemia): Continue statin.    Elevated troponin: Felt to be demand ischemia, likely from sepsis/poor valvular function.    AKI (acute kidney injury) ([place]) in the setting of stage IIIb chronic kidney disease: Appreciate nephrology help.  Considering PermCath when he is off isolation, which will be after 11/21.  Vascular surgery plans to place permacath tomorrow.    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  Continue to follow.   Aortic valve regurgitation   COVID-19 virus infection: Completed IV Remdisivir.   Code Status: DNR  Family Communication: Updated wife by phone.  Disposition Plan: Once permacath cleared by general surgery for use and patient's outpatient dialysis schedule set, can look at discharge in the next few days.   Consultants: Palliative care Nephrology Vascular surgery  Procedures: 2D echo: Preserved ejection fraction, severe moderate to severe aortic regurg, no evidence of diastolic dysfunction TEE: No evidence of endocarditis Continued hemodialysis  Antimicrobials: IV cefepime and vancomycin 11/5-11/11 IV Zosyn 11/11-11/19 IV Remdisivir 11/14-11/18  DVT prophylaxis: Subcu heparin  Level of care: Med-Surg   Objective: Vitals:   01/19/21 0427 01/19/21 0848  BP: (!) 144/39 (!) 135/37  Pulse: 72 70  Resp: 16 18  Temp: 98.4 F (36.9 C) 97.8 F (36.6 C)  SpO2:  96%    Intake/Output Summary (Last 24 hours) at 01/19/2021 0904 Last data filed at 01/19/2021 0545 Gross per 24 hour  Intake 367 ml  Output 300 ml  Net 67 ml    Filed Weights   01/14/21 1208 01/16/21 0500 01/17/21 0337  Weight: 69.8 kg 73 kg 70.8 kg   Body mass index is 25.97 kg/m.  Exam:  General: Alert and oriented x2, no acute  distress, resting comfortably HEENT: Normocephalic and atraumatic, mucous membranes are Cardiovascular: Regular rate and rhythm, S1-S2 Respiratory: Clear to auscultation bilaterally Abdomen: Soft, nontender, nondistended, hypoactive bowel sounds Musculoskeletal: No clubbing or cyanosis, 2+ pitting edema Psychiatry: Appropriate, no evidence of psychoses   Data Reviewed: CBC: Recent Labs  Lab 01/13/21 0843 01/14/21 0445 01/15/21 0856 01/16/21 0849 01/17/21 0438  WBC 8.5 10.5 13.5* 4.4 13.2*  HGB 9.6* 10.1* 10.6* 10.3* 11.5*  HCT 28.7* 29.7* 32.2* 32.6* 34.5*  MCV 86.2 85.8 87.0 99.1 85.0  PLT 237 269 247 204 725    Basic Metabolic Panel: Recent Labs  Lab 01/14/21 0445 01/15/21 0856 01/16/21 0849 01/17/21 0438 01/18/21 0441 01/19/21 0421  NA 137 136 137 136 135 133*  K 4.5 4.1 4.3 3.8 4.3  4.3 4.7  CL 99 96* 104 98 96* 96*  CO2 _0 GLUCOSE 156* 131* 105* 107* 123* 115*  BUN 89* 72* 28* 59* 100* 117*  CREATININE 6.62* 6.26* 1.15 5.19* 7.05* 7.86*  CALCIUM 7.9* 7.8* 8.6* 7.8* 7.9* 7.8*  MG 2.7* 2.5* 2.1 2.2 2.4  --     GFR: Estimated Creatinine Clearance: 6.3 mL/min (A) (by C-G formula based on SCr of 7.86 mg/dL (H)). Liver Function Tests: Recent Labs  Lab 01/13/21 0843  AST 31  ALT 55*  ALKPHOS 83  BILITOT 0.7  PROT 6.0*  ALBUMIN 2.4*    No results for input(s): LIPASE, AMYLASE in the last 168 hours. No results for input(s): AMMONIA in the last 168 hours. Coagulation Profile: No results for input(s): INR, PROTIME in the last 168 hours. Cardiac Enzymes: No results for input(s): CKTOTAL, CKMB, CKMBINDEX, TROPONINI in the last 168 hours. BNP (last 3 results) No results for input(s): PROBNP in the last 8760 hours. HbA1C: No results for input(s): HGBA1C in the last 72 hours. CBG: Recent Labs  Lab 01/15/21 1958 01/17/21 0953 01/17/21 1242 01/17/21 1622 01/17/21 2049  GLUCAP 126* 104* 175* 201* 160*    Lipid Profile: No results for  input(s): CHOL, HDL, LDLCALC, TRIG, CHOLHDL, LDLDIRECT in the last 72 hours. Thyroid Function Tests: No results for input(s): TSH, T4TOTAL, FREET4, T3FREE, THYROIDAB in the last 72 hours. Anemia Panel: No results for input(s): VITAMINB12, FOLATE, FERRITIN, TIBC, IRON, RETICCTPCT in the last 72 hours.  Urine analysis:    Component Value Date/Time   COLORURINE YELLOW (A) 01/09/2021 0952   APPEARANCEUR CLOUDY (A) 01/09/2021 0952   LABSPEC 1.023 01/09/2021 0952   PHURINE 5.0 01/09/2021 0952   GLUCOSEU 50 (A) 01/09/2021 0952   HGBUR MODERATE (A) 01/09/2021 0952   BILIRUBINUR NEGATIVE 01/09/2021 0952   KETONESUR 5 (A) 01/09/2021 0952   PROTEINUR 30 (A) 01/09/2021 0952   NITRITE NEGATIVE 01/09/2021 0952   LEUKOCYTESUR NEGATIVE 01/09/2021 0952   Sepsis Labs: _1 (procalcitonin:4,lacticidven:4)  ) Recent Results (from the past 240 hour(s))  MRSA Next Gen by PCR, Nasal     Status: None   Collection Time: 01/09/21  9:50 AM   Specimen: Nasal Mucosa; Nasal Swab  Result Value Ref Range Status   MRSA by PCR Next Gen NOT DETECTED NOT DETECTED Final    Comment: (NOTE) The GeneXpert MRSA Assay (FDA approved for NASAL specimens only), is one  component of a comprehensive MRSA colonization surveillance program. It is not intended to diagnose MRSA infection nor to guide or monitor treatment for MRSA infections. Test performance is not FDA approved in patients less than 47 years old. Performed at [facility], [address] [place], [place] [address]   Resp Panel by RT-PCR (Flu A&B, Covid) Nasopharyngeal Swab     Status: Abnormal   Collection Time: 01/10/21 10:46 PM   Specimen: Nasopharyngeal Swab; Nasopharyngeal(NP) swabs in vial transport medium  Result Value Ref Range Status   SARS Coronavirus 2 by RT PCR POSITIVE (A) NEGATIVE Final    Comment: RESULT CALLED TO, READ BACK BY AND VERIFIED WITH: ERICKA MCMILLIAN @ 0025 01/11/21 LFD (NOTE) SARS-CoV-2 target nucleic acids  are DETECTED.  The SARS-CoV-2 RNA is generally detectable in upper respiratory specimens during the acute phase of infection. Positive results are indicative of the presence of the identified virus, but do not rule out bacterial infection or co-infection with other pathogens not detected by the test. Clinical correlation with patient history and other diagnostic information is necessary to determine patient infection status. The expected result is Negative.  Fact Sheet for Patients: EntrepreneurPulse.com.au  Fact Sheet for Healthcare Providers: IncredibleEmployment.be  This test is not yet approved or cleared by the [place] FDA and  has been authorized for detection and/or diagnosis of SARS-CoV-2 by FDA under an Emergency Use Authorization (EUA).  This EUA will remain in effect (meaning this test can  be used) for the duration of  the COVID-19 declaration under Section 564(b)(1) of the Act, 21 U.S.C. section 360bbb-3(b)(1), unless the authorization is terminated or revoked sooner.     Influenza A by PCR NEGATIVE NEGATIVE Final   Influenza B by PCR NEGATIVE NEGATIVE Final    Comment: (NOTE) The Xpert Xpress SARS-CoV-2/FLU/RSV plus assay is intended as an aid in the diagnosis of influenza from Nasopharyngeal swab specimens and should not be used as a sole basis for treatment. Nasal washings and aspirates are unacceptable for Xpert Xpress SARS-CoV-2/FLU/RSV testing.  Fact Sheet for Patients: EntrepreneurPulse.com.au  Fact Sheet for Healthcare Providers: IncredibleEmployment.be  This test is not yet approved or cleared by the [place] FDA and has been authorized for detection and/or diagnosis of SARS-CoV-2 by FDA under an Emergency Use Authorization (EUA). This EUA will remain in effect (meaning this test can be used) for the duration of the COVID-19 declaration under Section 564(b)(1) of the Act,  21 U.S.C. section 360bbb-3(b)(1), unless the authorization is terminated or revoked.  Performed at [facility], [address] [place], [place] [address]       Studies: No results found.  Scheduled Meds:  vitamin C  500 mg Oral Daily   aspirin EC  81 mg Oral Daily   Chlorhexidine Gluconate Cloth  6 each Topical Daily   cholecalciferol  1,000 Units Oral Daily   feeding supplement (NEPRO CARB STEADY)  237 mL Oral TID BM   ferrous sulfate  325 mg Oral Q breakfast   heparin injection (subcutaneous)  5,000 Units Subcutaneous Q8H   ipratropium  2 puff Inhalation TID   levalbuterol  2 puff Inhalation TID   multivitamin  1 tablet Oral QHS   multivitamin with minerals  1 tablet Oral Daily   omega-3 acid ethyl esters  1 g Oral Daily   pantoprazole  40 mg Oral QHS   polyethylene glycol  17 g Oral BID   predniSONE  50 mg Oral Daily   rosuvastatin  10  mg Oral QHS   zinc sulfate  220 mg Oral Daily    Continuous Infusions:  sodium chloride 10 mL/hr at 01/17/21 0406     LOS: 16 days     Annita Brod, MD [facility]   01/19/2021, 9:04 AM

## 2021-01-19 NOTE — Progress Notes (Addendum)
Patient accepted to [facility] MWF 7am. Patient can start on 11/23 at 6:30am. Patient will need to tolerate sitting in a recliner for at least 2 to 3 hours. Please provide a note indicating this, including how patient transferred to recliner.

## 2021-01-20 ENCOUNTER — Encounter
Admission: EM | Disposition: A | Discharge status: Skilled Nursing Facility | Payer: Self-pay | Source: Home / Self Care | Attending: Hospitalist

## 2021-01-20 DIAGNOSIS — J9601 Acute respiratory failure with hypoxia: Secondary | ICD-10-CM | POA: Diagnosis not present

## 2021-01-20 DIAGNOSIS — Z992 Dependence on renal dialysis: Secondary | ICD-10-CM

## 2021-01-20 DIAGNOSIS — U071 COVID-19: Secondary | ICD-10-CM | POA: Diagnosis not present

## 2021-01-20 DIAGNOSIS — N185 Chronic kidney disease, stage 5: Secondary | ICD-10-CM

## 2021-01-20 DIAGNOSIS — N186 End stage renal disease: Secondary | ICD-10-CM

## 2021-01-20 DIAGNOSIS — N179 Acute kidney failure, unspecified: Secondary | ICD-10-CM | POA: Diagnosis not present

## 2021-01-20 DIAGNOSIS — J449 Chronic obstructive pulmonary disease, unspecified: Secondary | ICD-10-CM | POA: Diagnosis not present

## 2021-01-20 DIAGNOSIS — I351 Nonrheumatic aortic (valve) insufficiency: Secondary | ICD-10-CM | POA: Diagnosis not present

## 2021-01-20 HISTORY — PX: DIALYSIS/PERMA CATHETER INSERTION: CATH118288

## 2021-01-20 LAB — CBC
HCT: 32.8 % — ABNORMAL LOW (ref 39.0–52.0)
Hemoglobin: 11.2 g/dL — ABNORMAL LOW (ref 13.0–17.0)
MCH: 29 pg (ref 26.0–34.0)
MCHC: 34.1 g/dL (ref 30.0–36.0)
MCV: 85 fL (ref 80.0–100.0)
Platelets: 149 10*3/uL — ABNORMAL LOW (ref 150–400)
RBC: 3.86 MIL/uL — ABNORMAL LOW (ref 4.22–5.81)
RDW: 16.6 % — ABNORMAL HIGH (ref 11.5–15.5)
WBC: 11.8 10*3/uL — ABNORMAL HIGH (ref 4.0–10.5)
nRBC: 0 % (ref 0.0–0.2)

## 2021-01-20 LAB — BASIC METABOLIC PANEL
Anion gap: 11 (ref 5–15)
BUN: 61 mg/dL — ABNORMAL HIGH (ref 8–23)
CO2: 26 mmol/L (ref 22–32)
Calcium: 7.8 mg/dL — ABNORMAL LOW (ref 8.9–10.3)
Chloride: 95 mmol/L — ABNORMAL LOW (ref 98–111)
Creatinine, Ser: 4.78 mg/dL — ABNORMAL HIGH (ref 0.61–1.24)
GFR, Estimated: 11 mL/min — ABNORMAL LOW (ref >60)
Glucose, Bld: 140 mg/dL — ABNORMAL HIGH (ref 70–99)
Potassium: 4.6 mmol/L (ref 3.5–5.1)
Sodium: 132 mmol/L — ABNORMAL LOW (ref 135–145)

## 2021-01-20 SURGERY — DIALYSIS/PERMA CATHETER INSERTION
Anesthesia: Moderate Sedation

## 2021-01-20 MED ORDER — DIPHENHYDRAMINE HCL 50 MG/ML IJ SOLN: 50.0000 mg | Freq: Once | INTRAMUSCULAR | Status: DC | PRN

## 2021-01-20 MED ORDER — HYDROMORPHONE HCL 1 MG/ML IJ SOLN: 1.0000 mg | Freq: Once | INTRAMUSCULAR | Status: DC | PRN

## 2021-01-20 MED ORDER — MIDAZOLAM HCL 2 MG/2ML IJ SOLN
INTRAMUSCULAR | Status: AC
  Filled 2021-01-20: qty 2

## 2021-01-20 MED ORDER — PREDNISONE 20 MG PO TABS: 20.0000 mg | ORAL_TABLET | Freq: Every day | ORAL | Status: AC

## 2021-01-20 MED ORDER — MIDAZOLAM HCL 2 MG/2ML IJ SOLN
INTRAMUSCULAR | Status: DC | PRN
  Administered 2021-01-20: 1 mg via INTRAVENOUS

## 2021-01-20 MED ORDER — FENTANYL CITRATE PF 50 MCG/ML IJ SOSY
PREFILLED_SYRINGE | INTRAMUSCULAR | Status: AC
  Filled 2021-01-20: qty 1

## 2021-01-20 MED ORDER — ONDANSETRON HCL 4 MG/2ML IJ SOLN
4.0000 mg | Freq: Four times a day (QID) | INTRAMUSCULAR | Status: DC | PRN
  Administered 2021-01-22: 4 mg via INTRAVENOUS
  Filled 2021-01-20: qty 2

## 2021-01-20 MED ORDER — METHYLPREDNISOLONE SODIUM SUCC 125 MG IJ SOLR: 125.0000 mg | Freq: Once | INTRAMUSCULAR | Status: DC | PRN

## 2021-01-20 MED ORDER — MIDAZOLAM HCL 2 MG/ML PO SYRP: 8.0000 mg | ORAL_SOLUTION | Freq: Once | ORAL | Status: DC | PRN

## 2021-01-20 MED ORDER — CHLORHEXIDINE GLUCONATE CLOTH 2 % EX PADS
6.0000 | MEDICATED_PAD | Freq: Every day | CUTANEOUS | Status: DC
  Administered 2021-01-20 – 2021-01-24 (×5): 6 via TOPICAL

## 2021-01-20 MED ORDER — FENTANYL CITRATE (PF) 100 MCG/2ML IJ SOLN
INTRAMUSCULAR | Status: DC | PRN
  Administered 2021-01-20: 25 ug via INTRAVENOUS

## 2021-01-20 MED ORDER — FAMOTIDINE 20 MG PO TABS: 40.0000 mg | ORAL_TABLET | Freq: Once | ORAL | Status: DC | PRN

## 2021-01-20 MED ORDER — VANCOMYCIN HCL 1000 MG/200ML IV SOLN
1000.0000 mg | INTRAVENOUS | Status: DC
  Administered 2021-01-20: 1000 mg via INTRAVENOUS
  Filled 2021-01-20: qty 200

## 2021-01-20 MED ORDER — SODIUM CHLORIDE 0.9 % IV SOLN
INTRAVENOUS | Status: DC
  Administered 2021-01-20: 1000 mL via INTRAVENOUS

## 2021-01-20 SURGICAL SUPPLY — 10 items
BIOPATCH RED 1 DISK 7.0 (GAUZE/BANDAGES/DRESSINGS) ×2 IMPLANT
CATH CANNON HEMO 15FR 19 (HEMODIALYSIS SUPPLIES) ×2 IMPLANT
COVER PROBE U/S 5X48 (MISCELLANEOUS) ×2 IMPLANT
DERMABOND ADVANCED (GAUZE/BANDAGES/DRESSINGS) ×1
DERMABOND ADVANCED .7 DNX12 (GAUZE/BANDAGES/DRESSINGS) ×1 IMPLANT
GAUZE SPONGE 4X4 12PLY STRL (GAUZE/BANDAGES/DRESSINGS) ×4 IMPLANT
PACK ANGIOGRAPHY (CUSTOM PROCEDURE TRAY) ×2 IMPLANT
SUT MNCRL AB 4-0 PS2 18 (SUTURE) ×2 IMPLANT
SUT SILK 0 FSL (SUTURE) ×2 IMPLANT
TOWEL OR 17X26 4PK STRL BLUE (TOWEL DISPOSABLE) ×2 IMPLANT

## 2021-01-20 NOTE — Progress Notes (Signed)
PROGRESS NOTE  Phillip Chavez EML:[id number] DOB: [birth year] DOA: 01/03/2021 PCP: [facility]  HPI/Recap of past 80 hours: 80 year old male with past medical history of hypertension, CAD, COPD and hyperlipidemia admitted on 11/5 for shortness of breath that have been going on for over a week prior.  Patient found to have sepsis on admission secondary to pneumonia with positive blood cultures for Enterococcus.  Complicating matters were that patient contracted COVID wife during hospitalization and developed worsening respiratory failure on 11/11 requiring BiPAP and placement in stepdown unit.  Found to also have moderate to severe aortic regurg with pulmonary edema.  Acute kidney injury worsened with attempted diuresis and patient started on temporary dialysis.  Patient also has issues with nightly sundowning.  Patient continues to improve.  Oxygen weaned down to 1.5 L.  Patient completed 10-day isolation period for COVID on 11/21.  Plan for vascular surgery to place PermCath today.  Currently pt resting comfortably.  He had some hypotension after HD yesterday and was quite exhausted.  Assessment/Plan: Principal Problem:   Acute respiratory failure with hypoxia ([place]): Secondary to COVID infection, community-acquired pneumonia and volume overload from severe aortic regurgitation: Continue diuresis.  Has completed IV antibiotics, Remdisivir.  Have been able to wean down oxygen from 5 L down to 1.5 L.  Wean off steroids.  Active Problems: Enterococcus sepsis ([place])  Met criteria for sepsis on admission given leukocytosis and heart rate greater than 90.  Appreciate infectious disease help.  Sepsis is since resolved.  Completed IV Zosyn course.  Enterococcus not felt to be associated with his pneumonia.        GERD (gastroesophageal reflux disease): Continue PPI.    HTN (hypertension): Holding Cozaar and metoprolol due to low episodes of blood pressure.    HLD (hyperlipidemia):  Continue statin.    Elevated troponin: Felt to be demand ischemia, likely from sepsis/poor valvular function.    AKI (acute kidney injury) ([place]) in the setting of stage IIIb chronic kidney disease: Appreciate nephrology help.  Considering PermCath when he is off isolation, which will be after 11/21.  Vascular surgery plans to place permacath.  Need to also ensure pt can tolerate sitting in dialysis chair for extended period of time.  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  Continue to follow.    Aortic valve regurgitation    COVID-19 virus infection: Completed IV Remdisivir.   Code Status: DNR  Family Communication: Updated wife by phone.  Disposition Plan: Once permacath cleared by general surgery for use and patient's outpatient dialysis schedule set, can look at discharge potentially tomorrow.   Consultants: Palliative care Nephrology Vascular surgery  Procedures: 2D echo: Preserved ejection fraction, severe moderate to severe aortic regurg, no evidence of diastolic dysfunction TEE: No evidence of endocarditis Continued hemodialysis  Antimicrobials: IV cefepime and vancomycin 11/5-11/11 IV Zosyn 11/11-11/19 IV Remdisivir 11/14-11/18  DVT prophylaxis: Subcu heparin  Level of care: Med-Surg   Objective: Vitals:   01/20/21 0414 01/20/21 0802  BP: (!) 147/40 (!) 134/38  Pulse: 76 67  Resp: 16 18  Temp: 99.4 F (37.4 C) 98.6 F (37 C)  SpO2: 92% 93%    Intake/Output Summary (Last 24 hours) at 01/20/2021 0940 Last data filed at 01/20/2021 0419 Gross per 24 hour  Intake 480.28 ml  Output -458 ml  Net 938.28 ml  Filed Weights   01/19/21 1025 01/19/21 1415 01/20/21 0419  Weight: 69.6 kg 68 kg 68 kg   Body mass index is 24.95 kg/m.  Exam:  General: Alert and oriented x2, no acute distress, resting  comfortably HEENT: Normocephalic and atraumatic, mucous membranes are Cardiovascular: Regular rate and rhythm, S1-S2 Respiratory: Clear to auscultation bilaterally Abdomen: Soft, nontender, nondistended, hypoactive bowel sounds Musculoskeletal: No clubbing or cyanosis, 1+ pitting edema Psychiatry: Appropriate, no evidence of psychoses   Data Reviewed: CBC: Recent Labs  Lab 01/14/21 0445 01/15/21 0856 01/16/21 0849 01/17/21 0438  WBC 10.5 13.5* 4.4 13.2*  HGB 10.1* 10.6* 10.3* 11.5*  HCT 29.7* 32.2* 32.6* 34.5*  MCV 85.8 87.0 99.1 85.0  PLT 269 247 204 859    Basic Metabolic Panel: Recent Labs  Lab 01/14/21 0445 01/15/21 0856 01/16/21 0849 01/17/21 0438 01/18/21 0441 01/19/21 0421 01/20/21 0234  NA 137 136 137 136 135 133* 132*  K 4.5 4.1 4.3 3.8 4.3  4.3 4.7 4.6  CL 99 96* 104 98 96* 96* 95*  CO2 _0 GLUCOSE 156* 131* 105* 107* 123* 115* 140*  BUN 89* 72* 28* 59* 100* 117* 61*  CREATININE 6.62* 6.26* 1.15 5.19* 7.05* 7.86* 4.78*  CALCIUM 7.9* 7.8* 8.6* 7.8* 7.9* 7.8* 7.8*  MG 2.7* 2.5* 2.1 2.2 2.4  --   --     GFR: Estimated Creatinine Clearance: 10.4 mL/min (A) (by C-G formula based on SCr of 4.78 mg/dL (H)). Liver Function Tests: No results for input(s): AST, ALT, ALKPHOS, BILITOT, PROT, ALBUMIN in the last 168 hours.  No results for input(s): LIPASE, AMYLASE in the last 168 hours. No results for input(s): AMMONIA in the last 168 hours. Coagulation Profile: No results for input(s): INR, PROTIME in the last 168 hours. Cardiac Enzymes: No results for input(s): CKTOTAL, CKMB, CKMBINDEX, TROPONINI in the last 168 hours. BNP (last 3 results) No results for input(s): PROBNP in the last 8760 hours. HbA1C: No results for input(s): HGBA1C in the last 72 hours. CBG: Recent Labs  Lab 01/15/21 1958 01/17/21 0953 01/17/21 1242 01/17/21 1622 01/17/21 2049  GLUCAP 126* 104* 175* 201* 160*    Lipid Profile: No results for input(s): CHOL,  HDL, LDLCALC, TRIG, CHOLHDL, LDLDIRECT in the last 72 hours. Thyroid Function Tests: No results for input(s): TSH, T4TOTAL, FREET4, T3FREE, THYROIDAB in the last 72 hours. Anemia Panel: No results for input(s): VITAMINB12, FOLATE, FERRITIN, TIBC, IRON, RETICCTPCT in the last 72 hours.  Urine analysis:    Component Value Date/Time   COLORURINE YELLOW (A) 01/09/2021 0952   APPEARANCEUR CLOUDY (A) 01/09/2021 0952   LABSPEC 1.023 01/09/2021 0952   PHURINE 5.0 01/09/2021 0952   GLUCOSEU 50 (A) 01/09/2021 0952   HGBUR MODERATE (A) 01/09/2021 0952   BILIRUBINUR NEGATIVE 01/09/2021 0952   KETONESUR 5 (A) 01/09/2021 0952   PROTEINUR 30 (A) 01/09/2021 0952   NITRITE NEGATIVE 01/09/2021 0952   LEUKOCYTESUR NEGATIVE 01/09/2021 0952   Sepsis Labs: _1 (procalcitonin:4,lacticidven:4)  ) Recent Results (from the past 240 hour(s))  Resp Panel by RT-PCR (Flu A&B, Covid) Nasopharyngeal Swab     Status: Abnormal   Collection Time: 01/10/21 10:46 PM   Specimen: Nasopharyngeal Swab; Nasopharyngeal(NP) swabs in vial transport medium  Result Value Ref Range Status   SARS Coronavirus 2 by RT PCR POSITIVE (A) NEGATIVE Final    Comment: RESULT CALLED TO, READ BACK BY AND VERIFIED WITH: ERICKA MCMILLIAN @ 0025 01/11/21 LFD (NOTE) SARS-CoV-2 target nucleic acids are DETECTED.  The SARS-CoV-2 RNA is generally detectable in upper respiratory specimens during the acute phase of infection. Positive results are indicative of the presence of the identified virus, but do not rule out bacterial infection or co-infection with other pathogens not detected by the test. Clinical correlation with patient history and other diagnostic information is necessary to determine patient infection status. The expected result is Negative.  Fact Sheet for Patients: EntrepreneurPulse.com.au  Fact Sheet for Healthcare Providers: IncredibleEmployment.be  This test is not yet  approved or cleared by the [place] FDA and  has been authorized for detection and/or diagnosis of SARS-CoV-2 by FDA under an Emergency Use Authorization (EUA).  This EUA will remain in effect (meaning this test can  be used) for the duration of  the COVID-19 declaration under Section 564(b)(1) of the Act, 21 U.S.C. section 360bbb-3(b)(1), unless the authorization is terminated or revoked sooner.     Influenza A by PCR NEGATIVE NEGATIVE Final   Influenza B by PCR NEGATIVE NEGATIVE Final    Comment: (NOTE) The Xpert Xpress SARS-CoV-2/FLU/RSV plus assay is intended as an aid in the diagnosis of influenza from Nasopharyngeal swab specimens and should not be used as a sole basis for treatment. Nasal washings and aspirates are unacceptable for Xpert Xpress SARS-CoV-2/FLU/RSV testing.  Fact Sheet for Patients: EntrepreneurPulse.com.au  Fact Sheet for Healthcare Providers: IncredibleEmployment.be  This test is not yet approved or cleared by the [place] FDA and has been authorized for detection and/or diagnosis of SARS-CoV-2 by FDA under an Emergency Use Authorization (EUA). This EUA will remain in effect (meaning this test can be used) for the duration of the COVID-19 declaration under Section 564(b)(1) of the Act, 21 U.S.C. section 360bbb-3(b)(1), unless the authorization is terminated or revoked.  Performed at [facility], [address] [place], [place] [address]       Studies: No results found.  Scheduled Meds:  vitamin C  500 mg Oral Daily   aspirin EC  81 mg Oral Daily   Chlorhexidine Gluconate Cloth  6 each Topical Daily   cholecalciferol  1,000 Units Oral Daily   feeding supplement (NEPRO CARB STEADY)  237 mL Oral TID BM   ferrous sulfate  325 mg Oral Q breakfast   heparin injection (subcutaneous)  5,000 Units Subcutaneous Q8H   ipratropium  2 puff Inhalation TID   levalbuterol  2 puff Inhalation TID    multivitamin  1 tablet Oral QHS   multivitamin with minerals  1 tablet Oral Daily   omega-3 acid ethyl esters  1 g Oral Daily   pantoprazole  40 mg Oral QHS   polyethylene glycol  17 g Oral BID   predniSONE  50 mg Oral Daily   rosuvastatin  10 mg Oral QHS   zinc sulfate  220 mg Oral Daily    Continuous Infusions:  sodium chloride Stopped (01/18/21 0603)     LOS: 17 days     Annita Brod, MD [facility]   01/20/2021, 9:40 AM

## 2021-01-20 NOTE — Progress Notes (Signed)
[facility]  PROGRESS NOTE   Subjective:   Patient seen resting in bed Alert and oriented Daughter at bedside Currently NPO for Permcath placement  Objective:  Vital signs in last 24 hours:  Temp:  [98 F (36.7 C)-99.4 F (37.4 C)] 98.6 F (37 C) (11/22 0802) Pulse Rate:  [67-76] 67 (11/22 0802) Resp:  [16-18] 18 (11/22 0802) BP: (134-151)/(29-41) 134/38 (11/22 0802) SpO2:  [92 %-96 %] 93 % (11/22 0802) Weight:  [68 kg] 68 kg (11/22 0419)  Weight change:  Filed Weights   01/19/21 1025 01/19/21 1415 01/20/21 0419  Weight: 69.6 kg 68 kg 68 kg    Intake/Output: I/O last 3 completed shifts: In: 727.3 [P.O.:727; I.V.:0.3] Out: -158 [Urine:925]   Intake/Output this shift:  No intake/output data recorded.  Physical Exam: General:  No acute distress, laying in bed  Head:  Normocephalic, atraumatic. Moist oral mucosal membranes  Lungs:   Clear to auscultation, 1.5 L [place] O2.   Heart:  Regular  rate and rhythm  Abdomen:   Soft, nontender  Extremities:  + peripheral edema.  Neurologic:  Awake and alert  Skin:  No lesions  Access: Right femoral temp HD catheter 25/95    Basic Metabolic Panel: Recent Labs  Lab 01/14/21 0445 01/15/21 0856 01/16/21 0849 01/17/21 0438 01/18/21 0441 01/19/21 0421 01/20/21 0234  NA 137 136 137 136 135 133* 132*  K 4.5 4.1 4.3 3.8 4.3  4.3 4.7 4.6  CL 99 96* 104 98 96* 96* 95*  CO2 22 22 27 26 24 22 26   GLUCOSE 156* 131* 105* 107* 123* 115* 140*  BUN 89* 72* 28* 59* 100* 117* 61*  CREATININE 6.62* 6.26* 1.15 5.19* 7.05* 7.86* 4.78*  CALCIUM 7.9* 7.8* 8.6* 7.8* 7.9* 7.8* 7.8*  MG 2.7* 2.5* 2.1 2.2 2.4  --   --      CBC: Recent Labs  Lab 01/14/21 0445 01/15/21 0856 01/16/21 0849 01/17/21 0438 01/20/21 0234  WBC 10.5 13.5* 4.4 13.2* 11.8*  HGB 10.1* 10.6* 10.3* 11.5* 11.2*  HCT 29.7* 32.2* 32.6* 34.5* 32.8*  MCV 85.8 87.0 99.1 85.0 85.0  PLT 269 247 204 209 149*      Urinalysis: No results for input(s):  COLORURINE, LABSPEC, PHURINE, GLUCOSEU, HGBUR, BILIRUBINUR, KETONESUR, PROTEINUR, UROBILINOGEN, NITRITE, LEUKOCYTESUR in the last 72 hours.  Invalid input(s): APPERANCEUR     Imaging: No results found.   Medications:    sodium chloride Stopped (01/18/21 0603)    vitamin C  500 mg Oral Daily   aspirin EC  81 mg Oral Daily   Chlorhexidine Gluconate Cloth  6 each Topical Daily   cholecalciferol  1,000 Units Oral Daily   feeding supplement (NEPRO CARB STEADY)  237 mL Oral TID BM   ferrous sulfate  325 mg Oral Q breakfast   heparin injection (subcutaneous)  5,000 Units Subcutaneous Q8H   ipratropium  2 puff Inhalation TID   levalbuterol  2 puff Inhalation TID   multivitamin  1 tablet Oral QHS   multivitamin with minerals  1 tablet Oral Daily   omega-3 acid ethyl esters  1 g Oral Daily   pantoprazole  40 mg Oral QHS   polyethylene glycol  17 g Oral BID   predniSONE  20 mg Oral Daily   rosuvastatin  10 mg Oral QHS   zinc sulfate  220 mg Oral Daily    Assessment/ Plan:     Principal Problem:   Acute respiratory failure with hypoxia ([facility]) Active Problems:  CAP (community acquired pneumonia)   Sepsis ([place])   COPD (chronic obstructive pulmonary disease) ([facility])   GERD (gastroesophageal reflux disease)   HTN (hypertension)   HLD (hyperlipidemia)   Elevated troponin   AKI (acute kidney injury) ([place])   Normocytic anemia   Aortic valve regurgitation   COVID-19 virus infection  [name] is a 80 y.o. white male with hypertension, coronary artery disease, congestive heart failure, COPD, GERD, hyperlipidemia, admitted to [facility] on CAP (community acquired pneumonia) [J18.9] AKI (acute kidney injury) ([place]) [N17.9] Community acquired pneumonia, unspecified laterality [J18.9] Congestive heart failure, unspecified HF chronicity, unspecified heart failure type ([place]) [I50.9]    #1: Acute kidney injury with hyperkalemia: on chronic kidney disease stage IIIB, baseline 1.64, GFR of  42 on admission.  Requiring hemodialysis. Resistant to furosemide.  Last hemodialysis treatment was Monday, 11/21 - Will plan for dialysis on Wednesday - Minimal recovery noted. Will proceed with permcath placement today. Once placed, will d/c temp cath.   #2. Anemia with chronic kidney disease: hemoglobin 11.2. No indication for ESA.   #3. Sepsis: E. Faecalis on blood cultures on 11/5.  Completed course of IV pip/tazo.   - Appreciate ID input.   #4. Acute respiratory failure with COVID-19 infection and pulmonary edema. Oxygen requirements have improved    LOS: [facility] 11/22/20222:11 PM

## 2021-01-20 NOTE — Progress Notes (Signed)
PROGRESS NOTE  Phillip Chavez GYI:[id number] DOB: 01/26/1941 DOA: 01/03/2021 PCP: [facility]  HPI/Recap of past 75 hours: 80 year old male with past medical history of hypertension, CAD, COPD and hyperlipidemia admitted on 11/5 for shortness of breath that have been going on for over a week prior.  Patient found to have sepsis on admission secondary to pneumonia with positive blood cultures for Enterococcus.  Complicating matters were that patient contracted COVID wife during hospitalization and developed worsening respiratory failure on 11/11 requiring BiPAP and placement in stepdown unit.  Found to also have moderate to severe aortic regurg with pulmonary edema.  Acute kidney injury worsened with attempted diuresis and patient started on temporary dialysis.  Patient also has issues with nightly sundowning.  Patient continues to improve.  Oxygen weaned down to 1.5 L.  Patient completed 10-day isolation period for COVID on 11/21.  Plan for vascular surgery to place PermCath today.  Currently pt resting comfortably.  He had some hypotension after HD yesterday and was quite exhausted.  Assessment/Plan: Principal Problem:   Acute respiratory failure with hypoxia ([place]): Secondary to COVID infection, community-acquired pneumonia and volume overload from severe aortic regurgitation: Continue diuresis.  Has completed IV antibiotics, Remdisivir.  Have been able to wean down oxygen from 5 L down to 1.5 L.  Wean off steroids.  Active Problems: Enterococcus sepsis ([place])  Met criteria for sepsis on admission given leukocytosis and heart rate greater than 90.  Appreciate infectious disease help.  Sepsis is since resolved.  Completed IV Zosyn course.  Enterococcus not felt to be associated with his pneumonia.        GERD (gastroesophageal reflux disease): Continue PPI.    HTN (hypertension): Holding Cozaar and metoprolol due to low episodes of blood pressure.    HLD (hyperlipidemia):  Continue statin.    Elevated troponin: Felt to be demand ischemia, likely from sepsis/poor valvular function.    AKI (acute kidney injury) ([place]) in the setting of stage IIIb chronic kidney disease: Appreciate nephrology help.  Considering PermCath when he is off isolation, which will be after 11/21.  Vascular surgery plans to place permacath.  Need to also ensure pt can tolerate sitting in dialysis chair for extended period of time.  Should be able to get approval from the [place] for outpatient dialysis starting next Monday.  Therefore, patient can be discharged this Friday after dialysis here.    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  Continue to follow.    Aortic valve regurgitation    COVID-19 virus infection: Completed IV Remdisivir.   Code Status: DNR  Family Communication: Updated wife by phone.  Disposition Plan: Once permacath cleared by general surgery for use and patient's outpatient dialysis schedule set, can look at discharge hopefully after dialysis on Friday   Consultants: Palliative care Nephrology Vascular surgery  Procedures: 2D echo: Preserved ejection fraction, severe moderate to severe aortic regurg, no evidence of diastolic dysfunction TEE: No evidence of endocarditis Continued hemodialysis  Antimicrobials: IV cefepime and vancomycin 11/5-11/11 IV Zosyn 11/11-11/19 IV Remdisivir 11/14-11/18  DVT prophylaxis: Subcu heparin  Level of care: Med-Surg   Objective: Vitals:   01/20/21 0802 01/20/21 1512  BP: (!) 134/38 (!) 152/33  Pulse: 67 75  Resp: 18 18  Temp: 98.6 F (37 C) 98.5 F (36.9 C)  SpO2: 93% 91%    Intake/Output Summary (Last 24 hours) at 01/20/2021 1641 Last data filed at 01/20/2021 1500 Gross per 24 hour  Intake 240 ml  Output 625  ml  Net -385 ml    Filed Weights   01/19/21 1025 01/19/21 1415 01/20/21 0419  Weight: 69.6 kg 68 kg 68 kg   Body mass index is 24.95 kg/m.  Exam:  General: Alert and oriented x2,  no acute distress, resting comfortably HEENT: Normocephalic and atraumatic, mucous membranes are Cardiovascular: Regular rate and rhythm, S1-S2 Respiratory: Clear to auscultation bilaterally Abdomen: Soft, nontender, nondistended, hypoactive bowel sounds Musculoskeletal: No clubbing or cyanosis, 1+ pitting edema Psychiatry: Appropriate, no evidence of psychoses   Data Reviewed: CBC: Recent Labs  Lab 01/14/21 0445 01/15/21 0856 01/16/21 0849 01/17/21 0438 01/20/21 0234  WBC 10.5 13.5* 4.4 13.2* 11.8*  HGB 10.1* 10.6* 10.3* 11.5* 11.2*  HCT 29.7* 32.2* 32.6* 34.5* 32.8*  MCV 85.8 87.0 99.1 85.0 85.0  PLT 269 247 204 209 149*    Basic Metabolic Panel: Recent Labs  Lab 01/14/21 0445 01/15/21 0856 01/16/21 0849 01/17/21 0438 01/18/21 0441 01/19/21 0421 01/20/21 0234  NA 137 136 137 136 135 133* 132*  K 4.5 4.1 4.3 3.8 4.3  4.3 4.7 4.6  CL 99 96* 104 98 96* 96* 95*  CO2 '22 22 27 26 24 22 26  ' GLUCOSE 156* 131* 105* 107* 123* 115* 140*  BUN 89* 72* 28* 59* 100* 117* 61*  CREATININE 6.62* 6.26* 1.15 5.19* 7.05* 7.86* 4.78*  CALCIUM 7.9* 7.8* 8.6* 7.8* 7.9* 7.8* 7.8*  MG 2.7* 2.5* 2.1 2.2 2.4  --   --     GFR: Estimated Creatinine Clearance: 10.4 mL/min (A) (by C-G formula based on SCr of 4.78 mg/dL (H)). Liver Function Tests: No results for input(s): AST, ALT, ALKPHOS, BILITOT, PROT, ALBUMIN in the last 168 hours.  No results for input(s): LIPASE, AMYLASE in the last 168 hours. No results for input(s): AMMONIA in the last 168 hours. Coagulation Profile: No results for input(s): INR, PROTIME in the last 168 hours. Cardiac Enzymes: No results for input(s): CKTOTAL, CKMB, CKMBINDEX, TROPONINI in the last 168 hours. BNP (last 3 results) No results for input(s): PROBNP in the last 8760 hours. HbA1C: No results for input(s): HGBA1C in the last 72 hours. CBG: Recent Labs  Lab 01/15/21 1958 01/17/21 0953 01/17/21 1242 01/17/21 1622 01/17/21 2049  GLUCAP 126* 104*  175* 201* 160*    Lipid Profile: No results for input(s): CHOL, HDL, LDLCALC, TRIG, CHOLHDL, LDLDIRECT in the last 72 hours. Thyroid Function Tests: No results for input(s): TSH, T4TOTAL, FREET4, T3FREE, THYROIDAB in the last 72 hours. Anemia Panel: No results for input(s): VITAMINB12, FOLATE, FERRITIN, TIBC, IRON, RETICCTPCT in the last 72 hours.  Urine analysis:    Component Value Date/Time   COLORURINE YELLOW (A) 01/09/2021 0952   APPEARANCEUR CLOUDY (A) 01/09/2021 0952   LABSPEC 1.023 01/09/2021 0952   PHURINE 5.0 01/09/2021 0952   GLUCOSEU 50 (A) 01/09/2021 0952   HGBUR MODERATE (A) 01/09/2021 0952   BILIRUBINUR NEGATIVE 01/09/2021 0952   KETONESUR 5 (A) 01/09/2021 0952   PROTEINUR 30 (A) 01/09/2021 0952   NITRITE NEGATIVE 01/09/2021 0952   LEUKOCYTESUR NEGATIVE 01/09/2021 0952   Sepsis Labs: '@LABRCNTIP' (procalcitonin:4,lacticidven:4)  ) Recent Results (from the past 240 hour(s))  Resp Panel by RT-PCR (Flu A&B, Covid) Nasopharyngeal Swab     Status: Abnormal   Collection Time: 01/10/21 10:46 PM   Specimen: Nasopharyngeal Swab; Nasopharyngeal(NP) swabs in vial transport medium  Result Value Ref Range Status   SARS Coronavirus 2 by RT PCR POSITIVE (A) NEGATIVE Final    Comment: RESULT CALLED TO, READ BACK BY AND  VERIFIED WITH: ERICKA MCMILLIAN @ 0025 01/11/21 LFD (NOTE) SARS-CoV-2 target nucleic acids are DETECTED.  The SARS-CoV-2 RNA is generally detectable in upper respiratory specimens during the acute phase of infection. Positive results are indicative of the presence of the identified virus, but do not rule out bacterial infection or co-infection with other pathogens not detected by the test. Clinical correlation with patient history and other diagnostic information is necessary to determine patient infection status. The expected result is Negative.  Fact Sheet for Patients: EntrepreneurPulse.com.au  Fact Sheet for Healthcare  Providers: IncredibleEmployment.be  This test is not yet approved or cleared by the [place] FDA and  has been authorized for detection and/or diagnosis of SARS-CoV-2 by FDA under an Emergency Use Authorization (EUA).  This EUA will remain in effect (meaning this test can  be used) for the duration of  the COVID-19 declaration under Section 564(b)(1) of the Act, 21 U.S.C. section 360bbb-3(b)(1), unless the authorization is terminated or revoked sooner.     Influenza A by PCR NEGATIVE NEGATIVE Final   Influenza B by PCR NEGATIVE NEGATIVE Final    Comment: (NOTE) The Xpert Xpress SARS-CoV-2/FLU/RSV plus assay is intended as an aid in the diagnosis of influenza from Nasopharyngeal swab specimens and should not be used as a sole basis for treatment. Nasal washings and aspirates are unacceptable for Xpert Xpress SARS-CoV-2/FLU/RSV testing.  Fact Sheet for Patients: EntrepreneurPulse.com.au  Fact Sheet for Healthcare Providers: IncredibleEmployment.be  This test is not yet approved or cleared by the [place] FDA and has been authorized for detection and/or diagnosis of SARS-CoV-2 by FDA under an Emergency Use Authorization (EUA). This EUA will remain in effect (meaning this test can be used) for the duration of the COVID-19 declaration under Section 564(b)(1) of the Act, 21 U.S.C. section 360bbb-3(b)(1), unless the authorization is terminated or revoked.  Performed at [facility], [address], [place], [place] [address]       Studies: No results found.  Scheduled Meds:  vitamin C  500 mg Oral Daily   aspirin EC  81 mg Oral Daily   Chlorhexidine Gluconate Cloth  6 each Topical Daily   cholecalciferol  1,000 Units Oral Daily   feeding supplement (NEPRO CARB STEADY)  237 mL Oral TID BM   ferrous sulfate  325 mg Oral Q breakfast   heparin injection (subcutaneous)  5,000 Units Subcutaneous Q8H    ipratropium  2 puff Inhalation TID   levalbuterol  2 puff Inhalation TID   multivitamin  1 tablet Oral QHS   multivitamin with minerals  1 tablet Oral Daily   omega-3 acid ethyl esters  1 g Oral Daily   pantoprazole  40 mg Oral QHS   polyethylene glycol  17 g Oral BID   predniSONE  20 mg Oral Daily   rosuvastatin  10 mg Oral QHS   zinc sulfate  220 mg Oral Daily    Continuous Infusions:  sodium chloride Stopped (01/18/21 0603)   sodium chloride     vancomycin       LOS: 17 days     Annita Brod, MD [facility]   01/20/2021, 4:41 PM

## 2021-01-20 NOTE — Op Note (Signed)
OPERATIVE NOTE    PRE-OPERATIVE DIAGNOSIS: 1. ESRD   POST-OPERATIVE DIAGNOSIS: same as above  PROCEDURE: Ultrasound guidance for vascular access to the right internal jugular vein Fluoroscopic guidance for placement of catheter Placement of a 19 cm tip to cuff tunneled hemodialysis catheter via the right internal jugular vein  SURGEON: Leotis Pain, MD  ANESTHESIA:  Local with Moderate conscious sedation for approximately 17 minutes using 1 mg of Versed and 25 mcg of Fentanyl  ESTIMATED BLOOD LOSS: 3 cc  FLUORO TIME: less than one minute  CONTRAST: none  FINDING(S): 1.  Patent right internal jugular vein  SPECIMEN(S):  None  INDICATIONS:   Phillip Chavez is a 80 y.o.male who presents with renal failure.  The patient needs long term dialysis access for their ESRD, and a Permcath is necessary.  Risks and benefits are discussed and informed consent is obtained.    DESCRIPTION: After obtaining full informed written consent, the patient was brought back to the vascular suited. The patient's right neck and chest were sterilely prepped and draped in a sterile surgical field was created. Moderate conscious sedation was administered during a face to face encounter with the patient throughout the procedure with my supervision of the RN administering medicines and monitoring the patient's vital signs, pulse oximetry, telemetry and mental status throughout from the start of the procedure until the patient was taken to the recovery room.  The right internal jugular vein was visualized with ultrasound and found to be patent. It was then accessed under direct ultrasound guidance and a permanent image was recorded. A wire was placed. After skin nick and dilatation, the peel-away sheath was placed over the wire. I then turned my attention to an area under the clavicle. Approximately 1-2 fingerbreadths below the clavicle a small counterincision was created and tunneled from the subclavicular incision to  the access site. Using fluoroscopic guidance, a 19 centimeter tip to cuff tunneled hemodialysis catheter was selected, and tunneled from the subclavicular incision to the access site. It was then placed through the peel-away sheath and the peel-away sheath was removed. Using fluoroscopic guidance the catheter tips were parked in the right atrium. The appropriate distal connectors were placed. It withdrew blood well and flushed easily with heparinized saline and a concentrated heparin solution was then placed. It was secured to the chest wall with 2 Prolene sutures. The access incision was closed single 4-0 Monocryl. A 4-0 Monocryl pursestring suture was placed around the exit site. Sterile dressings were placed. The patient tolerated the procedure well and was taken to the recovery room in stable condition.  COMPLICATIONS: None  CONDITION: Stable  Leotis Pain, MD 01/20/2021 6:17 PM   This note was created with Dragon Medical transcription system. Any errors in dictation are purely unintentional.

## 2021-01-20 NOTE — Progress Notes (Signed)
Palliative Care Progress Note, Assessment & Plan   Patient Name: Phillip Chavez       Date: 01/20/2021 DOB: February 08, 1941  Age: 80 y.o. MRN#: [id number] Attending Physician: Annita Brod, MD Primary Care Physician: [facility] Admit Date: 01/03/2021  Reason for Consultation/Follow-up: Establishing goals of care  Subjective: Patient is sitting in bed with nasal cannula in place in no apparent distress.  Stepdaughter is at bedside.  Patient has no acute complaints.  HPI: Patient is an 80 year old male that presented to the hospital with shortness of breath.  He is medical history is significant for HTN, CAD, CHF, COPD, GERD, HLD, and moderate to severe aortic regurgitation.  During his hospitalization he developed worsening respiratory failure requiring BiPAP but is now on 5 L of nasal cannula.  He is also found to be COVID-positive on 11/12.  He is in acute kidney injury with possible pulmonary edema. Creatinine continues to increase despite hemodialysis.  Summary of counseling/coordination of care: After reviewing the patient's chart, epic notes, labs, and imaging, I met with the patient at bedside.  He does not remember our previous discussions.  Introduced palliative medicine to his stepdaughter as an extra layer support for both the family and patient who are facing advance care planning decisions, medical decisions, and navigating medical plans with chronic diseases.  I confirmed that patient wants to continue with hemodialysis outpatient.  I gave a brief explanation of creatinine, its effects on the body in mind, and what all outpatient hemodialysis entails.  The patient continues to state he just wants to get better.  His stepdaughter asked when he would be discharged.  I reiterated  that the patient's plan of care is in his hands and that the medical staff policy making recommendations.  Again reviewed the risk and benefits of continuing outpatient hemodialysis as well as the risk and benefits of stopping hemodialysis and focus on a comfort pathway.  Therapeutic silence and space provided for patient and stepdaughter to share their thoughts and emotions regarding his current health status.  Questions and concerns were addressed.  Outpatient palliative services were reviewed and discussed.  Both patient and stepdaughter were in agreement that an extra layer of support while in the community would be useful.  I sure that the palliative medicine team is always available we will continue to shadow the patient throughout his hospitalization.  We will not become overly involved or intervene unless the patient and her family requests her inputs or the patient declines.    Code Status: DNR  Prognosis: Unable to determine  Discharge Planning: Home with Palliative Services  Recommendations/Plan: Outpatient palliative services  Care plan was discussed with patient, patient's stepdaughter  Physical Exam Vitals and nursing note reviewed.  Constitutional:      General: He is not in acute distress.    Appearance: He is not ill-appearing.  HENT:     Head: Normocephalic and atraumatic.  Cardiovascular:     Rate and Rhythm: Normal rate.  Pulmonary:     Effort: Pulmonary effort is normal.  Chest:     Chest wall: No mass or tenderness.  Abdominal:     Palpations: Abdomen is soft.  Skin:  General: Skin is warm and dry.  Neurological:     Mental Status: He is alert and oriented to person, place, and time.  Psychiatric:        Mood and Affect: Mood normal. Mood is not anxious.        Behavior: Behavior normal. Behavior is not agitated.            Flowsheet Rows    Flowsheet Row Most Recent Value  Intake Tab   Referral Department Critical care  Unit at Time of  Referral ICU  Palliative Care Primary Diagnosis Pulmonary  Date Notified 01/10/21  Palliative Care Type New Palliative care  Reason for referral Clarify Goals of Care  Date of Admission 01/03/21  Date first seen by Palliative Care 01/13/21  # of days Palliative referral response time 3 Day(s)  # of days IP prior to Palliative referral 7  Clinical Assessment   Psychosocial & Spiritual Assessment   Palliative Care Outcomes         Total Time 25 minutes  Greater than 50%  of this time was spent counseling and coordinating care related to the above assessment and plan.  Thank you for allowing the Palliative Medicine Team to assist in the care of this patient.  Raiford Ilsa Iha, FNP-BC Palliative Medicine Team Team Phone # [phone]

## 2021-01-20 NOTE — TOC Progression Note (Signed)
Transition of Care [facility]) - Progression Note    Patient Details  Name: Phillip Chavez MRN: [id number] Date of Birth: 1940-04-26  [facility] of [facility] CM/SW Contact  Eileen Stanford, LCSW Phone Number: 01/20/2021, 4:11 PM  Clinical Narrative:  Per Estill B[facility]berg HD Coordinator, pt should get [facility] auth to start HD Friday, therefore pt can't dc until Friday after HD. After that pt can get HD the following Monday at the center.     Expected Discharge Plan: Skilled Nursing Facility Barriers to Discharge: Waiting for outpatient dialysis  Expected Discharge Plan and Services Expected Discharge Plan: [place] Choice: [place] arrangements for the past 2 months: Single Family Home                 DME Arranged: [place] rolling DME Agency: [employer]       HH Arranged: PT, RN [place] Agency: [place] ([facility]) Date HH Agency Contacted: 01/07/21 Time [place]: [id number] Representative spoke with at [place]: [place] ([place]) Interventions    Readmission Risk Interventions No flowsheet data found.

## 2021-01-21 ENCOUNTER — Encounter: Payer: Self-pay | Admitting: Vascular Surgery

## 2021-01-21 DIAGNOSIS — J9601 Acute respiratory failure with hypoxia: Secondary | ICD-10-CM | POA: Diagnosis not present

## 2021-01-21 MED ORDER — HEPARIN SODIUM (PORCINE) 1000 UNIT/ML IJ SOLN
INTRAMUSCULAR | Status: AC
  Filled 2021-01-21: qty 1

## 2021-01-21 MED ORDER — ACETAMINOPHEN 500 MG PO TABS
1000.0000 mg | ORAL_TABLET | Freq: Once | ORAL | Status: AC
  Administered 2021-01-21: 1000 mg via ORAL
  Filled 2021-01-21: qty 2

## 2021-01-21 NOTE — Progress Notes (Signed)
PT Cancellation Note  Patient Details Name: YOGESH COMINSKY MRN: [id number] DOB: 1940/07/01   Cancelled Treatment:    Reason Eval/Treat Not Completed: Patient at procedure or test/unavailable (Consult received and chart reviewed. Patient currently off unit for dialysis.  Will re-attempt at later time/date as medically appropriate.)  Koron Godeaux H. Owens Shark, PT, DPT, NCS 01/21/21, 1:24 PM [phone]

## 2021-01-21 NOTE — Progress Notes (Signed)
Patient agitated and confused , states doesn't know the people taking care of him or what's happening to him.  He called his wife wanting her to come pick him up from hospital.  Wife and son called RN and had questions about patient's cognition.  Rn assured family patient was being taken care of and would let MD know they had questions/concerns.  Family was appreciative.

## 2021-01-21 NOTE — Progress Notes (Signed)
[facility]  PROGRESS NOTE   Subjective:   Patient seen and evaluated on dialysis   HEMODIALYSIS FLOWSHEET:  Blood Flow Rate (mL/min): 300 mL/min Arterial Pressure (mmHg): -250 mmHg Venous Pressure (mmHg): 250 mmHg Transmembrane Pressure (mmHg): 80 mmHg Ultrafiltration Rate (mL/min): 400 mL/min Dialysate Flow Rate (mL/min): 500 ml/min Conductivity: Machine : 14 Conductivity: Machine : 14 Dialysis Fluid Bolus: Normal Saline Bolus Amount (mL): 250 mL  Per nursing, patient was refusing dialysis. States his right neck was sore from line placement.   Agreeable to dialysis once Tylenol given  Objective:  Vital signs in last 24 hours:  Temp:  [98 F (36.7 C)-98.9 F (37.2 C)] 98.2 F (36.8 C) (11/23 0950) Pulse Rate:  [71-79] 79 (11/23 1200) Resp:  [11-19] 14 (11/23 1200) BP: (107-152)/(28-36) 138/28 (11/23 1200) SpO2:  [88 %-96 %] 96 % (11/23 0736) Weight:  [68 kg-68.2 kg] 68.2 kg (11/23 0343)  Weight change: -1.6 kg Filed Weights   01/20/21 0419 01/20/21 1712 01/21/21 0343  Weight: 68 kg 68 kg 68.2 kg    Intake/Output: I/O last 3 completed shifts: In: 0  Out: 725 [Urine:725]   Intake/Output this shift:  No intake/output data recorded.  Physical Exam: General:  No acute distress, laying in bed  Head:  Normocephalic, atraumatic. Moist oral mucosal membranes  Lungs:   Clear to auscultation, 1.5 L [place] O2.   Heart:  Regular  rate and rhythm  Abdomen:   Soft, nontender  Extremities:  + peripheral edema.  Neurologic:  Awake and alert  Skin:  No lesions  Access: Rt IJ Permcath placed on 25/42/70    Basic Metabolic Panel: Recent Labs  Lab 01/15/21 0856 01/16/21 0849 01/17/21 0438 01/18/21 0441 01/19/21 0421 01/20/21 0234  NA 136 137 136 135 133* 132*  K 4.1 4.3 3.8 4.3  4.3 4.7 4.6  CL 96* 104 98 96* 96* 95*  CO2 22 27 26 24 22 26   GLUCOSE 131* 105* 107* 123* 115* 140*  BUN 72* 28* 59* 100* 117* 61*  CREATININE 6.26* 1.15 5.19* 7.05* 7.86*  4.78*  CALCIUM 7.8* 8.6* 7.8* 7.9* 7.8* 7.8*  MG 2.5* 2.1 2.2 2.4  --   --      CBC: Recent Labs  Lab 01/15/21 0856 01/16/21 0849 01/17/21 0438 01/20/21 0234  WBC 13.5* 4.4 13.2* 11.8*  HGB 10.6* 10.3* 11.5* 11.2*  HCT 32.2* 32.6* 34.5* 32.8*  MCV 87.0 99.1 85.0 85.0  PLT 247 204 209 149*      Urinalysis: No results for input(s): COLORURINE, LABSPEC, PHURINE, GLUCOSEU, HGBUR, BILIRUBINUR, KETONESUR, PROTEINUR, UROBILINOGEN, NITRITE, LEUKOCYTESUR in the last 72 hours.  Invalid input(s): APPERANCEUR     Imaging: PERIPHERAL VASCULAR CATHETERIZATION  Result Date: 01/20/2021 See surgical note for result.    Medications:    sodium chloride Stopped (01/18/21 0603)    heparin sodium (porcine)       vitamin C  500 mg Oral Daily   aspirin EC  81 mg Oral Daily   Chlorhexidine Gluconate Cloth  6 each Topical Daily   Chlorhexidine Gluconate Cloth  6 each Topical Q0600   cholecalciferol  1,000 Units Oral Daily   feeding supplement (NEPRO CARB STEADY)  237 mL Oral TID BM   ferrous sulfate  325 mg Oral Q breakfast   heparin injection (subcutaneous)  5,000 Units Subcutaneous Q8H   ipratropium  2 puff Inhalation TID   levalbuterol  2 puff Inhalation TID   multivitamin  1 tablet Oral QHS   multivitamin with  minerals  1 tablet Oral Daily   omega-3 acid ethyl esters  1 g Oral Daily   pantoprazole  40 mg Oral QHS   polyethylene glycol  17 g Oral BID   rosuvastatin  10 mg Oral QHS   zinc sulfate  220 mg Oral Daily    Assessment/ Plan:     Principal Problem:   Acute respiratory failure with hypoxia (HCC) Active Problems:   CAP (community acquired pneumonia)   Sepsis ([place])   COPD (chronic obstructive pulmonary disease) (HCC)   GERD (gastroesophageal reflux disease)   HTN (hypertension)   HLD (hyperlipidemia)   Elevated troponin   AKI (acute kidney injury) ([place])   Normocytic anemia   Aortic valve regurgitation   COVID-19 virus infection  [name] is a 80  y.o. 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  Requiring hemodialysis. Resistant to furosemide.  Last hemodialysis treatment was Monday, 11/21 - Appreciate vascular placing permcath yesterday. Will use during dialysis session today. - Catheter has high pressures and BFR reduced to 250. - Temp cath removed yesterday.  - Dialysis coordinator has arranged outpatient treatment at [facility] with a start date of Monday, pending Va authorization.  - Will continue to monitor for renal recovery  #2. Anemia with chronic kidney disease: hemoglobin 11.2. No indication for ESA.   #3. Sepsis: E. Faecalis on blood cultures on 11/5.  Completed course of IV pip/tazo.   - Appreciate ID input.   #4. Acute respiratory failure with COVID-19 infection and pulmonary edema. Oxygen requirements have improved    LOS: [facility] 11/23/202212:15 PM

## 2021-01-21 NOTE — Progress Notes (Signed)
Patient was scheduled to come down for dialysis, but is refusing currently until he speaks with a doctor per primary nurse. Dr.Singh and Roque Cash made aware.

## 2021-01-21 NOTE — Progress Notes (Signed)
VA authorization for outpatient hemodialysis in progress. Approval papers should be faxed to [place] by Friday. Please call clinic on Friday 11/25 to verify that approval has been received. Ask for [facility] RN [phone]. Patient cannot start until authorization is completed. Chair time is MWF 7am.

## 2021-01-21 NOTE — TOC Progression Note (Signed)
Transition of Care [facility]) - Progression Note    Patient Details  Name: Phillip Chavez MRN: [id number] Date of Birth: [birth year]  Transition of Care [facility]) CM/SW Contact  Eileen Stanford, LCSW Phone Number: 01/21/2021, 4:10 PM  Clinical Narrative:   Per HD Coordinator Estill Bamberg, pt can start at the HD center this Friday. MD states she has ordered PT to eval pt to make sure he is still safe to dc home.    Expected Discharge Plan: Skilled Nursing Facility Barriers to Discharge: Waiting for outpatient dialysis  Expected Discharge Plan and Services Expected Discharge Plan: [place] Choice: [place] arrangements for the past 2 months: Single Family Home                 DME Arranged: [place] rolling DME Agency: [employer]       HH Arranged: PT, RN [place] Agency: [place] ([facility]) Date HH Agency Contacted: 01/07/21 Time [place]: 0909 Representative spoke with at [place]: [place] ([place]) Interventions    Readmission Risk Interventions No flowsheet data found.

## 2021-01-21 NOTE — Progress Notes (Signed)
PROGRESS NOTE    Phillip Chavez  ZDG:[id number] DOB: Sep 10, 1940 DOA: 01/03/2021 PCP: [facility]  [phone]   Assessment & Plan:   Principal Problem:   Acute respiratory failure with hypoxia ([place]) Active Problems:   CAP (community acquired pneumonia)   Sepsis ([place])   COPD (chronic obstructive pulmonary disease) (HCC)   GERD (gastroesophageal reflux disease)   HTN (hypertension)   HLD (hyperlipidemia)   Elevated troponin   AKI (acute kidney injury) ([place])   Normocytic anemia   Aortic valve regurgitation   COVID-19 virus infection   80 year old male with past medical history of hypertension, CAD, COPD and hyperlipidemia admitted on 11/5 for shortness of breath that have been going on for over a week prior.  Patient found to have sepsis on admission secondary to pneumonia with positive blood cultures for Enterococcus.  Complicating matters were that patient contracted COVID wife during hospitalization and developed worsening respiratory failure on 11/11 requiring BiPAP and placement in stepdown unit.  Found to also have moderate to severe aortic regurg with pulmonary edema.  Acute kidney injury worsened with attempted diuresis and patient started on temporary dialysis.  Patient also has issues with nightly sundowning.   Patient continues to improve.  Oxygen weaned down to 1.5 L.  Patient completed 10-day isolation period for COVID on 11/21.  vascular surgery placed PermCath.       Acute respiratory failure with hypoxia ([place]):  Secondary to COVID infection, community-acquired pneumonia and volume overload from severe aortic regurgitation, and COPD exacerbation. Has completed IV antibiotics, Remdisivir.  Have been able to wean down from BiPAP to 1.5 L.  completed steroid burst. --Continue supplemental O2 to keep sats >=90%, wean as tolerated  Enterococcus sepsis (HCC)   Met criteria for sepsis on admission given leukocytosis and heart rate greater than 90.  Appreciate  infectious disease help.  Sepsis is since resolved.  Completed IV Zosyn course.  Enterococcus not felt to be associated with his pneumonia.        GERD (gastroesophageal reflux disease):  Continue PPI.     HTN (hypertension):  --intermittent low BP --hold Cozaar and metop     HLD (hyperlipidemia):  Continue statin.     Elevated troponin: Felt to be demand ischemia, likely from sepsis/poor valvular function.     AKI (acute kidney injury) ([place]) in the setting of stage IIIb chronic kidney disease Oliguria   --started on dialysis.  permcath placed --Dialysis coordinator has arranged outpatient treatment at [facility]  --monitor for renal recovery     Normocytic anemia: Secondary to chronic renal disease. Iron def --monitor Hgb --cont oral iron suppl     Aortic valve regurgitation     COVID-19 virus infection: Completed IV Remdisivir.  Out of isolation.    DVT prophylaxis: Heparin SQ Code Status: DNR  Family Communication:  Level of care: Med-Surg Dispo:   The patient is from: home Anticipated d/c is to: to be determined Anticipated d/c date is: to be determined Patient currently is not medically ready to d/c due to: has not had a PT eval since 11/9   Subjective and Interval History:  Pt reported breathing improved.  Michela Pitcher he wanted to go home.    Pt noted to be confused.  According to record, pt hasn't had a PT session since 11/9.  PT ordered today.     Objective: Vitals:   01/21/21 1332 01/21/21 1415 01/21/21 1450 01/21/21 2043  BP: (!) 140/27 (!) 133/31 (!) 142/31 (!) 128/33  Pulse: 74  77 76  Resp: '14  16 19  ' Temp: 97.8 F (36.6 C)  98.1 F (36.7 C) 97.6 F (36.4 C)  TempSrc: Oral  Oral   SpO2: 95%  95% 96%  Weight:      Height:        Intake/Output Summary (Last 24 hours) at 01/22/2021 0225 Last data filed at 01/21/2021 1800 Gross per 24 hour  Intake --  Output 1550 ml  Net -1550 ml   Filed Weights   01/20/21 0419 01/20/21 1712 01/21/21 0343   Weight: 68 kg 68 kg 68.2 kg    Examination:   Constitutional: NAD, alert, oriented to person and place HEENT: conjunctivae and lids normal, EOMI CV: No cyanosis.   RESP: normal respiratory effort, on [place] Extremities: No effusions, edema in BLE SKIN: warm, dry   Data Reviewed: I have personally reviewed following labs and imaging studies  CBC: Recent Labs  Lab 01/15/21 0856 01/16/21 0849 01/17/21 0438 01/20/21 0234  WBC 13.5* 4.4 13.2* 11.8*  HGB 10.6* 10.3* 11.5* 11.2*  HCT 32.2* 32.6* 34.5* 32.8*  MCV 87.0 99.1 85.0 85.0  PLT 247 204 209 573*   Basic Metabolic Panel: Recent Labs  Lab 01/15/21 0856 01/16/21 0849 01/17/21 0438 01/18/21 0441 01/19/21 0421 01/20/21 0234  NA 136 137 136 135 133* 132*  K 4.1 4.3 3.8 4.3  4.3 4.7 4.6  CL 96* 104 98 96* 96* 95*  CO2 '22 27 26 24 22 26  ' GLUCOSE 131* 105* 107* 123* 115* 140*  BUN 72* 28* 59* 100* 117* 61*  CREATININE 6.26* 1.15 5.19* 7.05* 7.86* 4.78*  CALCIUM 7.8* 8.6* 7.8* 7.9* 7.8* 7.8*  MG 2.5* 2.1 2.2 2.4  --   --    GFR: Estimated Creatinine Clearance: 10.4 mL/min (A) (by C-G formula based on SCr of 4.78 mg/dL (H)). Liver Function Tests: No results for input(s): AST, ALT, ALKPHOS, BILITOT, PROT, ALBUMIN in the last 168 hours. No results for input(s): LIPASE, AMYLASE in the last 168 hours. No results for input(s): AMMONIA in the last 168 hours. Coagulation Profile: No results for input(s): INR, PROTIME in the last 168 hours. Cardiac Enzymes: No results for input(s): CKTOTAL, CKMB, CKMBINDEX, TROPONINI in the last 168 hours. BNP (last 3 results) No results for input(s): PROBNP in the last 8760 hours. HbA1C: No results for input(s): HGBA1C in the last 72 hours. CBG: Recent Labs  Lab 01/15/21 1958 01/17/21 0953 01/17/21 1242 01/17/21 1622 01/17/21 2049  GLUCAP 126* 104* 175* 201* 160*   Lipid Profile: No results for input(s): CHOL, HDL, LDLCALC, TRIG, CHOLHDL, LDLDIRECT in the last 72 hours. Thyroid  Function Tests: No results for input(s): TSH, T4TOTAL, FREET4, T3FREE, THYROIDAB in the last 72 hours. Anemia Panel: No results for input(s): VITAMINB12, FOLATE, FERRITIN, TIBC, IRON, RETICCTPCT in the last 72 hours. Sepsis Labs: No results for input(s): PROCALCITON, LATICACIDVEN in the last 168 hours.  No results found for this or any previous visit (from the past 240 hour(s)).    Radiology Studies: PERIPHERAL VASCULAR CATHETERIZATION  Result Date: 01/20/2021 See surgical note for result.    Scheduled Meds:  vitamin C  500 mg Oral Daily   aspirin EC  81 mg Oral Daily   Chlorhexidine Gluconate Cloth  6 each Topical Daily   Chlorhexidine Gluconate Cloth  6 each Topical Q0600   cholecalciferol  1,000 Units Oral Daily   feeding supplement (NEPRO CARB STEADY)  237 mL Oral TID BM   ferrous sulfate  325  mg Oral Q breakfast   heparin injection (subcutaneous)  5,000 Units Subcutaneous Q8H   ipratropium  2 puff Inhalation TID   levalbuterol  2 puff Inhalation TID   multivitamin  1 tablet Oral QHS   multivitamin with minerals  1 tablet Oral Daily   omega-3 acid ethyl esters  1 g Oral Daily   pantoprazole  40 mg Oral QHS   polyethylene glycol  17 g Oral BID   rosuvastatin  10 mg Oral QHS   zinc sulfate  220 mg Oral Daily   Continuous Infusions:  sodium chloride Stopped (01/18/21 0603)     LOS: 19 days     Enzo Bi, MD [facility] If 7PM-7AM, please contact night-coverage 01/22/2021, 2:25 AM

## 2021-01-22 DIAGNOSIS — J9601 Acute respiratory failure with hypoxia: Secondary | ICD-10-CM | POA: Diagnosis not present

## 2021-01-22 LAB — BASIC METABOLIC PANEL
Anion gap: 13 (ref 5–15)
BUN: 52 mg/dL — ABNORMAL HIGH (ref 8–23)
CO2: 24 mmol/L (ref 22–32)
Calcium: 8 mg/dL — ABNORMAL LOW (ref 8.9–10.3)
Chloride: 98 mmol/L (ref 98–111)
Creatinine, Ser: 4.25 mg/dL — ABNORMAL HIGH (ref 0.61–1.24)
GFR, Estimated: 13 mL/min — ABNORMAL LOW (ref >60)
Glucose, Bld: 105 mg/dL — ABNORMAL HIGH (ref 70–99)
Potassium: 4.3 mmol/L (ref 3.5–5.1)
Sodium: 135 mmol/L (ref 135–145)

## 2021-01-22 LAB — CBC
HCT: 35.6 % — ABNORMAL LOW (ref 39.0–52.0)
Hemoglobin: 11.7 g/dL — ABNORMAL LOW (ref 13.0–17.0)
MCH: 28.5 pg (ref 26.0–34.0)
MCHC: 32.9 g/dL (ref 30.0–36.0)
MCV: 86.8 fL (ref 80.0–100.0)
Platelets: 87 10*3/uL — ABNORMAL LOW (ref 150–400)
RBC: 4.1 MIL/uL — ABNORMAL LOW (ref 4.22–5.81)
RDW: 17.2 % — ABNORMAL HIGH (ref 11.5–15.5)
WBC: 12.6 10*3/uL — ABNORMAL HIGH (ref 4.0–10.5)
nRBC: 0 % (ref 0.0–0.2)

## 2021-01-22 LAB — MAGNESIUM: Magnesium: 1.9 mg/dL (ref 1.7–2.4)

## 2021-01-22 MED ORDER — DRONABINOL 2.5 MG PO CAPS
5.0000 mg | ORAL_CAPSULE | Freq: Two times a day (BID) | ORAL | Status: DC
  Administered 2021-01-22 – 2021-01-24 (×4): 5 mg via ORAL
  Filled 2021-01-22 (×4): qty 2

## 2021-01-22 NOTE — TOC Progression Note (Signed)
Transition of Care [facility]) - Progression Note    Patient Details  Name: Phillip Chavez MRN: [id number] Date of Birth: 07-31-1940  Transition of Care [facility]) CM/SW [place], RN Phone Number: 01/22/2021, 2:14 PM  Clinical Narrative: Spoke with Son, who is recommending that patient go to SNF in this area, no specific preference. Bed search started.      Expected Discharge Plan: Skilled Nursing Facility Barriers to Discharge: Waiting for outpatient dialysis  Expected Discharge Plan and Services Expected Discharge Plan: [place] Choice: [place] arrangements for the past 2 months: Single Family Home                 DME Arranged: [place] rolling DME Agency: [employer]       HH Arranged: PT, RN [place] Agency: [place] ([facility]) Date HH Agency Contacted: 01/07/21 Time [place]: 0909 Representative spoke with at [place]: [place] ([place]) Interventions    Readmission Risk Interventions No flowsheet data found.

## 2021-01-22 NOTE — NC FL2 (Signed)
[place] LEVEL OF CARE SCREENING TOOL     IDENTIFICATION  Patient Name: Phillip Chavez Birthdate: [birth year] Sex: male Admission Date (Current Location): 01/03/2021  [facility] and [place] Number:  Engineering geologist and Address:  [facility], [address], [place], [place] [address]      Provider Number: [id number]  Attending Physician Name and Address:  Enzo Bi, MD  Relative Name and Phone Number:  SEBASTIAN, LURZ (Spouse)   [phone]    Current Level of Care: Hospital Recommended Level of Care: [place] Prior Approval Number:    Date Approved/Denied:   PASRR Number: [id number]  Discharge Plan: SNF    Current Diagnoses: Patient Active Problem List   Diagnosis Date Noted   COVID-19 virus infection 01/14/2021   Aortic valve regurgitation    CAP (community acquired pneumonia) 01/03/2021   Sepsis ([place]) 01/03/2021   COPD (chronic obstructive pulmonary disease) ([place]) 01/03/2021   GERD (gastroesophageal reflux disease) 01/03/2021   HTN (hypertension)    HLD (hyperlipidemia)    Acute respiratory failure with hypoxia (HCC)    Elevated troponin    AKI (acute kidney injury) ([place])    Normocytic anemia     Orientation RESPIRATION BLADDER Height & Weight     Self  Normal External catheter Weight: 68.3 kg Height:  5\' 5"  (165.1 cm)  BEHAVIORAL SYMPTOMS/MOOD NEUROLOGICAL BOWEL NUTRITION STATUS      Continent Diet (Renal)  AMBULATORY STATUS COMMUNICATION OF NEEDS Skin   Limited Assist Verbally Normal                       Personal Care Assistance Level of Assistance  Bathing, Feeding, Dressing Bathing Assistance: Limited assistance Feeding assistance: Limited assistance Dressing Assistance: Limited assistance     Functional Limitations Info  Sight, Hearing, Speech Sight Info: Adequate Hearing Info: Adequate Speech Info: Adequate    SPECIAL CARE FACTORS FREQUENCY  PT (By licensed PT), OT (By  licensed OT)     PT Frequency: 5x week OT Frequency: 5x week            Contractures Contractures Info: Not present    Additional Factors Info  Code Status, Allergies Code Status Info: DNR Allergies Info: 31 allergies listed see documentation attached           Current Medications (01/22/2021):  This is the current hospital active medication list Current Facility-Administered Medications  Medication Dose Route Frequency Provider Last Rate Last Admin   0.9 %  sodium chloride infusion   Intravenous PRN Algernon Huxley, MD   Stopped at 01/18/21 0603   acetaminophen (TYLENOL) tablet 650 mg  650 mg Oral Q6H PRN Algernon Huxley, MD   650 mg at 01/18/21 0835   albuterol (PROVENTIL) (2.5 MG/3ML) 0.083% nebulizer solution 2.5 mg  2.5 mg Nebulization Q4H PRN Algernon Huxley, MD   2.5 mg at 01/06/21 0453   ascorbic acid (VITAMIN C) tablet 500 mg  500 mg Oral Daily Algernon Huxley, MD   500 mg at 01/19/21 1510   aspirin EC tablet 81 mg  81 mg Oral Daily Algernon Huxley, MD   81 mg at 01/19/21 1510   Chlorhexidine Gluconate Cloth 2 % PADS 6 each  6 each Topical Daily Algernon Huxley, MD   6 each at 01/20/21 1151   Chlorhexidine Gluconate Cloth 2 % PADS 6 each  6 each Topical Q0600 Algernon Huxley, MD   6 each at  01/22/21 5188   cholecalciferol (VITAMIN D3) tablet 1,000 Units  1,000 Units Oral Daily Algernon Huxley, MD   1,000 Units at 01/19/21 1510   dextromethorphan-guaiFENesin (MUCINEX DM) 30-600 MG per 12 hr tablet 1 tablet  1 tablet Oral BID PRN Algernon Huxley, MD   1 tablet at 01/17/21 1334   feeding supplement (NEPRO CARB STEADY) liquid 237 mL  237 mL Oral TID BM Algernon Huxley, MD 0 mL/hr at 01/18/21 2050 237 mL at 01/19/21 2036   ferrous sulfate tablet 325 mg  325 mg Oral Q breakfast Algernon Huxley, MD   325 mg at 01/19/21 1510   heparin injection 5,000 Units  5,000 Units Subcutaneous Q8H Algernon Huxley, MD   5,000 Units at 01/22/21 0600   hydrALAZINE (APRESOLINE) injection 5 mg  5 mg Intravenous Q2H PRN Algernon Huxley, MD       ipratropium (ATROVENT HFA) inhaler 2 puff  2 puff Inhalation TID Algernon Huxley, MD   2 puff at 01/22/21 1203   levalbuterol (XOPENEX HFA) inhaler 2 puff  2 puff Inhalation TID Algernon Huxley, MD   2 puff at 01/22/21 1203   LORazepam (ATIVAN) injection 0.5 mg  0.5 mg Intravenous Q4H PRN Algernon Huxley, MD   0.5 mg at 01/13/21 4166   Or   LORazepam (ATIVAN) tablet 0.5 mg  0.5 mg Oral Q4H PRN Algernon Huxley, MD   0.5 mg at 01/21/21 1645   multivitamin (RENA-VIT) tablet 1 tablet  1 tablet Oral QHS Algernon Huxley, MD   1 tablet at 01/21/21 2244   multivitamin with minerals tablet 1 tablet  1 tablet Oral Daily Algernon Huxley, MD   1 tablet at 01/19/21 1510   omega-3 acid ethyl esters (LOVAZA) capsule 1 g  1 g Oral Daily Algernon Huxley, MD   1 g at 01/19/21 1510   ondansetron (ZOFRAN) injection 4 mg  4 mg Intravenous Q8H PRN Algernon Huxley, MD   4 mg at 01/17/21 0802   ondansetron (ZOFRAN) injection 4 mg  4 mg Intravenous Q6H PRN Algernon Huxley, MD   4 mg at 01/22/21 1220   pantoprazole (PROTONIX) EC tablet 40 mg  40 mg Oral QHS Algernon Huxley, MD   40 mg at 01/21/21 2244   polyethylene glycol (MIRALAX / GLYCOLAX) packet 17 g  17 g Oral BID Algernon Huxley, MD   17 g at 01/18/21 2151   rosuvastatin (CRESTOR) tablet 10 mg  10 mg Oral QHS Algernon Huxley, MD   10 mg at 01/21/21 2244   zinc sulfate capsule 220 mg  220 mg Oral Daily Algernon Huxley, MD   220 mg at 01/19/21 1513     Discharge Medications: Please see discharge summary for a list of discharge medications.  Relevant Imaging Results:  Relevant Lab Results:   Additional Information ss# [id number]  Kerin Salen, RN

## 2021-01-22 NOTE — Progress Notes (Signed)
[facility]  PROGRESS NOTE   Subjective:   Patient resting quietly in his room.  Denies any acute complaints.  Reports that his appetite is quite poor No leg edema or shortness of breath  Objective:  Vital signs in last 24 hours:  Temp:  [97.6 F (36.4 C)-98.9 F (37.2 C)] 98.9 F (37.2 C) (11/24 0747) Pulse Rate:  [68-91] 91 (11/24 0747) Resp:  [14-19] 18 (11/24 0747) BP: (128-153)/(27-33) 153/29 (11/24 0747) SpO2:  [92 %-96 %] 92 % (11/24 0747) Weight:  [68.3 kg] 68.3 kg (11/24 0500)  Weight change: 0.3 kg Filed Weights   01/20/21 1712 01/21/21 0343 01/22/21 0500  Weight: 68 kg 68.2 kg 68.3 kg    Intake/Output: I/O last 3 completed shifts: In: -  Out: 1550 [Urine:550; Other:1000]   Intake/Output this shift:  No intake/output data recorded.  Physical Exam: General:  No acute distress, laying in bed  Head:  Normocephalic, atraumatic. Moist oral mucosal membranes  Lungs:   Clear to auscultation,     Heart:  Regular  rate and rhythm  Abdomen:   Soft, nontender  Extremities:  + peripheral edema.  Neurologic:  Awake and alert  Skin:  No lesions  Access: Rt IJ Permcath placed on 71/69/67    Basic Metabolic Panel: Recent Labs  Lab 01/16/21 0849 01/17/21 0438 01/18/21 0441 01/19/21 0421 01/20/21 0234 01/22/21 0332  NA 137 136 135 133* 132* 135  K 4.3 3.8 4.3  4.3 4.7 4.6 4.3  CL 104 98 96* 96* 95* 98  CO2 27 26 24 22 26 24   GLUCOSE 105* 107* 123* 115* 140* 105*  BUN 28* 59* 100* 117* 61* 52*  CREATININE 1.15 5.19* 7.05* 7.86* 4.78* 4.25*  CALCIUM 8.6* 7.8* 7.9* 7.8* 7.8* 8.0*  MG 2.1 2.2 2.4  --   --  1.9     CBC: Recent Labs  Lab 01/16/21 0849 01/17/21 0438 01/20/21 0234 01/22/21 0332  WBC 4.4 13.2* 11.8* 12.6*  HGB 10.3* 11.5* 11.2* 11.7*  HCT 32.6* 34.5* 32.8* 35.6*  MCV 99.1 85.0 85.0 86.8  PLT 204 209 149* 87*      Urinalysis: No results for input(s): COLORURINE, LABSPEC, PHURINE, GLUCOSEU, HGBUR, BILIRUBINUR, KETONESUR,  PROTEINUR, UROBILINOGEN, NITRITE, LEUKOCYTESUR in the last 72 hours.  Invalid input(s): APPERANCEUR     Imaging: PERIPHERAL VASCULAR CATHETERIZATION  Result Date: 01/20/2021 See surgical note for result.    Medications:    sodium chloride Stopped (01/18/21 0603)    vitamin C  500 mg Oral Daily   aspirin EC  81 mg Oral Daily   Chlorhexidine Gluconate Cloth  6 each Topical Daily   Chlorhexidine Gluconate Cloth  6 each Topical Q0600   cholecalciferol  1,000 Units Oral Daily   feeding supplement (NEPRO CARB STEADY)  237 mL Oral TID BM   ferrous sulfate  325 mg Oral Q breakfast   heparin injection (subcutaneous)  5,000 Units Subcutaneous Q8H   ipratropium  2 puff Inhalation TID   levalbuterol  2 puff Inhalation TID   multivitamin  1 tablet Oral QHS   multivitamin with minerals  1 tablet Oral Daily   omega-3 acid ethyl esters  1 g Oral Daily   pantoprazole  40 mg Oral QHS   polyethylene glycol  17 g Oral BID   rosuvastatin  10 mg Oral QHS   zinc sulfate  220 mg Oral Daily    Assessment/ Plan:     Principal Problem:   Acute respiratory failure with hypoxia (HCC)  Active Problems:   CAP (community acquired pneumonia)   Sepsis ([place])   COPD (chronic obstructive pulmonary disease) (HCC)   GERD (gastroesophageal reflux disease)   HTN (hypertension)   HLD (hyperlipidemia)   Elevated troponin   AKI (acute kidney injury) ([place])   Normocytic anemia   Aortic valve regurgitation   COVID-19 virus infection  [name] is a 80 y.o. white male with hypertension, coronary artery disease, congestive heart failure, COPD, GERD, hyperlipidemia, admitted to [facility] on CAP (community acquired pneumonia) [J18.9] AKI (acute kidney injury) ([place]) [N17.9] Community acquired pneumonia, unspecified laterality [J18.9] Congestive heart failure, unspecified HF chronicity, unspecified heart failure type ([place]) [I50.9]    #1: Acute kidney injury  on chronic kidney disease stage IIIB, baseline  1.64, GFR of 42 on admission.  Requiring hemodialysis. Resistant to furosemide.  -Next hemodialysis planned for Friday  - Dialysis coordinator has arranged outpatient treatment at [facility] with a start date of Monday, pending [facility] authorization.  - Will continue to monitor for renal recovery  #2. Anemia with chronic kidney disease: hemoglobin 11.7. No indication for ESA.   #3. Sepsis: E. Faecalis on blood cultures on 11/5.  Completed course of IV pip/tazo.   - Appreciate ID input.   #4. Acute respiratory failure with COVID-19 infection and pulmonary edema. Oxygen requirements have improved    LOS: [facility] 11/24/202211:45 AM

## 2021-01-22 NOTE — Evaluation (Signed)
Physical Therapy Evaluation Patient Details Name: Phillip Chavez MRN: [id number] DOB: 01-26-1941 Today's Date: 01/22/2021  History of Present Illness  Phillip Chavez is an 67yoM coming [facility] ED 01/03/21 found to have CAP (community acquired pneumonia). Pt PMH significant for hypertension, coronary artery disease, congestive heart failure, COPD, GERD, hyperlipidemia. Pt confused as present, poor  historian.  Clinical Impression  Pt admitted with the above diagnosis. Upon entry, pt in bed supine, awake and agreeable to PT, however pt is confused. Pt currently poor historian, and is oriented to person and place, but not time. Pt also initially reports he lives alone, however part way through eval pt's wife and son arrived. Pt then recognized his family, who confirmed pt cognition and functional mobility is different from his baseline. Per pt's spouse and son, pt was independent with ambulation and all ADLs prior to recent illness.  Pt currently on room air with SPO2 ranging from 89-95% throughout session and HR ranging from 101-113 bpm. Pt demo'd mod I bed mobility, but requires min a with STS and up to mod a once briefly standing at RW (pt was also limited due to nausea). Pt was unable to tolerate further intervention following standing. PT assisted pt back into bed, with head of bed elevated. Pt's family and nursing present at end of session. Pts current functional status is not appropriate for safely discharging home. PT recommends SNF at this time. Pt will benefit from skilled PT intervention to increase strength, independence and safety with mobility in preparation for discharge to the venue listed below.         Recommendations for follow up therapy are one component of a multi-disciplinary discharge planning process, led by the attending physician.  Recommendations may be updated based on patient status, additional functional criteria and insurance authorization.  Follow Up Recommendations Skilled  nursing-short term rehab (<3 hours/day)    Assistance Recommended at Discharge PRN  Functional Status Assessment Patient has had a recent decline in their functional status and demonstrates the ability to make significant improvements in function in a reasonable and predictable amount of time.  Equipment Recommendations  Rolling walker (2 wheels)    Recommendations for Other Services       Precautions / Restrictions Precautions Precautions: Fall Precaution Comments: Pt min a for STS, and mod a once standing Restrictions Weight Bearing Restrictions: No      Mobility  Bed Mobility Overal bed mobility: Modified Independent;Needs Assistance Bed Mobility: Supine to Sit;Sit to Supine     Supine to sit: Min guard Sit to supine: Min guard   General bed mobility comments: On room air. Pt able to scoot and turn in bed using UEs to assist. Min guard for sit<>supine Patient Response:  (confused)  Transfers Overall transfer level: Needs assistance Equipment used: Rolling walker (2 wheels) Transfers: Sit to/from Stand Sit to Stand: Mod assist;Min assist           General transfer comment: Requires min a to complete initial STS, however up to mod a to maintain standing balance, pt stating "I don't feel good."    Ambulation/Gait                  Stairs            Wheelchair Mobility    Modified Rankin (Stroke Patients Only)       Balance Overall balance assessment: Needs assistance Sitting-balance support: Bilateral upper extremity supported;Feet supported Sitting balance-Leahy Scale: Poor Sitting balance - Comments: Requires BUE  on RW to sit at EOB with PT providing superivision to min guard Postural control:  (currently requires heavy BUE support) Standing balance support: Bilateral upper extremity supported (with up to mod a from PT) Standing balance-Leahy Scale: Poor Standing balance comment: requires up to mod a from PT, pt states "I don't feel good"                              Pertinent Vitals/Pain Pain Assessment: No/denies pain Facial Expression: Relaxed, neutral Body Movements: Absence of movements Muscle Tension: Relaxed Compliance with ventilator (intubated pts.): N/A Vocalization (extubated pts.):  (pt confused, stating frequently "I don't know" when asked questions)    Home Living Family/patient expects to be discharged to:: Private residence Living Arrangements: Spouse/significant other (Pt's adult son lives next door) Available Help at Discharge: Family Type of Home: Mobile home Home Access: Stairs to enter Entrance Stairs-Rails: Psychiatric nurse of Steps: 5-6; pt son reports old stairs that are steep   Home Layout: One level Home Equipment: None Additional Comments: Pt confused and reports he is unsure of what home equipment he has. Pt's spouse and son present and report currently no equipment. Pt son does report he can install ramp.    Prior Function Prior Level of Function : Independent/Modified Independent             Mobility Comments: Per pt spouse, until recent illness pt was independent with ambulation and all ADLs ADLs Comments: independent     Hand Dominance        Extremity/Trunk Assessment                Communication   Communication: Other (comment) (pt confused, frequently states, "I don't know")  Cognition Arousal/Alertness: Lethargic (confused) Behavior During Therapy: Flat affect Overall Cognitive Status: Impaired/Different from baseline Area of Impairment: Orientation                 Orientation Level: Disoriented to;Time;Situation             General Comments: Pt oriented to person and place, pt with difficulty providing information. For example, pt reports he lives alone and has no family initially, but was able to recognize wife and son when they entered the room.        General Comments      Exercises Other Exercises Other  Exercises: bed mobility scooting, rolling to L side, side-lying to seated, seated at EOB x 8 min, STS to RW, brief standing at RW with mod a. Pt also able to to demo 2 reps of seated LAQ. Instructs pt and his family in pt performing ankle pumps while supine in bed.   Assessment/Plan    PT Assessment Patient needs continued PT services  PT Problem List Decreased activity tolerance;Decreased balance;Decreased mobility;Decreased strength;Decreased cognition       PT Treatment Interventions Balance training;Functional mobility training;Therapeutic activities;Therapeutic exercise;Patient/family education;Stair training;Gait training;DME instruction;Neuromuscular re-education    PT Goals (Current goals can be found in the Care Plan section)  Acute Rehab PT Goals Patient Stated Goal: pt currently confused. Pt family would like pt to improve strength and cognition prior to coming home PT Goal Formulation: With patient/family Time For Goal Achievement: 02/05/21 Potential to Achieve Goals: Good    Frequency Min 3X/week   Barriers to discharge Inaccessible home environment Pt with insufficient strength/mobility currently to enter his home (5 steps) and is below his PLOF.    Co-evaluation  AM-PAC PT "6 Clicks" Mobility  Outcome Measure Help needed turning from your back to your side while in a flat bed without using bedrails?: A Little Help needed moving from lying on your back to sitting on the side of a flat bed without using bedrails?: A Little Help needed moving to and from a bed to a chair (including a wheelchair)?: A Lot Help needed standing up from a chair using your arms (e.g., wheelchair or bedside chair)?: A Little Help needed to walk in hospital room?: A Lot Help needed climbing 3-5 steps with a railing? : A Lot 6 Click Score: 15    End of Session Equipment Utilized During Treatment: Gait belt Activity Tolerance: Patient limited by fatigue (pt feeling  nauseated.) Patient left: in bed;with call bell/phone within reach;with family/visitor present;with nursing/sitter in room Nurse Communication: Mobility status PT Visit Diagnosis: Difficulty in walking, not elsewhere classified (R26.2);Unsteadiness on feet (R26.81);Muscle weakness (generalized) (M62.81)    Time: 1610-9604 PT Time Calculation (min) (ACUTE ONLY): 38 min   Charges:   PT Evaluation $PT Eval Moderate Complexity: 1 Mod PT Treatments $Therapeutic Activity: 8-22 mins        Ricard Dillon PT, DPT   Zollie Pee 01/22/2021, 12:28 PM

## 2021-01-22 NOTE — Progress Notes (Signed)
PROGRESS NOTE    Phillip Chavez  WUJ:[id number] DOB: 1940/11/07 DOA: 01/03/2021 PCP: [facility] [place]  [phone]   Assessment & Plan:   Principal Problem:   Acute respiratory failure with hypoxia ([place]) Active Problems:   CAP (community acquired pneumonia)   Sepsis ([place])   COPD (chronic obstructive pulmonary disease) ([place])   GERD (gastroesophageal reflux disease)   HTN (hypertension)   HLD (hyperlipidemia)   Elevated troponin   AKI (acute kidney injury) ([place])   Normocytic anemia   Aortic valve regurgitation   COVID-19 virus infection   Phillip Chavez is a 80 year old male with past medical history of hypertension, CAD, COPD and hyperlipidemia admitted on 11/5 for shortness of breath that have been going on for over a week prior.  Patient found to have sepsis on admission secondary to pneumonia with positive blood cultures for Enterococcus.  Complicating matters were that patient contracted COVID wife during hospitalization and developed worsening respiratory failure on 11/11 requiring BiPAP and placement in stepdown unit.  Found to also have moderate to severe aortic regurg with pulmonary edema.  Acute kidney injury worsened with attempted diuresis and patient started on temporary dialysis.  Patient also has issues with nightly sundowning.   Patient continues to improve.  Oxygen weaned down to RA on 11/24.  Patient completed 10-day isolation period for COVID on 11/21.  vascular surgery placed PermCath.       Acute respiratory failure with hypoxemia ([place]):  Secondary to COVID infection, community-acquired pneumonia and volume overload from severe aortic regurgitation, and COPD exacerbation. Has completed IV antibiotics, Remdisivir.  Have been able to wean down from BiPAP to RA today 11/24.   Sepsis 2/2 Enterococcus bacteremia (HCC)   Met criteria for sepsis on admission given leukocytosis and heart rate greater than 90.  ID consulted.  Sepsis is since resolved.   Completed IV Zosyn course.  Enterococcus not felt to be associated with his pneumonia.        GERD (gastroesophageal reflux disease):  --cont PPI     HTN (hypertension):  --intermittent low BP --hold Cozaar and metop     HLD (hyperlipidemia):  Continue statin.     Elevated troponin 2/2 demand ischemia likely from sepsis/poor valvular function.     AKI (acute kidney injury) ([place]) in the setting of stage IIIb chronic kidney disease Oliguria   --started on dialysis.  permcath placed --Dialysis coordinator has arranged outpatient treatment at [facility]  --monitor for renal recovery     Normocytic anemia: Secondary to chronic renal disease Iron def --monitor Hgb --cont oral iron suppl     Aortic valve regurgitation     COVID-19 virus infection:  --tested pos after presentation.  Pt's wife was also positive.   --Completed IV Remdisivir.  Out of isolation.  Foley placed in stepdown 13 days ago --remove Foley  Poor oral intake --family would like to try appetite stimulant, and asked for dronabinol --start dronabinol 5 mg BID  Hospital delirium --Sundowning, intermittent confusion and agitation, noted since pt was in stepdown. --wife encouraged to visit and re-orient and reassure pt.    DVT prophylaxis: Heparin SQ Code Status: DNR  Family Communication: wife and son updated at bedside today.  Wife is the Media planner.  Level of care: Med-Surg Dispo:   The patient is from: home Anticipated d/c is to: SNF rehab Anticipated d/c date is: whenever bed available  Patient currently is medically ready to d/c.   Subjective and Interval History:  Family reported  pt having poor oral intake.  Pt also calling family asking to be taken home.  Noted to be intermittently confused.    PT eval today rec SNF rehab.     Objective: Vitals:   01/22/21 0500 01/22/21 0747 01/22/21 1203 01/22/21 1524  BP:  (!) 153/29  (!) 135/37  Pulse:  91  94  Resp:  18  16  Temp:  98.9 F  (37.2 C)  97.9 F (36.6 C)  TempSrc:  Oral  Oral  SpO2:  92% 93% 92%  Weight: 68.3 kg     Height:        Intake/Output Summary (Last 24 hours) at 01/22/2021 1611 Last data filed at 01/21/2021 1800 Gross per 24 hour  Intake --  Output 200 ml  Net -200 ml   Filed Weights   01/20/21 1712 01/21/21 0343 01/22/21 0500  Weight: 68 kg 68.2 kg 68.3 kg    Examination:   Constitutional: NAD, lethargic today CV: No cyanosis.   RESP: normal respiratory effort, on RA Extremities: No effusions, edema in BLE SKIN: warm, dry Foley present   Data Reviewed: I have personally reviewed following labs and imaging studies  CBC: Recent Labs  Lab 01/16/21 0849 01/17/21 0438 01/20/21 0234 01/22/21 0332  WBC 4.4 13.2* 11.8* 12.6*  HGB 10.3* 11.5* 11.2* 11.7*  HCT 32.6* 34.5* 32.8* 35.6*  MCV 99.1 85.0 85.0 86.8  PLT 204 209 149* 87*   Basic Metabolic Panel: Recent Labs  Lab 01/16/21 0849 01/17/21 0438 01/18/21 0441 01/19/21 0421 01/20/21 0234 01/22/21 0332  NA 137 136 135 133* 132* 135  K 4.3 3.8 4.3  4.3 4.7 4.6 4.3  CL 104 98 96* 96* 95* 98  CO2 _0 GLUCOSE 105* 107* 123* 115* 140* 105*  BUN 28* 59* 100* 117* 61* 52*  CREATININE 1.15 5.19* 7.05* 7.86* 4.78* 4.25*  CALCIUM 8.6* 7.8* 7.9* 7.8* 7.8* 8.0*  MG 2.1 2.2 2.4  --   --  1.9   GFR: Estimated Creatinine Clearance: 11.7 mL/min (A) (by C-G formula based on SCr of 4.25 mg/dL (H)). Liver Function Tests: No results for input(s): AST, ALT, ALKPHOS, BILITOT, PROT, ALBUMIN in the last 168 hours. No results for input(s): LIPASE, AMYLASE in the last 168 hours. No results for input(s): AMMONIA in the last 168 hours. Coagulation Profile: No results for input(s): INR, PROTIME in the last 168 hours. Cardiac Enzymes: No results for input(s): CKTOTAL, CKMB, CKMBINDEX, TROPONINI in the last 168 hours. BNP (last 3 results) No results for input(s): PROBNP in the last 8760 hours. HbA1C: No results for input(s):  HGBA1C in the last 72 hours. CBG: Recent Labs  Lab 01/15/21 1958 01/17/21 0953 01/17/21 1242 01/17/21 1622 01/17/21 2049  GLUCAP 126* 104* 175* 201* 160*   Lipid Profile: No results for input(s): CHOL, HDL, LDLCALC, TRIG, CHOLHDL, LDLDIRECT in the last 72 hours. Thyroid Function Tests: No results for input(s): TSH, T4TOTAL, FREET4, T3FREE, THYROIDAB in the last 72 hours. Anemia Panel: No results for input(s): VITAMINB12, FOLATE, FERRITIN, TIBC, IRON, RETICCTPCT in the last 72 hours. Sepsis Labs: No results for input(s): PROCALCITON, LATICACIDVEN in the last 168 hours.  No results found for this or any previous visit (from the past 240 hour(s)).    Radiology Studies: PERIPHERAL VASCULAR CATHETERIZATION  Result Date: 01/20/2021 See surgical note for result.    Scheduled Meds:  vitamin C  500 mg Oral Daily   aspirin EC  81 mg Oral Daily  Chlorhexidine Gluconate Cloth  6 each Topical Daily   Chlorhexidine Gluconate Cloth  6 each Topical Q0600   cholecalciferol  1,000 Units Oral Daily   dronabinol  5 mg Oral BID AC   feeding supplement (NEPRO CARB STEADY)  237 mL Oral TID BM   ferrous sulfate  325 mg Oral Q breakfast   heparin injection (subcutaneous)  5,000 Units Subcutaneous Q8H   ipratropium  2 puff Inhalation TID   levalbuterol  2 puff Inhalation TID   multivitamin  1 tablet Oral QHS   multivitamin with minerals  1 tablet Oral Daily   omega-3 acid ethyl esters  1 g Oral Daily   pantoprazole  40 mg Oral QHS   polyethylene glycol  17 g Oral BID   rosuvastatin  10 mg Oral QHS   zinc sulfate  220 mg Oral Daily   Continuous Infusions:  sodium chloride Stopped (01/18/21 0603)     LOS: 19 days     Enzo Bi, MD [facility] If 7PM-7AM, please contact night-coverage 01/22/2021, 4:11 PM

## 2021-01-22 NOTE — Progress Notes (Signed)
Foley removed.

## 2021-01-23 DIAGNOSIS — J9601 Acute respiratory failure with hypoxia: Secondary | ICD-10-CM | POA: Diagnosis not present

## 2021-01-23 LAB — CBC
HCT: 29.2 % — ABNORMAL LOW (ref 39.0–52.0)
Hemoglobin: 9.6 g/dL — ABNORMAL LOW (ref 13.0–17.0)
MCH: 28.6 pg (ref 26.0–34.0)
MCHC: 32.9 g/dL (ref 30.0–36.0)
MCV: 86.9 fL (ref 80.0–100.0)
Platelets: 66 10*3/uL — ABNORMAL LOW (ref 150–400)
RBC: 3.36 MIL/uL — ABNORMAL LOW (ref 4.22–5.81)
RDW: 17.5 % — ABNORMAL HIGH (ref 11.5–15.5)
WBC: 12.2 10*3/uL — ABNORMAL HIGH (ref 4.0–10.5)
nRBC: 0 % (ref 0.0–0.2)

## 2021-01-23 LAB — BASIC METABOLIC PANEL
Anion gap: 11 (ref 5–15)
BUN: 75 mg/dL — ABNORMAL HIGH (ref 8–23)
CO2: 25 mmol/L (ref 22–32)
Calcium: 7.7 mg/dL — ABNORMAL LOW (ref 8.9–10.3)
Chloride: 98 mmol/L (ref 98–111)
Creatinine, Ser: 5.86 mg/dL — ABNORMAL HIGH (ref 0.61–1.24)
GFR, Estimated: 9 mL/min — ABNORMAL LOW (ref >60)
Glucose, Bld: 107 mg/dL — ABNORMAL HIGH (ref 70–99)
Potassium: 4.3 mmol/L (ref 3.5–5.1)
Sodium: 134 mmol/L — ABNORMAL LOW (ref 135–145)

## 2021-01-23 LAB — MAGNESIUM: Magnesium: 2 mg/dL (ref 1.7–2.4)

## 2021-01-23 MED ORDER — HEPARIN SODIUM (PORCINE) 1000 UNIT/ML IJ SOLN
INTRAMUSCULAR | Status: AC
  Filled 2021-01-23: qty 10

## 2021-01-23 NOTE — Progress Notes (Signed)
Hemodialysis:  No fluid removal with 3hour dialysis treatment.Patient able to tolerate dialyzing in dialysis chair for 2nd time.Diastolic pressures dropped into 20s, uf goal was turned off and Shantelle,NP was notified. Patient with no complaints throughout treatment.CVC dressing remain clean throughout treatment.

## 2021-01-23 NOTE — Progress Notes (Signed)
[facility]  PROGRESS NOTE   Subjective:   Patient seen and evaluated during dialysis   HEMODIALYSIS FLOWSHEET:  Blood Flow Rate (mL/min): 300 mL/min Arterial Pressure (mmHg): -120 mmHg Venous Pressure (mmHg): 70 mmHg Transmembrane Pressure (mmHg): 60 mmHg Ultrafiltration Rate (mL/min): 170 mL/min Dialysate Flow Rate (mL/min): 500 ml/min Conductivity: Machine : 14 Conductivity: Machine : 14 Dialysis Fluid Bolus: Normal Saline Bolus Amount (mL): 250 mL  Seated in chair Tolerating treatment well No complaints at this time  Objective:  Vital signs in last 24 hours:  Temp:  [97.9 F (36.6 C)-99.4 F (37.4 C)] 99 F (37.2 C) (11/25 0840) Pulse Rate:  [79-94] 84 (11/25 0740) Resp:  [15-21] 16 (11/25 1045) BP: (100-135)/(28-37) 100/28 (11/25 1045) SpO2:  [90 %-93 %] 90 % (11/25 0740) Weight:  [67.6 kg] 67.6 kg (11/25 0840)  Weight change: -0.7 kg Filed Weights   01/22/21 0500 01/23/21 0400 01/23/21 0840  Weight: 68.3 kg 67.6 kg 67.6 kg    Intake/Output: I/O last 3 completed shifts: In: 360 [P.O.:240; NG/GT:120] Out: 0    Intake/Output this shift:  No intake/output data recorded.  Physical Exam: General:  No acute distress, seated in chair  Head:  Normocephalic, atraumatic. Moist oral mucosal membranes  Lungs:   Clear to auscultation,   normal effort, room air  Heart:  Regular  rate and rhythm  Abdomen:   Soft, nontender  Extremities:  + peripheral edema.  Neurologic:  Awake and alert  Skin:  No lesions  Access: Rt IJ Permcath placed on 79/02/40    Basic Metabolic Panel: Recent Labs  Lab 01/17/21 0438 01/18/21 0441 01/19/21 0421 01/20/21 0234 01/22/21 0332 01/23/21 0407  NA 136 135 133* 132* 135 134*  K 3.8 4.3  4.3 4.7 4.6 4.3 4.3  CL 98 96* 96* 95* 98 98  CO2 26 24 22 26 24 25   GLUCOSE 107* 123* 115* 140* 105* 107*  BUN 59* 100* 117* 61* 52* 75*  CREATININE 5.19* 7.05* 7.86* 4.78* 4.25* 5.86*  CALCIUM 7.8* 7.9* 7.8* 7.8* 8.0* 7.7*   MG 2.2 2.4  --   --  1.9 2.0     CBC: Recent Labs  Lab 01/17/21 0438 01/20/21 0234 01/22/21 0332 01/23/21 0407  WBC 13.2* 11.8* 12.6* 12.2*  HGB 11.5* 11.2* 11.7* 9.6*  HCT 34.5* 32.8* 35.6* 29.2*  MCV 85.0 85.0 86.8 86.9  PLT 209 149* 87* 66*      Urinalysis: No results for input(s): COLORURINE, LABSPEC, PHURINE, GLUCOSEU, HGBUR, BILIRUBINUR, KETONESUR, PROTEINUR, UROBILINOGEN, NITRITE, LEUKOCYTESUR in the last 72 hours.  Invalid input(s): APPERANCEUR     Imaging: No results found.   Medications:    sodium chloride Stopped (01/18/21 0603)    vitamin C  500 mg Oral Daily   aspirin EC  81 mg Oral Daily   Chlorhexidine Gluconate Cloth  6 each Topical Daily   Chlorhexidine Gluconate Cloth  6 each Topical Q0600   cholecalciferol  1,000 Units Oral Daily   dronabinol  5 mg Oral BID AC   feeding supplement (NEPRO CARB STEADY)  237 mL Oral TID BM   ferrous sulfate  325 mg Oral Q breakfast   heparin injection (subcutaneous)  5,000 Units Subcutaneous Q8H   ipratropium  2 puff Inhalation TID   levalbuterol  2 puff Inhalation TID   multivitamin  1 tablet Oral QHS   multivitamin with minerals  1 tablet Oral Daily   omega-3 acid ethyl esters  1 g Oral Daily   pantoprazole  40 mg Oral QHS   polyethylene glycol  17 g Oral BID   rosuvastatin  10 mg Oral QHS   zinc sulfate  220 mg Oral Daily    Assessment/ Plan:     Principal Problem:   Acute respiratory failure with hypoxia (HCC) Active Problems:   CAP (community acquired pneumonia)   Sepsis ([place])   COPD (chronic obstructive pulmonary disease) (HCC)   GERD (gastroesophageal reflux disease)   HTN (hypertension)   HLD (hyperlipidemia)   Elevated troponin   AKI (acute kidney injury) ([place])   Normocytic anemia   Aortic valve regurgitation   COVID-19 virus infection  [name] is a 80 y.o. 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  Requiring hemodialysis. Resistant to furosemide.  -Currently receiving dialysis, monitoring BP. No UF - Seated in chair  - Dialysis coordinator has arranged outpatient treatment at [facility] on MWF schedule with first treatment on Monday. Currently awaiting rehab placement.    #2. Anemia with chronic kidney disease: hemoglobin 9.6. At goal  #3. Sepsis: E. Faecalis on blood cultures on 11/5.  Completed course of IV pip/tazo.   - Appreciate ID input.   #4. Acute respiratory failure with COVID-19 infection and pulmonary edema. Weaned to room air    LOS: [facility] 11/25/202210:56 AM

## 2021-01-23 NOTE — Progress Notes (Signed)
Moderate bloody drainage from from HD cath to left chest wall. No hemorrhage noted. Stringy wet clots noted to HD cath insertion site. Central line dressing changed x2 this shift in accordant with CLABSI protocol. Spoke to Dr. Billie Ruddy and she is aware and stated she will have vascular look at  it in the morning. No urine output since foley removal. Bladder scanned x2 with less than 90cc each time. However, Patient is on HD. Patient denied pain or discomfort. Poor appetite. Baseline orientation. Slept through the night. Call bell kept within reach. Will cont to monitor and endorse.

## 2021-01-23 NOTE — TOC Progression Note (Signed)
Transition of Care [facility]) - Progression Note    Patient Details  Name: Phillip Chavez MRN: [id number] Date of Birth: 05-22-1940  Transition of Care [facility]) CM/SW [place], RN Phone Number: 01/23/2021, 4:36 PM  Clinical Narrative:  Patient can discharge to [facility] Sat. 01/24/21. Spoke with Otila Kluver at [place], Norva Pavlov [phone] and wife. Otila Kluver checked and says patient has Medicare A. No authorization is required.     Expected Discharge Plan: Skilled Nursing Facility Barriers to Discharge: Waiting for outpatient dialysis  Expected Discharge Plan and Services Expected Discharge Plan: [place] Choice: [place] arrangements for the past 2 months: Single Family Home                 DME Arranged: [place] rolling DME Agency: [employer]       HH Arranged: PT, RN [place] Agency: [place] ([facility]) Date HH Agency Contacted: 01/07/21 Time [place]: 0909 Representative spoke with at [place]: [place] ([place]) Interventions    Readmission Risk Interventions No flowsheet data found.

## 2021-01-23 NOTE — Progress Notes (Signed)
UF goal with dialysis turned off due to blood pressure trending low,[name],NP  notified. Patient with no complaints.

## 2021-01-23 NOTE — Progress Notes (Signed)
PROGRESS NOTE    Phillip Chavez  WNU:[id number] DOB: 06-10-1940 DOA: 01/03/2021 PCP: [facility] [place]  [phone]   Assessment & Plan:   Principal Problem:   Acute respiratory failure with hypoxia ([place]) Active Problems:   CAP (community acquired pneumonia)   Sepsis ([place])   COPD (chronic obstructive pulmonary disease) ([place])   GERD (gastroesophageal reflux disease)   HTN (hypertension)   HLD (hyperlipidemia)   Elevated troponin   AKI (acute kidney injury) ([place])   Normocytic anemia   Aortic valve regurgitation   COVID-19 virus infection   Phillip Chavez is a 80 year old male with past medical history of hypertension, CAD, COPD and hyperlipidemia admitted on 11/5 for shortness of breath that have been going on for over a week prior.  Patient found to have sepsis on admission secondary to pneumonia with positive blood cultures for Enterococcus.  Complicating matters were that patient contracted COVID wife during hospitalization and developed worsening respiratory failure on 11/11 requiring BiPAP and placement in stepdown unit.  Found to also have moderate to severe aortic regurg with pulmonary edema.  Acute kidney injury worsened with attempted diuresis and patient started on temporary dialysis.  Patient also has issues with nightly sundowning.   Patient continues to improve.  Oxygen weaned down to RA on 11/24.  Patient completed 10-day isolation period for COVID on 11/21.  vascular surgery placed PermCath.     Acute respiratory failure with hypoxemia ([place]):  Secondary to COVID infection, community-acquired pneumonia and volume overload from severe aortic regurgitation, and COPD exacerbation. Has completed IV antibiotics, Remdisivir.  Volume removed with dialysis. Have been weaned down from BiPAP to RA on 11/24.   Sepsis 2/2 Enterococcus bacteremia (HCC)   Met criteria for sepsis on admission given leukocytosis and heart rate greater than 90.  ID consulted.  Sepsis since  resolved.  Completed IV Zosyn course.  Enterococcus not felt to be associated with his pneumonia.        GERD (gastroesophageal reflux disease):  --cont PPI     HTN (hypertension):  --intermittent low BP --hold home losartan     HLD (hyperlipidemia):  Continue statin.     Elevated troponin 2/2 demand ischemia likely from sepsis/poor valvular function.     AKI (acute kidney injury) ([place]) in the setting of stage IIIb chronic kidney disease Oliguria   HD on MWF --started on dialysis.  permcath placed --Dialysis coordinator has arranged outpatient treatment at [facility]  --Foley removed today. --monitor for renal recovery --HD per nephrology while inpatient     Normocytic anemia: Secondary to chronic renal disease Iron def --monitor Hgb --cont oral iron suppl     Aortic valve regurgitation     COVID-19 virus infection:  --tested pos after presentation.  Pt's wife was also positive.   --Completed IV Remdisivir.  Out of isolation.  Poor oral intake --family would like to try appetite stimulant, and asked for dronabinol, started on 11/24 --cont dronabinol 5 mg BID  Hospital delirium --Sundowning, intermittent confusion and agitation, noted since pt was in stepdown. --wife encouraged to visit and re-orient and reassure pt.    DVT prophylaxis: Heparin SQ Code Status: DNR  Family Communication:  Level of care: Med-Surg Dispo:   The patient is from: home Anticipated d/c is to: SNF rehab Anticipated d/c date is: whenever bed available, likely tomorrow  Patient currently is medically ready to d/c.   Subjective and Interval History:  Pt seen after dialysis today.  Pt was more calm and  said he would be willing to go to rehab.  Reported breathing better now.    Foley removed.  Bladder scan showed minimum amount of urine.     Objective: Vitals:   01/23/21 1145 01/23/21 1200 01/23/21 1208 01/23/21 1517  BP: (!) 144/37 (!) 139/33 (!) 144/34 (!) 127/34  Pulse:   83  88  Resp: '19 19  18  ' Temp:   98.6 F (37 C) 97.9 F (36.6 C)  TempSrc:   Oral Oral  SpO2:   92% (!) 87%  Weight:      Height:        Intake/Output Summary (Last 24 hours) at 01/23/2021 1620 Last data filed at 01/23/2021 1206 Gross per 24 hour  Intake 360 ml  Output -261 ml  Net 621 ml   Filed Weights   01/22/21 0500 01/23/21 0400 01/23/21 0840  Weight: 68.3 kg 67.6 kg 67.6 kg    Examination:   Constitutional: NAD, AAOx3, frail   HEENT: conjunctivae and lids normal, EOMI CV: No cyanosis.   RESP: normal respiratory effort, on RA Extremities: No effusions, edema in BLE Neuro: II - XII grossly intact.     Data Reviewed: I have personally reviewed following labs and imaging studies  CBC: Recent Labs  Lab 01/17/21 0438 01/20/21 0234 01/22/21 0332 01/23/21 0407  WBC 13.2* 11.8* 12.6* 12.2*  HGB 11.5* 11.2* 11.7* 9.6*  HCT 34.5* 32.8* 35.6* 29.2*  MCV 85.0 85.0 86.8 86.9  PLT 209 149* 87* 66*   Basic Metabolic Panel: Recent Labs  Lab 01/17/21 0438 01/18/21 0441 01/19/21 0421 01/20/21 0234 01/22/21 0332 01/23/21 0407  NA 136 135 133* 132* 135 134*  K 3.8 4.3  4.3 4.7 4.6 4.3 4.3  CL 98 96* 96* 95* 98 98  CO2 '26 24 22 26 24 25  ' GLUCOSE 107* 123* 115* 140* 105* 107*  BUN 59* 100* 117* 61* 52* 75*  CREATININE 5.19* 7.05* 7.86* 4.78* 4.25* 5.86*  CALCIUM 7.8* 7.9* 7.8* 7.8* 8.0* 7.7*  MG 2.2 2.4  --   --  1.9 2.0   GFR: Estimated Creatinine Clearance: 8.5 mL/min (A) (by C-G formula based on SCr of 5.86 mg/dL (H)). Liver Function Tests: No results for input(s): AST, ALT, ALKPHOS, BILITOT, PROT, ALBUMIN in the last 168 hours. No results for input(s): LIPASE, AMYLASE in the last 168 hours. No results for input(s): AMMONIA in the last 168 hours. Coagulation Profile: No results for input(s): INR, PROTIME in the last 168 hours. Cardiac Enzymes: No results for input(s): CKTOTAL, CKMB, CKMBINDEX, TROPONINI in the last 168 hours. BNP (last 3 results) No  results for input(s): PROBNP in the last 8760 hours. HbA1C: No results for input(s): HGBA1C in the last 72 hours. CBG: Recent Labs  Lab 01/17/21 0953 01/17/21 1242 01/17/21 1622 01/17/21 2049  GLUCAP 104* 175* 201* 160*   Lipid Profile: No results for input(s): CHOL, HDL, LDLCALC, TRIG, CHOLHDL, LDLDIRECT in the last 72 hours. Thyroid Function Tests: No results for input(s): TSH, T4TOTAL, FREET4, T3FREE, THYROIDAB in the last 72 hours. Anemia Panel: No results for input(s): VITAMINB12, FOLATE, FERRITIN, TIBC, IRON, RETICCTPCT in the last 72 hours. Sepsis Labs: No results for input(s): PROCALCITON, LATICACIDVEN in the last 168 hours.  No results found for this or any previous visit (from the past 240 hour(s)).    Radiology Studies: No results found.   Scheduled Meds:  vitamin C  500 mg Oral Daily   aspirin EC  81 mg Oral Daily   Chlorhexidine Gluconate  Cloth  6 each Topical Daily   Chlorhexidine Gluconate Cloth  6 each Topical Q0600   cholecalciferol  1,000 Units Oral Daily   dronabinol  5 mg Oral BID AC   feeding supplement (NEPRO CARB STEADY)  237 mL Oral TID BM   ferrous sulfate  325 mg Oral Q breakfast   heparin injection (subcutaneous)  5,000 Units Subcutaneous Q8H   heparin sodium (porcine)       ipratropium  2 puff Inhalation TID   levalbuterol  2 puff Inhalation TID   multivitamin  1 tablet Oral QHS   multivitamin with minerals  1 tablet Oral Daily   omega-3 acid ethyl esters  1 g Oral Daily   pantoprazole  40 mg Oral QHS   polyethylene glycol  17 g Oral BID   rosuvastatin  10 mg Oral QHS   zinc sulfate  220 mg Oral Daily   Continuous Infusions:  sodium chloride Stopped (01/18/21 0603)     LOS: 20 days     Enzo Bi, MD [facility] If 7PM-7AM, please contact night-coverage 01/23/2021, 4:20 PM

## 2021-01-23 NOTE — Progress Notes (Addendum)
PT Cancellation Note  Patient Details Name: Phillip Chavez MRN: [id number] DOB: 1940-11-13   Cancelled Treatment:    Reason Eval/Treat Not Completed: Patient at procedure or test/unavailable. Chart reviewed. Pt off unit for dialysis. Will re-attempt as medically appropriate.  Per RN via Epic secure chat, pt back from HD but is having low BP readings. RN requesting to put PT on hold for today.   [place] Caster. Fairly IV, PT, DPT Physical Therapist- [facility]  01/23/2021, 12:20 PM

## 2021-01-24 DIAGNOSIS — J9601 Acute respiratory failure with hypoxia: Secondary | ICD-10-CM | POA: Diagnosis not present

## 2021-01-24 LAB — CBC
HCT: 31.1 % — ABNORMAL LOW (ref 39.0–52.0)
Hemoglobin: 10.1 g/dL — ABNORMAL LOW (ref 13.0–17.0)
MCH: 28.9 pg (ref 26.0–34.0)
MCHC: 32.5 g/dL (ref 30.0–36.0)
MCV: 88.9 fL (ref 80.0–100.0)
Platelets: 65 10*3/uL — ABNORMAL LOW (ref 150–400)
RBC: 3.5 MIL/uL — ABNORMAL LOW (ref 4.22–5.81)
RDW: 17.7 % — ABNORMAL HIGH (ref 11.5–15.5)
WBC: 15 10*3/uL — ABNORMAL HIGH (ref 4.0–10.5)
nRBC: 0 % (ref 0.0–0.2)

## 2021-01-24 LAB — BASIC METABOLIC PANEL
Anion gap: 12 (ref 5–15)
BUN: 47 mg/dL — ABNORMAL HIGH (ref 8–23)
CO2: 25 mmol/L (ref 22–32)
Calcium: 8.1 mg/dL — ABNORMAL LOW (ref 8.9–10.3)
Chloride: 99 mmol/L (ref 98–111)
Creatinine, Ser: 4.54 mg/dL — ABNORMAL HIGH (ref 0.61–1.24)
GFR, Estimated: 12 mL/min — ABNORMAL LOW (ref >60)
Glucose, Bld: 85 mg/dL (ref 70–99)
Potassium: 4.2 mmol/L (ref 3.5–5.1)
Sodium: 136 mmol/L (ref 135–145)

## 2021-01-24 LAB — MAGNESIUM: Magnesium: 1.9 mg/dL (ref 1.7–2.4)

## 2021-01-24 MED ORDER — RENA-VITE PO TABS: 1.0000 | ORAL_TABLET | Freq: Every day | ORAL | 0 refills | Status: DC

## 2021-01-24 MED ORDER — POLYETHYLENE GLYCOL 3350 17 G PO PACK: 17.0000 g | PACK | Freq: Two times a day (BID) | ORAL | 0 refills | Status: AC

## 2021-01-24 MED ORDER — DRONABINOL 5 MG PO CAPS: 5.0000 mg | ORAL_CAPSULE | Freq: Two times a day (BID) | ORAL | 0 refills | Status: AC

## 2021-01-24 MED ORDER — NEPRO/CARBSTEADY PO LIQD: 237.0000 mL | Freq: Three times a day (TID) | ORAL | 0 refills | Status: AC

## 2021-01-24 MED ORDER — FERROUS SULFATE 325 (65 FE) MG PO TABS: 325.0000 mg | ORAL_TABLET | Freq: Every day | ORAL | 3 refills | Status: AC

## 2021-01-24 NOTE — Progress Notes (Signed)
[facility]  PROGRESS NOTE   Subjective:   Patient seen resting in bed quietly Per nursing note, patient experienced confusion overnight with witnessed fall.  No injuries reported, patient placed in a room closer to the nurses station.  Dialysis received yesterday with patient seated in chair, tolerated fair  Objective:  Vital signs in last 24 hours:  Temp:  [97.6 F (36.4 C)-98.7 F (37.1 C)] 97.6 F (36.4 C) (11/26 0842) Pulse Rate:  [77-94] 81 (11/26 0842) Resp:  [16-20] 16 (11/26 0842) BP: (127-144)/(25-37) 144/31 (11/26 0842) SpO2:  [87 %-98 %] 98 % (11/26 0842)  Weight change: 0 kg Filed Weights   01/22/21 0500 01/23/21 0400 01/23/21 0840  Weight: 68.3 kg 67.6 kg 67.6 kg    Intake/Output: I/O last 3 completed shifts: In: 360 [P.O.:240; NG/GT:120] Out: -261    Intake/Output this shift:  No intake/output data recorded.  Physical Exam: General:  No acute distress, laying in bed  Head:  Normocephalic, atraumatic. Moist oral mucosal membranes  Lungs:   Clear to auscultation,   normal effort, room air  Heart:  Regular  rate and rhythm  Abdomen:   Soft, nontender  Extremities: Trace peripheral edema.  Neurologic:  Awake and alert  Skin:  No lesions  Access: Rt IJ Permcath placed on 70/01/74    Basic Metabolic Panel: Recent Labs  Lab 01/18/21 0441 01/19/21 0421 01/20/21 0234 01/22/21 0332 01/23/21 0407 01/24/21 0452  NA 135 133* 132* 135 134* 136  K 4.3  4.3 4.7 4.6 4.3 4.3 4.2  CL 96* 96* 95* 98 98 99  CO2 24 22 26 24 25 25   GLUCOSE 123* 115* 140* 105* 107* 85  BUN 100* 117* 61* 52* 75* 47*  CREATININE 7.05* 7.86* 4.78* 4.25* 5.86* 4.54*  CALCIUM 7.9* 7.8* 7.8* 8.0* 7.7* 8.1*  MG 2.4  --   --  1.9 2.0 1.9     CBC: Recent Labs  Lab 01/20/21 0234 01/22/21 0332 01/23/21 0407 01/24/21 0452  WBC 11.8* 12.6* 12.2* 15.0*  HGB 11.2* 11.7* 9.6* 10.1*  HCT 32.8* 35.6* 29.2* 31.1*  MCV 85.0 86.8 86.9 88.9  PLT 149* 87* 66* 65*       Urinalysis: No results for input(s): COLORURINE, LABSPEC, PHURINE, GLUCOSEU, HGBUR, BILIRUBINUR, KETONESUR, PROTEINUR, UROBILINOGEN, NITRITE, LEUKOCYTESUR in the last 72 hours.  Invalid input(s): APPERANCEUR     Imaging: No results found.   Medications:    sodium chloride Stopped (01/18/21 0603)    vitamin C  500 mg Oral Daily   aspirin EC  81 mg Oral Daily   Chlorhexidine Gluconate Cloth  6 each Topical Daily   Chlorhexidine Gluconate Cloth  6 each Topical Q0600   cholecalciferol  1,000 Units Oral Daily   dronabinol  5 mg Oral BID AC   feeding supplement (NEPRO CARB STEADY)  237 mL Oral TID BM   ferrous sulfate  325 mg Oral Q breakfast   heparin injection (subcutaneous)  5,000 Units Subcutaneous Q8H   ipratropium  2 puff Inhalation TID   levalbuterol  2 puff Inhalation TID   multivitamin  1 tablet Oral QHS   multivitamin with minerals  1 tablet Oral Daily   omega-3 acid ethyl esters  1 g Oral Daily   pantoprazole  40 mg Oral QHS   polyethylene glycol  17 g Oral BID   rosuvastatin  10 mg Oral QHS   zinc sulfate  220 mg Oral Daily    Assessment/ Plan:     Principal Problem:  Acute respiratory failure with hypoxia (HCC) Active Problems:   CAP (community acquired pneumonia)   Sepsis ([place])   COPD (chronic obstructive pulmonary disease) (HCC)   GERD (gastroesophageal reflux disease)   HTN (hypertension)   HLD (hyperlipidemia)   Elevated troponin   AKI (acute kidney injury) ([place])   Normocytic anemia   Aortic valve regurgitation   COVID-19 virus infection  Phillip Chavez is a 80 y.o. 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  Requiring hemodialysis. Resistant to furosemide.  -Received dialysis yesterday, tolerated fair.  UF turned off due to low diastolic and MAP readings.  PermCath dressing remain clean dry and intact during treatment.  Next treatment scheduled for Monday if remains inpatient.  - Dialysis coordinator has arranged outpatient treatment at [facility] on MWF schedule with first treatment on Monday.    #2. Anemia with chronic kidney disease: Hemoglobin remains at goal, no need for ESA.  #3. Sepsis: E. Faecalis on blood cultures on 11/5.  Completed course of IV pip/tazo.   - Appreciate ID input.   #4. Acute respiratory failure with COVID-19 infection and pulmonary edema.  Stable on room air    LOS: [facility] 11/26/202211:13 AM

## 2021-01-24 NOTE — Discharge Summary (Signed)
Physician Discharge Summary   Phillip Chavez  male DOB: 10-27-1940  VZC:[id number]  PCP: [facility]  Admit date: 01/03/2021 Discharge date: 01/24/2021  Admitted From: home Disposition:  SNF CODE STATUS: DNR   Hospital Course:  For full details, please see H&P, progress notes, consult notes and ancillary notes.  Briefly,  Phillip Chavez is a 80 year old male with past medical history of hypertension, CAD, COPD who was admitted on 11/5 for shortness of breath that have been going on for over a week prior.    Patient found to have sepsis on admission secondary to pneumonia with positive blood cultures for Enterococcus.  Complicating matters were that patient contracted COVID from wife during hospitalization and developed worsening respiratory failure on 11/11 requiring BiPAP and transfer to stepdown unit.  Found to also have moderate to severe aortic regurg with pulmonary edema.  Acute kidney injury worsened with failed diuresis and patient started on dialysis.  Patient also has issues with nightly sundowning.   Patient continues to improve.  Oxygen weaned down to RA on 11/24.  Patient completed 10-day isolation period for COVID on 11/21.  vascular surgery placed PermCath.     Acute respiratory failure with hypoxemia ([place]):  Secondary to COVID infection, community-acquired pneumonia and volume overload from severe aortic regurgitation, and COPD exacerbation. Has completed IV antibiotics, Remdisivir, and steroid course.  Volume removed with dialysis. Had been weaned down to RA on 11/24.    Sepsis 2/2 Enterococcus bacteremia (HCC)   CAP Met criteria for sepsis on admission given leukocytosis and heart rate greater than 90.  ID consulted.  Sepsis since resolved.  Completed IV Zosyn course.  Enterococcus not felt to be associated with his pneumonia.        GERD (gastroesophageal reflux disease):  --cont PPI     HTN (hypertension):  --intermittent low BP --hold home  losartan     HLD (hyperlipidemia):  Continue statin.     Elevated troponin 2/2 demand ischemia likely from sepsis/poor valvular function.     AKI (acute kidney injury) ([place]) in the setting of stage IIIb chronic kidney disease Oliguria   HD on MWF --started on dialysis.  permcath placed.  There was bleeding around the PermCath site on 11/24 after pt reportedly "fell on it" while in bed, per family.  Bleeding has since stopped after several dressing changes.  Next dressing change to be done at next outpatient dialysis session, per nephro. --Dialysis coordinator has arranged outpatient treatment at [facility], to start on Monday 01/26/21.    Normocytic anemia: Secondary to chronic renal disease Iron def --Hgb table around 10 --cont oral iron suppl     Aortic valve regurgitation     COVID-19 virus infection:  --tested pos after presentation.  caught from pt's wife. --Completed IV Remdisivir.  Out of isolation.   Poor oral intake --family would like to try appetite stimulant, and asked for dronabinol, started on 11/24 --cont dronabinol 5 mg BID for 10 more days after discharge.   Hospital delirium --Sundowning, intermittent confusion and agitation, noted since pt was in stepdown.  Worse when wife is not around. --wife encouraged to visit and re-orient and reassure pt.   Discharge Diagnoses:  Principal Problem:   Acute respiratory failure with hypoxia ([place]) Active Problems:   CAP (community acquired pneumonia)   Sepsis ([place])   COPD (chronic obstructive pulmonary disease) (HCC)   GERD (gastroesophageal reflux disease)   HTN (hypertension)   HLD (hyperlipidemia)   Elevated troponin  AKI (acute kidney injury) ([place])   Normocytic anemia   Aortic valve regurgitation   COVID-19 virus infection   30 Day Unplanned Readmission Risk Score    Flowsheet Row ED to Hosp-Admission (Current) from 01/03/2021 in [place]  30 Day Unplanned  Readmission Risk Score (%) 19.12 Filed at 01/24/2021 0800       This score is the patient's risk of an unplanned readmission within 30 days of being discharged (0 -100%). The score is based on dignosis, age, lab data, medications, orders, and past utilization.   Low:  0-14.9   Medium: 15-21.9   High: 22-29.9   Extreme: 30 and above         Discharge Instructions:  Allergies as of 01/24/2021       Reactions   Aggrenox [aspirin-dipyridamole Er] Nausea And Vomiting   Atorvastatin Other (See Comments)   Muscle pain   Azithromycin    Cefpodoxime Other (See Comments)   headache   Ciprofloxacin    Claritin [loratadine] Other (See Comments)   Urinary retention   Contrast Media [iodinated Diagnostic Agents] Hives   Doxazosin Other (See Comments)   Elevated blood pressure   Doxycycline    Equal [aspartame] Diarrhea   Fenofibrate Other (See Comments)   Muscle pain   Flomax [tamsulosin] Other (See Comments)   Elevated blood pressure   Flunisolide Other (See Comments)   Burning sensation   Fluticasone-salmeterol    Throat irriation   Fluvastatin Other (See Comments)   Muscle pain   Gemfibrozil Other (See Comments)   Muscle pain   Haemophilus Influenzae Vaccines Other (See Comments)   Chest pain   Hydrochlorothiazide Other (See Comments)   weakness   Levofloxacin Other (See Comments)   Nervousness, aggitation   Lisinopril Other (See Comments)   Chest pain   Moxifloxacin Other (See Comments)   Altered behavior   Niacin And Related Other (See Comments)   flushing   Oxycodone    Plavix [clopidogrel] Other (See Comments)   Constriction in throat   Pravastatin Other (See Comments)   Elevated blood pressure   Simvastatin Other (See Comments)   Muscle pain   Terazosin Other (See Comments)   Fatigue   Zetia [ezetimibe] Other (See Comments)   Edema   Amoxicillin Palpitations   Chest pain   Mometasone Palpitations   Elevated blood pressure   Sulfa Antibiotics Rash,  Hypertension        Medication List     STOP taking these medications    furosemide 20 MG tablet Commonly known as: LASIX   losartan 50 MG tablet Commonly known as: COZAAR   meloxicam 15 MG tablet Commonly known as: MOBIC   MULTIVITAMIN ADULTS PO   omeprazole 20 MG capsule Commonly known as: PRILOSEC       TAKE these medications    albuterol 108 (90 Base) MCG/ACT inhaler Commonly known as: VENTOLIN HFA Inhale 2 puffs into the lungs every 6 (six) hours as needed for wheezing or shortness of breath.   cholecalciferol 25 MCG (1000 UNIT) tablet Commonly known as: VITAMIN D3 Take 1,000 Units by mouth daily.   dronabinol 5 MG capsule Commonly known as: MARINOL Take 1 capsule (5 mg total) by mouth 2 (two) times daily before lunch and supper for 10 days.   feeding supplement (NEPRO CARB STEADY) Liqd Take 237 mLs by mouth 3 (three) times daily between meals.   ferrous sulfate 325 (65 FE) MG tablet Take 1 tablet (325 mg total)  by mouth daily with breakfast.   Fish Oil 1000 MG Caps TAKE 3 CAPSULES (SEA-OMEGA 30 FISH OIL) BY MOUTH TWO TIMES A DAY FOR CHOLESTEROL   ketotifen 0.025 % ophthalmic solution Commonly known as: ZADITOR 1 drop 2 (two) times daily.   multivitamin Tabs tablet Take 1 tablet by mouth at bedtime.   pantoprazole 40 MG tablet Commonly known as: PROTONIX 1 tablet daily.   polyethylene glycol 17 g packet Commonly known as: MIRALAX / GLYCOLAX Take 17 g by mouth 2 (two) times daily.   pseudoephedrine-acetaminophen 30-500 MG Tabs tablet Commonly known as: TYLENOL SINUS Take 1 tablet by mouth every 4 (four) hours as needed.   rosuvastatin 40 MG tablet Commonly known as: CRESTOR Take 40 mg by mouth daily.         Contact information for follow-up providers     [facility] [place] [facility].   Specialty: General Practice Contact information: [place] [address] [phone]              Contact information for  after-discharge care     Destination     [facility] Preferred SNF .   Service: Skilled Nursing Contact information: [place] [id number] [phone]                     Allergies  Allergen Reactions   Aggrenox [Aspirin-Dipyridamole Er] Nausea And Vomiting   Atorvastatin Other (See Comments)    Muscle pain   Azithromycin    Cefpodoxime Other (See Comments)    headache   Ciprofloxacin    Claritin [Loratadine] Other (See Comments)    Urinary retention   Contrast Media [Iodinated Diagnostic Agents] Hives   Doxazosin Other (See Comments)    Elevated blood pressure   Doxycycline    Equal [Aspartame] Diarrhea   Fenofibrate Other (See Comments)    Muscle pain   Flomax [Tamsulosin] Other (See Comments)    Elevated blood pressure   Flunisolide Other (See Comments)    Burning sensation   Fluticasone-Salmeterol     Throat irriation   Fluvastatin Other (See Comments)    Muscle pain   Gemfibrozil Other (See Comments)    Muscle pain   Haemophilus Influenzae Vaccines Other (See Comments)    Chest pain   Hydrochlorothiazide Other (See Comments)    weakness   Levofloxacin Other (See Comments)    Nervousness, aggitation   Lisinopril Other (See Comments)    Chest pain   Moxifloxacin Other (See Comments)    Altered behavior   Niacin And Related Other (See Comments)    flushing   Oxycodone    Plavix [Clopidogrel] Other (See Comments)    Constriction in throat   Pravastatin Other (See Comments)    Elevated blood pressure   Simvastatin Other (See Comments)    Muscle pain   Terazosin Other (See Comments)    Fatigue    Zetia [Ezetimibe] Other (See Comments)    Edema    Amoxicillin Palpitations    Chest pain   Mometasone Palpitations    Elevated blood pressure   Sulfa Antibiotics Rash and Hypertension     The results of significant diagnostics from this hospitalization (including imaging, microbiology, ancillary  and laboratory) are listed below for reference.   Consultations:   Procedures/Studies: DG Chest 2 View  Result Date: 01/06/2021 CLINICAL DATA:  Shortness of breath EXAM: CHEST - 2 VIEW COMPARISON:  01/03/2021 FINDINGS: Small bilateral pleural effusions. Slight  increased patchy airspace disease at the bases. Stable cardiomediastinal silhouette with aortic atherosclerosis. No pneumothorax. IMPRESSION: 1. Small bilateral pleural effusions with increasing atelectasis or infiltrates at the bases Electronically Signed   By: Donavan Foil M.D.   On: 01/06/2021 16:28   DG Chest 2 View  Result Date: 01/03/2021 CLINICAL DATA:  Dyspnea EXAM: CHEST - 2 VIEW COMPARISON:  02/01/2013 chest radiograph. FINDINGS: Stable cardiomediastinal silhouette with normal heart size. No pneumothorax. Trace bilateral pleural effusions. Hyperinflated lungs. No pulmonary edema. Mild streaky bibasilar lung opacities. Mild hazy opacity in the right mid lung. IMPRESSION: 1. Mild hazy opacity in the right mid lung, cannot exclude mild/developing pneumonia. Follow-up chest radiograph suggested. 2. Mild streaky bibasilar lung opacities, favor atelectasis. 3. Hyperinflated lungs, suggesting COPD. 4. Trace bilateral pleural effusions. Electronically Signed   By: Ilona Sorrel M.D.   On: 01/03/2021 07:59   DG Abd 1 View  Result Date: 01/07/2021 CLINICAL DATA:  Constipation EXAM: ABDOMEN - 1 VIEW COMPARISON:  None. FINDINGS: Air seen throughout much of the colon apart from rectum. There is no significant stool burden. Increased pelvic density may reflect bladder or stool within rectum. IMPRESSION: No significant stool burden. Increased pelvic density may reflect bladder or stool within rectum. Electronically Signed   By: Macy Mis M.D.   On: 01/07/2021 15:59   MR CERVICAL SPINE WO CONTRAST  Result Date: 01/05/2021 CLINICAL DATA:  Enterococcus bacteremia, neck pain, infection suspected EXAM: MRI CERVICAL AND THORACIC SPINE WITHOUT  CONTRAST TECHNIQUE: Multiplanar and multiecho pulse sequences of the cervical and thoracic spine were obtained without intravenous contrast. COMPARISON:  None. FINDINGS: Evaluation is somewhat limited by motion artifact. MRI CERVICAL SPINE FINDINGS Alignment: Physiologic. Vertebrae: No acute fracture or suspicious lesion. Evaluation for osteomyelitis is somewhat limited in the absence of intravenous contrast; however no increased T2 signal is seen in the endplates or intervertebral discs. 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 Cord: Normal signal and morphology. Posterior Fossa, vertebral arteries, paraspinal tissues: Negative. Disc levels: C2-C3: No significant disc bulge. Facet arthropathy. No spinal canal stenosis. Mild bilateral neural foraminal narrowing. C3-C4: Small left foraminal disc protrusion. Facet and uncovertebral hypertrophy. Severe left and moderate right neural foraminal narrowing. No spinal canal stenosis C4-C5: Moderate disc bulge. Facet arthropathy. Moderate spinal canal stenosis. Moderate bilateral neural foraminal narrowing. C5-C6: No significant disc bulge. Facet and uncovertebral hypertrophy. No spinal canal stenosis. Moderate bilateral neural foraminal narrowing. C6-C7: No significant disc bulge. Facet arthropathy. No spinal canal stenosis. Mild bilateral neural foraminal narrowing. C7-T1: No significant disc bulge. No spinal canal stenosis or neuroforaminal narrowing. MRI THORACIC SPINE FINDINGS Alignment:  Physiologic. Vertebrae: No acute fracture. Endplate degenerative changes T4-T5 without increased signal within the disc, favored to be degenerative. There is additional increased T2 signal at T6-T7, with increased T1 signal at the superior endplate, likely combination of Modic type 1 and type 2 degenerative changes, with increased T2 signal within the intervertebral disc (series 21, image 11), favored to be reactive. Cord:  Normal signal and  morphology. Paraspinal and other soft tissues: No paravertebral collection. Disc levels: Facet arthropathy in the lower thoracic spine, causes mild osseous canal narrowing without significant spinal canal stenosis. Moderate left neural foraminal narrowing at T9-T10 and moderate right neural foraminal narrowing at T9-T10 and T10-T11. Partially visualized lumbar spine demonstrates a chronic appearing compression deformity of L1. IMPRESSION: 1. No definite evidence of osteomyelitis discitis, although evaluation is somewhat limited by the absence of intravenous contrast. 2. C4-C5 moderate  spinal canal stenosis and moderate bilateral neural foraminal narrowing. 3. C3-C4 severe left and moderate right neural foraminal narrowing. 4. C5-C6 moderate bilateral neural foraminal narrowing. Electronically Signed   By: Merilyn Baba M.D.   On: 01/05/2021 17:43   MR THORACIC SPINE WO CONTRAST  Result Date: 01/05/2021 CLINICAL DATA:  Enterococcus bacteremia, neck pain, infection suspected EXAM: MRI CERVICAL AND THORACIC SPINE WITHOUT CONTRAST TECHNIQUE: Multiplanar and multiecho pulse sequences of the cervical and thoracic spine were obtained without intravenous contrast. COMPARISON:  None. FINDINGS: Evaluation is somewhat limited by motion artifact. MRI CERVICAL SPINE FINDINGS Alignment: Physiologic. Vertebrae: No acute fracture or suspicious lesion. Evaluation for osteomyelitis is somewhat limited in the absence of intravenous contrast; however no increased T2 signal is seen in the endplates or intervertebral discs. 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 Cord: Normal signal and morphology. Posterior Fossa, vertebral arteries, paraspinal tissues: Negative. Disc levels: C2-C3: No significant disc bulge. Facet arthropathy. No spinal canal stenosis. Mild bilateral neural foraminal narrowing. C3-C4: Small left foraminal disc protrusion. Facet and uncovertebral hypertrophy. Severe left and  moderate right neural foraminal narrowing. No spinal canal stenosis C4-C5: Moderate disc bulge. Facet arthropathy. Moderate spinal canal stenosis. Moderate bilateral neural foraminal narrowing. C5-C6: No significant disc bulge. Facet and uncovertebral hypertrophy. No spinal canal stenosis. Moderate bilateral neural foraminal narrowing. C6-C7: No significant disc bulge. Facet arthropathy. No spinal canal stenosis. Mild bilateral neural foraminal narrowing. C7-T1: No significant disc bulge. No spinal canal stenosis or neuroforaminal narrowing. MRI THORACIC SPINE FINDINGS Alignment:  Physiologic. Vertebrae: No acute fracture. Endplate degenerative changes T4-T5 without increased signal within the disc, favored to be degenerative. There is additional increased T2 signal at T6-T7, with increased T1 signal at the superior endplate, likely combination of Modic type 1 and type 2 degenerative changes, with increased T2 signal within the intervertebral disc (series 21, image 11), favored to be reactive. Cord:  Normal signal and morphology. Paraspinal and other soft tissues: No paravertebral collection. Disc levels: Facet arthropathy in the lower thoracic spine, causes mild osseous canal narrowing without significant spinal canal stenosis. Moderate left neural foraminal narrowing at T9-T10 and moderate right neural foraminal narrowing at T9-T10 and T10-T11. Partially visualized lumbar spine demonstrates a chronic appearing compression deformity of L1. IMPRESSION: 1. No definite evidence of osteomyelitis discitis, although evaluation is somewhat limited by the absence of intravenous contrast. 2. C4-C5 moderate spinal canal stenosis and moderate bilateral neural foraminal narrowing. 3. C3-C4 severe left and moderate right neural foraminal narrowing. 4. C5-C6 moderate bilateral neural foraminal narrowing. Electronically Signed   By: Merilyn Baba M.D.   On: 01/05/2021 17:43   US RENAL  Result Date: 01/04/2021 CLINICAL DATA:   Acute kidney injury EXAM: RENAL / URINARY TRACT ULTRASOUND COMPLETE COMPARISON:  None. FINDINGS: Right Kidney: Renal measurements: 11.6 x 4.3 x 4.3 cm = volume: 111 mL. Echogenicity within normal limits. No significant atrophy. No mass or hydronephrosis visualized. Left Kidney: Renal measurements: 12.5 x 4.9 x 4.8 cm = volume: 153 mL. No hydronephrosis. No significant atrophy. Simple cyst of the lower pole measures up to 2.2 cm and has a benign appearance. Bladder: Appears normal for degree of bladder distention. Other: None. IMPRESSION: No significant atrophy, hydronephrosis or evidence of abnormal renal echogenicity. Benign simple cyst of the left kidney. Electronically Signed   By: Aletta Edouard M.D.   On: 01/04/2021 09:38   PERIPHERAL VASCULAR CATHETERIZATION  Result Date: 01/20/2021 See surgical note for result.  DG Chest Port 1  View  Result Date: 01/10/2021 CLINICAL DATA:  Acute respiratory failure with hypoxia EXAM: PORTABLE CHEST 1 VIEW COMPARISON:  January 09, 2021 FINDINGS: Diffuse opacities identified throughout bilateral lungs. There are bilateral pleural effusions. Right pleural effusion with likely enlarged compared to prior exam. The mediastinal contour and cardiac silhouette are stable. Bony structures are stable. IMPRESSION: 1. Diffuse opacities identified throughout bilateral lungs concerning for pneumonia or edema unchanged compared prior exam. 2. Bilateral pleural effusions. Right pleural effusion is worsened compared prior exam. Electronically Signed   By: Abelardo Diesel M.D.   On: 01/10/2021 08:43   DG Chest Port 1 View  Result Date: 01/09/2021 CLINICAL DATA:  Acute respiratory failure with hypoxia. EXAM: PORTABLE CHEST 1 VIEW COMPARISON:  January 06, 2021. FINDINGS: The heart size and mediastinal contours are within normal limits. Significantly increased bilateral lung opacities are noted concerning for worsening pneumonia or edema. Small bilateral pleural effusions are noted.  The visualized skeletal structures are unremarkable. IMPRESSION: Increased bilateral lung opacities are noted concerning for worsening pneumonia or edema. Electronically Signed   By: Marijo Conception M.D.   On: 01/09/2021 09:49   ECHOCARDIOGRAM COMPLETE  Result Date: 01/04/2021    ECHOCARDIOGRAM REPORT   Patient Name:   Phillip Chavez Date of Exam: 01/04/2021 Medical Rec #:  [id number]      Height:       65.0 in Accession #:    [id number]     Weight:       172.0 lb Date of Birth:  [birth year]      BSA:          1.855 m Patient Age:    75 years       BP:           133/37 mmHg Patient Gender: M              HR:           76 bpm. Exam Location:  [facility] Procedure: 2D Echo, 3D Echo and Strain Analysis Indications:     CHF I50.31  History:         Patient has no prior history of Echocardiogram examinations.  Sonographer:     Kathlen Brunswick RDCS Referring Phys:  [id number] Soledad Gerlach NIU Diagnosing Phys: Kate Sable MD  Sonographer Comments: Global longitudinal strain was attempted. IMPRESSIONS  1. Left ventricular ejection fraction, by estimation, is 65 to 70%. The left ventricle has normal function. The left ventricle has no regional wall motion abnormalities. Left ventricular diastolic parameters were normal. The average left ventricular global longitudinal strain is -23.6 %. The global longitudinal strain is normal.  2. Right ventricular systolic function is normal. The right ventricular size is normal.  3. Left atrial size was mildly dilated.  4. Right atrial size was mildly dilated.  5. The mitral valve is normal in structure. Mild mitral valve regurgitation.  6. The aortic valve is calcified. Aortic valve regurgitation is mild. Mild aortic valve stenosis.  7. The inferior vena cava is dilated in size with >50% respiratory variability, suggesting right atrial pressure of 8 mmHg. FINDINGS  Left Ventricle: Left ventricular ejection fraction, by estimation, is 65 to 70%. The left ventricle has normal function. The left  ventricle has no regional wall motion abnormalities. The average left ventricular global longitudinal strain is -23.6 %. The global longitudinal strain is normal. 3D left ventricular ejection fraction analysis performed but not reported based on interpreter judgement due to suboptimal quality. The left ventricular internal cavity size  was normal in size. There is no left ventricular hypertrophy. Left ventricular diastolic parameters were normal. Right Ventricle: The right ventricular size is normal. No increase in right ventricular wall thickness. Right ventricular systolic function is normal. Left Atrium: Left atrial size was mildly dilated. Right Atrium: Right atrial size was mildly dilated. Pericardium: There is no evidence of pericardial effusion. Mitral Valve: The mitral valve is normal in structure. Mild mitral valve regurgitation. Tricuspid Valve: The tricuspid valve is normal in structure. Tricuspid valve regurgitation is trivial. Aortic Valve: The aortic valve is calcified. Aortic valve regurgitation is mild. Aortic regurgitation PHT measures 362 msec. Mild aortic stenosis is present. Aortic valve mean gradient measures 13.3 mmHg. Aortic valve peak gradient measures 28.9 mmHg. Aortic valve area, by VTI measures 1.95 cm. Pulmonic Valve: The pulmonic valve was not well visualized. Pulmonic valve regurgitation is not visualized. Aorta: The aortic root is normal in size and structure. Venous: The inferior vena cava is dilated in size with greater than 50% respiratory variability, suggesting right atrial pressure of 8 mmHg. IAS/Shunts: No atrial level shunt detected by color flow Doppler.  LEFT VENTRICLE PLAX 2D LVIDd:         5.50 cm   Diastology LVIDs:         3.04 cm   LV e' medial:    11.20 cm/s LV PW:         1.04 cm   LV E/e' medial:  10.9 LV IVS:        1.01 cm   LV e' lateral:   10.30 cm/s LVOT diam:     2.10 cm   LV E/e' lateral: 11.8 LV SV:         112 LV SV Index:   60        2D Longitudinal Strain  LVOT Area:     3.46 cm  2D Strain GLS Avg:     -23.6 %                           3D Volume EF:                          3D EF:        75 %                          LV EDV:       200 ml                          LV ESV:       51 ml                          LV SV:        149 ml RIGHT VENTRICLE RV Basal diam:  3.59 cm RV S prime:     20.20 cm/s TAPSE (M-mode): 3.7 cm LEFT ATRIUM             Index        RIGHT ATRIUM           Index LA diam:        4.60 cm 2.48 cm/m   RA Area:     19.30 cm LA Vol (A2C):   50.9 ml 27.43 ml/m  RA Volume:   66.30 ml  35.74 ml/m LA  Vol (A4C):   73.2 ml 39.45 ml/m LA Biplane Vol: 63.8 ml 34.39 ml/m  AORTIC VALVE                     PULMONIC VALVE AV Area (Vmax):    2.20 cm      PV Vmax:       0.94 m/s AV Area (Vmean):   2.09 cm      PV Peak grad:  3.5 mmHg AV Area (VTI):     1.95 cm AV Vmax:           268.67 cm/s AV Vmean:          163.333 cm/s AV VTI:            0.576 m AV Peak Grad:      28.9 mmHg AV Mean Grad:      13.3 mmHg LVOT Vmax:         171.00 cm/s LVOT Vmean:        98.500 cm/s LVOT VTI:          0.324 m LVOT/AV VTI ratio: 0.56 AI PHT:            362 msec  AORTA Ao Root diam: 3.00 cm MITRAL VALVE                TRICUSPID VALVE MV Area (PHT): 3.66 cm     TV [place] grad:   41.7 mmHg MV Decel Time: 207 msec     TV Vmax:        3.23 m/s MV E velocity: 122.00 cm/s MV A velocity: 89.40 cm/s   SHUNTS MV E/A ratio:  1.36         Systemic VTI:  0.32 m                             Systemic Diam: 2.10 cm Kate Sable MD Electronically signed by Kate Sable MD Signature Date/Time: 01/04/2021/1:36:54 PM    Final    ECHO TEE  Result Date: 01/07/2021    TRANSESOPHOGEAL ECHO REPORT   Patient Name:   Phillip Chavez Date of Exam: 01/06/2021 Medical Rec #:  [id number]      Height:       65.0 in Accession #:    [id number]     Weight:       176.1 lb Date of Birth:  September 22, 1940      BSA:          1.874 m Patient Age:    7 years       BP:           120/49 mmHg Patient Gender: M               HR:           79 bpm. Exam Location:  [facility] Procedure: Transesophageal Echo, Color Doppler, Cardiac Doppler and Saline            Contrast Bubble Study Indications:     bacteremia, r/o endocarditis, aortic valve disorder  History:         Patient has prior history of Echocardiogram examinations, most                  recent 01/04/2021. CHF, COPD; Risk Factors:Hypertension and                  Dyslipidemia.  Sonographer:     Sherrie Sport Referring  Phys:  24 MUHAMMAD A ARIDA Diagnosing Phys: Ida Rogue MD PROCEDURE: After discussion of the risks and benefits of a TEE, an informed consent was obtained from the patient. TEE procedure time was 30 minutes. The transesophogeal probe was passed without difficulty through the esophogus of the patient. Imaged were obtained with the patient in a left lateral decubitus position. Local oropharyngeal anesthetic was provided with Benzocaine spray and Cetacaine. Sedation performed by performing physician. Image quality was excellent. The patient's vital signs; including heart rate, blood pressure, and oxygen saturation; remained stable throughout the procedure. The patient developed no complications during the procedure. IMPRESSIONS  1. No valve endocarditis noted.  2. Left ventricular ejection fraction, by estimation, is 60 to 65%. The left ventricle has normal function. The left ventricle has no regional wall motion abnormalities.  3. Right ventricular systolic function is normal. The right ventricular size is normal. There is moderately elevated pulmonary artery systolic pressure. The estimated right ventricular systolic pressure is 70.2 mmHg.  4. Left atrial size was moderately dilated. No left atrial/left atrial appendage thrombus was detected.  5. Right atrial size was mildly dilated.  6. The mitral valve is normal in structure. Mild mitral valve regurgitation. No evidence of mitral stenosis.  7. Tricuspid valve regurgitation is moderate to severe.  8. 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  9. 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 No aortic stenosis is present. 10. There is mild to moderate (Grade II-III) plaque involving the descending aorta. 11. Agitated saline contrast bubble study was negative, with no evidence of any interatrial shunt. Conclusion(s)/Recommendation(s): Normal biventricular function without evidence of hemodynamically significant valvular heart disease. FINDINGS  Left Ventricle: Left ventricular ejection fraction, by estimation, is 60 to 65%. The left ventricle has normal function. The left ventricle has no regional wall motion abnormalities. The left ventricular internal cavity size was normal in size. There is  no left ventricular hypertrophy. Right Ventricle: The right ventricular size is normal. No increase in right ventricular wall thickness. Right ventricular systolic function is normal. There is moderately elevated pulmonary artery systolic pressure. The tricuspid regurgitant velocity is 3.41 m/s, and with an assumed right atrial pressure of 10 mmHg, the estimated right ventricular systolic pressure is 63.7 mmHg. Left Atrium: Left atrial size was moderately dilated. No left atrial/left atrial appendage thrombus was detected. Right Atrium: Right atrial size was mildly dilated. Pericardium: There is no evidence of pericardial effusion. Mitral Valve: The mitral valve is normal in structure. Mild mitral valve regurgitation. No evidence of mitral valve stenosis. Tricuspid Valve: The tricuspid valve is normal in structure. Tricuspid valve regurgitation is moderate to severe. No evidence of tricuspid stenosis. Aortic Valve: The aortic valve is tricuspid. 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 No aortic stenosis is present. Pulmonic Valve: The pulmonic valve was normal in structure. Pulmonic valve regurgitation is not  visualized. No evidence of pulmonic stenosis. Aorta: The aortic root is normal in size and structure. There is moderate (Grade III) plaque involving the descending aorta. Venous: The inferior vena cava is normal in size with greater than 50% respiratory variability, suggesting right atrial pressure of 3 mmHg. IAS/Shunts: No atrial level shunt detected by color flow Doppler. Agitated saline contrast was given intravenously to evaluate for intracardiac shunting. Agitated saline contrast bubble study was negative, with no evidence of any interatrial shunt. There  is no evidence of a patent foramen ovale.  There is no evidence of an atrial septal defect.  TRICUSPID VALVE TR [place] grad:   46.5 mmHg TR Vmax:        341.00 cm/s Ida Rogue MD Electronically signed by Ida Rogue MD Signature Date/Time: 01/07/2021/11:27:44 AM    Final       Labs: BNP (last 3 results) Recent Labs    01/03/21 0605 01/09/21 0543  BNP 473.6* 3,664.4*   Basic Metabolic Panel: Recent Labs  Lab 01/18/21 0441 01/19/21 0421 01/20/21 0234 01/22/21 0332 01/23/21 0407 01/24/21 0452  NA 135 133* 132* 135 134* 136  K 4.3  4.3 4.7 4.6 4.3 4.3 4.2  CL 96* 96* 95* 98 98 99  CO2 _0 GLUCOSE 123* 115* 140* 105* 107* 85  BUN 100* 117* 61* 52* 75* 47*  CREATININE 7.05* 7.86* 4.78* 4.25* 5.86* 4.54*  CALCIUM 7.9* 7.8* 7.8* 8.0* 7.7* 8.1*  MG 2.4  --   --  1.9 2.0 1.9   Liver Function Tests: No results for input(s): AST, ALT, ALKPHOS, BILITOT, PROT, ALBUMIN in the last 168 hours. No results for input(s): LIPASE, AMYLASE in the last 168 hours. No results for input(s): AMMONIA in the last 168 hours. CBC: Recent Labs  Lab 01/20/21 0234 01/22/21 0332 01/23/21 0407 01/24/21 0452  WBC 11.8* 12.6* 12.2* 15.0*  HGB 11.2* 11.7* 9.6* 10.1*  HCT 32.8* 35.6* 29.2* 31.1*  MCV 85.0 86.8 86.9 88.9  PLT 149* 87* 66* 65*   Cardiac Enzymes: No results for input(s): CKTOTAL, CKMB, CKMBINDEX, TROPONINI in the last  168 hours. BNP: Invalid input(s): POCBNP CBG: Recent Labs  Lab 01/17/21 0953 01/17/21 1242 01/17/21 1622 01/17/21 2049  GLUCAP 104* 175* 201* 160*   D-Dimer No results for input(s): DDIMER in the last 72 hours. Hgb A1c No results for input(s): HGBA1C in the last 72 hours. Lipid Profile No results for input(s): CHOL, HDL, LDLCALC, TRIG, CHOLHDL, LDLDIRECT in the last 72 hours. Thyroid function studies No results for input(s): TSH, T4TOTAL, T3FREE, THYROIDAB in the last 72 hours.  Invalid input(s): FREET3 Anemia work up No results for input(s): VITAMINB12, FOLATE, FERRITIN, TIBC, IRON, RETICCTPCT in the last 72 hours. Urinalysis    Component Value Date/Time   COLORURINE YELLOW (A) 01/09/2021 0952   APPEARANCEUR CLOUDY (A) 01/09/2021 0952   LABSPEC 1.023 01/09/2021 0952   PHURINE 5.0 01/09/2021 0952   GLUCOSEU 50 (A) 01/09/2021 0952   HGBUR MODERATE (A) 01/09/2021 0952   BILIRUBINUR NEGATIVE 01/09/2021 0952   KETONESUR 5 (A) 01/09/2021 0952   PROTEINUR 30 (A) 01/09/2021 0952   NITRITE NEGATIVE 01/09/2021 0952   LEUKOCYTESUR NEGATIVE 01/09/2021 0952   Sepsis Labs Invalid input(s): PROCALCITONIN,  WBC,  LACTICIDVEN Microbiology No results found for this or any previous visit (from the past 240 hour(s)).   Total time spend on discharging this patient, including the last patient exam, discussing the hospital stay, instructions for ongoing care as it relates to all pertinent caregivers, as well as preparing the medical discharge records, prescriptions, and/or referrals as applicable, is 30 minutes.    Enzo Bi, MD  [facility] 01/24/2021, 9:34 AM

## 2021-01-24 NOTE — TOC Progression Note (Signed)
[facility]) - Progression Note    Patient Details  Name: Phillip Chavez MRN: [id number] Date of Birth: 08-04-1940  [facility]) CM/SW Contact  Izola Price, RN Phone Number: 01/24/2021, 12:05 PM  Clinical Narrative:  Patient to discharge to [place] today. Confirmed with Army Melia at [facility]. DC Summary unboxed per request of Otila Kluver. Attempted to contact spouse with contact information in chart as well as the room number with no answer. Left VM on mobile phone. In rounds, discussed with Unit RN to let her know that she should just wait till patient ready for transport and meet at facility. Unit RN said she has some concerns about signing paperwork at facility and referred to [facility]. Patient will go to Room 600A and report number given to Unit RN.  [facility] transport will be needed. Med. Nec.form and Facesheet printed to unit for Unit RN to give to transport. Simmie Davies RN CM     Expected Discharge Plan: Skilled Nursing Facility Barriers to Discharge: Waiting for outpatient dialysis  Expected Discharge Plan and Services Expected Discharge Plan: [place] Choice: [place] arrangements for the past 2 months: Single Family Home Expected Discharge Date: 01/24/21               DME Arranged: [place]ford Rile rolling DME Agency: [employer]       HH Arranged: PT, RN [place] Agency: [place] ([place]) Date HH Agency Contacted: 01/07/21 Time [place]: [phone] Representative spoke with at [place]: [place] ([place]) Interventions    Readmission Risk Interventions No flowsheet data found.

## 2021-01-24 NOTE — Progress Notes (Signed)
   01/24/21 0305  Neurological  Neuro (WDL) X  Orientation Level Oriented X4  Cognition Poor safety awareness  Speech Clear  Pupil Assessment  Yes  R Pupil Size (mm) 3  R Pupil Shape Round  R Pupil Reaction Brisk  L Pupil Size (mm) 3  L Pupil Shape Round  L Pupil Reaction Brisk  R Extraocular Movement 4  L Extraocular Movement 4  R Hand Grip Present;Strong  L Hand Grip Present;Strong  R Foot Dorsiflexion Present;Strong  L Foot Dorsiflexion Present;Strong  R Foot Plantar Flexion Present;Strong  L Foot Plantar Flexion Present;Strong  RUE Motor Response Responds to commands  RUE Sensation Full sensation  LUE Motor Response Responds to commands  LUE Sensation Full sensation  RLE Motor Response Responds to commands  RLE Sensation Full sensation  LLE Motor Response Responds to commands  LLE Sensation Full sensation  Neuro Symptoms None  Glasgow Coma Scale  Eye Opening 4  Best Verbal Response (NON-intubated) 5  Best Motor Response 6  Glasgow Coma Scale Score 15  Musculoskeletal  Musculoskeletal (WDL) X  Assistive Device Front wheel walker

## 2021-01-24 NOTE — Progress Notes (Signed)
Patient departed with EMS.

## 2021-01-24 NOTE — Progress Notes (Signed)
Report called to [facility] to Legrand Como who verbalized understanding and acceptance.  Wife and son at bedside, notified of patient discharging to [facility].  Discharge packet to be taken with EMS. Piv x2 removed prior to transport.  Awaiting EMS to transport.

## 2021-01-24 NOTE — Progress Notes (Signed)
   01/24/21 0252  What Happened  Was fall witnessed? Yes  Who witnessed fall? Anderson Malta Wade,NT  Patients activity before fall ambulating-unassisted  Was patient injured? Unsure  Follow Up  MD notified Anne Ng  Time MD notified 0300  Adult Fall Risk Assessment  Risk Factor Category (scoring not indicated) High fall risk per protocol (document High fall risk)  Age 80  Fall History: Fall within 6 months prior to admission 5  Elimination; Bowel and/or Urine Incontinence 2  Elimination; Bowel and/or Urine Urgency/Frequency 2  Medications: includes PCA/Opiates, Anti-convulsants, Anti-hypertensives, Diuretics, Hypnotics, Laxatives, Sedatives, and Psychotropics 3  Patient Care Equipment 1  Mobility-Assistance 2  Mobility-Gait 2  Mobility-Sensory Deficit 0  Altered awareness of immediate physical environment 1  Impulsiveness 2  Lack of understanding of one's physical/cognitive limitations 4  Total Score 27  Patient Fall Risk Level High fall risk  Adult Fall Risk Interventions  Required Bundle Interventions *See Row Information* High fall risk - low, moderate, and high requirements implemented  Additional Interventions Room near nurses station  Screening for Fall Injury Risk (To be completed on HIGH fall risk patients) - Assessing Need for Floor Mats  Risk For Fall Injury- Criteria for Floor Mats None identified - No additional interventions needed  Will Implement Floor Mats Yes  Vitals  Temp 98.1 F (36.7 C)  Temp Source Oral  BP (!) 128/32  MAP (mmHg) (!) 60  BP Location Left Arm  BP Method Automatic  Patient Position (if appropriate) Sitting  Pulse Rate 94  Pulse Rate Source Monitor  Resp 16  Oxygen Therapy  SpO2 96 %  O2 Device Nasal Cannula  O2 Flow Rate (L/min) 2 L/min  Pain Assessment  Pain Scale 0-10  Pain Score 0  PAINAD (Pain Assessment in Advanced Dementia)  Breathing 0  Negative Vocalization 0  Facial Expression 0  Body Language 0  Consolability 0   PAINAD Score 0  Integumentary  Integumentary (WDL) WDL  Pain Assessment  Work-Related Injury No  Pain Screening  Clinical Progression Not changed

## 2021-01-24 NOTE — TOC Transition Note (Signed)
Transition of Care [facility]) - CM/SW Discharge Note   Patient Details  Name: Phillip Chavez MRN: [id number] Date of Birth: 1940/10/30  [facility]) CM/SW Contact:  Phillip Price, RN Phone Number: 01/24/2021, 12:53 PM   Clinical Narrative:  Patient to be discharged today and transported to [facility] for STR. AC Army Melia at [employer] confirmed acceptance of patient discharge/transfer to [facility]. Patient going to room 605A. Report called to [phone]. DC Summary to inbox.  Left VM with spouse of transport to facility today. RN CM also notified spouse on 11/25 of transfer to [place] today 01/24/21 via CM notes and per Dr. Lolita Cram. [facility] forms printed to unit and Unit RN notified. [facility] contacted for convalescent transport to facility. [facility] was only special transport needs and relayed to [facility].  Simmie Davies RN CM     Final next level of care: Skilled Nursing Facility Barriers to Discharge: Barriers Resolved   Patient Goals and CMS Choice Patient states their goals for this hospitalization and ongoing recovery are:: to go home CMS Medicare.gov Compare Post Acute Care list provided to:: Patient Represenative (must comment) (spouse) Choice offered to / list presented to : Spouse  Discharge Placement              Patient chooses bed at: [facility] [place] Patient to be transferred to facility by: [facility] transport notified at 1250 pm 01/24/21. Name of family member notified: Left VM wtih spouse,Ryther,Phillip Chavez (Spouse)   [phone] [facility] Phone). She has been informed on 01/23/21 by RN CM of transfer today. Patient and family notified of of transfer: 01/23/21 (Per CM notes, spouse notified via RN CM 11/25 of transfer. Also left VM today on spouse mobile number of transfer today. Provider, Dr. Enzo Bi, updated as well)  Discharge Plan and Services     Post Acute Care Choice: Home Health          DME Arranged: [place] rolling DME Agency: NA       HH Arranged: NA HH  Agency: NA Date HH Agency Contacted: 01/07/21 Time [place]: 0909 Representative spoke with at [place]: [place] ([place]) Interventions     Readmission Risk Interventions No flowsheet data found.

## 2021-01-24 NOTE — Progress Notes (Signed)
Patient became confused during the shift and was attempting to look for his wife on several occasions making attempts to get out of bed without assistance. Phillip Chavez, NT was responding to the bed alarm and she noticed the patient ambulating and once she began to approach him, he broke plane and went down to the floor. This fall was witnessed and his head was not impacted.  Patient remains alert and cognition is at his baseline. He is able to deny pain. Vitals are WNL and no musculoskeletal abnormalities are noted. MD is notified and no new orders. His wife Phillip Chavez was also updated on the occurrence and is understanding of the situation. Patient is moved to room 215 in an effort to be closer to nursing staff. Patient is currently resting in bed at this time in no acute distress.

## 2021-01-29 HISTORY — PX: AORTIC VALVE REPLACEMENT (AVR)/CORONARY ARTERY BYPASS GRAFTING (CABG): SHX5725

## 2021-01-29 HISTORY — PX: AORTIC VALVE REPLACEMENT: SHX41

## 2021-02-05 ENCOUNTER — Inpatient Hospital Stay
Admission: EM | Admit: 2021-02-05 | Discharge: 2021-02-09 | DRG: 189 | Disposition: A | Discharge status: Skilled Nursing Facility | Payer: No Typology Code available for payment source | Source: Skilled Nursing Facility | Attending: Hospitalist | Admitting: Hospitalist

## 2021-02-05 ENCOUNTER — Other Ambulatory Visit: Payer: Self-pay

## 2021-02-05 ENCOUNTER — Inpatient Hospital Stay: Payer: No Typology Code available for payment source

## 2021-02-05 ENCOUNTER — Emergency Department: Payer: No Typology Code available for payment source

## 2021-02-05 DIAGNOSIS — G4733 Obstructive sleep apnea (adult) (pediatric): Secondary | ICD-10-CM | POA: Diagnosis present

## 2021-02-05 DIAGNOSIS — R7881 Bacteremia: Secondary | ICD-10-CM | POA: Diagnosis present

## 2021-02-05 DIAGNOSIS — Z8616 Personal history of COVID-19: Secondary | ICD-10-CM

## 2021-02-05 DIAGNOSIS — E785 Hyperlipidemia, unspecified: Secondary | ICD-10-CM | POA: Diagnosis present

## 2021-02-05 DIAGNOSIS — Z881 Allergy status to other antibiotic agents status: Secondary | ICD-10-CM

## 2021-02-05 DIAGNOSIS — F419 Anxiety disorder, unspecified: Secondary | ICD-10-CM | POA: Diagnosis present

## 2021-02-05 DIAGNOSIS — J441 Chronic obstructive pulmonary disease with (acute) exacerbation: Secondary | ICD-10-CM

## 2021-02-05 DIAGNOSIS — N390 Urinary tract infection, site not specified: Secondary | ICD-10-CM | POA: Diagnosis present

## 2021-02-05 DIAGNOSIS — N186 End stage renal disease: Secondary | ICD-10-CM | POA: Diagnosis present

## 2021-02-05 DIAGNOSIS — E8770 Fluid overload, unspecified: Secondary | ICD-10-CM | POA: Diagnosis present

## 2021-02-05 DIAGNOSIS — B952 Enterococcus as the cause of diseases classified elsewhere: Secondary | ICD-10-CM | POA: Diagnosis present

## 2021-02-05 DIAGNOSIS — E876 Hypokalemia: Secondary | ICD-10-CM | POA: Diagnosis present

## 2021-02-05 DIAGNOSIS — K219 Gastro-esophageal reflux disease without esophagitis: Secondary | ICD-10-CM | POA: Diagnosis present

## 2021-02-05 DIAGNOSIS — B379 Candidiasis, unspecified: Secondary | ICD-10-CM | POA: Diagnosis present

## 2021-02-05 DIAGNOSIS — I2510 Atherosclerotic heart disease of native coronary artery without angina pectoris: Secondary | ICD-10-CM | POA: Diagnosis present

## 2021-02-05 DIAGNOSIS — N2581 Secondary hyperparathyroidism of renal origin: Secondary | ICD-10-CM | POA: Diagnosis present

## 2021-02-05 DIAGNOSIS — I351 Nonrheumatic aortic (valve) insufficiency: Secondary | ICD-10-CM | POA: Diagnosis present

## 2021-02-05 DIAGNOSIS — Z8042 Family history of malignant neoplasm of prostate: Secondary | ICD-10-CM

## 2021-02-05 DIAGNOSIS — Z886 Allergy status to analgesic agent status: Secondary | ICD-10-CM | POA: Diagnosis not present

## 2021-02-05 DIAGNOSIS — U071 COVID-19: Secondary | ICD-10-CM

## 2021-02-05 DIAGNOSIS — J449 Chronic obstructive pulmonary disease, unspecified: Secondary | ICD-10-CM | POA: Diagnosis present

## 2021-02-05 DIAGNOSIS — I248 Other forms of acute ischemic heart disease: Secondary | ICD-10-CM | POA: Diagnosis present

## 2021-02-05 DIAGNOSIS — Z992 Dependence on renal dialysis: Secondary | ICD-10-CM

## 2021-02-05 DIAGNOSIS — L89890 Pressure ulcer of other site, unstageable: Secondary | ICD-10-CM | POA: Diagnosis present

## 2021-02-05 DIAGNOSIS — Z885 Allergy status to narcotic agent status: Secondary | ICD-10-CM

## 2021-02-05 DIAGNOSIS — I120 Hypertensive chronic kidney disease with stage 5 chronic kidney disease or end stage renal disease: Secondary | ICD-10-CM | POA: Diagnosis present

## 2021-02-05 DIAGNOSIS — Z882 Allergy status to sulfonamides status: Secondary | ICD-10-CM

## 2021-02-05 DIAGNOSIS — R0602 Shortness of breath: Secondary | ICD-10-CM | POA: Diagnosis present

## 2021-02-05 DIAGNOSIS — D631 Anemia in chronic kidney disease: Secondary | ICD-10-CM | POA: Diagnosis present

## 2021-02-05 DIAGNOSIS — Z800 Family history of malignant neoplasm of digestive organs: Secondary | ICD-10-CM

## 2021-02-05 DIAGNOSIS — J9601 Acute respiratory failure with hypoxia: Secondary | ICD-10-CM | POA: Diagnosis present

## 2021-02-05 DIAGNOSIS — R778 Other specified abnormalities of plasma proteins: Secondary | ICD-10-CM | POA: Diagnosis present

## 2021-02-05 DIAGNOSIS — Z888 Allergy status to other drugs, medicaments and biological substances status: Secondary | ICD-10-CM

## 2021-02-05 DIAGNOSIS — D649 Anemia, unspecified: Secondary | ICD-10-CM

## 2021-02-05 DIAGNOSIS — L8990 Pressure ulcer of unspecified site, unspecified stage: Secondary | ICD-10-CM | POA: Insufficient documentation

## 2021-02-05 LAB — CBC WITH DIFFERENTIAL/PLATELET
Abs Immature Granulocytes: 0.03 10*3/uL (ref 0.00–0.07)
Basophils Absolute: 0 10*3/uL (ref 0.0–0.1)
Basophils Relative: 1 %
Eosinophils Absolute: 0.2 10*3/uL (ref 0.0–0.5)
Eosinophils Relative: 4 %
HCT: 23.8 % — ABNORMAL LOW (ref 39.0–52.0)
Hemoglobin: 7.6 g/dL — ABNORMAL LOW (ref 13.0–17.0)
Immature Granulocytes: 1 %
Lymphocytes Relative: 22 %
Lymphs Abs: 1.5 10*3/uL (ref 0.7–4.0)
MCH: 28.7 pg (ref 26.0–34.0)
MCHC: 31.9 g/dL (ref 30.0–36.0)
MCV: 89.8 fL (ref 80.0–100.0)
Monocytes Absolute: 0.9 10*3/uL (ref 0.1–1.0)
Monocytes Relative: 13 %
Neutro Abs: 4 10*3/uL (ref 1.7–7.7)
Neutrophils Relative %: 59 %
Platelets: 145 10*3/uL — ABNORMAL LOW (ref 150–400)
RBC: 2.65 MIL/uL — ABNORMAL LOW (ref 4.22–5.81)
RDW: 17 % — ABNORMAL HIGH (ref 11.5–15.5)
WBC: 6.6 10*3/uL (ref 4.0–10.5)
nRBC: 0 % (ref 0.0–0.2)

## 2021-02-05 LAB — URINALYSIS, COMPLETE (UACMP) WITH MICROSCOPIC
Bacteria, UA: NONE SEEN
Glucose, UA: NEGATIVE mg/dL
Nitrite: NEGATIVE
Protein, ur: >300 mg/dL — AB
RBC / HPF: >50 RBC/hpf (ref 0–5)
Specific Gravity, Urine: 1.02 (ref 1.005–1.030)
WBC, UA: >50 WBC/hpf (ref 0–5)
pH: 7 (ref 5.0–8.0)

## 2021-02-05 LAB — FOLATE: Folate: 20 ng/mL (ref >5.9)

## 2021-02-05 LAB — RESP PANEL BY RT-PCR (FLU A&B, COVID) ARPGX2
Influenza A by PCR: NEGATIVE
Influenza B by PCR: NEGATIVE
SARS Coronavirus 2 by RT PCR: POSITIVE — AB

## 2021-02-05 LAB — COMPREHENSIVE METABOLIC PANEL
ALT: 112 U/L — ABNORMAL HIGH (ref 0–44)
AST: 66 U/L — ABNORMAL HIGH (ref 15–41)
Albumin: 2 g/dL — ABNORMAL LOW (ref 3.5–5.0)
Alkaline Phosphatase: 94 U/L (ref 38–126)
Anion gap: 10 (ref 5–15)
BUN: 12 mg/dL (ref 8–23)
CO2: 27 mmol/L (ref 22–32)
Calcium: 7.6 mg/dL — ABNORMAL LOW (ref 8.9–10.3)
Chloride: 98 mmol/L (ref 98–111)
Creatinine, Ser: 3.13 mg/dL — ABNORMAL HIGH (ref 0.61–1.24)
GFR, Estimated: 19 mL/min — ABNORMAL LOW (ref >60)
Glucose, Bld: 90 mg/dL (ref 70–99)
Potassium: 3 mmol/L — ABNORMAL LOW (ref 3.5–5.1)
Sodium: 135 mmol/L (ref 135–145)
Total Bilirubin: 0.7 mg/dL (ref 0.3–1.2)
Total Protein: 6.1 g/dL — ABNORMAL LOW (ref 6.5–8.1)

## 2021-02-05 LAB — RETICULOCYTES
Immature Retic Fract: 19.2 % — ABNORMAL HIGH (ref 2.3–15.9)
RBC.: 2.63 MIL/uL — ABNORMAL LOW (ref 4.22–5.81)
Retic Count, Absolute: 38.9 10*3/uL (ref 19.0–186.0)
Retic Ct Pct: 1.5 % (ref 0.4–3.1)

## 2021-02-05 LAB — VITAMIN B12: Vitamin B-12: 521 pg/mL (ref 180–914)

## 2021-02-05 LAB — LACTIC ACID, PLASMA: Lactic Acid, Venous: 1.3 mmol/L (ref 0.5–1.9)

## 2021-02-05 LAB — TROPONIN I (HIGH SENSITIVITY)
Troponin I (High Sensitivity): 114 ng/L (ref <18)
Troponin I (High Sensitivity): 95 ng/L — ABNORMAL HIGH (ref <18)

## 2021-02-05 LAB — IRON AND TIBC
Iron: 14 ug/dL — ABNORMAL LOW (ref 45–182)
Saturation Ratios: 7 % — ABNORMAL LOW (ref 17.9–39.5)
TIBC: 189 ug/dL — ABNORMAL LOW (ref 250–450)
UIBC: 175 ug/dL

## 2021-02-05 LAB — MAGNESIUM: Magnesium: 1.6 mg/dL — ABNORMAL LOW (ref 1.7–2.4)

## 2021-02-05 LAB — BRAIN NATRIURETIC PEPTIDE: B Natriuretic Peptide: 1779.2 pg/mL — ABNORMAL HIGH (ref 0.0–100.0)

## 2021-02-05 LAB — PROCALCITONIN: Procalcitonin: 0.32 ng/mL

## 2021-02-05 LAB — FERRITIN: Ferritin: 488 ng/mL — ABNORMAL HIGH (ref 24–336)

## 2021-02-05 IMAGING — CT CT HEAD W/O CM
4 series · 17 of 47 positions shown, 19 images · non-contrast
Comparison: None.

CLINICAL DATA: Unwitnessed fall.

EXAM:
CT HEAD WITHOUT CONTRAST
TECHNIQUE: Contiguous axial images were obtained from the base of the skull
through the vertex without intravenous contrast.

[Series 2: head wo · axial · 0.44mm/px · z∈[+204,+314]mm · 7 of 30 slices shown, 9 images]
[im 4/30  brain]
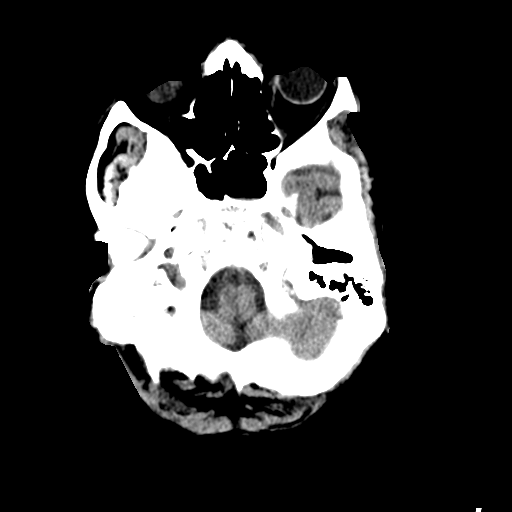
[im 4/30  bone]
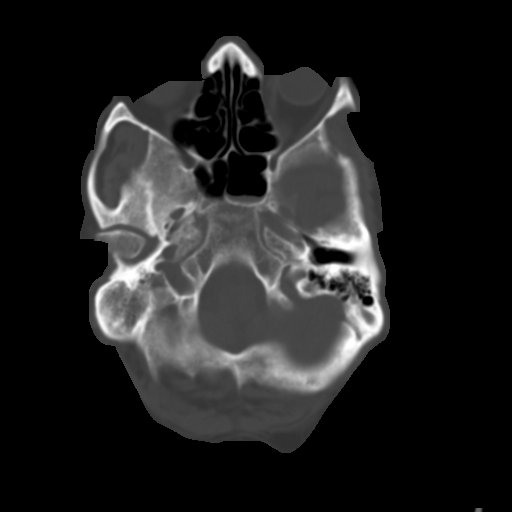
[im 8/30  brain]
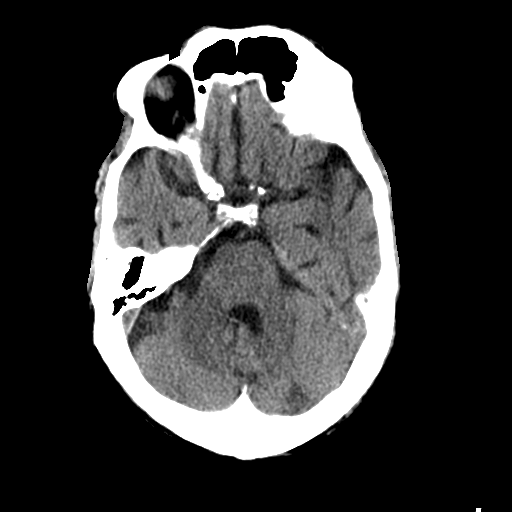
[im 11/30  brain]
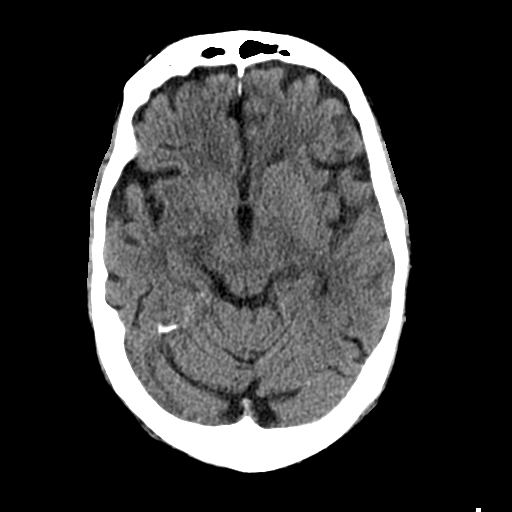
[im 15/30  brain]
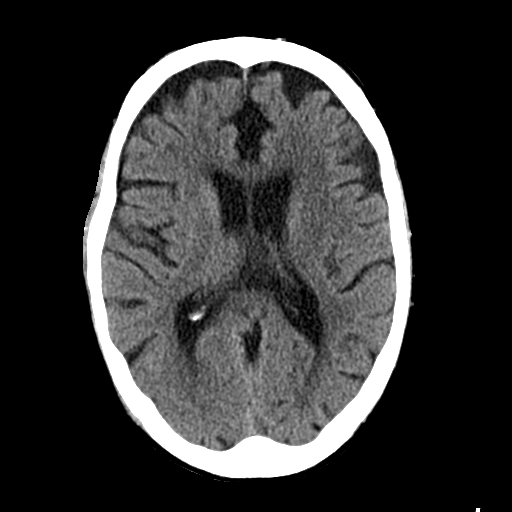
[im 19/30  brain]
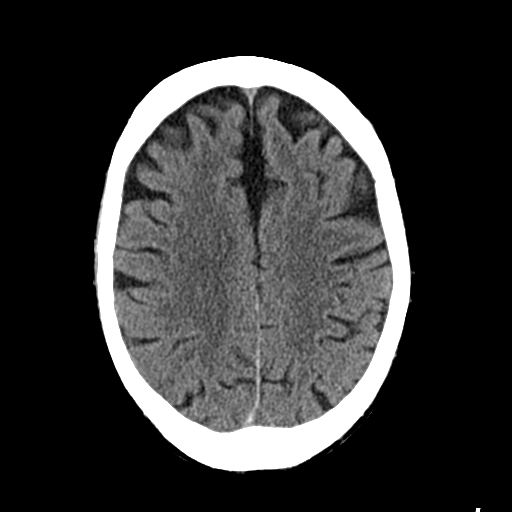
[im 19/30  bone]
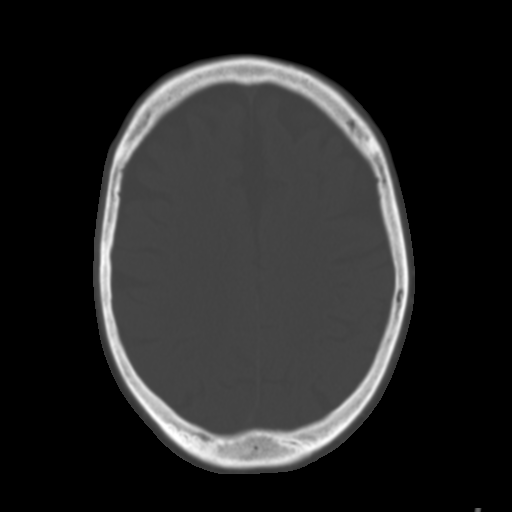
[im 22/30  brain]
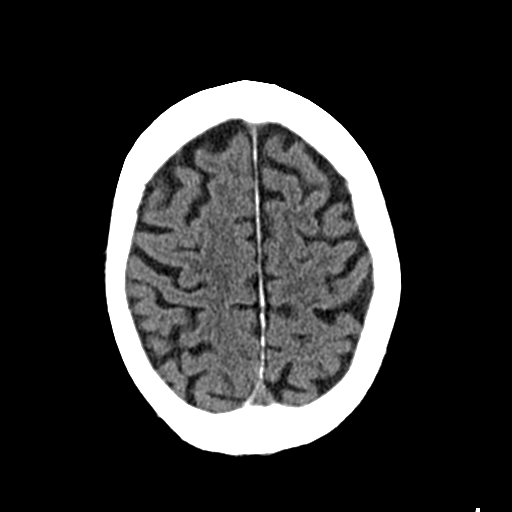
[im 26/30  brain]
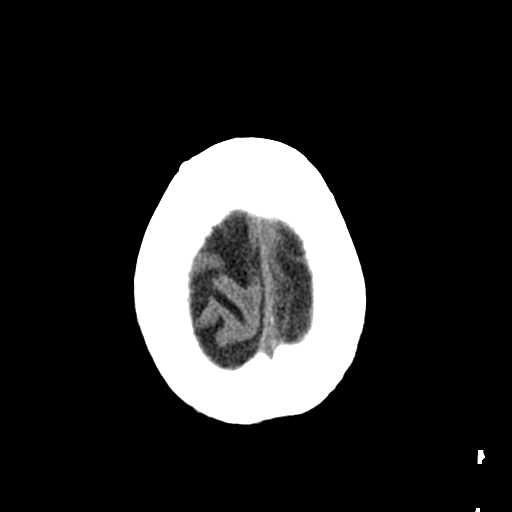

[Series 3: head bone · axial · 0.44mm/px · z∈[+203,+255]mm · 4 of 75 slices shown]
[im 8/75  bone]
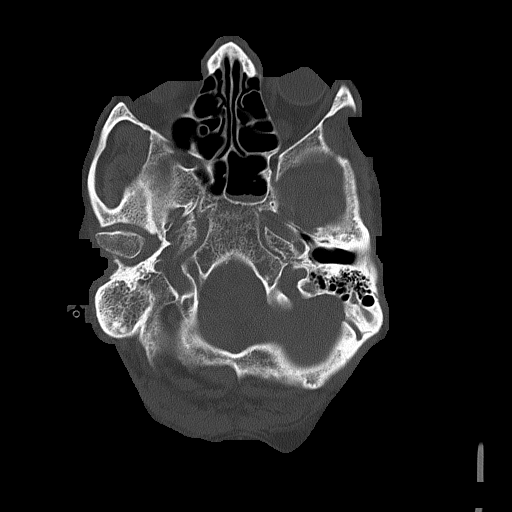
[im 15/75  bone]
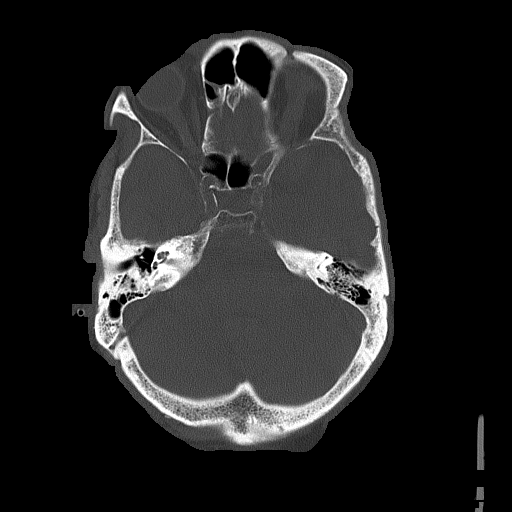
[im 23/75  bone]
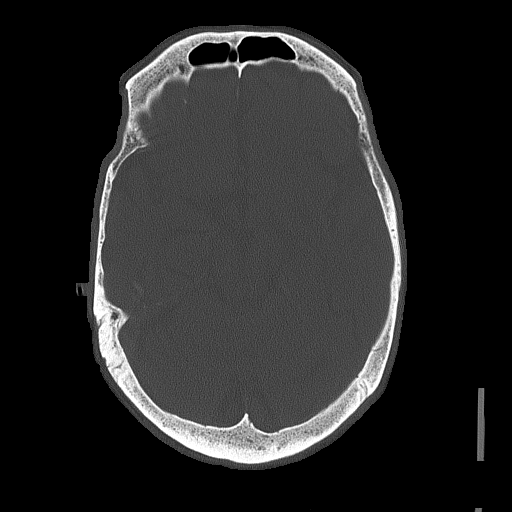
[im 34/75  bone]
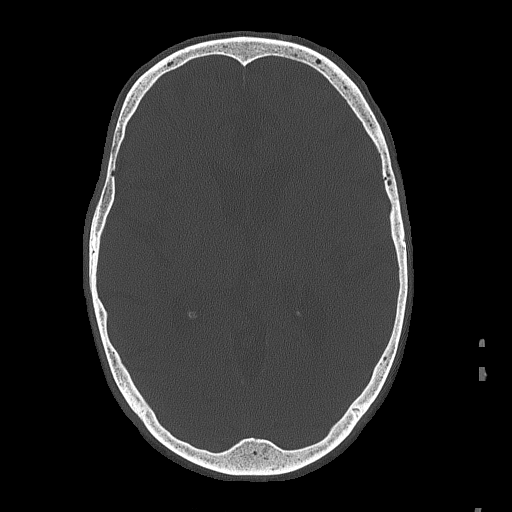

[Series 4: coronal soft tissue · coronal · 0.31mm/px · 3 of 68 slices shown]
[im 23/68  brain]
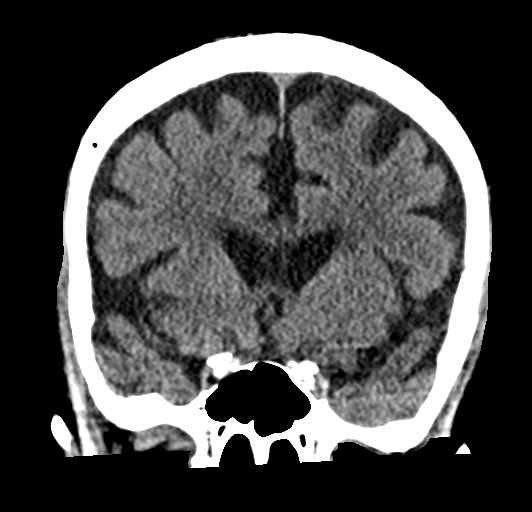
[im 30/68  brain]
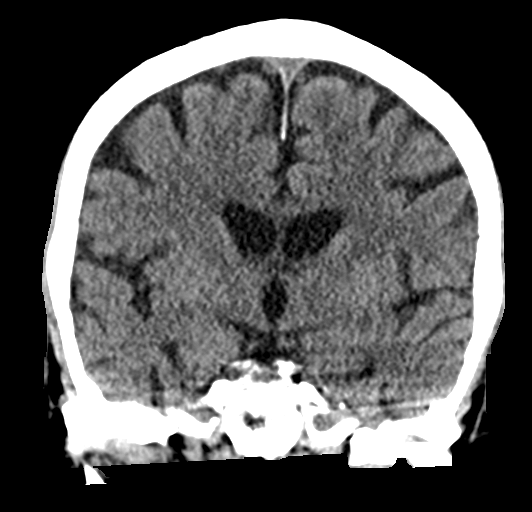
[im 38/68  brain]
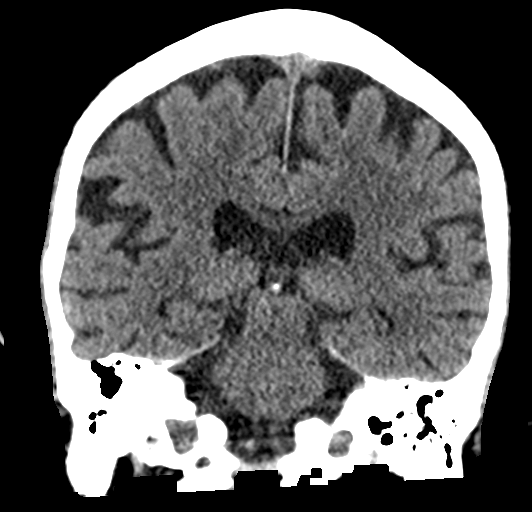

[Series 5: sagittal soft tissue · sagittal · 0.31mm/px · 3 of 52 slices shown]
[im 18/52  brain]
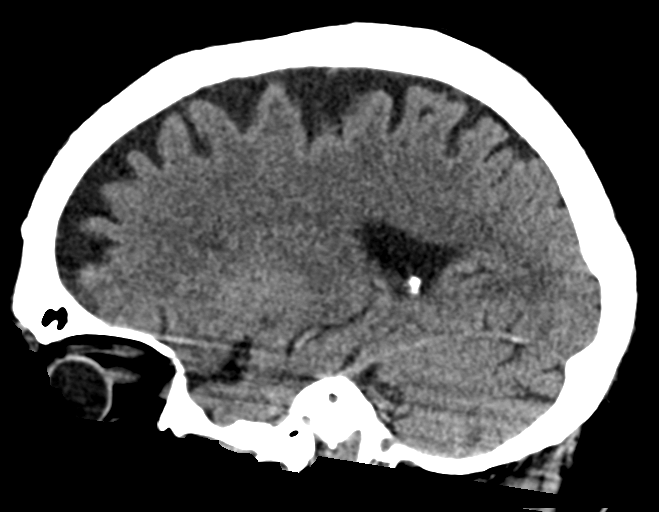
[im 26/52  brain]
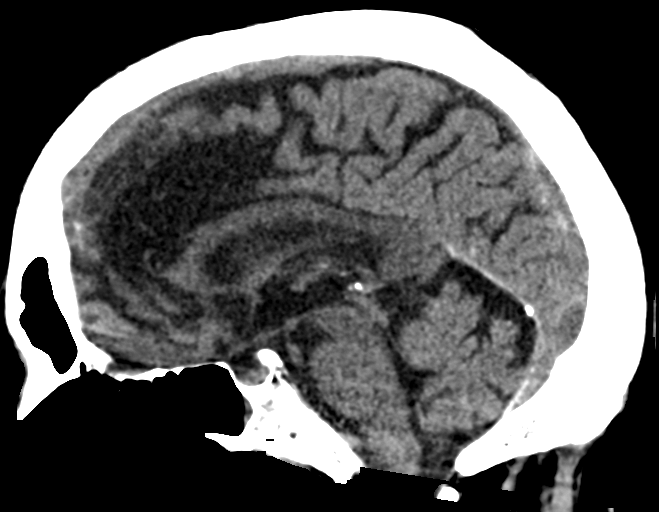
[im 35/52  brain]
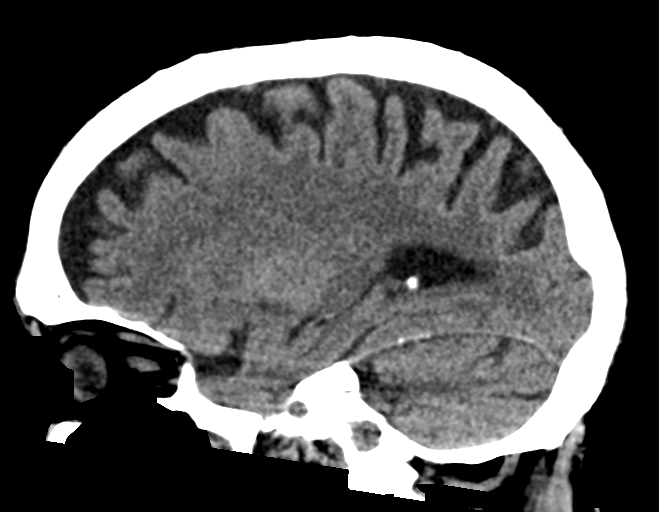

[17 of 47 positions shown; findings below may reference images not displayed]

FINDINGS: Brain: No evidence of acute infarction, hemorrhage, hydrocephalus,
extra-axial collection or mass lesion/mass effect.

Vascular: No hyperdense vessel or unexpected calcification.

Skull: Normal. Negative for fracture or focal lesion.

Sinuses/Orbits: No acute finding.

Other: None.
IMPRESSION: No acute intracranial abnormality is noted.

## 2021-02-05 IMAGING — DX DG CHEST 1V PORT
1 series · 1 of 1 positions shown · non-contrast
Comparison: [DATE]

CLINICAL DATA: Shortness of breath

EXAM:
PORTABLE CHEST 1 VIEW

[chest ap]
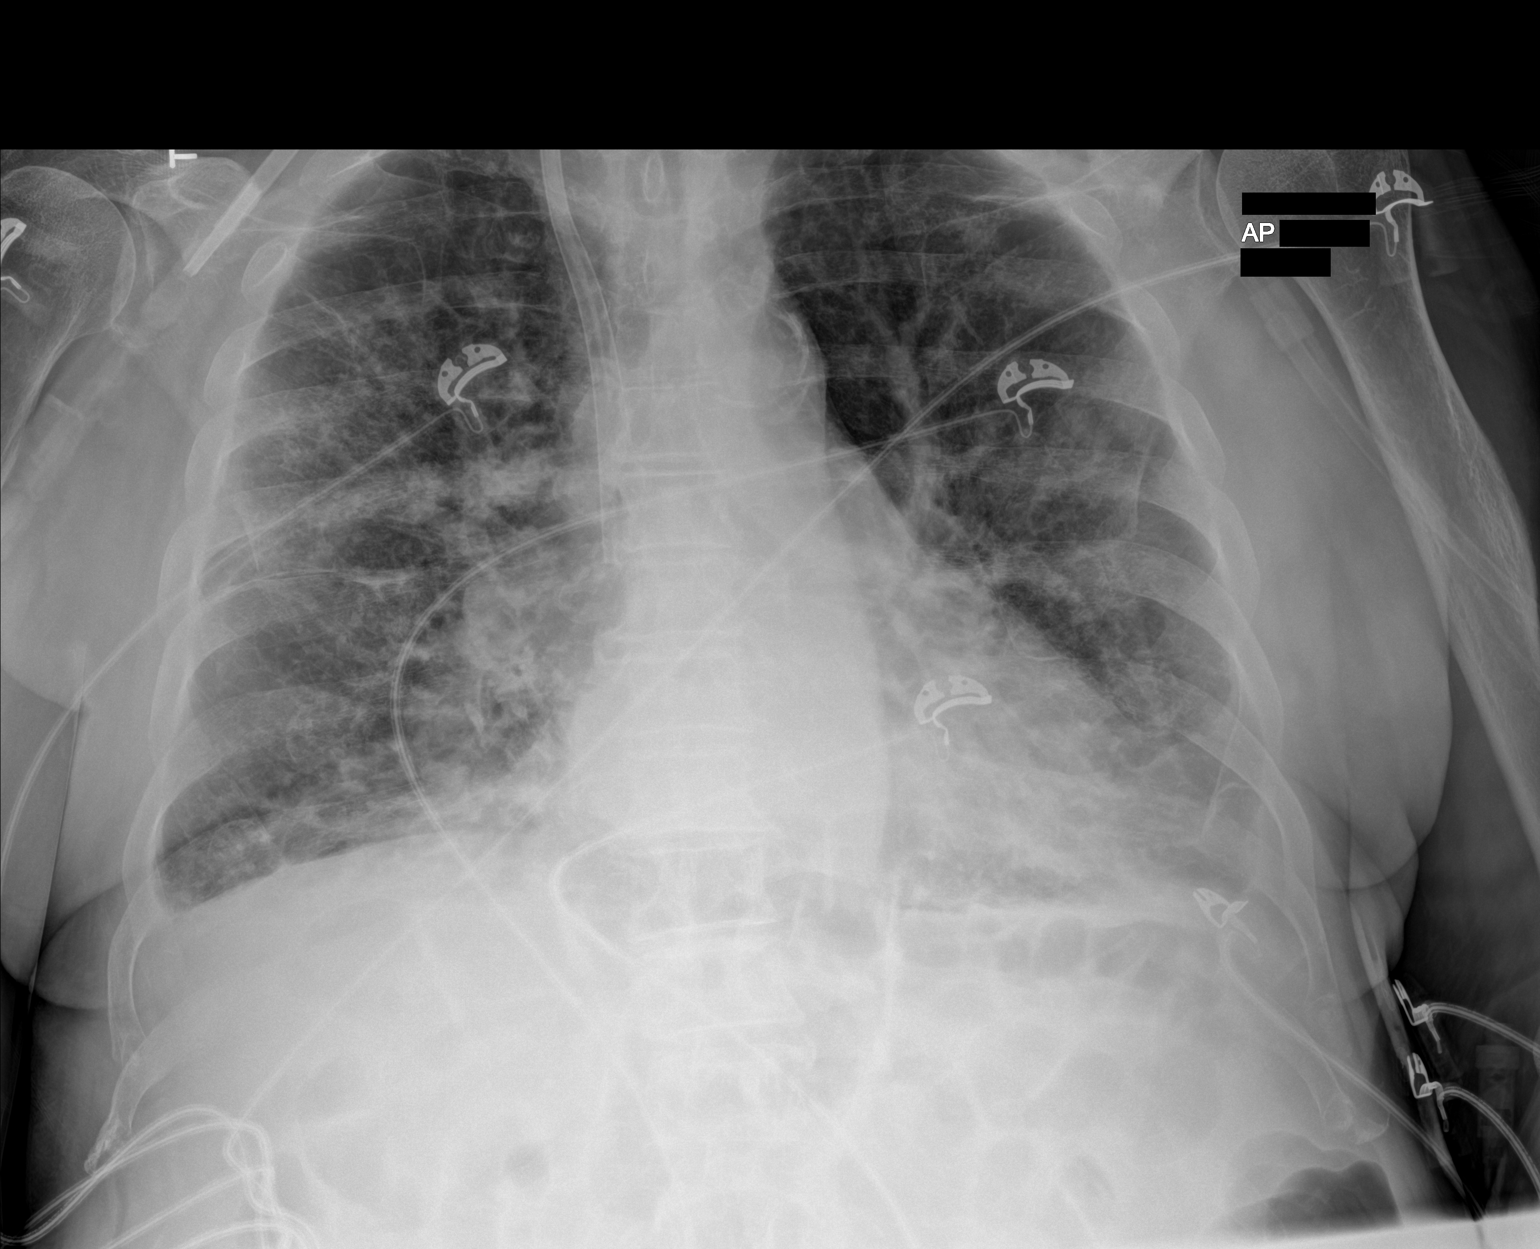

[1 of 1 positions shown; findings below may reference images not displayed]

FINDINGS: Cardiac shadow is stable. Aortic calcifications are noted. New
dialysis catheter is noted in satisfactory position. Chronic
fibrotic changes are noted bilaterally similar to that seen on the
prior study. Improved aeration in the right lung base is noted when
compare with the prior study. No bony abnormality is noted.
IMPRESSION: Chronic changes without acute abnormality.

## 2021-02-05 MED ORDER — SODIUM CHLORIDE 0.9 % IV SOLN: 1.0000 g | Freq: Two times a day (BID) | INTRAVENOUS | Status: DC

## 2021-02-05 MED ORDER — METHYLPREDNISOLONE SODIUM SUCC 125 MG IJ SOLR
125.0000 mg | Freq: Once | INTRAMUSCULAR | Status: AC
  Administered 2021-02-05: 125 mg via INTRAVENOUS
  Filled 2021-02-05: qty 2

## 2021-02-05 MED ORDER — ACETAMINOPHEN 650 MG RE SUPP: 650.0000 mg | Freq: Four times a day (QID) | RECTAL | Status: DC | PRN

## 2021-02-05 MED ORDER — SODIUM CHLORIDE 0.9 % IV SOLN
200.0000 mg | Freq: Once | INTRAVENOUS | Status: AC
  Administered 2021-02-05: 200 mg via INTRAVENOUS
  Filled 2021-02-05: qty 40

## 2021-02-05 MED ORDER — SODIUM CHLORIDE 0.9 % IV SOLN: 500.0000 mg | INTRAVENOUS | Status: DC

## 2021-02-05 MED ORDER — SODIUM CHLORIDE 0.9 % IV BOLUS (SEPSIS)
500.0000 mL | Freq: Once | INTRAVENOUS | Status: AC
  Administered 2021-02-05: 500 mL via INTRAVENOUS

## 2021-02-05 MED ORDER — CHLORHEXIDINE GLUCONATE CLOTH 2 % EX PADS
6.0000 | MEDICATED_PAD | Freq: Every day | CUTANEOUS | Status: DC
  Filled 2021-02-05: qty 6

## 2021-02-05 MED ORDER — ACETAMINOPHEN 500 MG PO TABS
1000.0000 mg | ORAL_TABLET | Freq: Once | ORAL | Status: AC
  Administered 2021-02-05: 1000 mg via ORAL
  Filled 2021-02-05: qty 2

## 2021-02-05 MED ORDER — VITAMIN D 25 MCG (1000 UNIT) PO TABS
1000.0000 [IU] | ORAL_TABLET | Freq: Every day | ORAL | Status: DC
  Administered 2021-02-05 – 2021-02-08 (×3): 1000 [IU] via ORAL
  Filled 2021-02-05 (×3): qty 1

## 2021-02-05 MED ORDER — ONDANSETRON HCL 4 MG PO TABS
4.0000 mg | ORAL_TABLET | Freq: Four times a day (QID) | ORAL | Status: DC | PRN
  Administered 2021-02-08: 16:00:00 4 mg via ORAL
  Filled 2021-02-05: qty 1

## 2021-02-05 MED ORDER — HEPARIN SODIUM (PORCINE) 5000 UNIT/ML IJ SOLN
5000.0000 [IU] | Freq: Three times a day (TID) | INTRAMUSCULAR | Status: DC
  Administered 2021-02-05 – 2021-02-07 (×5): 5000 [IU] via SUBCUTANEOUS
  Filled 2021-02-05 (×7): qty 1

## 2021-02-05 MED ORDER — POTASSIUM CHLORIDE CRYS ER 20 MEQ PO TBCR
40.0000 meq | EXTENDED_RELEASE_TABLET | Freq: Once | ORAL | Status: AC
  Administered 2021-02-05: 40 meq via ORAL
  Filled 2021-02-05: qty 2

## 2021-02-05 MED ORDER — ONDANSETRON HCL 4 MG/2ML IJ SOLN
4.0000 mg | Freq: Four times a day (QID) | INTRAMUSCULAR | Status: DC | PRN
  Administered 2021-02-07: 4 mg via INTRAVENOUS
  Filled 2021-02-05: qty 2

## 2021-02-05 MED ORDER — DRONABINOL 2.5 MG PO CAPS
5.0000 mg | ORAL_CAPSULE | Freq: Two times a day (BID) | ORAL | Status: DC
  Administered 2021-02-05 – 2021-02-09 (×5): 5 mg via ORAL
  Filled 2021-02-05 (×3): qty 2
  Filled 2021-02-05: qty 1
  Filled 2021-02-05 (×3): qty 2

## 2021-02-05 MED ORDER — OMEGA-3-ACID ETHYL ESTERS 1 G PO CAPS
3.0000 g | ORAL_CAPSULE | Freq: Two times a day (BID) | ORAL | Status: DC
  Administered 2021-02-06 – 2021-02-08 (×5): 3 g via ORAL
  Filled 2021-02-05 (×7): qty 3

## 2021-02-05 MED ORDER — IPRATROPIUM-ALBUTEROL 0.5-2.5 (3) MG/3ML IN SOLN
3.0000 mL | Freq: Once | RESPIRATORY_TRACT | Status: AC
  Administered 2021-02-05: 3 mL via RESPIRATORY_TRACT
  Filled 2021-02-05: qty 3

## 2021-02-05 MED ORDER — VANCOMYCIN HCL 1500 MG/300ML IV SOLN
1500.0000 mg | Freq: Once | INTRAVENOUS | Status: AC
  Administered 2021-02-05: 1500 mg via INTRAVENOUS
  Filled 2021-02-05: qty 300

## 2021-02-05 MED ORDER — SODIUM CHLORIDE 0.9 % IV SOLN: 100.0000 mg | Freq: Every day | INTRAVENOUS | Status: DC

## 2021-02-05 MED ORDER — SODIUM CHLORIDE 0.9 % IV SOLN
1.0000 g | Freq: Once | INTRAVENOUS | Status: AC
  Administered 2021-02-05: 1 g via INTRAVENOUS
  Filled 2021-02-05: qty 1

## 2021-02-05 MED ORDER — ACETAMINOPHEN 325 MG PO TABS
650.0000 mg | ORAL_TABLET | Freq: Four times a day (QID) | ORAL | Status: DC | PRN
  Administered 2021-02-06 – 2021-02-07 (×2): 650 mg via ORAL
  Filled 2021-02-05 (×2): qty 2

## 2021-02-05 NOTE — ED Notes (Signed)
Pt set up for breakfast.

## 2021-02-05 NOTE — ED Notes (Signed)
Pt with decreased bp, MD notified.  Pt O2 decreased to 1 L [place], will monitor O2 levels to wean pt to RA

## 2021-02-05 NOTE — Progress Notes (Signed)
CODE SEPSIS - PHARMACY COMMUNICATION  **Broad Spectrum Antibiotics should be administered within 1 hour of Sepsis diagnosis**  Time Code Sepsis Called/Page Received: 12/8 @ 0134  Antibiotics Ordered: Vanc, meropenem   Time of 1st antibiotic administration: meropenem 1 gm IV X 1 on 12/8 @ 0240   Additional action taken by pharmacy: messaged RN @ 3539 to remind him to give ABX   If necessary, Name of Provider/Nurse Contacted: Charlyne Petrin D ,PharmD Clinical Pharmacist  02/05/2021  2:41 AM

## 2021-02-05 NOTE — ED Notes (Signed)
Informed RN bed assigned 

## 2021-02-05 NOTE — ED Notes (Signed)
Per pt's request, called wife, no answer, left message.

## 2021-02-05 NOTE — Progress Notes (Signed)
Per MD Enzo Bi, covid is an old infection and pt dose not need isolation. Notified EVS to Tru-d room

## 2021-02-05 NOTE — ED Notes (Signed)
Sitting up in bed to eat lunch. Tolerating well. No signs of distress.

## 2021-02-05 NOTE — ED Notes (Signed)
Foley catheter removed per MD request

## 2021-02-05 NOTE — ED Notes (Signed)
This RN heard patient yelling "help". This RN entered room and found patient on floor, out of bed, on knees, with Ivs ripped out. IV site bleeding when this RN entered room. Patient alert when this RN entered room, stated he was trying to leave because it was cold in the room. This RN called for assistance applied pressure to IV site and bandage applied. Pt assisted to bed by this RN, dee, rn stephen, rn and vanessa, rn. Pt cleaned and assessed for injuries while charge RN alerted hospitalist. Pt adjusted in bed and instructed to call for help when getting up. Pt unsure if he hit head or not, but no injuries noted and responding appropriately to questions. No other skin injuries noted at this time by staff. New gown and blankets applied to patient. Vital signs assessed and primary RN alerted. Primary RN in emergency traffic when incident occurred.

## 2021-02-05 NOTE — ED Notes (Signed)
Pt with 12 beat run of vtach at 0725 am, this RN not in the room at the time. MD notified. Pt VS WNL at the time and at present. Pt alert and responsive. Pt on O2 2 L [place] chronically, pt sats WNL.

## 2021-02-05 NOTE — H&P (Signed)
History and Physical    Phillip Chavez AVW:[id number] DOB: 04/01/1940 DOA: 02/05/2021  PCP: [facility] [place] [facility]   Patient coming from: SNF  I have personally briefly reviewed patient's relevant medical records in [place]  Chief Complaint: shortness of breath  HPI: Phillip Chavez is a 80 y.o. male with medical history significant for HTN, CAD, COPD, ESRD recently started on HD MWF via tunneled catheter right chest, recently hospitalized from 11/5-11/26 with respiratory failure of multifactorial etiology including COVID, bacterial pneumonia and fluid overload from severe aortic regurg who was sent to the ER for evaluation of shortness of breath.  During his recent stay he was also diagnosed with sepsis secondary to Enterococcus bacteremia and is currently on Invanz.  Patient was weaned off of oxygen prior to his recent discharge.  On the day of arrival patient reports that he was having trouble catching his breath.  He denied chest pain or cough.  Patient denies nausea, vomiting, abdominal pain or diarrhea.  EMS reported a temperature of 100.4 and he arrived on 2 L oxygen  ED course: On arrival afebrile, respirations 22 with O2 sat 100% on 2 L pulse 81, BP 121/41 Blood work: Troponin 114, BNP 1779 WBC 6.6, , lactic acid 1.3, procalcitonin 0.3 Hemoglobin 7.6, down from baseline of 10.1 on 11/26 Potassium 3.0 Urinalysis with large leukocyte esterase  EKG, personally viewed and interpreted: Sinus rhythm at 77 with no acute ST-T wave changes  Imaging: Chest x-ray with chronic changes and no acute abnormality  Patient cultured and switched to vancomycin and meropenem.  Hospitalist consulted for admission.   Review of Systems: As per HPI otherwise all other systems on review of systems negative.   Assessment/Plan    Acute respiratory failure with hypoxia (HCC) -Etiology uncertain - Patient requiring 2 L O2 to maintain sats in the high 90s.  Was discharged on room air on  11/28 - BNP >1000 so possible fluid overload, symptomatic anemia - Chest x-ray with no acute disease in the setting of recent COVID and CAP - Supportive care and continue to monitor - Continue supplemental oxygen    COVID-19 virus infection - COVID-positive on 11/12 -S/p remdesivir in November 2022    Elevated troponin - Suspect demand ischemia - Troponin 114.  Last baseline was 62 - Continue to trend    Bacteremia due to Enterococcus - Continue Invanz during dialysis    Acute on chronic anemia - Hemoglobin 7.6 down from baseline of 10 - Anemia panel and stool for occult blood    COPD (chronic obstructive pulmonary disease) (HCC) - Not acutely exacerbated    Aortic valve regurgitation, severe - Last echo was on 11/5 with severe aortic regurg    ESRD on hemodialysis [facility]) - Nephrology consult for continuation of dialysis   DVT prophylaxis: Heparin  code Status: full code  Family Communication:  none  Disposition Plan: Back to previous home environment Consults called: Renal Status:At the time of admission, it appears that the appropriate admission status for this patient is INPATIENT. This is judged to be reasonable and necessary in order to provide the required intensity of service to ensure the patient's safety given the presenting symptoms, physical exam findings, and initial radiographic and laboratory data in the context of their  Comorbid conditions.   Patient requires inpatient status due to high intensity of service, high risk for further deterioration and high frequency of surveillance required.   I certify that at the point of admission it is  my clinical judgment that the patient will require inpatient hospital care spanning beyond 2 midnights     Physical Exam: Vitals:   02/05/21 0126 02/05/21 0200 02/05/21 0230 02/05/21 0341  BP:  (!) 117/39 (!) 112/41 (!) 116/35  Pulse:  81 74 71  Resp:  14 20 19   Temp: 100 F (37.8 C)     TempSrc: Rectal     SpO2:   100% 100% 97%  Weight:      Height:       Constitutional: Elderly and frail-appearing, oriented x 3 . Not in any apparent distress HEENT:      Head: Normocephalic and atraumatic.         Eyes: PERLA, EOMI, Conjunctivae are normal. Sclera is non-icteric.       Mouth/Throat: Mucous membranes are moist.       Neck: Supple with no signs of meningismus. Cardiovascular: Regular rate and rhythm. No murmurs, gallops, or rubs. 2+ symmetrical distal pulses are present . No JVD. No  LE edema.  Tunneled catheter right upper chest Respiratory: Respiratory effort increased.  Speaking in short sentences diminished.Lungs sounds clear bilaterally. No wheezes, crackles, or rhonchi.  Gastrointestinal: Soft, non tender, non distended. Positive bowel sounds.  Genitourinary: No CVA tenderness. Musculoskeletal: Nontender with normal range of motion in all extremities. No cyanosis, or erythema of extremities. Neurologic:  Face is symmetric. Moving all extremities. No gross focal neurologic deficits . Skin: Skin is warm, dry.  No rash or ulcers Psychiatric: Mood and affect are appropriate     Past Medical History:  Diagnosis Date   Chronic CHF (congestive heart failure) (HCC)    COPD (chronic obstructive pulmonary disease) (HCC)    GERD (gastroesophageal reflux disease)    HLD (hyperlipidemia)    HTN (hypertension)     Past Surgical History:  Procedure Laterality Date   dental procedure     DIALYSIS/PERMA CATHETER INSERTION N/A 01/20/2021   Procedure: DIALYSIS/PERMA CATHETER INSERTION;  Surgeon: Algernon Huxley, MD;  Location: [facility];  Service: Cardiovascular;  Laterality: N/A;   TEE WITHOUT CARDIOVERSION N/A 01/06/2021   Procedure: TRANSESOPHAGEAL ECHOCARDIOGRAM (TEE);  Surgeon: Minna Merritts, MD;  Location: [facility];  Service: Cardiovascular;  Laterality: N/A;     reports that he quit smoking about 42 years ago. His smoking use included cigarettes. He has a 20.00 pack-year smoking  history. He has never used smokeless tobacco. He reports that he does not currently use alcohol. He reports that he does not use drugs.  Allergies  Allergen Reactions   Aggrenox [Aspirin-Dipyridamole Er] Nausea And Vomiting   Atorvastatin Other (See Comments)    Muscle pain   Azithromycin    Cefpodoxime Other (See Comments)    headache   Ciprofloxacin    Claritin [Loratadine] Other (See Comments)    Urinary retention   Contrast Media [Iodinated Diagnostic Agents] Hives   Doxazosin Other (See Comments)    Elevated blood pressure   Doxycycline    Equal [Aspartame] Diarrhea   Fenofibrate Other (See Comments)    Muscle pain   Flomax [Tamsulosin] Other (See Comments)    Elevated blood pressure   Flunisolide Other (See Comments)    Burning sensation   Fluticasone-Salmeterol     Throat irriation   Fluvastatin Other (See Comments)    Muscle pain   Gemfibrozil Other (See Comments)    Muscle pain   Haemophilus Influenzae Vaccines Other (See Comments)    Chest pain   Hydrochlorothiazide Other (See Comments)  weakness   Levofloxacin Other (See Comments)    Nervousness, aggitation   Lisinopril Other (See Comments)    Chest pain   Moxifloxacin Other (See Comments)    Altered behavior   Niacin And Related Other (See Comments)    flushing   Oxycodone    Plavix [Clopidogrel] Other (See Comments)    Constriction in throat   Pravastatin Other (See Comments)    Elevated blood pressure   Simvastatin Other (See Comments)    Muscle pain   Terazosin Other (See Comments)    Fatigue    Zetia [Ezetimibe] Other (See Comments)    Edema    Amoxicillin Palpitations    Chest pain   Mometasone Palpitations    Elevated blood pressure   Sulfa Antibiotics Rash and Hypertension    Family History  Problem Relation Age of Onset   Prostate cancer Father    Prostate cancer Brother    Pancreatic cancer Brother       Prior to Admission medications   Medication Sig Start Date End Date  Taking? Authorizing Provider  albuterol (VENTOLIN HFA) 108 (90 Base) MCG/ACT inhaler Inhale 2 puffs into the lungs every 6 (six) hours as needed for wheezing or shortness of breath.   Yes [provider]  bisacodyl (DULCOLAX) 10 MG suppository Place 10 mg rectally as needed for moderate constipation.   Yes [provider]  calamine lotion Apply 1 application topically as needed for itching.   Yes [provider]  cholecalciferol (VITAMIN D3) 25 MCG (1000 UNIT) tablet Take 1,000 Units by mouth daily. 03/08/13  Yes [provider]  dronabinol (MARINOL) 5 MG capsule Take 5 mg by mouth 2 (two) times daily before a meal.   Yes [provider]  ferrous sulfate 325 (65 FE) MG tablet Take 1 tablet (325 mg total) by mouth daily with breakfast. 01/24/21  Yes Enzo Bi, MD  ketotifen (ZADITOR) 0.025 % ophthalmic solution 1 drop 2 (two) times daily.   Yes [provider]  metoCLOPramide (REGLAN) 5 MG tablet Take 5 mg by mouth 4 (four) times daily -  before meals and at bedtime.   Yes [provider]  multivitamin (RENA-VIT) TABS tablet Take 1 tablet by mouth at bedtime. 01/24/21  Yes Enzo Bi, MD  Nutritional Supplements (FEEDING SUPPLEMENT, NEPRO CARB STEADY,) LIQD Take 237 mLs by mouth 3 (three) times daily between meals. 01/24/21  Yes Enzo Bi, MD  nystatin powder Apply 1 application topically 2 (two) times daily.   Yes [provider]  omega-3 acid ethyl esters (LOVAZA) 1 g capsule Take 3 g by mouth 2 (two) times daily.   Yes [provider]  pantoprazole (PROTONIX) 40 MG tablet 1 tablet daily. 04/18/20  Yes [provider]  polyethylene glycol (MIRALAX / GLYCOLAX) 17 g packet Take 17 g by mouth 2 (two) times daily. 01/24/21  Yes Enzo Bi, MD  rosuvastatin (CRESTOR) 40 MG tablet Take 40 mg by mouth daily.   Yes [provider]  Omega-3 Fatty Acids (FISH OIL) 1000 MG CAPS TAKE 3 CAPSULES (SEA-OMEGA 30 FISH OIL)  BY MOUTH TWO TIMES A DAY FOR CHOLESTEROL 01/08/20   [provider]  pseudoephedrine-acetaminophen (TYLENOL SINUS) 30-500 MG TABS tablet Take 1 tablet by mouth every 4 (four) hours as needed. Patient not taking: Reported on 02/05/2021    [provider]      Labs on Admission: I have personally reviewed following labs and imaging studies  CBC: Recent Labs  Lab  02/05/21 0130  WBC 6.6  NEUTROABS 4.0  HGB 7.6*  HCT 23.8*  MCV 89.8  PLT 748*   Basic Metabolic Panel: Recent Labs  Lab 02/05/21 0130  NA 135  K 3.0*  CL 98  CO2 27  GLUCOSE 90  BUN 12  CREATININE 3.13*  CALCIUM 7.6*   GFR: Estimated Creatinine Clearance: 15.8 mL/min (A) (by C-G formula based on SCr of 3.13 mg/dL (H)). Liver Function Tests: Recent Labs  Lab 02/05/21 0130  AST 66*  ALT 112*  ALKPHOS 94  BILITOT 0.7  PROT 6.1*  ALBUMIN 2.0*   No results for input(s): LIPASE, AMYLASE in the last 168 hours. No results for input(s): AMMONIA in the last 168 hours. Coagulation Profile: No results for input(s): INR, PROTIME in the last 168 hours. Cardiac Enzymes: No results for input(s): CKTOTAL, CKMB, CKMBINDEX, TROPONINI in the last 168 hours. BNP (last 3 results) No results for input(s): PROBNP in the last 8760 hours. HbA1C: No results for input(s): HGBA1C in the last 72 hours. CBG: No results for input(s): GLUCAP in the last 168 hours. Lipid Profile: No results for input(s): CHOL, HDL, LDLCALC, TRIG, CHOLHDL, LDLDIRECT in the last 72 hours. Thyroid Function Tests: No results for input(s): TSH, T4TOTAL, FREET4, T3FREE, THYROIDAB in the last 72 hours. Anemia Panel: No results for input(s): VITAMINB12, FOLATE, FERRITIN, TIBC, IRON, RETICCTPCT in the last 72 hours. Urine analysis:    Component Value Date/Time   COLORURINE YELLOW 02/05/2021 0130   APPEARANCEUR TURBID (A) 02/05/2021 0130   LABSPEC 1.020 02/05/2021 0130   PHURINE 7.0 02/05/2021 0130   GLUCOSEU NEGATIVE 02/05/2021 0130    HGBUR LARGE (A) 02/05/2021 0130   BILIRUBINUR SMALL (A) 02/05/2021 0130   KETONESUR TRACE (A) 02/05/2021 0130   PROTEINUR >300 (A) 02/05/2021 0130   NITRITE NEGATIVE 02/05/2021 0130   LEUKOCYTESUR LARGE (A) 02/05/2021 0130    Radiological Exams on Admission: DG Chest Portable 1 View  Result Date: 02/05/2021 CLINICAL DATA:  Shortness of breath EXAM: PORTABLE CHEST 1 VIEW COMPARISON:  01/10/2021 FINDINGS: Cardiac shadow is stable. Aortic calcifications are noted. New dialysis catheter is noted in satisfactory position. Chronic fibrotic changes are noted bilaterally similar to that seen on the prior study. Improved aeration in the right lung base is noted when compare with the prior study. No bony abnormality is noted. IMPRESSION: Chronic changes without acute abnormality. Electronically Signed   By: Inez Catalina M.D.   On: 02/05/2021 01:27       Athena Masse MD [facility]   02/05/2021, 3:57 AM

## 2021-02-05 NOTE — ED Notes (Signed)
Pt moved to hospital bed, bedding and blankets changed. Bed alarm set. Pt reminded to not get OOB by himself. Call bell in reach. Pt states understanding. This RN updated pt's wife.

## 2021-02-05 NOTE — ED Notes (Signed)
Resting with eyes closed. Easily aroused. BP ranging from 10-62 systolic with map greater than 50. 12 beat run of ventricular tachycardia noted on monitor. Patient asymptomatic. MD informed. Skin w/d/I. No complaints.

## 2021-02-05 NOTE — Progress Notes (Signed)
PROGRESS NOTE    [name]  ENI:[id number] DOB: Dec 17, 1940 DOA: 02/05/2021 [facility]CP: [facility]  EDOTFA/EDOTF   Assessment & Plan:   Principal Problem:   Acute respiratory failure with hypoxia ([place]) Active Problems:   COPD (chronic obstructive pulmonary disease) (HCC)   Elevated troponin   Aortic valve regurgitation   COVID-19 virus infection   Bacteremia due to Enterococcus   Acute on chronic anemia   ESRD on hemodialysis (HCC)   Phillip Chavez is a 80 y.o. male with medical history significant for HTN, CAD, COPD, ESRD recently started on HD MWF via tunneled catheter right chest, recently hospitalized from 11/5-11/26 with respiratory failure of multifactorial etiology including COVID, bacterial pneumonia and fluid overload from severe aortic regurg who presented from SNF to the ER for evaluation of shortness of breath.   During his recent stay he was also diagnosed with sepsis secondary to Enterococcus bacteremia and had completed abx course prior to discharge.  Patient was weaned off of oxygen prior to his recent discharge.  On the day of arrival patient reports that he was having trouble catching his breath when he woke up in the middle of the night.  EMS reported a temperature of 100.4 and he arrived on 2 L oxygen     Acute respiratory failure with hypoxia, ruled out --no hypoxia documented.  Pt reported waking up feeling like he couldn't catch his breath, likely an episode of panic or sleep apnea.   --In the ED, pt was noted to desat while sleeping, so likely has sleep apnea, but while awake, was able to maintain 98% on room air.   --CXR showed no acute finding, and actually improved aeration in the right lung base is noted when compare with the prior study.  Both procal and BNP improved from prior hospitalization as well.  Reported fever --temp 100.4 noted per EMS, no fever documented since presentation.  No leukocytosis, procal improved from prior. --received  vanc/meropenem on presentation. --Hold all abx and monitor for fever --f/u blood cx  Urinary retention with Foley on presentation --Per SNF, pt had no urine output for about a week (pt is dialysis pt), and complained of abdominal pain, with 1L urine return when Foley inserted.  Urine cx sent, but not yet resulted, and SNF had ordered Ertapenem IM for empiric tx for UTI, but pt refused.  Abdominal pain resolved after bladder decompression, and pt denied dysuria. --UA and urine cx obtained in the ED after presentation, likely from the existing Foley, so likely to be contaminated.   Plan: --hold abx for now --cont foley for now and monitor urine volume  Recent COVID-19 virus infection, not currently active - COVID-positive on 11/12 -S/p remdesivir in November 2022 --no need for repeat tx     Elevated troponin - Suspect demand ischemia - Troponin 114, and trended down to 95   Recent Bacteremia due to Enterococcus - already completed treatment prior to last discharge.  The Invanz ordered at the facility was for empiric tx of UTI, and pt reportedly declined.     Acute on chronic anemia - Hemoglobin 7.6 down from baseline of 10 - Anemia panel      COPD (chronic obstructive pulmonary disease) (HCC) - Not acutely exacerbated     Aortic valve regurgitation, severe - Last echo was on 11/5 with severe aortic regurg     ESRD on hemodialysis [facility]) - Nephrology consult for continuation of dialysis   DVT prophylaxis: Heparin SQ Code Status:  Full code  Family Communication: wife updated at bedside today  Level of care: Med-Surg Dispo:   The patient is from: SNF Anticipated d/c is to: SNF Anticipated d/c date is: likely tomorrow Patient currently is not medically ready to d/c due to: monitor for fever off of abx   Subjective and Interval History:  Pt denied dyspnea, denied dysuria.  Still not wanting to eat.     Objective: Vitals:   02/05/21 1730 02/05/21 1745 02/05/21 1800  02/05/21 1954  BP:   (!) 109/43   Pulse: 86 85 86   Resp: 18 (!) 30 (!) 25   Temp:    98.6 F (37 C)  TempSrc:    Oral  SpO2: 96% 97% 98%   Weight:      Height:        Intake/Output Summary (Last 24 hours) at 02/05/2021 2020 Last data filed at 02/05/2021 5284 Gross per 24 hour  Intake 900 ml  Output --  Net 900 ml   Filed Weights   02/05/21 0111  Weight: 63.5 kg    Examination:   Constitutional: NAD, AAOx3 HEENT: conjunctivae and lids normal, EOMI CV: No cyanosis.   RESP: normal respiratory effort, 98% on RA Extremities: No effusions, edema in BLE SKIN: warm, dry Neuro: II - XII grossly intact.     Data Reviewed: I have personally reviewed following labs and imaging studies  CBC: Recent Labs  Lab 02/05/21 0130  WBC 6.6  NEUTROABS 4.0  HGB 7.6*  HCT 23.8*  MCV 89.8  PLT 132*   Basic Metabolic Panel: Recent Labs  Lab 02/05/21 0130 02/05/21 0342  NA 135  --   K 3.0*  --   CL 98  --   CO2 27  --   GLUCOSE 90  --   BUN 12  --   CREATININE 3.13*  --   CALCIUM 7.6*  --   MG  --  1.6*   GFR: Estimated Creatinine Clearance: 15.8 mL/min (A) (by C-G formula based on SCr of 3.13 mg/dL (H)). Liver Function Tests: Recent Labs  Lab 02/05/21 0130  AST 66*  ALT 112*  ALKPHOS 94  BILITOT 0.7  PROT 6.1*  ALBUMIN 2.0*   No results for input(s): LIPASE, AMYLASE in the last 168 hours. No results for input(s): AMMONIA in the last 168 hours. Coagulation Profile: No results for input(s): INR, PROTIME in the last 168 hours. Cardiac Enzymes: No results for input(s): CKTOTAL, CKMB, CKMBINDEX, TROPONINI in the last 168 hours. BNP (last 3 results) No results for input(s): PROBNP in the last 8760 hours. HbA1C: No results for input(s): HGBA1C in the last 72 hours. CBG: No results for input(s): GLUCAP in the last 168 hours. Lipid Profile: No results for input(s): CHOL, HDL, LDLCALC, TRIG, CHOLHDL, LDLDIRECT in the last 72 hours. Thyroid Function Tests: No  results for input(s): TSH, T4TOTAL, FREET4, T3FREE, THYROIDAB in the last 72 hours. Anemia Panel: Recent Labs    02/05/21 0854  VITAMINB12 521  FOLATE 20.0  FERRITIN 488*  TIBC 189*  IRON 14*  RETICCTPCT 1.5   Sepsis Labs: Recent Labs  Lab 02/05/21 0130  PROCALCITON 0.32  LATICACIDVEN 1.3    Recent Results (from the past 240 hour(s))  Culture, blood (Routine X 2) w Reflex to ID Panel     Status: None (Preliminary result)   Collection Time: 02/05/21  1:30 AM   Specimen: BLOOD  Result Value Ref Range Status   Specimen Description BLOOD LEFT ASSIST CONTROL  Final   Special Requests   Final    BOTTLES DRAWN AEROBIC AND ANAEROBIC Blood Culture adequate volume   Culture   Final    NO GROWTH < 12 HOURS Performed at [facility] [address], [address] [place], [place] [address]    Report Status PENDING  Incomplete  Culture, blood (Routine X 2) w Reflex to ID Panel     Status: None (Preliminary result)   Collection Time: 02/05/21  1:30 AM   Specimen: BLOOD  Result Value Ref Range Status   Specimen Description BLOOD RIGHT ASSIST CONTROL  Final   Special Requests   Final    BOTTLES DRAWN AEROBIC AND ANAEROBIC Blood Culture adequate volume   Culture   Final    NO GROWTH < 12 HOURS Performed at [facility], [address] [place], [place] [address]    Report Status PENDING  Incomplete  Resp Panel by RT-PCR (Flu A&B, Covid) Urine, Catheterized     Status: Abnormal   Collection Time: 02/05/21  1:30 AM   Specimen: Urine, Catheterized; Nasopharyngeal(NP) swabs in vial transport medium  Result Value Ref Range Status   SARS Coronavirus 2 by RT PCR POSITIVE (A) NEGATIVE Final    Comment: RESULT CALLED TO, READ BACK BY AND VERIFIED WITH: AUSTIN REEVES@[id number]  02/05/21 RH (NOTE) SARS-CoV-2 target nucleic acids are DETECTED.  The SARS-CoV-2 RNA is generally detectable in upper respiratory specimens during the acute phase of infection. Positive results are indicative of  the presence of the identified virus, but do not rule out bacterial infection or co-infection with other pathogens not detected by the test. Clinical correlation with patient history and other diagnostic information is necessary to determine patient infection status. The expected result is Negative.  Fact Sheet for Patients: EntrepreneurPulse.com.au  Fact Sheet for Healthcare Providers: IncredibleEmployment.be  This test is not yet approved or cleared by the [place] FDA and  has been authorized for detection and/or diagnosis of SARS-CoV-2 by FDA under an Emergency Use Authorization (EUA).  This EUA will remain in effect (meaning this test can be use d) for the duration of  the COVID-19 declaration under Section 564(b)(1) of the Act, 21 U.S.C. section 360bbb-3(b)(1), unless the authorization is terminated or revoked sooner.     Influenza A by PCR NEGATIVE NEGATIVE Final   Influenza B by PCR NEGATIVE NEGATIVE Final    Comment: (NOTE) The Xpert Xpress SARS-CoV-2/FLU/RSV plus assay is intended as an aid in the diagnosis of influenza from Nasopharyngeal swab specimens and should not be used as a sole basis for treatment. Nasal washings and aspirates are unacceptable for Xpert Xpress SARS-CoV-2/FLU/RSV testing.  Fact Sheet for Patients: EntrepreneurPulse.com.au  Fact Sheet for Healthcare Providers: IncredibleEmployment.be  This test is not yet approved or cleared by the [place] FDA and has been authorized for detection and/or diagnosis of SARS-CoV-2 by FDA under an Emergency Use Authorization (EUA). This EUA will remain in effect (meaning this test can be used) for the duration of the COVID-19 declaration under Section 564(b)(1) of the Act, 21 U.S.C. section 360bbb-3(b)(1), unless the authorization is terminated or revoked.  Performed at [facility], [address] [place], [place]  [address]       Radiology Studies: CT HEAD WO CONTRAST (5MM)  Result Date: 02/05/2021 CLINICAL DATA:  Unwitnessed fall. EXAM: CT HEAD WITHOUT CONTRAST TECHNIQUE: Contiguous axial images were obtained from the base of the skull through the vertex without intravenous contrast. COMPARISON:  None. FINDINGS: Brain: No evidence of acute  infarction, hemorrhage, hydrocephalus, extra-axial collection or mass lesion/mass effect. Vascular: No hyperdense vessel or unexpected calcification. Skull: Normal. Negative for fracture or focal lesion. Sinuses/Orbits: No acute finding. Other: None. IMPRESSION: No acute intracranial abnormality is noted. Electronically Signed   By: Marijo Conception M.D.   On: 02/05/2021 08:08   DG Chest Portable 1 View  Result Date: 02/05/2021 CLINICAL DATA:  Shortness of breath EXAM: PORTABLE CHEST 1 VIEW COMPARISON:  01/10/2021 FINDINGS: Cardiac shadow is stable. Aortic calcifications are noted. New dialysis catheter is noted in satisfactory position. Chronic fibrotic changes are noted bilaterally similar to that seen on the prior study. Improved aeration in the right lung base is noted when compare with the prior study. No bony abnormality is noted. IMPRESSION: Chronic changes without acute abnormality. Electronically Signed   By: Inez Catalina M.D.   On: 02/05/2021 01:27     Scheduled Meds:  [START ON 02/06/2021] Chlorhexidine Gluconate Cloth  6 each Topical Q0600   cholecalciferol  1,000 Units Oral Daily   dronabinol  5 mg Oral BID AC   heparin  5,000 Units Subcutaneous Q8H   omega-3 acid ethyl esters  3 g Oral BID   Continuous Infusions:   LOS: 0 days    Enzo Bi, MD [facility] If 7PM-7AM, please contact night-coverage 02/05/2021, 8:20 PM

## 2021-02-05 NOTE — Progress Notes (Signed)
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  Patient reports to the emergency department with complaints of shortness of breath.  He is admitted for COVID-19 virus infection [U07.1]  Patient is known to our clinic from past hospitalization and was recently started on hemodialysis during previous admission. Patient currently receives outpatient treatments at [facility] on a MWF schedule. He currently resides at a skilled nursing facility.  Patient states he had trouble catching his breath and was placed on 2 L oxygen.  Denies nausea, vomiting, and diarrhea.  Low-grade temp of 100.4 on arrival.  Denies cough.  Denies chest pain or other discomfort.  States he has not missed any dialysis treatments.  Labs on arrival significant for potassium 3.0, creatinine 3.13 with GFR 19, magnesium 1.6, and albumin 2.0 with elevated liver enzymes.  CT head normal for acute changes.  Chest x-ray negative.  Patient also found to be COVID-19 positive.  We have been consulted to provide dialysis needs during this admission   Objective:  Vital signs in last 24 hours:  Temp:  [98.8 F (37.1 C)-100 F (37.8 C)] 100 F (37.8 C) (12/08 0126) Pulse Rate:  [66-81] 80 (12/08 1300) Resp:  [14-23] 23 (12/08 1300) BP: (86-137)/(33-43) 108/38 (12/08 1300) SpO2:  [95 %-100 %] 95 % (12/08 1300) Weight:  [63.5 kg] 63.5 kg (12/08 0111)  Weight change:  Filed Weights   02/05/21 0111  Weight: 63.5 kg    Intake/Output: I/O last 3 completed shifts: In: 900 [IV Piggyback:900] Out: -    Intake/Output this shift:  No intake/output data recorded.  Physical Exam: General: NAD, resting on stretcher  Head: Normocephalic, atraumatic. Moist oral mucosal membranes  Eyes: Anicteric  Lungs:  Diminished in bases, O2 2 L  Heart: Regular rate and  rhythm  Abdomen:  Soft, nontender  Extremities: No peripheral edema.  Neurologic: Nonfocal, moving all four extremities  Skin: No lesions  Access: Right IJ PermCath    Basic Metabolic Panel: Recent Labs  Lab 02/05/21 0130 02/05/21 0342  NA 135  --   K 3.0*  --   CL 98  --   CO2 27  --   GLUCOSE 90  --   BUN 12  --   CREATININE 3.13*  --   CALCIUM 7.6*  --   MG  --  1.6*    Liver Function Tests: Recent Labs  Lab 02/05/21 0130  AST 66*  ALT 112*  ALKPHOS 94  BILITOT 0.7  PROT 6.1*  ALBUMIN 2.0*   No results for input(s): LIPASE, AMYLASE in the last 168 hours. No results for input(s): AMMONIA in the last 168 hours.  CBC: Recent Labs  Lab 02/05/21 0130  WBC 6.6  NEUTROABS 4.0  HGB 7.6*  HCT 23.8*  MCV 89.8  PLT 145*    Cardiac Enzymes: No results for input(s): CKTOTAL, CKMB, CKMBINDEX, TROPONINI in the last 168 hours.  BNP: Invalid input(s): POCBNP  CBG: No results for input(s): GLUCAP in the last 168 hours.  Microbiology: Results for orders placed or performed during the hospital encounter of 02/05/21  Culture, blood (Routine X 2) w Reflex to ID Panel     Status: None (Preliminary result)   Collection Time: 02/05/21  1:30 AM   Specimen: BLOOD  Result Value Ref Range Status   Specimen Description BLOOD LEFT ASSIST  CONTROL  Final   Special Requests   Final    BOTTLES DRAWN AEROBIC AND ANAEROBIC Blood Culture adequate volume   Culture   Final    NO GROWTH < 12 HOURS Performed at [facility], [address] [place], [place] [address]    Report Status PENDING  Incomplete  Culture, blood (Routine X 2) w Reflex to ID Panel     Status: None (Preliminary result)   Collection Time: 02/05/21  1:30 AM   Specimen: BLOOD  Result Value Ref Range Status   Specimen Description BLOOD RIGHT ASSIST CONTROL  Final   Special Requests   Final    BOTTLES DRAWN AEROBIC AND ANAEROBIC Blood Culture adequate volume   Culture   Final    NO GROWTH < 12  HOURS Performed at [facility], [address] [place], [place] [address]    Report Status PENDING  Incomplete  Resp Panel by RT-PCR (Flu A&B, Covid) Urine, Catheterized     Status: Abnormal   Collection Time: 02/05/21  1:30 AM   Specimen: Urine, Catheterized; Nasopharyngeal(NP) swabs in vial transport medium  Result Value Ref Range Status   SARS Coronavirus 2 by RT PCR POSITIVE (A) NEGATIVE Final    Comment: RESULT CALLED TO, READ BACK BY AND VERIFIED WITH: AUSTIN REEVES@[id number]  02/05/21 RH (NOTE) SARS-CoV-2 target nucleic acids are DETECTED.  The SARS-CoV-2 RNA is generally detectable in upper respiratory specimens during the acute phase of infection. Positive results are indicative of the presence of the identified virus, but do not rule out bacterial infection or co-infection with other pathogens not detected by the test. Clinical correlation with patient history and other diagnostic information is necessary to determine patient infection status. The expected result is Negative.  Fact Sheet for Patients: EntrepreneurPulse.com.au  Fact Sheet for Healthcare Providers: IncredibleEmployment.be  This test is not yet approved or cleared by the [place] FDA and  has been authorized for detection and/or diagnosis of SARS-CoV-2 by FDA under an Emergency Use Authorization (EUA).  This EUA will remain in effect (meaning this test can be use d) for the duration of  the COVID-19 declaration under Section 564(b)(1) of the Act, 21 U.S.C. section 360bbb-3(b)(1), unless the authorization is terminated or revoked sooner.     Influenza A by PCR NEGATIVE NEGATIVE Final   Influenza B by PCR NEGATIVE NEGATIVE Final    Comment: (NOTE) The Xpert Xpress SARS-CoV-2/FLU/RSV plus assay is intended as an aid in the diagnosis of influenza from Nasopharyngeal swab specimens and should not be used as a sole basis for treatment. Nasal washings  and aspirates are unacceptable for Xpert Xpress SARS-CoV-2/FLU/RSV testing.  Fact Sheet for Patients: EntrepreneurPulse.com.au  Fact Sheet for Healthcare Providers: IncredibleEmployment.be  This test is not yet approved or cleared by the [place] FDA and has been authorized for detection and/or diagnosis of SARS-CoV-2 by FDA under an Emergency Use Authorization (EUA). This EUA will remain in effect (meaning this test can be used) for the duration of the COVID-19 declaration under Section 564(b)(1) of the Act, 21 U.S.C. section 360bbb-3(b)(1), unless the authorization is terminated or revoked.  Performed at [facility], [address] [place], [place] [address]     Coagulation Studies: No results for input(s): LABPROT, INR in the last 72 hours.  Urinalysis: Recent Labs    02/05/21 0130  COLORURINE YELLOW  LABSPEC 1.020  PHURINE 7.0  GLUCOSEU NEGATIVE  HGBUR LARGE*  BILIRUBINUR SMALL*  KETONESUR TRACE*  PROTEINUR >300*  NITRITE  NEGATIVE  LEUKOCYTESUR LARGE*      Imaging: CT HEAD WO CONTRAST (5MM)  Result Date: 02/05/2021 CLINICAL DATA:  Unwitnessed fall. EXAM: CT HEAD WITHOUT CONTRAST TECHNIQUE: Contiguous axial images were obtained from the base of the skull through the vertex without intravenous contrast. COMPARISON:  None. FINDINGS: Brain: No evidence of acute infarction, hemorrhage, hydrocephalus, extra-axial collection or mass lesion/mass effect. Vascular: No hyperdense vessel or unexpected calcification. Skull: Normal. Negative for fracture or focal lesion. Sinuses/Orbits: No acute finding. Other: None. IMPRESSION: No acute intracranial abnormality is noted. Electronically Signed   By: Marijo Conception M.D.   On: 02/05/2021 08:08   DG Chest Portable 1 View  Result Date: 02/05/2021 CLINICAL DATA:  Shortness of breath EXAM: PORTABLE CHEST 1 VIEW COMPARISON:  01/10/2021 FINDINGS: Cardiac shadow is stable. Aortic  calcifications are noted. New dialysis catheter is noted in satisfactory position. Chronic fibrotic changes are noted bilaterally similar to that seen on the prior study. Improved aeration in the right lung base is noted when compare with the prior study. No bony abnormality is noted. IMPRESSION: Chronic changes without acute abnormality. Electronically Signed   By: Inez Catalina M.D.   On: 02/05/2021 01:27     Medications:    [START ON 02/06/2021] meropenem (MERREM) IV      cholecalciferol  1,000 Units Oral Daily   dronabinol  5 mg Oral BID AC   heparin  5,000 Units Subcutaneous Q8H   omega-3 acid ethyl esters  3 g Oral BID   acetaminophen **OR** acetaminophen, ondansetron **OR** ondansetron (ZOFRAN) IV  Assessment/ Plan:  [name] is a 80 y.o.  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  Patient reports to the emergency department with complaints of shortness of breath.  He is admitted for COVID-19 virus infection [U07.1]  [facility]/MWF/ Rt Permcath  End-stage renal disease with hypokalemia on dialysis.  We will plan for dialysis tomorrow to maintain outpatient schedule.  Potassium supplementation as needed.  If remains decreased, will correct with dialysis tomorrow.  2. Anemia of chronic kidney disease Lab Results  Component Value Date   HGB 7.6 (L) 02/05/2021  Hemoglobin below target.  Will order EPO with dialysis treatment  3. Secondary Hyperparathyroidism:  Lab Results  Component Value Date   CALCIUM 7.6 (L) 02/05/2021   PHOS 7.2 (H) 01/12/2021    Will continue to monitor bone minerals during this admission.   4. Covid 19 positive Remdesivir ordered  Supportive care    LOS: 0 Jelisa  12/8/20222:12 PM

## 2021-02-05 NOTE — Progress Notes (Addendum)
Pharmacy Antibiotic Note  Phillip Chavez is a 80 y.o. male admitted on 02/05/2021 with sepsis, enterococcus bacteremia. Pt was recently started on HD.  Pharmacy has been consulted for meropenem dosing.  Pt was on Invanz outpt for enterococcus bacteremia,  will transition to meropenem while at [facility].   Plan: Meropenem 1 gm IV X 1 given in ED on 12/8 @ 0240. Meropenem 500 mg IV Q24H ordered to continue on 12/9 @ 300.   Height: 5\' 5"  (165.1 cm) Weight: 63.5 kg (140 lb) IBW/kg (Calculated) : 61.5  Temp (24hrs), Avg:99.4 F (37.4 C), Min:98.8 F (37.1 C), Max:100 F (37.8 C)  Recent Labs  Lab 02/05/21 0130  WBC 6.6  CREATININE 3.13*  LATICACIDVEN 1.3    Estimated Creatinine Clearance: 15.8 mL/min (A) (by C-G formula based on SCr of 3.13 mg/dL (H)).    Allergies  Allergen Reactions   Aggrenox [Aspirin-Dipyridamole Er] Nausea And Vomiting   Atorvastatin Other (See Comments)    Muscle pain   Azithromycin    Cefpodoxime Other (See Comments)    headache   Ciprofloxacin    Claritin [Loratadine] Other (See Comments)    Urinary retention   Contrast Media [Iodinated Diagnostic Agents] Hives   Doxazosin Other (See Comments)    Elevated blood pressure   Doxycycline    Equal [Aspartame] Diarrhea   Fenofibrate Other (See Comments)    Muscle pain   Flomax [Tamsulosin] Other (See Comments)    Elevated blood pressure   Flunisolide Other (See Comments)    Burning sensation   Fluticasone-Salmeterol     Throat irriation   Fluvastatin Other (See Comments)    Muscle pain   Gemfibrozil Other (See Comments)    Muscle pain   Haemophilus Influenzae Vaccines Other (See Comments)    Chest pain   Hydrochlorothiazide Other (See Comments)    weakness   Levofloxacin Other (See Comments)    Nervousness, aggitation   Lisinopril Other (See Comments)    Chest pain   Moxifloxacin Other (See Comments)    Altered behavior   Niacin And Related Other (See Comments)    flushing   Oxycodone     Plavix [Clopidogrel] Other (See Comments)    Constriction in throat   Pravastatin Other (See Comments)    Elevated blood pressure   Simvastatin Other (See Comments)    Muscle pain   Terazosin Other (See Comments)    Fatigue    Zetia [Ezetimibe] Other (See Comments)    Edema    Amoxicillin Palpitations    Chest pain   Mometasone Palpitations    Elevated blood pressure   Sulfa Antibiotics Rash and Hypertension    Antimicrobials this admission:   >>    >>   Dose adjustments this admission:   Microbiology results:  BCx:   UCx:    Sputum:    MRSA PCR:   Thank you for allowing pharmacy to be a part of this patient's care.  Wilna Pennie D 02/05/2021 5:05 AM

## 2021-02-05 NOTE — ED Provider Notes (Addendum)
[facility] Emergency Department Provider Note  ____________________________________________   Event Date/Time   First MD Initiated Contact with Patient 02/05/21 0100     (approximate)  I have reviewed the triage vital signs and the nursing notes.   HISTORY  Chief Complaint Shortness of Breath    HPI Phillip Chavez is a 80 y.o. 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  He wears 2 L of oxygen chronically.  EMS reports that they did not have to increase his oxygen requirement.  He denies any fevers, chest pain, vomiting, diarrhea, abdominal pain.  Has a Foley catheter in place and was recently started on ertapenem for a UTI on 02/04/2021.  Patient was recently admitted to the hospital at the beginning of November 2022 for community-acquired pneumonia and had blood cultures positive for Enterococcus.  He has a tunneled catheter in his right chest wall where he receives dialysis.  There is bruising around this area but he denies any pain.  Denies missing any dialysis sessions.  EMS found patient to have a temperature of 100.4.  Has had 4 COVID-19 vaccinations.  Unable to get the flu vaccine.        Past Medical History:  Diagnosis Date   Chronic CHF (congestive heart failure) (HCC)    COPD (chronic obstructive pulmonary disease) (HCC)    GERD (gastroesophageal reflux disease)    HLD (hyperlipidemia)    HTN (hypertension)     Patient Active Problem List   Diagnosis Date Noted   COVID-19 virus infection 01/14/2021   Aortic valve regurgitation    CAP (community acquired pneumonia) 01/03/2021   Sepsis ([place]) 01/03/2021   COPD (chronic obstructive pulmonary disease) ([place]) 01/03/2021   GERD (gastroesophageal reflux disease) 01/03/2021   HTN (hypertension)     HLD (hyperlipidemia)    Acute respiratory failure with hypoxia (HCC)    Elevated troponin    AKI (acute kidney injury) ([place])    Normocytic anemia     Past Surgical History:  Procedure Laterality Date   dental procedure     DIALYSIS/PERMA CATHETER INSERTION N/A 01/20/2021   Procedure: DIALYSIS/PERMA CATHETER INSERTION;  Surgeon: Algernon Huxley, MD;  Location: [place] [facility];  Service: Cardiovascular;  Laterality: N/A;   TEE WITHOUT CARDIOVERSION N/A 01/06/2021   Procedure: TRANSESOPHAGEAL ECHOCARDIOGRAM (TEE);  Surgeon: Minna Merritts, MD;  Location: [facility];  Service: Cardiovascular;  Laterality: N/A;    Prior to Admission medications   Medication Sig Start Date End Date Taking? Authorizing Provider  albuterol (VENTOLIN HFA) 108 (90 Base) MCG/ACT inhaler Inhale 2 puffs into the lungs every 6 (six) hours as needed for wheezing or shortness of breath.   Yes [provider]  bisacodyl (DULCOLAX) 10 MG suppository Place 10 mg rectally as needed for moderate constipation.   Yes [provider]  calamine lotion Apply 1 application topically as needed for itching.   Yes [provider]  cholecalciferol (VITAMIN D3) 25 MCG (1000 UNIT) tablet Take 1,000 Units by mouth daily. 03/08/13  Yes [provider]  dronabinol (MARINOL) 5 MG capsule Take 5 mg by mouth 2 (two) times daily before a meal.   Yes [provider]  ferrous sulfate 325 (65 FE) MG tablet Take 1 tablet (325 mg total) by mouth daily with breakfast. 01/24/21  Yes Enzo Bi, MD  ketotifen (ZADITOR) 0.025 % ophthalmic solution 1 drop 2 (two) times daily.   Yes [provider]  metoCLOPramide (REGLAN) 5 MG tablet Take 5 mg by mouth 4 (four) times daily -  before meals and at bedtime.   Yes [provider]  multivitamin (RENA-VIT) TABS tablet Take 1 tablet by mouth at bedtime. 01/24/21  Yes Enzo Bi, MD  Nutritional Supplements (FEEDING SUPPLEMENT, NEPRO CARB STEADY,) LIQD  Take 237 mLs by mouth 3 (three) times daily between meals. 01/24/21  Yes Enzo Bi, MD  nystatin powder Apply 1 application topically 2 (two) times daily.   Yes [provider]  omega-3 acid ethyl esters (LOVAZA) 1 g capsule Take 3 g by mouth 2 (two) times daily.   Yes [provider]  pantoprazole (PROTONIX) 40 MG tablet 1 tablet daily. 04/18/20  Yes [provider]  polyethylene glycol (MIRALAX / GLYCOLAX) 17 g packet Take 17 g by mouth 2 (two) times daily. 01/24/21  Yes Enzo Bi, MD  rosuvastatin (CRESTOR) 40 MG tablet Take 40 mg by mouth daily.   Yes [provider]  Omega-3 Fatty Acids (FISH OIL) 1000 MG CAPS TAKE 3 CAPSULES (SEA-OMEGA 30 FISH OIL) BY MOUTH TWO TIMES A DAY FOR CHOLESTEROL 01/08/20   [provider]  pseudoephedrine-acetaminophen (TYLENOL SINUS) 30-500 MG TABS tablet Take 1 tablet by mouth every 4 (four) hours as needed. Patient not taking: Reported on 02/05/2021    [provider]    Allergies Aggrenox [aspirin-dipyridamole er], Atorvastatin, Azithromycin, Cefpodoxime, Ciprofloxacin, Claritin [loratadine], Contrast media [iodinated diagnostic agents], Doxazosin, Doxycycline, Equal [aspartame], Fenofibrate, Flomax [tamsulosin], Flunisolide, Fluticasone-salmeterol, Fluvastatin, Gemfibrozil, Haemophilus influenzae vaccines, Hydrochlorothiazide, Levofloxacin, Lisinopril, Moxifloxacin, Niacin and related, Oxycodone, Plavix [clopidogrel], Pravastatin, Simvastatin, Terazosin, Zetia [ezetimibe], Amoxicillin, Mometasone, and Sulfa antibiotics  Family History  Problem Relation Age of Onset   Prostate cancer Father    Prostate cancer Brother    Pancreatic cancer Brother     Social History Social History   Tobacco Use   Smoking status: Former    Packs/day: 1.00    Years: 20.00    Pack years: 20.00    Types: Cigarettes    Quit date: 03/21/1978    Years since quitting: 42.9   Smokeless tobacco: Never  Substance Use Topics    Alcohol use: Not Currently   Drug use: Never    Review of Systems Constitutional: + fever. Eyes: No visual changes. ENT: No sore throat. Cardiovascular: Denies chest pain. Respiratory: + shortness of breath. Gastrointestinal: No nausea, vomiting, diarrhea. Genitourinary: Negative for dysuria. Musculoskeletal: Negative for back pain. Skin: Negative for rash. Neurological: Negative for focal weakness or numbness.  ____________________________________________   PHYSICAL EXAM:  VITAL SIGNS: ED Triage Vitals  Enc Vitals Group     BP 02/05/21 0110 (!) 121/41     Pulse Rate 02/05/21 0110 81     Resp 02/05/21 0110 (!) 22     Temp 02/05/21 0110 98.8 F (37.1 C)     Temp Source 02/05/21 0110 Oral     SpO2 02/05/21 0110 100 %     Weight 02/05/21 0111 140 lb (63.5 kg)     Height 02/05/21 0111 5\' 5"  (1.651 m)     Head Circumference --      Peak Flow --      Pain Score 02/05/21 0111 0     Pain Loc --      Pain Edu? --      Excl. in [place]? --    CONSTITUTIONAL: Alert and  oriented and responds appropriately to questions.  Elderly, chronically ill-appearing HEAD: Normocephalic EYES: Conjunctivae clear, pupils appear equal, EOM appear intact ENT: normal nose; moist mucous membranes NECK: Supple, normal ROM CARD: RRR; S1 and S2 appreciated; no murmurs, no clicks, no rubs, no gallops CHEST: Patient has ecchymosis noted around a tunneled catheter on the right side of his chest with no bleeding, redness, warmth, induration, crepitus, fluctuance. RESP: Speaking full sentences but patient is tachypneic.  He has expiratory wheezing and rales heard bilaterally with diminished aeration at his bases.  No rhonchi appreciated.  No respiratory distress. ABD/GI: Normal bowel sounds; non-distended; soft, non-tender, no rebound, no guarding, no peritoneal signs, no hepatosplenomegaly BACK: The back appears normal EXT: Normal ROM in all joints; no deformity noted, no edema; no cyanosis SKIN: Normal  color for age and race; warm; no rash on exposed skin NEURO: Moves all extremities equally PSYCH: The patient's mood and manner are appropriate.  ____________________________________________   LABS (all labs ordered are listed, but only abnormal results are displayed)  Labs Reviewed  RESP PANEL BY RT-PCR (FLU A&B, COVID) ARPGX2 - Abnormal; Notable for the following components:      Result Value   SARS Coronavirus 2 by RT PCR POSITIVE (*)    All other components within normal limits  CBC WITH DIFFERENTIAL/PLATELET - Abnormal; Notable for the following components:   RBC 2.65 (*)    Hemoglobin 7.6 (*)    HCT 23.8 (*)    RDW 17.0 (*)    Platelets 145 (*)    All other components within normal limits  BRAIN NATRIURETIC PEPTIDE - Abnormal; Notable for the following components:   B Natriuretic Peptide 1,779.2 (*)    All other components within normal limits  COMPREHENSIVE METABOLIC PANEL - Abnormal; Notable for the following components:   Potassium 3.0 (*)    Creatinine, Ser 3.13 (*)    Calcium 7.6 (*)    Total Protein 6.1 (*)    Albumin 2.0 (*)    AST 66 (*)    ALT 112 (*)    GFR, Estimated 19 (*)    All other components within normal limits  URINALYSIS, COMPLETE (UACMP) WITH MICROSCOPIC - Abnormal; Notable for the following components:   APPearance TURBID (*)    Hgb urine dipstick LARGE (*)    Bilirubin Urine SMALL (*)    Ketones, ur TRACE (*)    Protein, ur >300 (*)    Leukocytes,Ua LARGE (*)    Non Squamous Epithelial PRESENT (*)    All other components within normal limits  TROPONIN I (HIGH SENSITIVITY) - Abnormal; Notable for the following components:   Troponin I (High Sensitivity) 114 (*)    All other components within normal limits  URINE CULTURE  CULTURE, BLOOD (ROUTINE X 2)  CULTURE, BLOOD (ROUTINE X 2)  PROCALCITONIN  LACTIC ACID, PLASMA  TROPONIN I (HIGH SENSITIVITY)   ____________________________________________  EKG   EKG  Interpretation  Date/Time:  Thursday February 05 2021 01:16:10 EST Ventricular Rate:  77 PR Interval:  175 QRS Duration: 89 QT Interval:  447 QTC Calculation: 506 R Axis:   46 Text Interpretation: Sinus rhythm Anteroseptal infarct, old Minimal ST depression, lateral leads Prolonged QT interval Confirmed by Pryor Curia [phone]) on 02/05/2021 2:21:01 AM        ____________________________________________  RADIOLOGY Jessie Foot Beyonca Wisz, personally viewed and evaluated these images (plain radiographs) as part of my medical decision making, as well as reviewing the written report by the radiologist.  ED MD  interpretation: Chest x-ray shows no acute abnormality.  Official radiology report(s): DG Chest Portable 1 View  Result Date: 02/05/2021 CLINICAL DATA:  Shortness of breath EXAM: PORTABLE CHEST 1 VIEW COMPARISON:  01/10/2021 FINDINGS: Cardiac shadow is stable. Aortic calcifications are noted. New dialysis catheter is noted in satisfactory position. Chronic fibrotic changes are noted bilaterally similar to that seen on the prior study. Improved aeration in the right lung base is noted when compare with the prior study. No bony abnormality is noted. IMPRESSION: Chronic changes without acute abnormality. Electronically Signed   By: Inez Catalina M.D.   On: 02/05/2021 01:27    ____________________________________________   PROCEDURES  Procedure(s) performed (including Critical Care):  Procedures  CRITICAL CARE Performed by: Cyril Mourning Indy Prestwood   Total critical care time: 45 minutes  Critical care time was exclusive of separately billable procedures and treating other patients.  Critical care was necessary to treat or prevent imminent or life-threatening deterioration.  Critical care was time spent personally by me on the following activities: development of treatment plan with patient and/or surrogate as well as nursing, discussions with consultants, evaluation of patient's response to  treatment, examination of patient, obtaining history from patient or surrogate, ordering and performing treatments and interventions, ordering and review of laboratory studies, ordering and review of radiographic studies, pulse oximetry and re-evaluation of patient's condition.  ____________________________________________   INITIAL IMPRESSION / ASSESSMENT AND PLAN / ED COURSE  As part of my medical decision making, I reviewed the following data within the [place] notes reviewed and incorporated, Labs reviewed , EKG interpreted , Old EKG reviewed, Old chart reviewed, Radiograph reviewed , Discussed with admitting physician , and Notes from prior ED visits         Patient here with fever, shortness of breath.  He was febrile with a temperature of 100.4 orally with EMS and tachypneic.  Patient meets SIRS criteria.  Also has a slightly low diastolic blood pressure with maps in the low 60s.  We will continue to monitor very closely.  Discussed with pharmacist who recommends meropenem and vancomycin for broad coverage for possible pneumonia, bacteremia as well as EBSL.  I do not have a recent urine culture for him but he did grow Enterococcus on a blood culture at the beginning of November.  Will obtain septic work-up with labs, cultures, urine, chest x-ray, COVID and flu swab.  Patient will likely need admission.  Differential includes pneumonia, COVID, flu, viral URI, COPD exacerbation, UTI, pyelonephritis, bacteremia.  Will give breathing treatments for symptomatic relief.  ED PROGRESS  Patient reports feeling better after breathing treatments but is still slightly tachypneic but again no increase in his chronic oxygen requirement of 2 L.  His chest x-ray was reviewed by myself and radiology and shows no acute abnormality.  He is positive for COVID-19.  Influenza negative.  We will start remdesivir and steroids.  Patient also still appears to have a urinary tract  infection.  He is receiving IV antibiotics.  His MAP has dropped to 58.  We will start gentle hydration given he is on dialysis and I am concerned that he could also be partially volume overloaded and do not want to over hydrate him also with his diagnosis of COVID 19.  Lactic is normal.  Procalcitonin minimally elevated.  Patient also appears to be anemic which is worse than previous.  Hemoglobin 7.6.  Denies any active bleeding.  This may be from his chronic kidney disease.  I do  not feel he needs emergent transfusion but will need this closely monitored.  Patient also has a prolonged QT interval on EKG which is new compared to previous.  Potassium level normal.  Will check magnesium level.  Patient also has a slightly elevated troponin.  He denies any active chest pain at this time.  This is likely due to increased metabolic demand from sepsis also in the setting of chronic kidney disease.  Discussed patient's case with hospitalist, Dr. Damita Dunnings.  I have recommended admission and patient (and family if present) agree with this plan. Admitting physician will place admission orders.   I reviewed all nursing notes, vitals, pertinent previous records and reviewed/interpreted all EKGs, lab and urine results, imaging (as available).  ____________________________________________   FINAL CLINICAL IMPRESSION(S) / ED DIAGNOSES  Final diagnoses:  Acute UTI  Shortness of breath  COPD exacerbation ([place])  COVID-19     ED Discharge Orders     None       *Please note:  Phillip Chavez was evaluated in Emergency Department on 02/05/2021 for the symptoms described in the history of present illness. He was evaluated in the context of the global COVID-19 pandemic, which necessitated consideration that the patient might be at risk for infection with the SARS-CoV-2 virus that causes COVID-19. Institutional protocols and algorithms that pertain to the evaluation of patients at risk for COVID-19 are in a  state of rapid change based on information released by regulatory bodies including the CDC and federal and state organizations. These policies and algorithms were followed during the patient's care in the ED.  Some ED evaluations and interventions may be delayed as a result of limited staffing during and the pandemic.*   Note:  This document was prepared using Dragon voice recognition software and may include unintentional dictation errors.    Maryella Abood, Delice Bison, DO 02/05/21 0330    Hanaa Payes, Delice Bison, DO 02/05/21 [phone]

## 2021-02-05 NOTE — ED Notes (Signed)
This RN was walking by ED RM 19 and saw multiple staff members in the patient's room. Pt was on the floor on his knees, bleeding, blankets and cords in pile nearby and foley catheter bag disconnected from tubing.  Staff members present were Francoise Schaumann, Gregor Hams, Karena Addison RN and Diplomatic Services operational officer. Pt was assisted back to bed, I contacted Sharion Settler NP to notify her of the fall.  Pt's primary RN was in another room with a critical pt. Pt was placed back in bed and cleaned up. New standard drainage bag attached to foley catheter.

## 2021-02-05 NOTE — ED Notes (Signed)
Lab called to collect labs  

## 2021-02-05 NOTE — Sepsis Progress Note (Signed)
Monitoring for the code sepsis protocol. °

## 2021-02-05 NOTE — ED Notes (Signed)
Sitting up in bed eating dinner. Wife at bedside. No complaints. No signs of distress.

## 2021-02-05 NOTE — ED Triage Notes (Signed)
Pt presents to ER via ems from [facility].  EMS called out d/t SOB.  Pt states he has been unable to take a deep breath at home.  Pt wears 2L O2 at home.  Ems reports pts VS HR 85, temp 100.4.  Pt A&O to baseline at this time.

## 2021-02-05 NOTE — Progress Notes (Signed)
Remdesivir - Pharmacy Brief Note   O:  ALT: 112  CXR:  SpO2: 97 % on RA   A/P:  Remdesivir 200 mg IVPB once followed by 100 mg IVPB daily x 4 days.   Harmani Neto D 02/05/2021 4:08 AM

## 2021-02-05 NOTE — ED Notes (Signed)
Assisted to toilet to have BM.

## 2021-02-05 NOTE — ED Notes (Signed)
Pt foley draining only small amount of urine. MD notified.

## 2021-02-05 NOTE — Progress Notes (Signed)
PHARMACY -  BRIEF ANTIBIOTIC NOTE   Pharmacy has received consult(s) for Vancomycin, Meropenem from an ED provider.  The patient's profile has been reviewed for ht/wt/allergies/indication/available labs.    One time order(s) placed for Vancomycin 1500 mg IV X 1 and meropenem 1 gm IV X 1   Further antibiotics/pharmacy consults should be ordered by admitting physician if indicated.                       Thank you, Clois Treanor D 02/05/2021  1:56 AM

## 2021-02-06 DIAGNOSIS — L8990 Pressure ulcer of unspecified site, unspecified stage: Secondary | ICD-10-CM | POA: Insufficient documentation

## 2021-02-06 LAB — CBC
HCT: 23.6 % — ABNORMAL LOW (ref 39.0–52.0)
Hemoglobin: 7.6 g/dL — ABNORMAL LOW (ref 13.0–17.0)
MCH: 29.1 pg (ref 26.0–34.0)
MCHC: 32.2 g/dL (ref 30.0–36.0)
MCV: 90.4 fL (ref 80.0–100.0)
Platelets: 151 10*3/uL (ref 150–400)
RBC: 2.61 MIL/uL — ABNORMAL LOW (ref 4.22–5.81)
RDW: 17.2 % — ABNORMAL HIGH (ref 11.5–15.5)
WBC: 8 10*3/uL (ref 4.0–10.5)
nRBC: 0 % (ref 0.0–0.2)

## 2021-02-06 LAB — MAGNESIUM: Magnesium: 1.9 mg/dL (ref 1.7–2.4)

## 2021-02-06 LAB — BASIC METABOLIC PANEL
Anion gap: 9 (ref 5–15)
BUN: 39 mg/dL — ABNORMAL HIGH (ref 8–23)
CO2: 26 mmol/L (ref 22–32)
Calcium: 8 mg/dL — ABNORMAL LOW (ref 8.9–10.3)
Chloride: 101 mmol/L (ref 98–111)
Creatinine, Ser: 5.01 mg/dL — ABNORMAL HIGH (ref 0.61–1.24)
GFR, Estimated: 11 mL/min — ABNORMAL LOW (ref >60)
Glucose, Bld: 152 mg/dL — ABNORMAL HIGH (ref 70–99)
Potassium: 4.8 mmol/L (ref 3.5–5.1)
Sodium: 136 mmol/L (ref 135–145)

## 2021-02-06 LAB — HEPATITIS B SURFACE ANTIBODY,QUALITATIVE: Hep B S Ab: NONREACTIVE

## 2021-02-06 LAB — HEPATITIS B SURFACE ANTIGEN: Hepatitis B Surface Ag: NONREACTIVE

## 2021-02-06 MED ORDER — EPOETIN ALFA 10000 UNIT/ML IJ SOLN
10000.0000 [IU] | INTRAMUSCULAR | Status: DC
  Administered 2021-02-09: 10000 [IU] via INTRAVENOUS
  Filled 2021-02-06 (×2): qty 1

## 2021-02-06 MED ORDER — SODIUM CHLORIDE 0.9 % IV SOLN
500.0000 mg | Freq: Once | INTRAVENOUS | Status: AC
  Administered 2021-02-06: 500 mg via INTRAVENOUS
  Filled 2021-02-06: qty 0.5

## 2021-02-06 MED ORDER — MUPIROCIN 2 % EX OINT
TOPICAL_OINTMENT | Freq: Two times a day (BID) | CUTANEOUS | Status: DC
  Filled 2021-02-06: qty 22

## 2021-02-06 MED ORDER — ALPRAZOLAM 0.25 MG PO TABS
0.2500 mg | ORAL_TABLET | Freq: Once | ORAL | Status: AC
  Administered 2021-02-06: 0.25 mg via ORAL
  Filled 2021-02-06: qty 1

## 2021-02-06 MED ORDER — CHLORHEXIDINE GLUCONATE CLOTH 2 % EX PADS
6.0000 | MEDICATED_PAD | Freq: Every day | CUTANEOUS | Status: DC
  Administered 2021-02-06 – 2021-02-09 (×4): 6 via TOPICAL

## 2021-02-06 MED ORDER — PROSOURCE PLUS PO LIQD
30.0000 mL | Freq: Three times a day (TID) | ORAL | Status: DC
  Administered 2021-02-06 – 2021-02-08 (×5): 30 mL via ORAL
  Filled 2021-02-06 (×10): qty 30

## 2021-02-06 MED ORDER — NEPRO/CARBSTEADY PO LIQD
237.0000 mL | Freq: Two times a day (BID) | ORAL | Status: DC
  Administered 2021-02-07 – 2021-02-08 (×4): 237 mL via ORAL

## 2021-02-06 MED ORDER — HEPARIN SODIUM (PORCINE) 1000 UNIT/ML IJ SOLN
INTRAMUSCULAR | Status: AC
  Filled 2021-02-06: qty 10

## 2021-02-06 MED ORDER — RENA-VITE PO TABS
1.0000 | ORAL_TABLET | Freq: Every day | ORAL | Status: DC
  Administered 2021-02-06 – 2021-02-08 (×3): 1 via ORAL
  Filled 2021-02-06 (×4): qty 1

## 2021-02-06 NOTE — Progress Notes (Signed)
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  Patient reports to the emergency department with complaints of shortness of breath.  He is admitted for Shortness of breath [R06.02] COPD exacerbation ([place]) [J44.1] Acute UTI [N39.0] Anemia, unspecified type [D64.9] COVID-19 virus infection [U07.1] COVID-19 [U07.1]  Patient is known to our clinic from past hospitalization and was recently started on hemodialysis during previous admission. Patient currently receives outpatient treatments at [facility] on a MWF schedule.   Patient seen laying in bed. Alert and oriented Wife at bedside Denies shortness of breath, currently on 2L [place]  [facility] later today    Objective:  Vital signs in last 24 hours:  Temp:  [97.5 F (36.4 C)-98.7 F (37.1 C)] 97.5 F (36.4 C) (12/09 0753) Pulse Rate:  [66-87] 71 (12/09 0753) Resp:  [16-30] 18 (12/09 0753) BP: (86-109)/(33-45) 109/44 (12/09 0753) SpO2:  [91 %-100 %] 100 % (12/09 0753)  Weight change:  Filed Weights   02/05/21 0111  Weight: 63.5 kg    Intake/Output: I/O last 3 completed shifts: In: 900 [IV Piggyback:900] Out: -    Intake/Output this shift:  No intake/output data recorded.  Physical Exam: General: NAD, resting in bed  Head: Normocephalic, atraumatic. Moist oral mucosal membranes  Eyes: Anicteric  Lungs:  Clear bilaterally, O2 2 L  Heart: Regular rate and rhythm  Abdomen:  Soft, nontender  Extremities: No peripheral edema.  Neurologic: Nonfocal, moving all four extremities  Skin: No lesions  Access: Right IJ PermCath    Basic Metabolic Panel: Recent Labs  Lab 02/05/21 0130 02/05/21 0342 02/06/21 0455  NA 135  --  136  K 3.0*  --  4.8  CL 98  --  101  CO2 27  --  26  GLUCOSE 90  --  152*  BUN 12  --  39*  CREATININE 3.13*  --  5.01*   CALCIUM 7.6*  --  8.0*  MG  --  1.6* 1.9     Liver Function Tests: Recent Labs  Lab 02/05/21 0130  AST 66*  ALT 112*  ALKPHOS 94  BILITOT 0.7  PROT 6.1*  ALBUMIN 2.0*    No results for input(s): LIPASE, AMYLASE in the last 168 hours. No results for input(s): AMMONIA in the last 168 hours.  CBC: Recent Labs  Lab 02/05/21 0130 02/06/21 0455  WBC 6.6 8.0  NEUTROABS 4.0  --   HGB 7.6* 7.6*  HCT 23.8* 23.6*  MCV 89.8 90.4  PLT 145* 151     Cardiac Enzymes: No results for input(s): CKTOTAL, CKMB, CKMBINDEX, TROPONINI in the last 168 hours.  BNP: Invalid input(s): POCBNP  CBG: No results for input(s): GLUCAP in the last 168 hours.  Microbiology: Results for orders placed or performed during the hospital encounter of 02/05/21  Urine Culture     Status: Abnormal (Preliminary result)   Collection Time: 02/05/21  1:30 AM   Specimen: Urine, Random  Result Value Ref Range Status   Specimen Description   Final    URINE, RANDOM Performed at [facility], [address] [place], [place] [address]    Special Requests   Final    NONE Performed at [facility], [address] [place],  [address]    Culture (A)  Final    >=100,000 COLONIES/mL GRAM NEGATIVE RODS SUSCEPTIBILITIES TO  FOLLOW Performed at [facility], [address] [place], [place] [address]    Report Status PENDING  Incomplete  Culture, blood (Routine X 2) w Reflex to ID Panel     Status: None (Preliminary result)   Collection Time: 02/05/21  1:30 AM   Specimen: BLOOD  Result Value Ref Range Status   Specimen Description BLOOD LEFT ASSIST CONTROL  Final   Special Requests   Final    BOTTLES DRAWN AEROBIC AND ANAEROBIC Blood Culture adequate volume   Culture   Final    NO GROWTH 1 DAY Performed at [facility], [address] [place], [place] [address]    Report Status PENDING  Incomplete  Culture, blood (Routine X 2) w Reflex to ID Panel     Status:  None (Preliminary result)   Collection Time: 02/05/21  1:30 AM   Specimen: BLOOD  Result Value Ref Range Status   Specimen Description BLOOD RIGHT ASSIST CONTROL  Final   Special Requests   Final    BOTTLES DRAWN AEROBIC AND ANAEROBIC Blood Culture adequate volume   Culture   Final    NO GROWTH 1 DAY Performed at [facility], [address] [place], [place] [address]    Report Status PENDING  Incomplete  Resp Panel by RT-PCR (Flu A&B, Covid) Urine, Catheterized     Status: Abnormal   Collection Time: 02/05/21  1:30 AM   Specimen: Urine, Catheterized; Nasopharyngeal(NP) swabs in vial transport medium  Result Value Ref Range Status   SARS Coronavirus 2 by RT PCR POSITIVE (A) NEGATIVE Final    Comment: RESULT CALLED TO, READ BACK BY AND VERIFIED WITH: AUSTIN REEVES@[id number]  02/05/21 RH (NOTE) SARS-CoV-2 target nucleic acids are DETECTED.  The SARS-CoV-2 RNA is generally detectable in upper respiratory specimens during the acute phase of infection. Positive results are indicative of the presence of the identified virus, but do not rule out bacterial infection or co-infection with other pathogens not detected by the test. Clinical correlation with patient history and other diagnostic information is necessary to determine patient infection status. The expected result is Negative.  Fact Sheet for Patients: EntrepreneurPulse.com.au  Fact Sheet for Healthcare Providers: IncredibleEmployment.be  This test is not yet approved or cleared by the [place] FDA and  has been authorized for detection and/or diagnosis of SARS-CoV-2 by FDA under an Emergency Use Authorization (EUA).  This EUA will remain in effect (meaning this test can be use d) for the duration of  the COVID-19 declaration under Section 564(b)(1) of the Act, 21 U.S.C. section 360bbb-3(b)(1), unless the authorization is terminated or revoked sooner.     Influenza A by PCR  NEGATIVE NEGATIVE Final   Influenza B by PCR NEGATIVE NEGATIVE Final    Comment: (NOTE) The Xpert Xpress SARS-CoV-2/FLU/RSV plus assay is intended as an aid in the diagnosis of influenza from Nasopharyngeal swab specimens and should not be used as a sole basis for treatment. Nasal washings and aspirates are unacceptable for Xpert Xpress SARS-CoV-2/FLU/RSV testing.  Fact Sheet for Patients: EntrepreneurPulse.com.au  Fact Sheet for Healthcare Providers: IncredibleEmployment.be  This test is not yet approved or cleared by the [place] FDA and has been authorized for detection and/or diagnosis of SARS-CoV-2 by FDA under an Emergency Use Authorization (EUA). This EUA will remain in effect (meaning this test can be used) for the duration of the COVID-19 declaration under Section 564(b)(1) of the Act, 21 U.S.C. section 360bbb-3(b)(1), unless the authorization is terminated or revoked.  Performed at [facility], [address] [place], [place] [address]     Coagulation Studies: No results for input(s): LABPROT, INR in the last 72 hours.  Urinalysis: Recent Labs    02/05/21 0130  COLORURINE YELLOW  LABSPEC 1.020  PHURINE 7.0  GLUCOSEU NEGATIVE  HGBUR LARGE*  BILIRUBINUR SMALL*  KETONESUR TRACE*  PROTEINUR >300*  NITRITE NEGATIVE  LEUKOCYTESUR LARGE*       Imaging: CT HEAD WO CONTRAST (5MM)  Result Date: 02/05/2021 CLINICAL DATA:  Unwitnessed fall. EXAM: CT HEAD WITHOUT CONTRAST TECHNIQUE: Contiguous axial images were obtained from the base of the skull through the vertex without intravenous contrast. COMPARISON:  None. FINDINGS: Brain: No evidence of acute infarction, hemorrhage, hydrocephalus, extra-axial collection or mass lesion/mass effect. Vascular: No hyperdense vessel or unexpected calcification. Skull: Normal. Negative for fracture or focal lesion. Sinuses/Orbits: No acute finding. Other: None. IMPRESSION: No acute  intracranial abnormality is noted. Electronically Signed   By: Marijo Conception M.D.   On: 02/05/2021 08:08   DG Chest Portable 1 View  Result Date: 02/05/2021 CLINICAL DATA:  Shortness of breath EXAM: PORTABLE CHEST 1 VIEW COMPARISON:  01/10/2021 FINDINGS: Cardiac shadow is stable. Aortic calcifications are noted. New dialysis catheter is noted in satisfactory position. Chronic fibrotic changes are noted bilaterally similar to that seen on the prior study. Improved aeration in the right lung base is noted when compare with the prior study. No bony abnormality is noted. IMPRESSION: Chronic changes without acute abnormality. Electronically Signed   By: Inez Catalina M.D.   On: 02/05/2021 01:27     Medications:      Chlorhexidine Gluconate Cloth  6 each Topical Daily   cholecalciferol  1,000 Units Oral Daily   dronabinol  5 mg Oral BID AC   heparin  5,000 Units Subcutaneous Q8H   mupirocin ointment   Topical BID   omega-3 acid ethyl esters  3 g Oral BID   acetaminophen **OR** acetaminophen, ondansetron **OR** ondansetron (ZOFRAN) IV  Assessment/ Plan:  [name] is a 80 y.o.  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  Patient reports to the emergency department with complaints of shortness of breath.  He is admitted for Shortness of breath [R06.02] COPD exacerbation ([place]) [J44.1] Acute UTI [N39.0] Anemia, unspecified type [D64.9] COVID-19 virus infection [U07.1] COVID-19 [U07.1]  [facility]/MWF/ Rt Permcath  End-stage renal disease with hypokalemia on dialysis.  Plan to dialyze today to maintain outpatient schedule. Potassium corrected to 4.8. Will dialyze on 2K bath. Next treatment scheduled for Monday  2. Anemia of chronic kidney disease Lab Results  Component Value Date   HGB 7.6 (L) 02/06/2021  EPO 10000 units IV with dialysis treatment  3. Secondary Hyperparathyroidism:   Lab Results  Component Value Date   CALCIUM 8.0 (L) 02/06/2021   PHOS 7.2 (H) 01/12/2021    Continue Cholecalciferol daily    LOS: 1 [place] 12/9/202210:24 AM

## 2021-02-06 NOTE — Progress Notes (Signed)
Initial Nutrition Assessment  DOCUMENTATION CODES:   Not applicable  INTERVENTION:   -Nepro Shake po BID, each supplement provides 425 kcal and 19 grams protein  -30 ml Prosource Plus TID, each supplement provides 100 kcals and 15 grams protein -Renal MVI daily  NUTRITION DIAGNOSIS:   Increased nutrient needs related to wound healing as evidenced by estimated needs.  GOAL:   Patient will meet greater than or equal to 90% of their needs  MONITOR:   PO intake, Supplement acceptance, Labs, Weight trends, Skin, I & O's  REASON FOR ASSESSMENT:   Rounds    ASSESSMENT:   Phillip Chavez is a 80 y.o. male with medical history significant for HTN, CAD, COPD, ESRD recently started on HD MWF via tunneled catheter right chest, recently hospitalized from 11/5-11/26 with respiratory failure of multifactorial etiology including COVID, bacterial pneumonia and fluid overload from severe aortic regurg who was sent to the ER for evaluation of shortness of breath.  During his recent stay he was also diagnosed with sepsis secondary to Enterococcus bacteremia and is currently on Invanz.  Patient was weaned off of oxygen prior to his recent discharge.  On the day of arrival patient reports that he was having trouble catching his breath.  He denied chest pain or cough.  Patient denies nausea, vomiting, abdominal pain or diarrhea.  EMS reported a temperature of 100.4 and he arrived on 2 L oxygen  Pt admitted with acute respiratory failure with hypoxia and COVID-19 virus infection.   Reviewed I/O's: +120 ml x 24 hours and +900 ml since admission  Pt unavailable at time of visit. Attempted to speak with pt via call to hospital room phone, however, unable to reach. RD unable to obtain further nutrition-related history or complete nutrition-focused physical exam at this time.    Pt currently on a heart healthy diet. Noted meal completions 0%. Per discussion with nutritional services department, pt wife is  requesting pt be placed on a renal diet; will adjust orders.   Per [facility] notes, pt with unstageable sacral wound.   Reviewed wt hx; pt has experienced a 6% wt loss over the past 2 weeks, which is signifnicant for time frame. Unsure of EDW.   Pt with increased nutritional needs for wound healing and would benefit from addition of oral nutrition supplements.   Medications reviewed and include vitamin D3, marinol, epogen, and lovaza.   Labs reviewed.   Diet Order:   Diet Order             Diet Heart Room service appropriate? Yes; Fluid consistency: Thin  Diet effective now                   EDUCATION NEEDS:   No education needs have been identified at this time  Skin:  Skin Assessment: Skin Integrity Issues: Skin Integrity Issues:: Stage III Stage III: sacrum  Last BM:  02/06/21  Height:   Ht Readings from Last 1 Encounters:  02/05/21 5\' 5"  (1.651 m)    Weight:   Wt Readings from Last 1 Encounters:  02/05/21 63.5 kg    Ideal Body Weight:  56.8 kg  BMI:  Body mass index is 23.3 kg/m.  Estimated Nutritional Needs:   Kcal:  1900-2100  Protein:  95-110 grams  Fluid:  1000 ml + UOP    Loistine Chance, RD, LDN, [place] Registered Dietitian II Certified Diabetes Care and Education Specialist Please refer to [facility] for RD and/or RD on-call/weekend/after hours pager

## 2021-02-06 NOTE — Progress Notes (Addendum)
PROGRESS NOTE    Phillip Chavez  TGY:[id number] DOB: 02/25/1941 DOA: 02/05/2021 PCP: [facility] [place]  [id number]   Assessment & Plan:   Principal Problem:   Acute respiratory failure with hypoxia ([place]) Active Problems:   COPD (chronic obstructive pulmonary disease) (HCC)   Elevated troponin   Aortic valve regurgitation   COVID-19 virus infection   Bacteremia due to Enterococcus   Acute on chronic anemia   ESRD on hemodialysis ([place])   Pressure injury of skin   Phillip Chavez is a 80 y.o. male with medical history significant for HTN, CAD, COPD, ESRD recently started on HD MWF via tunneled catheter right chest, recently hospitalized from 11/5-11/26 with respiratory failure of multifactorial etiology including COVID, bacterial pneumonia and fluid overload from severe aortic regurg who presented from SNF to the ER for evaluation of shortness of breath.   During his recent stay he was also diagnosed with sepsis secondary to Enterococcus bacteremia and had completed abx course prior to discharge.  Patient was weaned off of oxygen prior to his recent discharge.  On the day of arrival patient reports that he was having trouble catching his breath when he woke up in the middle of the night.  EMS reported a temperature of 100.4 and he arrived on 2 L oxygen     Acute respiratory failure with hypoxia, ruled out Likely OSA --no hypoxia documented.  Pt reported waking up feeling like he couldn't catch his breath, likely an episode of panic or sleep apnea.   --In the ED, pt was noted to desat while sleeping, so likely has sleep apnea, but while awake, was able to maintain 98% on room air.   --CXR showed no acute finding, and actually improved aeration in the right lung base is noted when compare with the prior study.  Both procal and BNP improved from prior hospitalization as well.  Abx d/c'ed.  Reported fever --temp 100.4 noted per EMS, no fever documented since presentation.  No  leukocytosis, procal improved from prior. --received vanc/meropenem x1 on presentation, abx since held. Plan: --f/u blood cx --treat for UTI  Presume UTI (not CAUTI) --urine cx was obtained by SNF shortly before hospital presentation, presumably obtained at the time of initial Foley insertion.   --SNF reported urine cx grew ESBL kleb Plan: --after discussion with ID Dr. Delaine Lame, will treat with Ertapenem x1 today followed by one dose of fosfomycin in 48 hrs   Urinary retention with Foley on presentation --Per SNF, pt had no urine output for about a week (pt is dialysis pt), and complained of abdominal pain, with 1L urine return when Foley inserted at SNF.  SNF had ordered Ertapenem IM for empiric tx for UTI, but pt refused.  Abdominal pain resolved after bladder decompression, and pt denied dysuria. --the UA and urine cx obtained in the ED were from the existing Foley, so likely to be contaminated.   Plan: --treat UTI based on urine cx obtained at SNF --d/c Foley --Recommend I/O cath every 2 days if pt couldn't void on his own --start Flomax  Recent COVID-19 virus infection, not currently active - COVID-positive on 11/12 -S/p remdesivir in November 2022 --no need for repeat tx     Elevated troponin - Suspect demand ischemia - Troponin 114, and trended down to 95   Recent Bacteremia due to Enterococcus - already completed treatment prior to last discharge.  The Invanz ordered at the facility was for empiric tx of UTI, and pt reportedly declined.  Acute on chronic anemia - Hemoglobin 7.6 down from baseline of 10.  No signs of bleeding. --EPO with dialysis     COPD (chronic obstructive pulmonary disease) (HCC) - Not acutely exacerbated     Aortic valve regurgitation, severe - Last echo was on 11/5 with severe aortic regurg     ESRD on hemodialysis ([place]) - iHD per nephrology   DVT prophylaxis: Heparin SQ Code Status: Full code  Family Communication: wife updated at  bedside today  Level of care: Med-Surg Dispo:   The patient is from: SNF Anticipated d/c is to: SNF Anticipated d/c date is: tomorrow  Patient currently is medically ready to d/c.   Subjective and Interval History:  No new complaint today.    Pt was planned for discharge back to SNF today, however, wife said she and son had decided to take pt home, so needed some time to prepare, so I agreed to hold discharge until tomorrow.  TOC asked to assist with HH needs, however, TOC got back to me later and said son didn't want to take pt home but would like pt to go to a different SNF.  I let TOC know that pt will go back to original SNF tomorrow.   Objective: Vitals:   02/06/21 1630 02/06/21 1645 02/06/21 1700 02/06/21 1715  BP: (!) 141/34 (!) 131/33 (!) 132/40 (!) 138/39  Pulse: 79 78 76 76  Resp: (!) 21 (!) 22 (!) 22 (!) 21  Temp:      TempSrc:      SpO2:      Weight:      Height:        Intake/Output Summary (Last 24 hours) at 02/06/2021 1722 Last data filed at 02/06/2021 1300 Gross per 24 hour  Intake 120 ml  Output --  Net 120 ml   Filed Weights   02/05/21 0111 02/06/21 1532  Weight: 63.5 kg 72.8 kg    Examination:   Constitutional: NAD, AAOx3 HEENT: conjunctivae and lids normal, EOMI CV: No cyanosis.   RESP: normal respiratory effort Extremities: No effusions, edema in BLE SKIN: warm, dry Neuro: II - XII grossly intact.     Data Reviewed: I have personally reviewed following labs and imaging studies  CBC: Recent Labs  Lab 02/05/21 0130 02/06/21 0455  WBC 6.6 8.0  NEUTROABS 4.0  --   HGB 7.6* 7.6*  HCT 23.8* 23.6*  MCV 89.8 90.4  PLT 145* 737   Basic Metabolic Panel: Recent Labs  Lab 02/05/21 0130 02/05/21 0342 02/06/21 0455  NA 135  --  136  K 3.0*  --  4.8  CL 98  --  101  CO2 27  --  26  GLUCOSE 90  --  152*  BUN 12  --  39*  CREATININE 3.13*  --  5.01*  CALCIUM 7.6*  --  8.0*  MG  --  1.6* 1.9   GFR: Estimated Creatinine Clearance:  9.9 mL/min (A) (by C-G formula based on SCr of 5.01 mg/dL (H)). Liver Function Tests: Recent Labs  Lab 02/05/21 0130  AST 66*  ALT 112*  ALKPHOS 94  BILITOT 0.7  PROT 6.1*  ALBUMIN 2.0*   No results for input(s): LIPASE, AMYLASE in the last 168 hours. No results for input(s): AMMONIA in the last 168 hours. Coagulation Profile: No results for input(s): INR, PROTIME in the last 168 hours. Cardiac Enzymes: No results for input(s): CKTOTAL, CKMB, CKMBINDEX, TROPONINI in the last 168 hours. BNP (last 3 results) No  results for input(s): PROBNP in the last 8760 hours. HbA1C: No results for input(s): HGBA1C in the last 72 hours. CBG: No results for input(s): GLUCAP in the last 168 hours. Lipid Profile: No results for input(s): CHOL, HDL, LDLCALC, TRIG, CHOLHDL, LDLDIRECT in the last 72 hours. Thyroid Function Tests: No results for input(s): TSH, T4TOTAL, FREET4, T3FREE, THYROIDAB in the last 72 hours. Anemia Panel: Recent Labs    02/05/21 0854  VITAMINB12 521  FOLATE 20.0  FERRITIN 488*  TIBC 189*  IRON 14*  RETICCTPCT 1.5   Sepsis Labs: Recent Labs  Lab 02/05/21 0130  PROCALCITON 0.32  LATICACIDVEN 1.3    Recent Results (from the past 240 hour(s))  Urine Culture     Status: Abnormal (Preliminary result)   Collection Time: 02/05/21  1:30 AM   Specimen: Urine, Random  Result Value Ref Range Status   Specimen Description   Final    URINE, RANDOM Performed at [facility], [address] [place], [place] [address]    Special Requests   Final    NONE Performed at [facility], [address], [place], [place] [address]    Culture (A)  Final    >=100,000 COLONIES/mL KLEBSIELLA PNEUMONIAE SUSCEPTIBILITIES TO FOLLOW Performed at [facility], [place] [address], [place], [place] [address]    Report Status PENDING  Incomplete  Culture, blood (Routine X 2) w Reflex to ID Panel     Status: None (Preliminary result)   Collection Time: 02/05/21   1:30 AM   Specimen: BLOOD  Result Value Ref Range Status   Specimen Description BLOOD LEFT ASSIST CONTROL  Final   Special Requests   Final    BOTTLES DRAWN AEROBIC AND ANAEROBIC Blood Culture adequate volume   Culture   Final    NO GROWTH 1 DAY Performed at [facility], [address], [place], [place] [address]    Report Status PENDING  Incomplete  Culture, blood (Routine X 2) w Reflex to ID Panel     Status: None (Preliminary result)   Collection Time: 02/05/21  1:30 AM   Specimen: BLOOD  Result Value Ref Range Status   Specimen Description BLOOD RIGHT ASSIST CONTROL  Final   Special Requests   Final    BOTTLES DRAWN AEROBIC AND ANAEROBIC Blood Culture adequate volume   Culture   Final    NO GROWTH 1 DAY Performed at [name], [address] [place], [place] [address]    Report Status PENDING  Incomplete  Resp Panel by RT-PCR (Flu A&B, Covid) Urine, Catheterized     Status: Abnormal   Collection Time: 02/05/21  1:30 AM   Specimen: Urine, Catheterized; Nasopharyngeal(NP) swabs in vial transport medium  Result Value Ref Range Status   SARS Coronavirus 2 by RT PCR POSITIVE (A) NEGATIVE Final    Comment: RESULT CALLED TO, READ BACK BY AND VERIFIED WITH: AUSTIN REEVES@[id number]  02/05/21 RH (NOTE) SARS-CoV-2 target nucleic acids are DETECTED.  The SARS-CoV-2 RNA is generally detectable in upper respiratory specimens during the acute phase of infection. Positive results are indicative of the presence of the identified virus, but do not rule out bacterial infection or co-infection with other pathogens not detected by the test. Clinical correlation with patient history and other diagnostic information is necessary to determine patient infection status. The expected result is Negative.  Fact Sheet for Patients: EntrepreneurPulse.com.au  Fact Sheet for Healthcare Providers: IncredibleEmployment.be  This test is not yet  approved or cleared by  the [employer] and  has been authorized for detection and/or diagnosis of SARS-CoV-2 by FDA under an Emergency Use Authorization (EUA).  This EUA will remain in effect (meaning this test can be use d) for the duration of  the COVID-19 declaration under Section 564(b)(1) of the Act, 21 U.S.C. section 360bbb-3(b)(1), unless the authorization is terminated or revoked sooner.     Influenza A by PCR NEGATIVE NEGATIVE Final   Influenza B by PCR NEGATIVE NEGATIVE Final    Comment: (NOTE) The Xpert Xpress SARS-CoV-2/FLU/RSV plus assay is intended as an aid in the diagnosis of influenza from Nasopharyngeal swab specimens and should not be used as a sole basis for treatment. Nasal washings and aspirates are unacceptable for Xpert Xpress SARS-CoV-2/FLU/RSV testing.  Fact Sheet for Patients: EntrepreneurPulse.com.au  Fact Sheet for Healthcare Providers: IncredibleEmployment.be  This test is not yet approved or cleared by the [place] FDA and has been authorized for detection and/or diagnosis of SARS-CoV-2 by FDA under an Emergency Use Authorization (EUA). This EUA will remain in effect (meaning this test can be used) for the duration of the COVID-19 declaration under Section 564(b)(1) of the Act, 21 U.S.C. section 360bbb-3(b)(1), unless the authorization is terminated or revoked.  Performed at [facility], [address] [place], [place] [address]       Radiology Studies: CT HEAD WO CONTRAST (5MM)  Result Date: 02/05/2021 CLINICAL DATA:  Unwitnessed fall. EXAM: CT HEAD WITHOUT CONTRAST TECHNIQUE: Contiguous axial images were obtained from the base of the skull through the vertex without intravenous contrast. COMPARISON:  None. FINDINGS: Brain: No evidence of acute infarction, hemorrhage, hydrocephalus, extra-axial collection or mass lesion/mass effect. Vascular: No hyperdense vessel or unexpected  calcification. Skull: Normal. Negative for fracture or focal lesion. Sinuses/Orbits: No acute finding. Other: None. IMPRESSION: No acute intracranial abnormality is noted. Electronically Signed   By: Marijo Conception M.D.   On: 02/05/2021 08:08   DG Chest Portable 1 View  Result Date: 02/05/2021 CLINICAL DATA:  Shortness of breath EXAM: PORTABLE CHEST 1 VIEW COMPARISON:  01/10/2021 FINDINGS: Cardiac shadow is stable. Aortic calcifications are noted. New dialysis catheter is noted in satisfactory position. Chronic fibrotic changes are noted bilaterally similar to that seen on the prior study. Improved aeration in the right lung base is noted when compare with the prior study. No bony abnormality is noted. IMPRESSION: Chronic changes without acute abnormality. Electronically Signed   By: Inez Catalina M.D.   On: 02/05/2021 01:27     Scheduled Meds:  (feeding supplement) PROSource Plus  30 mL Oral TID BM   Chlorhexidine Gluconate Cloth  6 each Topical Daily   cholecalciferol  1,000 Units Oral Daily   dronabinol  5 mg Oral BID AC   [START ON 02/09/2021] epoetin (EPOGEN/PROCRIT) injection  10,000 Units Intravenous Q M,W,F-HD   [START ON 02/07/2021] feeding supplement (NEPRO CARB STEADY)  237 mL Oral BID BM   heparin  5,000 Units Subcutaneous Q8H   multivitamin  1 tablet Oral QHS   mupirocin ointment   Topical BID   omega-3 acid ethyl esters  3 g Oral BID   Continuous Infusions:  ertapenem       LOS: 1 day    Enzo Bi, MD [facility] If 7PM-7AM, please contact night-coverage 02/06/2021, 5:22 PM

## 2021-02-06 NOTE — TOC Initial Note (Addendum)
Transition of Care [name]) - Initial/Assessment Note    Patient Details  Name: Phillip Chavez MRN: [id number] Date of Birth: 01/11/1941  Transition [facility]) CM/SW Contact:    Kerin Salen, RN Phone Number: 02/06/2021, 2:36 PM  Clinical Narrative: TOCRN spoke with patient wife is at bedside. Patient and wife indicates that patient will be discharging home will not return to [facility]. Wife states patient was independent with ADL's, driving to medical visits, exercising and walking three miles 3x week prior to hospitalization. Wife says son Shanon Brow lives nearby and built a ramp for patient and purchased a hospital bed. They are able to care for him at home. Use [facility] in [place], Dr. [name], PCP on [address] In [place]. Wife say patient does not use assisted device for ambulation at home. HD MWF, uses the [facility] in [place].                Barriers to Discharge: Continued Medical Work up   Patient Goals and CMS Choice Patient states their goals for this hospitalization and ongoing recovery are:: Prefer to go home   Choice offered to / list presented to : Spouse  Expected Discharge Plan and Services         Living arrangements for the past 2 months: [name]                                      Prior Living Arrangements/Services Living arrangements for the past 2 months: [place] Lives with:: Facility Resident Patient language and need for interpreter reviewed:: Yes Do you feel safe going back to the place where you live?: Yes      Need for Family Participation in Patient Care: Yes (Comment) Care giver support system in place?: Yes (comment)   Criminal Activity/Legal Involvement Pertinent to Current Situation/Hospitalization: No - Comment as needed  Activities of Daily Living      Permission Sought/Granted Permission sought to share information with : Case Manager                Emotional  Assessment Appearance:: Appears stated age Attitude/Demeanor/Rapport: Engaged Affect (typically observed): Accepting Orientation: : Oriented to Self, Oriented to Place, Oriented to  Time, Oriented to Situation Alcohol / Substance Use: Not Applicable Psych Involvement: No (comment)  Admission diagnosis:  Shortness of breath [R06.02] COPD exacerbation (HCC) [J44.1] Acute UTI [N39.0] Anemia, unspecified type [D64.9] COVID-19 virus infection [U07.1] COVID-19 [U07.1] Patient Active Problem List   Diagnosis Date Noted   Pressure injury of skin 02/06/2021   Bacteremia due to Enterococcus 02/05/2021   Acute on chronic anemia 02/05/2021   ESRD on hemodialysis ([place]) 02/05/2021   COVID-19 virus infection 01/14/2021   Aortic valve regurgitation    CAP (community acquired pneumonia) 01/03/2021   Sepsis ([place]) 01/03/2021   COPD (chronic obstructive pulmonary disease) ([place]) 01/03/2021   GERD (gastroesophageal reflux disease) 01/03/2021   HTN (hypertension)    HLD (hyperlipidemia)    Acute respiratory failure with hypoxia (HCC)    Elevated troponin    AKI (acute kidney injury) ([place])    Normocytic anemia    PCP:  [facility] [place]:   [place] [address] [place] [address] Phone: [phone] Fax: [phone]     Social Determinants of Health (SDOH) Interventions    Readmission Risk  Interventions Readmission Risk Prevention Plan 02/06/2021  Transportation Screening Complete  PCP or Specialist Appt within 3-5 Days Complete  HRI or Home Care Consult Complete  Social Work Consult for [place] Planning/Counseling Complete  Palliative Care Screening Not Applicable  Medication Review Press photographer) Complete  Some recent data might be hidden

## 2021-02-06 NOTE — Consult Note (Signed)
[place] Nurse Consult Note: Patient receiving care in [facility]. Spouse in room. Reason for Consult: sacral wound Wound type: technically it is unstageable due to the think white layer over the wound bed. Pressure Injury POA: Yes Measurement: 2 cm x 0.8 cm Wound bed: thin white  Drainage (amount, consistency, odor) none Periwound: intact Dressing procedure/placement/frequency: Due to the national shortage of Santyl, I have ordered the following: Apply mupirocin ointment to sacral/coccyx wound, cover with a foam dressing. Foam dressing can be used up to 3 days.  Monitor the wound area(s) for worsening of condition such as: Signs/symptoms of infection,  Increase in size,  Development of or worsening of odor, Development of pain, or increased pain at the affected locations.  Notify the medical team if any of these develop.  Thank you for the consult.  Discussed plan of care with the patient and bedside nurse.  [place] nurse will not follow at this time.  Please re-consult the [place] team if needed.  Val Riles, RN, MSN, CWOCN, CNS-BC, pager [phone]

## 2021-02-07 LAB — RESPIRATORY PANEL BY PCR

## 2021-02-07 LAB — CBC
HCT: 23.3 % — ABNORMAL LOW (ref 39.0–52.0)
Hemoglobin: 7.4 g/dL — ABNORMAL LOW (ref 13.0–17.0)
MCH: 28 pg (ref 26.0–34.0)
MCHC: 31.8 g/dL (ref 30.0–36.0)
MCV: 88.3 fL (ref 80.0–100.0)
Platelets: 160 10*3/uL (ref 150–400)
RBC: 2.64 MIL/uL — ABNORMAL LOW (ref 4.22–5.81)
RDW: 17 % — ABNORMAL HIGH (ref 11.5–15.5)
WBC: 10.5 10*3/uL (ref 4.0–10.5)
nRBC: 0 % (ref 0.0–0.2)

## 2021-02-07 LAB — MAGNESIUM: Magnesium: 1.6 mg/dL — ABNORMAL LOW (ref 1.7–2.4)

## 2021-02-07 LAB — BASIC METABOLIC PANEL
Anion gap: 10 (ref 5–15)
BUN: 24 mg/dL — ABNORMAL HIGH (ref 8–23)
CO2: 29 mmol/L (ref 22–32)
Calcium: 7.6 mg/dL — ABNORMAL LOW (ref 8.9–10.3)
Chloride: 98 mmol/L (ref 98–111)
Creatinine, Ser: 3.02 mg/dL — ABNORMAL HIGH (ref 0.61–1.24)
GFR, Estimated: 20 mL/min — ABNORMAL LOW (ref >60)
Glucose, Bld: 91 mg/dL (ref 70–99)
Potassium: 3.2 mmol/L — ABNORMAL LOW (ref 3.5–5.1)
Sodium: 137 mmol/L (ref 135–145)

## 2021-02-07 LAB — HEPATITIS B SURFACE ANTIBODY, QUANTITATIVE: Hep B S AB Quant (Post): <3.1 m[IU]/mL — ABNORMAL LOW (ref >9.9)

## 2021-02-07 LAB — URINE CULTURE: Culture: >=100000 — AB

## 2021-02-07 MED ORDER — IPRATROPIUM-ALBUTEROL 0.5-2.5 (3) MG/3ML IN SOLN
3.0000 mL | Freq: Once | RESPIRATORY_TRACT | Status: AC
Start: 2021-02-07 — End: 2021-02-07
  Administered 2021-02-07: 08:00:00 3 mL via RESPIRATORY_TRACT
  Filled 2021-02-07: qty 3

## 2021-02-07 MED ORDER — HEPARIN SODIUM (PORCINE) 1000 UNIT/ML DIALYSIS
1000.0000 [IU] | INTRAMUSCULAR | Status: DC | PRN
  Filled 2021-02-07: qty 1

## 2021-02-07 MED ORDER — POTASSIUM CHLORIDE CRYS ER 20 MEQ PO TBCR
20.0000 meq | EXTENDED_RELEASE_TABLET | Freq: Once | ORAL | Status: AC
  Administered 2021-02-07: 16:00:00 20 meq via ORAL
  Filled 2021-02-07: qty 1

## 2021-02-07 MED ORDER — SODIUM CHLORIDE 0.9 % IV SOLN: 100.0000 mL | INTRAVENOUS | Status: DC | PRN

## 2021-02-07 MED ORDER — TAMSULOSIN HCL 0.4 MG PO CAPS
0.4000 mg | ORAL_CAPSULE | Freq: Every day | ORAL | Status: DC
  Administered 2021-02-07 – 2021-02-08 (×2): 0.4 mg via ORAL
  Filled 2021-02-07 (×2): qty 1

## 2021-02-07 MED ORDER — PENTAFLUOROPROP-TETRAFLUOROETH EX AERO
1.0000 "application " | INHALATION_SPRAY | CUTANEOUS | Status: DC | PRN
  Filled 2021-02-07: qty 30

## 2021-02-07 MED ORDER — ALTEPLASE 2 MG IJ SOLR
2.0000 mg | Freq: Once | INTRAMUSCULAR | Status: DC | PRN
  Filled 2021-02-07: qty 2

## 2021-02-07 MED ORDER — LIDOCAINE-PRILOCAINE 2.5-2.5 % EX CREA
1.0000 "application " | TOPICAL_CREAM | CUTANEOUS | Status: DC | PRN
  Filled 2021-02-07: qty 5

## 2021-02-07 MED ORDER — ALPRAZOLAM 0.25 MG PO TABS
0.2500 mg | ORAL_TABLET | Freq: Once | ORAL | Status: AC
  Administered 2021-02-07: 0.25 mg via ORAL
  Filled 2021-02-07: qty 1

## 2021-02-07 MED ORDER — LIDOCAINE HCL (PF) 1 % IJ SOLN
5.0000 mL | INTRAMUSCULAR | Status: DC | PRN
  Filled 2021-02-07: qty 5

## 2021-02-07 MED ORDER — IPRATROPIUM-ALBUTEROL 0.5-2.5 (3) MG/3ML IN SOLN
3.0000 mL | Freq: Three times a day (TID) | RESPIRATORY_TRACT | Status: DC
  Administered 2021-02-07 – 2021-02-09 (×6): 3 mL via RESPIRATORY_TRACT
  Filled 2021-02-07 (×7): qty 3

## 2021-02-07 NOTE — Progress Notes (Signed)
Patient and wife refused bed alarms at this time, patient alert and oriented, educated about safety, will continue to monitor.

## 2021-02-07 NOTE — TOC Progression Note (Addendum)
Transition of Care [facility]) - Progression Note    Patient Details  Name: Phillip Chavez MRN: [id number] Date of Birth: Jun 26, 1940  Transition of Care [facility]) CM/SW [place], LCSW Phone Number: 02/07/2021, 9:07 AM  Clinical Narrative:   Per notes and handoff, patient to go back to [facility] today.  Attempted calls to [place] at [facility]. No answer. Left VM for both requesting a return call.  9:40- Spoke with Tammy from [facility] who stated they can take patient back today if medically ready. Called son Phillip Chavez with update. He stated he is confused about patient's medical status and thinks patient's wife (who is at bedside) is confused. He asked if MD can call him. He discussed wanting patient to move to another SNF. Sent list from [employer]. Explained that SW at [facility] can assist with transfer to another SNF.  Updated RN and MD.  10:20- Per MD patient not medically ready today, possibly tomorrow. Called [facility] at [facility]. [facility] cannot take Sunday admissions, so it would have to be Monday. Notified MD.  12:07- Call from son asking about private pay aides and Home Health options. Explained options and provided resources. He stated he is trying to think about all options. Informed him per MD patient should be medically ready to go back to [facility] on Sunday and that [facility] can take him on Monday. He verbalized understanding. He stated they would prefer patient have a private room if available, notified Tammy with [facility].   2:12- Received VM from son stating he and patient's wife have agreed for patient to go back to [facility]. Tammy with [facility] responded that they should be able to get patient a private room.    Barriers to Discharge: Continued Medical Work up  Expected Discharge Plan and Services         Living arrangements for the past 2 months: [place] (SDOH) Interventions    Readmission Risk  Interventions Readmission Risk Prevention Plan 02/06/2021  Transportation Screening Complete  PCP or Specialist Appt within 3-5 Days Complete  HRI or [place] Complete  Social Work Consult for [place] Planning/Counseling Complete  Palliative Care Screening Not Applicable  Medication Review Press photographer) Complete  Some recent data might be hidden

## 2021-02-07 NOTE — Progress Notes (Signed)
PROGRESS NOTE    Phillip Chavez  TMH:[id number] DOB: [birth year] DOA: 02/05/2021 PCP: [facility]  101A/101A-AA   Assessment & Plan:   Principal Problem:   Acute respiratory failure with hypoxia ([place]) Active Problems:   COPD (chronic obstructive pulmonary disease) (HCC)   Elevated troponin   Aortic valve regurgitation   COVID-19 virus infection   Bacteremia due to Enterococcus   Acute on chronic anemia   ESRD on hemodialysis ([place])   Pressure injury of skin   Phillip Chavez is a 80 y.o. male with medical history significant for HTN, CAD, COPD, ESRD recently started on HD MWF via tunneled catheter right chest, recently hospitalized from 11/5-11/26 with respiratory failure of multifactorial etiology including COVID, bacterial pneumonia and fluid overload from severe aortic regurg who presented from SNF to the ER for evaluation of shortness of breath.   During his recent stay he was also diagnosed with sepsis secondary to Enterococcus bacteremia and had completed abx course prior to discharge.  Patient was not needing O2 at rest prior to his recent discharge.  On the day of arrival patient reports that he was having trouble catching his breath when he woke up in the middle of the night.  EMS reported a temperature of 100.4 and he arrived on 2 L oxygen   Respiratory distress with chronic hypoxia Likely OSA --Pt reported waking up feeling like he couldn't catch his breath, likely an episode of anxiety or sleep apnea.   --In the ED, pt was noted to desat while sleeping, so likely has sleep apnea, but while awake, was able to maintain 98% on room air.   --CXR showed no acute finding, and actually improved aeration in the right lung base is noted when compare with the prior study.  Both procal and BNP improved from prior hospitalization as well.  Abx d/c'ed. --Pt was found to desat down to 85% with walking, so needs 2L with ambulation. Plan: --RVP --DuoNeb TID  Reported  fever --temp 100.4 noted per EMS, no fever documented since presentation.  No leukocytosis, procal improved from prior. --received vanc/meropenem x1 on presentation, abx since held. Plan: --f/u blood cx  Presumed UTI from ESBL Kleb (not CAUTI) --urine cx was obtained by SNF shortly before hospital presentation, presumably obtained at the time of initial Foley insertion.  SNF had ordered Ertapenem IM for empiric tx for UTI, but pt refused.  SNF reported urine cx later grew ESBL kleb.  Another urine cx obtained in the ED after presentation off of the same Foley also grew the same. --after discussion with ID Dr. Delaine Lame, will treat with Ertapenem x1 (02/06/21) followed by one dose of fosfomycin in 48 hrs  Plan: --fosfomycin to be given night of 12/11  Urinary retention with Foley on presentation --Per SNF, pt had no urine output for about a week (pt is dialysis pt), and complained of abdominal pain.  Foley inserted with 1L urine return at SNF.    Abdominal pain resolved after bladder decompression, and pt denied dysuria. --Foley d/c'ed after presentation. Plan: --Recommend I/O cath every 2 days if pt couldn't void on his own --cont Flomax (new) --avoid chronic Foley  Recent COVID-19 virus infection, not currently active - COVID-positive on 11/12 -S/p remdesivir in November 2022 --no need for repeat tx     Elevated troponin - Suspect demand ischemia - Troponin 114, and trended down to 95   Recent Bacteremia due to Enterococcus - already completed treatment prior to last discharge.  The Invanz ordered at the facility was for empiric tx of UTI, and pt reportedly declined.     Acute on chronic anemia - Hemoglobin 7.6 down from baseline of 10.  No signs of bleeding. --EPO with dialysis     COPD (chronic obstructive pulmonary disease) (HCC) - DuoNeb TID     Aortic valve regurgitation, severe - Last echo was on 11/5 with severe aortic regurg     ESRD on hemodialysis ([place]) - iHD  per nephrology   DVT prophylaxis: Heparin SQ Code Status: Full code  Family Communication: wife updated at bedside today  Level of care: Med-Surg Dispo:   The patient is from: SNF Anticipated d/c is to: SNF Anticipated d/c date is: Monday (that's when SNF can receive pt)  Patient currently is medically ready to d/c.   Subjective and Interval History:  Pt had respiratory distress this morning, which improved with DuoNeb.  Having BM's and eating some.  No N/V.     Objective: Vitals:   02/07/21 0745 02/07/21 1326 02/07/21 1404 02/07/21 1637  BP:  (!) 123/41  (!) 118/41  Pulse:  (!) 104  96  Resp:  16  20  Temp:  97.7 F (36.5 C)  98 F (36.7 C)  TempSrc:    Oral  SpO2: 98% 91% 96% 96%  Weight:      Height:        Intake/Output Summary (Last 24 hours) at 02/07/2021 1650 Last data filed at 02/07/2021 1022 Gross per 24 hour  Intake 120 ml  Output 200 ml  Net -80 ml   Filed Weights   02/05/21 0111 02/06/21 1532 02/06/21 1905  Weight: 63.5 kg 72.8 kg 73.3 kg    Examination:   Constitutional: NAD, AAOx3, frail, eating breakfast HEENT: conjunctivae and lids normal, EOMI CV: No cyanosis.   RESP: normal respiratory effort, on 2L Extremities: No effusions, edema in BLE SKIN: warm, dry Neuro: II - XII grossly intact.     Data Reviewed: I have personally reviewed following labs and imaging studies  CBC: Recent Labs  Lab 02/05/21 0130 02/06/21 0455 02/07/21 0524  WBC 6.6 8.0 10.5  NEUTROABS 4.0  --   --   HGB 7.6* 7.6* 7.4*  HCT 23.8* 23.6* 23.3*  MCV 89.8 90.4 88.3  PLT 145* 151 702   Basic Metabolic Panel: Recent Labs  Lab 02/05/21 0130 02/05/21 0342 02/06/21 0455 02/07/21 0524  NA 135  --  136 137  K 3.0*  --  4.8 3.2*  CL 98  --  101 98  CO2 27  --  26 29  GLUCOSE 90  --  152* 91  BUN 12  --  39* 24*  CREATININE 3.13*  --  5.01* 3.02*  CALCIUM 7.6*  --  8.0* 7.6*  MG  --  1.6* 1.9 1.6*   GFR: Estimated Creatinine Clearance: 16.4 mL/min  (A) (by C-G formula based on SCr of 3.02 mg/dL (H)). Liver Function Tests: Recent Labs  Lab 02/05/21 0130  AST 66*  ALT 112*  ALKPHOS 94  BILITOT 0.7  PROT 6.1*  ALBUMIN 2.0*   No results for input(s): LIPASE, AMYLASE in the last 168 hours. No results for input(s): AMMONIA in the last 168 hours. Coagulation Profile: No results for input(s): INR, PROTIME in the last 168 hours. Cardiac Enzymes: No results for input(s): CKTOTAL, CKMB, CKMBINDEX, TROPONINI in the last 168 hours. BNP (last 3 results) No results for input(s): PROBNP in the last 8760 hours. HbA1C: No results  for input(s): HGBA1C in the last 72 hours. CBG: No results for input(s): GLUCAP in the last 168 hours. Lipid Profile: No results for input(s): CHOL, HDL, LDLCALC, TRIG, CHOLHDL, LDLDIRECT in the last 72 hours. Thyroid Function Tests: No results for input(s): TSH, T4TOTAL, FREET4, T3FREE, THYROIDAB in the last 72 hours. Anemia Panel: Recent Labs    02/05/21 0854  VITAMINB12 521  FOLATE 20.0  FERRITIN 488*  TIBC 189*  IRON 14*  RETICCTPCT 1.5   Sepsis Labs: Recent Labs  Lab 02/05/21 0130  PROCALCITON 0.32  LATICACIDVEN 1.3    Recent Results (from the past 240 hour(s))  Urine Culture     Status: Abnormal   Collection Time: 02/05/21  1:30 AM   Specimen: Urine, Random  Result Value Ref Range Status   Specimen Description   Final    URINE, RANDOM Performed at [facility], [address], [place], [place] [address]    Special Requests   Final    NONE Performed at [facility], [address] [place], [place] [address]    Culture (A)  Final    >=100,000 COLONIES/mL KLEBSIELLA PNEUMONIAE Confirmed Extended Spectrum Beta-Lactamase Producer (ESBL).  In bloodstream infections from ESBL organisms, carbapenems are preferred over piperacillin/tazobactam. They are shown to have a lower risk of mortality.    Report Status 02/07/2021 FINAL  Final   Organism ID, Bacteria KLEBSIELLA  PNEUMONIAE (A)  Final      Susceptibility   Klebsiella pneumoniae - MIC*    AMPICILLIN >=32 RESISTANT Resistant     CEFAZOLIN >=64 RESISTANT Resistant     CEFEPIME >=32 RESISTANT Resistant     CEFTRIAXONE >=64 RESISTANT Resistant     CIPROFLOXACIN 0.5 INTERMEDIATE Intermediate     GENTAMICIN <=1 SENSITIVE Sensitive     IMIPENEM <=0.25 SENSITIVE Sensitive     NITROFURANTOIN 64 INTERMEDIATE Intermediate     TRIMETH/SULFA >=320 RESISTANT Resistant     AMPICILLIN/SULBACTAM 16 INTERMEDIATE Intermediate     PIP/TAZO <=4 SENSITIVE Sensitive     * >=100,000 COLONIES/mL KLEBSIELLA PNEUMONIAE  Culture, blood (Routine X 2) w Reflex to ID Panel     Status: None (Preliminary result)   Collection Time: 02/05/21  1:30 AM   Specimen: BLOOD  Result Value Ref Range Status   Specimen Description BLOOD LEFT ASSIST CONTROL  Final   Special Requests   Final    BOTTLES DRAWN AEROBIC AND ANAEROBIC Blood Culture adequate volume   Culture   Final    NO GROWTH 2 DAYS Performed at [facility], [address] [place], [place] [address]    Report Status PENDING  Incomplete  Culture, blood (Routine X 2) w Reflex to ID Panel     Status: None (Preliminary result)   Collection Time: 02/05/21  1:30 AM   Specimen: BLOOD  Result Value Ref Range Status   Specimen Description BLOOD RIGHT ASSIST CONTROL  Final   Special Requests   Final    BOTTLES DRAWN AEROBIC AND ANAEROBIC Blood Culture adequate volume   Culture   Final    NO GROWTH 2 DAYS Performed at [facility], [address] [place], [place] [address]    Report Status PENDING  Incomplete  Resp Panel by RT-PCR (Flu A&B, Covid) Urine, Catheterized     Status: Abnormal   Collection Time: 02/05/21  1:30 AM   Specimen: Urine, Catheterized; Nasopharyngeal(NP) swabs in vial transport medium  Result Value Ref Range Status   SARS Coronavirus 2 by RT PCR POSITIVE (  A) NEGATIVE Final    Comment: RESULT CALLED TO, READ BACK BY AND VERIFIED  WITH: AUSTIN REEVES@[id number]  02/05/21 RH (NOTE) SARS-CoV-2 target nucleic acids are DETECTED.  The SARS-CoV-2 RNA is generally detectable in upper respiratory specimens during the acute phase of infection. Positive results are indicative of the presence of the identified virus, but do not rule out bacterial infection or co-infection with other pathogens not detected by the test. Clinical correlation with patient history and other diagnostic information is necessary to determine patient infection status. The expected result is Negative.  Fact Sheet for Patients: EntrepreneurPulse.com.au  Fact Sheet for Healthcare Providers: IncredibleEmployment.be  This test is not yet approved or cleared by the [place] FDA and  has been authorized for detection and/or diagnosis of SARS-CoV-2 by FDA under an Emergency Use Authorization (EUA).  This EUA will remain in effect (meaning this test can be use d) for the duration of  the COVID-19 declaration under Section 564(b)(1) of the Act, 21 U.S.C. section 360bbb-3(b)(1), unless the authorization is terminated or revoked sooner.     Influenza A by PCR NEGATIVE NEGATIVE Final   Influenza B by PCR NEGATIVE NEGATIVE Final    Comment: (NOTE) The Xpert Xpress SARS-CoV-2/FLU/RSV plus assay is intended as an aid in the diagnosis of influenza from Nasopharyngeal swab specimens and should not be used as a sole basis for treatment. Nasal washings and aspirates are unacceptable for Xpert Xpress SARS-CoV-2/FLU/RSV testing.  Fact Sheet for Patients: EntrepreneurPulse.com.au  Fact Sheet for Healthcare Providers: IncredibleEmployment.be  This test is not yet approved or cleared by the [place] FDA and has been authorized for detection and/or diagnosis of SARS-CoV-2 by FDA under an Emergency Use Authorization (EUA). This EUA will remain in effect (meaning this test can be used) for  the duration of the COVID-19 declaration under Section 564(b)(1) of the Act, 21 U.S.C. section 360bbb-3(b)(1), unless the authorization is terminated or revoked.  Performed at [facility], [address], [place], [place] [address]       Radiology Studies: No results found.   Scheduled Meds:  (feeding supplement) PROSource Plus  30 mL Oral TID BM   Chlorhexidine Gluconate Cloth  6 each Topical Daily   cholecalciferol  1,000 Units Oral Daily   dronabinol  5 mg Oral BID AC   [START ON 02/09/2021] epoetin (EPOGEN/PROCRIT) injection  10,000 Units Intravenous Q M,W,F-HD   feeding supplement (NEPRO CARB STEADY)  237 mL Oral BID BM   heparin  5,000 Units Subcutaneous Q8H   ipratropium-albuterol  3 mL Nebulization TID   multivitamin  1 tablet Oral QHS   mupirocin ointment   Topical BID   omega-3 acid ethyl esters  3 g Oral BID   tamsulosin  0.4 mg Oral Daily   Continuous Infusions:  sodium chloride     sodium chloride       LOS: 2 days    Enzo Bi, MD [facility] If 7PM-7AM, please contact night-coverage 02/07/2021, 4:50 PM

## 2021-02-07 NOTE — Progress Notes (Signed)
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  Patient reports to the emergency department with complaints of shortness of breath.  He is admitted for Shortness of breath [R06.02] COPD exacerbation ([place]) [J44.1] Acute UTI [N39.0] Anemia, unspecified type [D64.9] COVID-19 virus infection [U07.1] COVID-19 [U07.1]  Patient is known to our clinic from past hospitalization and was recently started on hemodialysis during previous admission. Patient currently receives outpatient treatments at [facility] on a MWF schedule.   Update Patient found resting in bed, in no acute distress. Family at bedside. He received dialysis treatment yesterday.   Objective:  Vital signs in last 24 hours:  Temp:  [97.6 F (36.4 C)-98.2 F (36.8 C)] 98.2 F (36.8 C) (12/10 0726) Pulse Rate:  [70-116] 74 (12/10 0726) Resp:  [17-23] 18 (12/10 0726) BP: (115-180)/(32-64) 138/52 (12/10 0726) SpO2:  [93 %-100 %] 98 % (12/10 0745) Weight:  [72.8 kg-73.3 kg] 73.3 kg (12/09 1905)  Weight change:  Filed Weights   02/05/21 0111 02/06/21 1532 02/06/21 1905  Weight: 63.5 kg 72.8 kg 73.3 kg    Intake/Output: I/O last 3 completed shifts: In: 120 [P.O.:120] Out: 200 [Other:200]   Intake/Output this shift:  No intake/output data recorded.  Physical Exam: General: In no acute distress   Head: Moist oral mucosal membranes  Eyes: Anicteric  Lungs:  Lungs clear bilaterally, respiration even ,unlabored on 2L supplemental O2  Heart: S1S2, no rubs or gallops  Abdomen:  Soft, nontender  Extremities: Trace peripheral edema.  Neurologic: Nonfocal, moving all four extremities  Skin: No acute lesions or rashes  Access: Right IJ PermCath    Basic Metabolic Panel: Recent Labs  Lab 02/05/21 0130 02/05/21 0342 02/06/21 0455 02/07/21 0524   NA 135  --  136 137  K 3.0*  --  4.8 3.2*  CL 98  --  101 98  CO2 27  --  26 29  GLUCOSE 90  --  152* 91  BUN 12  --  39* 24*  CREATININE 3.13*  --  5.01* 3.02*  CALCIUM 7.6*  --  8.0* 7.6*  MG  --  1.6* 1.9 1.6*     Liver Function Tests: Recent Labs  Lab 02/05/21 0130  AST 66*  ALT 112*  ALKPHOS 94  BILITOT 0.7  PROT 6.1*  ALBUMIN 2.0*    No results for input(s): LIPASE, AMYLASE in the last 168 hours. No results for input(s): AMMONIA in the last 168 hours.  CBC: Recent Labs  Lab 02/05/21 0130 02/06/21 0455 02/07/21 0524  WBC 6.6 8.0 10.5  NEUTROABS 4.0  --   --   HGB 7.6* 7.6* 7.4*  HCT 23.8* 23.6* 23.3*  MCV 89.8 90.4 88.3  PLT 145* 151 160     Cardiac Enzymes: No results for input(s): CKTOTAL, CKMB, CKMBINDEX, TROPONINI in the last 168 hours.  BNP: Invalid input(s): POCBNP  CBG: No results for input(s): GLUCAP in the last 168 hours.  Microbiology: Results for orders placed or performed during the hospital encounter of 02/05/21  Urine Culture     Status: Abnormal (Preliminary result)   Collection Time: 02/05/21  1:30 AM   Specimen: Urine, Random  Result Value Ref Range Status   Specimen Description   Final    URINE, RANDOM Performed at [facility], [address], [place], [place] [address]  Special Requests   Final    NONE Performed at [facility], [address] [place], [place] [address]    Culture (A)  Final    >=100,000 COLONIES/mL KLEBSIELLA PNEUMONIAE SUSCEPTIBILITIES TO FOLLOW Performed at [facility], [place] [address] [place], [place] [address]    Report Status PENDING  Incomplete  Culture, blood (Routine X 2) w Reflex to ID Panel     Status: None (Preliminary result)   Collection Time: 02/05/21  1:30 AM   Specimen: BLOOD  Result Value Ref Range Status   Specimen Description BLOOD LEFT ASSIST CONTROL  Final   Special Requests   Final    BOTTLES DRAWN AEROBIC AND ANAEROBIC Blood Culture adequate  volume   Culture   Final    NO GROWTH 2 DAYS Performed at [facility], [address], [place], [place] [address]    Report Status PENDING  Incomplete  Culture, blood (Routine X 2) w Reflex to ID Panel     Status: None (Preliminary result)   Collection Time: 02/05/21  1:30 AM   Specimen: BLOOD  Result Value Ref Range Status   Specimen Description BLOOD RIGHT ASSIST CONTROL  Final   Special Requests   Final    BOTTLES DRAWN AEROBIC AND ANAEROBIC Blood Culture adequate volume   Culture   Final    NO GROWTH 2 DAYS Performed at [facility], [address] [place], [place] [address]    Report Status PENDING  Incomplete  Resp Panel by RT-PCR (Flu A&B, Covid) Urine, Catheterized     Status: Abnormal   Collection Time: 02/05/21  1:30 AM   Specimen: Urine, Catheterized; Nasopharyngeal(NP) swabs in vial transport medium  Result Value Ref Range Status   SARS Coronavirus 2 by RT PCR POSITIVE (A) NEGATIVE Final    Comment: RESULT CALLED TO, READ BACK BY AND VERIFIED WITH: AUSTIN REEVES@[id number]  02/05/21 RH (NOTE) SARS-CoV-2 target nucleic acids are DETECTED.  The SARS-CoV-2 RNA is generally detectable in upper respiratory specimens during the acute phase of infection. Positive results are indicative of the presence of the identified virus, but do not rule out bacterial infection or co-infection with other pathogens not detected by the test. Clinical correlation with patient history and other diagnostic information is necessary to determine patient infection status. The expected result is Negative.  Fact Sheet for Patients: EntrepreneurPulse.com.au  Fact Sheet for Healthcare Providers: IncredibleEmployment.be  This test is not yet approved or cleared by the [place] FDA and  has been authorized for detection and/or diagnosis of SARS-CoV-2 by FDA under an Emergency Use Authorization (EUA).  This EUA will remain in effect (meaning  this test can be use d) for the duration of  the COVID-19 declaration under Section 564(b)(1) of the Act, 21 U.S.C. section 360bbb-3(b)(1), unless the authorization is terminated or revoked sooner.     Influenza A by PCR NEGATIVE NEGATIVE Final   Influenza B by PCR NEGATIVE NEGATIVE Final    Comment: (NOTE) The Xpert Xpress SARS-CoV-2/FLU/RSV plus assay is intended as an aid in the diagnosis of influenza from Nasopharyngeal swab specimens and should not be used as a sole basis for treatment. Nasal washings and aspirates are unacceptable for Xpert Xpress SARS-CoV-2/FLU/RSV testing.  Fact Sheet for Patients: EntrepreneurPulse.com.au  Fact Sheet for Healthcare Providers: IncredibleEmployment.be  This test is not yet approved or cleared by the [place] FDA and has been authorized for detection and/or diagnosis of SARS-CoV-2 by FDA under an Emergency Use Authorization (EUA).  This EUA will remain in effect (meaning this test can be used) for the duration of the COVID-19 declaration under Section 564(b)(1) of the Act, 21 U.S.C. section 360bbb-3(b)(1), unless the authorization is terminated or revoked.  Performed at [facility], [address] [place], [place] [address]     Coagulation Studies: No results for input(s): LABPROT, INR in the last 72 hours.  Urinalysis: Recent Labs    02/05/21 0130  COLORURINE YELLOW  LABSPEC 1.020  PHURINE 7.0  GLUCOSEU NEGATIVE  HGBUR LARGE*  BILIRUBINUR SMALL*  KETONESUR TRACE*  PROTEINUR >300*  NITRITE NEGATIVE  LEUKOCYTESUR LARGE*       Imaging: No results found.   Medications:      (feeding supplement) PROSource Plus  30 mL Oral TID BM   Chlorhexidine Gluconate Cloth  6 each Topical Daily   cholecalciferol  1,000 Units Oral Daily   dronabinol  5 mg Oral BID AC   [START ON 02/09/2021] epoetin (EPOGEN/PROCRIT) injection  10,000 Units Intravenous Q M,W,F-HD   feeding  supplement (NEPRO CARB STEADY)  237 mL Oral BID BM   heparin  5,000 Units Subcutaneous Q8H   multivitamin  1 tablet Oral QHS   mupirocin ointment   Topical BID   omega-3 acid ethyl esters  3 g Oral BID   tamsulosin  0.4 mg Oral Daily   acetaminophen **OR** acetaminophen, ondansetron **OR** ondansetron (ZOFRAN) IV  Assessment/ Plan:  Mr. TARAY NORMOYLE is a 80 y.o.  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  Patient reports to the emergency department with complaints of shortness of breath.  He is admitted for Shortness of breath [R06.02] COPD exacerbation ([place]) [J44.1] Acute UTI [N39.0] Anemia, unspecified type [D64.9] COVID-19 virus infection [U07.1] COVID-19 [U07.1]  [facility]/MWF/ Rt Permcath  End-stage renal disease with hypokalemia on dialysis.   Received HD yesterday Potassium down to 3.2 today, was 4.8 yesterday No additional dialysis required today Will continue MWF schedule while hospitalized  2. Anemia of chronic kidney disease Lab Results  Component Value Date   HGB 7.4 (L) 02/07/2021  Continue Epogen with HD  3. Secondary Hyperparathyroidism:  Lab Results  Component Value Date   CALCIUM 7.6 (L) 02/07/2021   PHOS 7.2 (H) 01/12/2021   Will continue monitoring bone mineral metabolism parameters     LOS: 2 Melvenia Favela 12/10/202210:14 AM

## 2021-02-07 NOTE — Progress Notes (Signed)
SATURATION QUALIFICATIONS: (This note is used to comply with regulatory documentation for home oxygen)  Patient Saturations on Room Air at Rest = 98%  Patient Saturations on Room Air while Ambulating/marching in place= 85%  Patient Saturations on 2 Liters of oxygen while Ambulating = 99%  Please briefly explain why patient needs home oxygen:Unable to ambulate without requiring 02

## 2021-02-08 LAB — CBC
HCT: 20.9 % — ABNORMAL LOW (ref 39.0–52.0)
Hemoglobin: 6.7 g/dL — ABNORMAL LOW (ref 13.0–17.0)
MCH: 28.6 pg (ref 26.0–34.0)
MCHC: 32.1 g/dL (ref 30.0–36.0)
MCV: 89.3 fL (ref 80.0–100.0)
Platelets: 130 10*3/uL — ABNORMAL LOW (ref 150–400)
RBC: 2.34 MIL/uL — ABNORMAL LOW (ref 4.22–5.81)
RDW: 17.2 % — ABNORMAL HIGH (ref 11.5–15.5)
WBC: 7.4 10*3/uL (ref 4.0–10.5)
nRBC: 0 % (ref 0.0–0.2)

## 2021-02-08 LAB — BASIC METABOLIC PANEL
Anion gap: 9 (ref 5–15)
BUN: 39 mg/dL — ABNORMAL HIGH (ref 8–23)
CO2: 30 mmol/L (ref 22–32)
Calcium: 7.7 mg/dL — ABNORMAL LOW (ref 8.9–10.3)
Chloride: 97 mmol/L — ABNORMAL LOW (ref 98–111)
Creatinine, Ser: 3.95 mg/dL — ABNORMAL HIGH (ref 0.61–1.24)
GFR, Estimated: 14 mL/min — ABNORMAL LOW (ref >60)
Glucose, Bld: 93 mg/dL (ref 70–99)
Potassium: 4.1 mmol/L (ref 3.5–5.1)
Sodium: 136 mmol/L (ref 135–145)

## 2021-02-08 LAB — MAGNESIUM: Magnesium: 1.6 mg/dL — ABNORMAL LOW (ref 1.7–2.4)

## 2021-02-08 LAB — ABO/RH: ABO/RH(D): B POS

## 2021-02-08 MED ORDER — FOSFOMYCIN TROMETHAMINE 3 G PO PACK
3.0000 g | PACK | Freq: Once | ORAL | Status: AC
  Administered 2021-02-08: 3 g via ORAL
  Filled 2021-02-08: qty 3

## 2021-02-08 MED ORDER — SODIUM CHLORIDE 0.9% IV SOLUTION: Freq: Once | INTRAVENOUS | Status: DC

## 2021-02-08 MED ORDER — MAGNESIUM SULFATE 2 GM/50ML IV SOLN
2.0000 g | Freq: Once | INTRAVENOUS | Status: AC
  Administered 2021-02-08: 12:00:00 2 g via INTRAVENOUS
  Filled 2021-02-08: qty 50

## 2021-02-08 MED ORDER — PANTOPRAZOLE SODIUM 40 MG PO TBEC
40.0000 mg | DELAYED_RELEASE_TABLET | Freq: Every day | ORAL | Status: DC
  Administered 2021-02-08: 20:00:00 40 mg via ORAL
  Filled 2021-02-08: qty 1

## 2021-02-08 NOTE — Progress Notes (Signed)
Entered patient room to discuss consent for blood transfusion. Patient educated on reason for MD order for blood transfusion given his declining hemoglobin. Patient and wife both verbalized understanding. Patient stated he had never had a blood transfusion and was apprehensive to do so. Consent, including possible risks associated with transfusion, reviewed. Patient then given risk associated with not receiving transfusion. Patient stated he wished he could have it done during dialysis. Patient educated that sometimes that does, in fact, occur but it would be at MD discretion. Patient also asking about just receiving iron instead of transfusion. MD notified, who will speak with both patient and patient's wife on rounds. Consent signing deferred.

## 2021-02-08 NOTE — Progress Notes (Signed)
Patient declined scheduled breathing treatment. Patient states the treatments make him feel sick and uneasy. Family members at bedside. Patient has clear bilateral breath sounds, adequate SAT on 2L, normal respirations and no distress at this time.

## 2021-02-08 NOTE — Progress Notes (Addendum)
PROGRESS NOTE    Phillip Chavez  ZSW:[id number] DOB: 1940-10-10 DOA: 02/05/2021 PCP: [facility]  101A/101A-AA   Assessment & Plan:   Principal Problem:   Acute respiratory failure with hypoxia ([place]) Active Problems:   COPD (chronic obstructive pulmonary disease) (HCC)   Elevated troponin   Aortic valve regurgitation   COVID-19 virus infection   Bacteremia due to Enterococcus   Acute on chronic anemia   ESRD on hemodialysis ([place])   Pressure injury of skin   Phillip Chavez is a 80 y.o. male with medical history significant for HTN, CAD, COPD, ESRD recently started on HD MWF via tunneled catheter right chest, recently hospitalized from 11/5-11/26 with respiratory failure of multifactorial etiology including COVID, bacterial pneumonia and fluid overload from severe aortic regurg who presented from SNF to the ER for evaluation of shortness of breath.   During his recent stay he was also diagnosed with sepsis secondary to Enterococcus bacteremia and had completed abx course prior to discharge.  Patient was not needing O2 at rest prior to his recent discharge.  On the day of arrival patient reports that he was having trouble catching his breath when he woke up in the middle of the night.  EMS reported a temperature of 100.4 and he arrived on 2 L oxygen   Respiratory distress with chronic hypoxia Likely OSA --Pt reported waking up feeling like he couldn't catch his breath, likely an episode of anxiety or sleep apnea.   --In the ED, pt was noted to desat while sleeping, so likely has sleep apnea, but while awake, was able to maintain 98% on room air.   --CXR showed no acute finding, and actually improved aeration in the right lung base is noted when compare with the prior study.  Both procal and BNP improved from prior hospitalization as well.  Abx d/c'ed. --Pt was found to desat down to 85% with walking, so needs 2L with ambulation. Plan: --Continue supplemental O2 to keep  sats between 88-92% --Will discharge on 2L O2  Reported fever --temp 100.4 noted per EMS, no fever documented since presentation.  No leukocytosis, procal improved from prior. --received vanc/meropenem x1 on presentation, abx since held. Plan: --f/u blood cx  Presumed UTI from ESBL Kleb (not CAUTI) --urine cx was obtained by SNF shortly before hospital presentation, presumably obtained at the time of initial Foley insertion.  SNF had ordered Ertapenem IM for empiric tx for UTI, but pt refused.  SNF reported urine cx later grew ESBL kleb.  Another urine cx obtained in the ED after presentation off of the same Foley also grew the same. --after discussion with ID Dr. Delaine Lame, will treat with Ertapenem x1 (02/06/21) followed by one dose of fosfomycin in 48 hrs  Plan: --fosfomycin x1 tonight  Urinary retention with Foley on presentation, resolved --Per SNF, pt had no urine output for about a week (pt is dialysis pt), and complained of abdominal pain.  Foley inserted with 1L urine return at SNF.    Abdominal pain resolved after bladder decompression, and pt denied dysuria. --Foley d/c'ed after presentation.  Pt has been able to void on his own since, although small amount Plan: --Recommend I/O cath every 2 days if pt couldn't void on his own --cont flomax (new)  Recent COVID-19 virus infection, not currently active - COVID-positive on 11/12 -S/p remdesivir in November 2022 --no need for repeat tx     Elevated troponin - Suspect demand ischemia - Troponin 114, and trended down  to 95   Recent Bacteremia due to Enterococcus - already completed treatment prior to last discharge.  The Invanz ordered at the facility was for empiric tx of UTI, and pt reportedly declined.     Acute on chronic anemia - Hgb gradually trending down, likely due to chronic disease and hospital blood draws.  No signs of bleeding. --EPO with dialysis --1u pRBC for Hgb 6.7 today, pt wants to receive it with  dialysis tomorrow     COPD (chronic obstructive pulmonary disease) (HCC) -cont DuoNeb TID     Aortic valve regurgitation, severe - Last echo was on 11/5 with severe aortic regurg     ESRD on hemodialysis ([place]) - iHD per nephrology  GERD --start PPI daily, per pt request   DVT prophylaxis: Heparin SQ Code Status: Full code  Family Communication: wife updated at bedside today  Level of care: Med-Surg Dispo:   The patient is from: SNF Anticipated d/c is to: SNF Anticipated d/c date is: Monday (that's when SNF can receive pt)  Patient currently is medically ready to d/c.   Subjective and Interval History:  Pt reported breathing not what he would like it to be.  Able to void on his own, small amounts.  Mental status appeared a lot better.  Pt requested his blood transfusion to be done with dialysis.   Objective: Vitals:   02/08/21 0850 02/08/21 1000 02/08/21 1113 02/08/21 1327  BP: (!) 132/37 (!) 130/48 (!) 128/41   Pulse: 98  96   Resp: 18  16   Temp: 98 F (36.7 C)  98.2 F (36.8 C)   TempSrc: Oral  Oral   SpO2: 97%  98% 96%  Weight:      Height:        Intake/Output Summary (Last 24 hours) at 02/08/2021 1526 Last data filed at 02/08/2021 1403 Gross per 24 hour  Intake 360 ml  Output --  Net 360 ml   Filed Weights   02/05/21 0111 02/06/21 1532 02/06/21 1905  Weight: 63.5 kg 72.8 kg 73.3 kg    Examination:   Constitutional: NAD, AAOx3 HEENT: conjunctivae and lids normal, EOMI CV: No cyanosis.   RESP: normal respiratory effort, on 2L Extremities: No effusions, edema in BLE SKIN: warm, dry Neuro: II - XII grossly intact.   Psych: better mood and affect.  Appropriate judgement and reason    Data Reviewed: I have personally reviewed following labs and imaging studies  CBC: Recent Labs  Lab 02/05/21 0130 02/06/21 0455 02/07/21 0524 02/08/21 0427  WBC 6.6 8.0 10.5 7.4  NEUTROABS 4.0  --   --   --   HGB 7.6* 7.6* 7.4* 6.7*  HCT 23.8* 23.6*  23.3* 20.9*  MCV 89.8 90.4 88.3 89.3  PLT 145* 151 160 716*   Basic Metabolic Panel: Recent Labs  Lab 02/05/21 0130 02/05/21 0342 02/06/21 0455 02/07/21 0524 02/08/21 0427  NA 135  --  136 137 136  K 3.0*  --  4.8 3.2* 4.1  CL 98  --  101 98 97*  CO2 27  --  26 29 30   GLUCOSE 90  --  152* 91 93  BUN 12  --  39* 24* 39*  CREATININE 3.13*  --  5.01* 3.02* 3.95*  CALCIUM 7.6*  --  8.0* 7.6* 7.7*  MG  --  1.6* 1.9 1.6* 1.6*   GFR: Estimated Creatinine Clearance: 12.5 mL/min (A) (by C-G formula based on SCr of 3.95 mg/dL (H)). Liver Function Tests: Recent  Labs  Lab 02/05/21 0130  AST 66*  ALT 112*  ALKPHOS 94  BILITOT 0.7  PROT 6.1*  ALBUMIN 2.0*   No results for input(s): LIPASE, AMYLASE in the last 168 hours. No results for input(s): AMMONIA in the last 168 hours. Coagulation Profile: No results for input(s): INR, PROTIME in the last 168 hours. Cardiac Enzymes: No results for input(s): CKTOTAL, CKMB, CKMBINDEX, TROPONINI in the last 168 hours. BNP (last 3 results) No results for input(s): PROBNP in the last 8760 hours. HbA1C: No results for input(s): HGBA1C in the last 72 hours. CBG: No results for input(s): GLUCAP in the last 168 hours. Lipid Profile: No results for input(s): CHOL, HDL, LDLCALC, TRIG, CHOLHDL, LDLDIRECT in the last 72 hours. Thyroid Function Tests: No results for input(s): TSH, T4TOTAL, FREET4, T3FREE, THYROIDAB in the last 72 hours. Anemia Panel: No results for input(s): VITAMINB12, FOLATE, FERRITIN, TIBC, IRON, RETICCTPCT in the last 72 hours.  Sepsis Labs: Recent Labs  Lab 02/05/21 0130  PROCALCITON 0.32  LATICACIDVEN 1.3    Recent Results (from the past 240 hour(s))  Urine Culture     Status: Abnormal   Collection Time: 02/05/21  1:30 AM   Specimen: Urine, Random  Result Value Ref Range Status   Specimen Description   Final    URINE, RANDOM Performed at [facility], [address], [place], [place] [address]     Special Requests   Final    NONE Performed at [facility], [address] [place], [place] [address]    Culture (A)  Final    >=100,000 COLONIES/mL KLEBSIELLA PNEUMONIAE Confirmed Extended Spectrum Beta-Lactamase Producer (ESBL).  In bloodstream infections from ESBL organisms, carbapenems are preferred over piperacillin/tazobactam. They are shown to have a lower risk of mortality.    Report Status 02/07/2021 FINAL  Final   Organism ID, Bacteria KLEBSIELLA PNEUMONIAE (A)  Final      Susceptibility   Klebsiella pneumoniae - MIC*    AMPICILLIN >=32 RESISTANT Resistant     CEFAZOLIN >=64 RESISTANT Resistant     CEFEPIME >=32 RESISTANT Resistant     CEFTRIAXONE >=64 RESISTANT Resistant     CIPROFLOXACIN 0.5 INTERMEDIATE Intermediate     GENTAMICIN <=1 SENSITIVE Sensitive     IMIPENEM <=0.25 SENSITIVE Sensitive     NITROFURANTOIN 64 INTERMEDIATE Intermediate     TRIMETH/SULFA >=320 RESISTANT Resistant     AMPICILLIN/SULBACTAM 16 INTERMEDIATE Intermediate     PIP/TAZO <=4 SENSITIVE Sensitive     * >=100,000 COLONIES/mL KLEBSIELLA PNEUMONIAE  Culture, blood (Routine X 2) w Reflex to ID Panel     Status: None (Preliminary result)   Collection Time: 02/05/21  1:30 AM   Specimen: BLOOD  Result Value Ref Range Status   Specimen Description BLOOD LEFT ASSIST CONTROL  Final   Special Requests   Final    BOTTLES DRAWN AEROBIC AND ANAEROBIC Blood Culture adequate volume   Culture   Final    NO GROWTH 3 DAYS Performed at [facility], [address] [place],  [address]    Report Status PENDING  Incomplete  Culture, blood (Routine X 2) w Reflex to ID Panel     Status: None (Preliminary result)   Collection Time: 02/05/21  1:30 AM   Specimen: BLOOD  Result Value Ref Range Status   Specimen Description BLOOD RIGHT ASSIST CONTROL  Final   Special Requests   Final    BOTTLES DRAWN AEROBIC AND ANAEROBIC Blood Culture adequate volume   Culture  Final    NO GROWTH 3  DAYS Performed at [facility], [address] [place], [place] [address]    Report Status PENDING  Incomplete  Resp Panel by RT-PCR (Flu A&B, Covid) Urine, Catheterized     Status: Abnormal   Collection Time: 02/05/21  1:30 AM   Specimen: Urine, Catheterized; Nasopharyngeal(NP) swabs in vial transport medium  Result Value Ref Range Status   SARS Coronavirus 2 by RT PCR POSITIVE (A) NEGATIVE Final    Comment: RESULT CALLED TO, READ BACK BY AND VERIFIED WITH: AUSTIN REEVES@[id number]  02/05/21 RH (NOTE) SARS-CoV-2 target nucleic acids are DETECTED.  The SARS-CoV-2 RNA is generally detectable in upper respiratory specimens during the acute phase of infection. Positive results are indicative of the presence of the identified virus, but do not rule out bacterial infection or co-infection with other pathogens not detected by the test. Clinical correlation with patient history and other diagnostic information is necessary to determine patient infection status. The expected result is Negative.  Fact Sheet for Patients: EntrepreneurPulse.com.au  Fact Sheet for Healthcare Providers: IncredibleEmployment.be  This test is not yet approved or cleared by the [place] FDA and  has been authorized for detection and/or diagnosis of SARS-CoV-2 by FDA under an Emergency Use Authorization (EUA).  This EUA will remain in effect (meaning this test can be use d) for the duration of  the COVID-19 declaration under Section 564(b)(1) of the Act, 21 U.S.C. section 360bbb-3(b)(1), unless the authorization is terminated or revoked sooner.     Influenza A by PCR NEGATIVE NEGATIVE Final   Influenza B by PCR NEGATIVE NEGATIVE Final    Comment: (NOTE) The Xpert Xpress SARS-CoV-2/FLU/RSV plus assay is intended as an aid in the diagnosis of influenza from Nasopharyngeal swab specimens and should not be used as a sole basis for treatment. Nasal washings  and aspirates are unacceptable for Xpert Xpress SARS-CoV-2/FLU/RSV testing.  Fact Sheet for Patients: EntrepreneurPulse.com.au  Fact Sheet for Healthcare Providers: IncredibleEmployment.be  This test is not yet approved or cleared by the [place] FDA and has been authorized for detection and/or diagnosis of SARS-CoV-2 by FDA under an Emergency Use Authorization (EUA). This EUA will remain in effect (meaning this test can be used) for the duration of the COVID-19 declaration under Section 564(b)(1) of the Act, 21 U.S.C. section 360bbb-3(b)(1), unless the authorization is terminated or revoked.  Performed at [facility], [place], [place] [address]   Respiratory (~20 pathogens) panel by PCR     Status: None   Collection Time: 02/07/21 11:10 AM   Specimen: Nasopharyngeal Swab; Respiratory  Result Value Ref Range Status   Adenovirus NOT DETECTED NOT DETECTED Final   Coronavirus 229E NOT DETECTED NOT DETECTED Final    Comment: (NOTE) The Coronavirus on the Respiratory Panel, DOES NOT test for the novel  Coronavirus (2019 nCoV)    Coronavirus HKU1 NOT DETECTED NOT DETECTED Final   Coronavirus NL63 NOT DETECTED NOT DETECTED Final   Coronavirus OC43 NOT DETECTED NOT DETECTED Final   Metapneumovirus NOT DETECTED NOT DETECTED Final   Rhinovirus / Enterovirus NOT DETECTED NOT DETECTED Final   Influenza A NOT DETECTED NOT DETECTED Final   Influenza B NOT DETECTED NOT DETECTED Final   Parainfluenza Virus 1 NOT DETECTED NOT DETECTED Final   Parainfluenza Virus 2 NOT DETECTED NOT DETECTED Final   Parainfluenza Virus 3 NOT DETECTED NOT DETECTED Final   Parainfluenza Virus 4 NOT DETECTED NOT DETECTED Final   Respiratory Syncytial Virus  NOT DETECTED NOT DETECTED Final   Bordetella pertussis NOT DETECTED NOT DETECTED Final   Bordetella Parapertussis NOT DETECTED NOT DETECTED Final   Chlamydophila pneumoniae NOT DETECTED NOT  DETECTED Final   Mycoplasma pneumoniae NOT DETECTED NOT DETECTED Final    Comment: Performed at [facility], [place] [address] [place], [place] [address]      Radiology Studies: No results found.   Scheduled Meds:  (feeding supplement) PROSource Plus  30 mL Oral TID BM   sodium chloride   Intravenous Once   Chlorhexidine Gluconate Cloth  6 each Topical Daily   cholecalciferol  1,000 Units Oral Daily   dronabinol  5 mg Oral BID AC   [START ON 02/09/2021] epoetin (EPOGEN/PROCRIT) injection  10,000 Units Intravenous Q M,W,F-HD   feeding supplement (NEPRO CARB STEADY)  237 mL Oral BID BM   heparin  5,000 Units Subcutaneous Q8H   ipratropium-albuterol  3 mL Nebulization TID   multivitamin  1 tablet Oral QHS   mupirocin ointment   Topical BID   omega-3 acid ethyl esters  3 g Oral BID   tamsulosin  0.4 mg Oral Daily   Continuous Infusions:  sodium chloride     sodium chloride       LOS: 3 days    Enzo Bi, MD [facility] If 7PM-7AM, please contact night-coverage 02/08/2021, 3:26 PM

## 2021-02-08 NOTE — Progress Notes (Addendum)
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  Patient reports to the emergency department with complaints of shortness of breath.  He is admitted for Shortness of breath [R06.02] COPD exacerbation ([place]) [J44.1] Acute UTI [N39.0] Anemia, unspecified type [D64.9] COVID-19 virus infection [U07.1] COVID-19 [U07.1]  Patient is known to our clinic from past hospitalization and was recently started on hemodialysis during previous admission. Patient currently receives outpatient treatments at [facility] on a MWF schedule.   Update Patient resting in bed, wife at the bedside. He appears in no acute distress.No c/o SOB, anorexia,nausea or vomiting, continuous to be on 2L supplemental O2 via [place].   Objective:  Vital signs in last 24 hours:  Temp:  [97.8 F (36.6 C)-98.6 F (37 C)] 98.2 F (36.8 C) (12/11 1113) Pulse Rate:  [90-101] 96 (12/11 1113) Resp:  [16-20] 16 (12/11 1113) BP: (116-132)/(37-58) 128/41 (12/11 1113) SpO2:  [95 %-99 %] 96 % (12/11 1327)  Weight change:  Filed Weights   02/05/21 0111 02/06/21 1532 02/06/21 1905  Weight: 63.5 kg 72.8 kg 73.3 kg    Intake/Output: I/O last 3 completed shifts: In: 120 [P.O.:120] Out: -    Intake/Output this shift:  Total I/O In: 360 [P.O.:360] Out: -   Physical Exam: General: Resting in bed, awake, alert  Head: Moist oral mucosal membranes  Eyes: Anicteric  Lungs:  Lungs clear bilaterally, respiration even ,unlabored on 2L supplemental O2  Heart: S1S2, no rubs or gallops  Abdomen:  Soft, nontender  Extremities: No peripheral edema.  Neurologic: Able to answer simple questions appropriately  Skin: No acute lesions or rashes  Access: Right IJ PermCath    Basic Metabolic Panel: Recent Labs  Lab 02/05/21 0130 02/05/21 0342 02/06/21 0455  02/07/21 0524 02/08/21 0427  NA 135  --  136 137 136  K 3.0*  --  4.8 3.2* 4.1  CL 98  --  101 98 97*  CO2 27  --  26 29 30   GLUCOSE 90  --  152* 91 93  BUN 12  --  39* 24* 39*  CREATININE 3.13*  --  5.01* 3.02* 3.95*  CALCIUM 7.6*  --  8.0* 7.6* 7.7*  MG  --  1.6* 1.9 1.6* 1.6*     Liver Function Tests: Recent Labs  Lab 02/05/21 0130  AST 66*  ALT 112*  ALKPHOS 94  BILITOT 0.7  PROT 6.1*  ALBUMIN 2.0*    No results for input(s): LIPASE, AMYLASE in the last 168 hours. No results for input(s): AMMONIA in the last 168 hours.  CBC: Recent Labs  Lab 02/05/21 0130 02/06/21 0455 02/07/21 0524 02/08/21 0427  WBC 6.6 8.0 10.5 7.4  NEUTROABS 4.0  --   --   --   HGB 7.6* 7.6* 7.4* 6.7*  HCT 23.8* 23.6* 23.3* 20.9*  MCV 89.8 90.4 88.3 89.3  PLT 145* 151 160 130*     Cardiac Enzymes: No results for input(s): CKTOTAL, CKMB, CKMBINDEX, TROPONINI in the last 168 hours.  BNP: Invalid input(s): POCBNP  CBG: No results for input(s): GLUCAP in the last 168 hours.  Microbiology: Results for orders placed or performed during the hospital encounter of 02/05/21  Urine Culture     Status: Abnormal   Collection Time: 02/05/21  1:30 AM   Specimen: Urine, Random  Result Value Ref Range Status  Specimen Description   Final    URINE, RANDOM Performed at [facility], [address], [place], [place] [address]    Special Requests   Final    NONE Performed at [facility], [address] [place], [place] [address]    Culture (A)  Final    >=100,000 COLONIES/mL KLEBSIELLA PNEUMONIAE Confirmed Extended Spectrum Beta-Lactamase Producer (ESBL).  In bloodstream infections from ESBL organisms, carbapenems are preferred over piperacillin/tazobactam. They are shown to have a lower risk of mortality.    Report Status 02/07/2021 FINAL  Final   Organism ID, Bacteria KLEBSIELLA PNEUMONIAE (A)  Final      Susceptibility   Klebsiella pneumoniae - MIC*     AMPICILLIN >=32 RESISTANT Resistant     CEFAZOLIN >=64 RESISTANT Resistant     CEFEPIME >=32 RESISTANT Resistant     CEFTRIAXONE >=64 RESISTANT Resistant     CIPROFLOXACIN 0.5 INTERMEDIATE Intermediate     GENTAMICIN <=1 SENSITIVE Sensitive     IMIPENEM <=0.25 SENSITIVE Sensitive     NITROFURANTOIN 64 INTERMEDIATE Intermediate     TRIMETH/SULFA >=320 RESISTANT Resistant     AMPICILLIN/SULBACTAM 16 INTERMEDIATE Intermediate     PIP/TAZO <=4 SENSITIVE Sensitive     * >=100,000 COLONIES/mL KLEBSIELLA PNEUMONIAE  Culture, blood (Routine X 2) w Reflex to ID Panel     Status: None (Preliminary result)   Collection Time: 02/05/21  1:30 AM   Specimen: BLOOD  Result Value Ref Range Status   Specimen Description BLOOD LEFT ASSIST CONTROL  Final   Special Requests   Final    BOTTLES DRAWN AEROBIC AND ANAEROBIC Blood Culture adequate volume   Culture   Final    NO GROWTH 3 DAYS Performed at [facility], [address] [place], [place] [address]    Report Status PENDING  Incomplete  Culture, blood (Routine X 2) w Reflex to ID Panel     Status: None (Preliminary result)   Collection Time: 02/05/21  1:30 AM   Specimen: BLOOD  Result Value Ref Range Status   Specimen Description BLOOD RIGHT ASSIST CONTROL  Final   Special Requests   Final    BOTTLES DRAWN AEROBIC AND ANAEROBIC Blood Culture adequate volume   Culture   Final    NO GROWTH 3 DAYS Performed at [facility], [address] [place], [place] [address]    Report Status PENDING  Incomplete  Resp Panel by RT-PCR (Flu A&B, Covid) Urine, Catheterized     Status: Abnormal   Collection Time: 02/05/21  1:30 AM   Specimen: Urine, Catheterized; Nasopharyngeal(NP) swabs in vial transport medium  Result Value Ref Range Status   SARS Coronavirus 2 by RT PCR POSITIVE (A) NEGATIVE Final    Comment: RESULT CALLED TO, READ BACK BY AND VERIFIED WITH: AUSTIN REEVES@[id number]  02/05/21 RH (NOTE) SARS-CoV-2 target nucleic acids are  DETECTED.  The SARS-CoV-2 RNA is generally detectable in upper respiratory specimens during the acute phase of infection. Positive results are indicative of the presence of the identified virus, but do not rule out bacterial infection or co-infection with other pathogens not detected by the test. Clinical correlation with patient history and other diagnostic information is necessary to determine patient infection status. The expected result is Negative.  Fact Sheet for Patients: EntrepreneurPulse.com.au  Fact Sheet for Healthcare Providers: IncredibleEmployment.be  This test is not yet approved or cleared by the [place] FDA and  has been authorized for detection and/or diagnosis of SARS-CoV-2 by FDA under an  Emergency Use Authorization (EUA).  This EUA will remain in effect (meaning this test can be use d) for the duration of  the COVID-19 declaration under Section 564(b)(1) of the Act, 21 U.S.C. section 360bbb-3(b)(1), unless the authorization is terminated or revoked sooner.     Influenza A by PCR NEGATIVE NEGATIVE Final   Influenza B by PCR NEGATIVE NEGATIVE Final    Comment: (NOTE) The Xpert Xpress SARS-CoV-2/FLU/RSV plus assay is intended as an aid in the diagnosis of influenza from Nasopharyngeal swab specimens and should not be used as a sole basis for treatment. Nasal washings and aspirates are unacceptable for Xpert Xpress SARS-CoV-2/FLU/RSV testing.  Fact Sheet for Patients: EntrepreneurPulse.com.au  Fact Sheet for Healthcare Providers: IncredibleEmployment.be  This test is not yet approved or cleared by the [place] FDA and has been authorized for detection and/or diagnosis of SARS-CoV-2 by FDA under an Emergency Use Authorization (EUA). This EUA will remain in effect (meaning this test can be used) for the duration of the COVID-19 declaration under Section 564(b)(1) of the Act, 21  U.S.C. section 360bbb-3(b)(1), unless the authorization is terminated or revoked.  Performed at [facility], [place], [place] [address]   Respiratory (~20 pathogens) panel by PCR     Status: None   Collection Time: 02/07/21 11:10 AM   Specimen: Nasopharyngeal Swab; Respiratory  Result Value Ref Range Status   Adenovirus NOT DETECTED NOT DETECTED Final   Coronavirus 229E NOT DETECTED NOT DETECTED Final    Comment: (NOTE) The Coronavirus on the Respiratory Panel, DOES NOT test for the novel  Coronavirus (2019 nCoV)    Coronavirus HKU1 NOT DETECTED NOT DETECTED Final   Coronavirus NL63 NOT DETECTED NOT DETECTED Final   Coronavirus OC43 NOT DETECTED NOT DETECTED Final   Metapneumovirus NOT DETECTED NOT DETECTED Final   Rhinovirus / Enterovirus NOT DETECTED NOT DETECTED Final   Influenza A NOT DETECTED NOT DETECTED Final   Influenza B NOT DETECTED NOT DETECTED Final   Parainfluenza Virus 1 NOT DETECTED NOT DETECTED Final   Parainfluenza Virus 2 NOT DETECTED NOT DETECTED Final   Parainfluenza Virus 3 NOT DETECTED NOT DETECTED Final   Parainfluenza Virus 4 NOT DETECTED NOT DETECTED Final   Respiratory Syncytial Virus NOT DETECTED NOT DETECTED Final   Bordetella pertussis NOT DETECTED NOT DETECTED Final   Bordetella Parapertussis NOT DETECTED NOT DETECTED Final   Chlamydophila pneumoniae NOT DETECTED NOT DETECTED Final   Mycoplasma pneumoniae NOT DETECTED NOT DETECTED Final    Comment: Performed at [facility], [place]. [address] [place], [place] [address]    Coagulation Studies: No results for input(s): LABPROT, INR in the last 72 hours.  Urinalysis: No results for input(s): COLORURINE, LABSPEC, PHURINE, GLUCOSEU, HGBUR, BILIRUBINUR, KETONESUR, PROTEINUR, UROBILINOGEN, NITRITE, LEUKOCYTESUR in the last 72 hours.  Invalid input(s): APPERANCEUR     Imaging: No results found.   Medications:    sodium chloride     sodium chloride        (feeding supplement) PROSource Plus  30 mL Oral TID BM   sodium chloride   Intravenous Once   Chlorhexidine Gluconate Cloth  6 each Topical Daily   cholecalciferol  1,000 Units Oral Daily   dronabinol  5 mg Oral BID AC   [START ON 02/09/2021] epoetin (EPOGEN/PROCRIT) injection  10,000 Units Intravenous Q M,W,F-HD   feeding supplement (NEPRO CARB STEADY)  237 mL Oral BID BM   heparin  5,000 Units Subcutaneous Q8H   ipratropium-albuterol  3  mL Nebulization TID   multivitamin  1 tablet Oral QHS   mupirocin ointment   Topical BID   omega-3 acid ethyl esters  3 g Oral BID   tamsulosin  0.4 mg Oral Daily   sodium chloride, sodium chloride, acetaminophen **OR** acetaminophen, alteplase, heparin, lidocaine (PF), lidocaine-prilocaine, ondansetron **OR** ondansetron (ZOFRAN) IV, pentafluoroprop-tetrafluoroeth  Assessment/ Plan:  Phillip Chavez is a 80 y.o.  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  Patient reports to the emergency department with complaints of shortness of breath.  He is admitted for Shortness of breath [R06.02] COPD exacerbation ([place]) [J44.1] Acute UTI [N39.0] Anemia, unspecified type [D64.9] COVID-19 virus infection [U07.1] COVID-19 [U07.1]  [facility]/MWF/ Rt Permcath  End-stage renal disease with hypokalemia on dialysis.   Received HD on Friday Volume and electrolyte status acceptable No additional dialysis required  Will  plan for HD tomorrow  2. Anemia of chronic kidney disease Lab Results  Component Value Date   HGB 6.7 (L) 02/08/2021  Continue Epogen with HD Primary team discussed about blood transfusion, initially hesitant, now reconsidering it.   3. Secondary Hyperparathyroidism:  Lab Results  Component Value Date   CALCIUM 7.7 (L) 02/08/2021   PHOS 7.2 (H) 01/12/2021   Will continue monitoring bone mineral metabolism parameters     LOS: 3 Audris Speaker 12/11/20222:32 PM

## 2021-02-08 NOTE — TOC Progression Note (Addendum)
Transition of Care [facility]) - Progression Note    Patient Details  Name: Phillip Chavez MRN: [id number] Date of Birth: 01/10/1941  Transition [facility] CM/SW [place], LCSW Phone Number: 02/08/2021, 2:04 PM  Clinical Narrative:   Plan for DC to [facility] tomorrow 12/12. Confirmed with Tammy at [facility] and family on 12/11. Tammy is aware patient is requesting a private room, she should have one available.  2:25- Call from son. Provided update. He asked if patient can get dialysis prior to going to [facility] tomorrow.     Barriers to Discharge: Continued Medical Work up  Expected Discharge Plan and Services         Living arrangements for the past 2 months: [place] (SDOH) Interventions    Readmission Risk Interventions Readmission Risk Prevention Plan 02/06/2021  Transportation Screening Complete  PCP or Specialist Appt within 3-5 Days Complete  HRI or [place] Complete  Social Work Consult for [place] Planning/Counseling Complete  Palliative Care Screening Not Applicable  Medication Review Press photographer) Complete  Some recent data might be hidden

## 2021-02-08 NOTE — Progress Notes (Signed)
Type and Screen completed by lab and verified. Awaiting results for transfusion.

## 2021-02-09 LAB — BASIC METABOLIC PANEL
Anion gap: 8 (ref 5–15)
BUN: 43 mg/dL — ABNORMAL HIGH (ref 8–23)
CO2: 28 mmol/L (ref 22–32)
Calcium: 7.6 mg/dL — ABNORMAL LOW (ref 8.9–10.3)
Chloride: 96 mmol/L — ABNORMAL LOW (ref 98–111)
Creatinine, Ser: 4.63 mg/dL — ABNORMAL HIGH (ref 0.61–1.24)
GFR, Estimated: 12 mL/min — ABNORMAL LOW (ref >60)
Glucose, Bld: 107 mg/dL — ABNORMAL HIGH (ref 70–99)
Potassium: 3.9 mmol/L (ref 3.5–5.1)
Sodium: 132 mmol/L — ABNORMAL LOW (ref 135–145)

## 2021-02-09 LAB — CBC
HCT: 22.7 % — ABNORMAL LOW (ref 39.0–52.0)
Hemoglobin: 7.3 g/dL — ABNORMAL LOW (ref 13.0–17.0)
MCH: 28.5 pg (ref 26.0–34.0)
MCHC: 32.2 g/dL (ref 30.0–36.0)
MCV: 88.7 fL (ref 80.0–100.0)
Platelets: 129 10*3/uL — ABNORMAL LOW (ref 150–400)
RBC: 2.56 MIL/uL — ABNORMAL LOW (ref 4.22–5.81)
RDW: 17 % — ABNORMAL HIGH (ref 11.5–15.5)
WBC: 9 10*3/uL (ref 4.0–10.5)
nRBC: 0 % (ref 0.0–0.2)

## 2021-02-09 LAB — MAGNESIUM: Magnesium: 1.6 mg/dL — ABNORMAL LOW (ref 1.7–2.4)

## 2021-02-09 MED ORDER — EPOETIN ALFA 10000 UNIT/ML IJ SOLN: 10000.0000 [IU] | INTRAMUSCULAR | Status: AC

## 2021-02-09 MED ORDER — TAMSULOSIN HCL 0.4 MG PO CAPS
0.4000 mg | ORAL_CAPSULE | Freq: Every day | ORAL | Status: AC
Start: 2021-02-09 — End: ?

## 2021-02-09 MED ORDER — MAGNESIUM SULFATE 2 GM/50ML IV SOLN: 2.0000 g | Freq: Once | INTRAVENOUS | Status: DC

## 2021-02-09 MED ORDER — FLUCONAZOLE 100 MG PO TABS: 100.0000 mg | ORAL_TABLET | Freq: Every day | ORAL | 0 refills | Status: AC

## 2021-02-09 MED ORDER — FLUTICASONE FUROATE-VILANTEROL 200-25 MCG/ACT IN AEPB: 1.0000 | INHALATION_SPRAY | Freq: Every day | RESPIRATORY_TRACT | Status: AC

## 2021-02-09 MED ORDER — HEPARIN SODIUM (PORCINE) 1000 UNIT/ML IJ SOLN
INTRAMUSCULAR | Status: AC
  Administered 2021-02-09: 4200 [IU]
  Filled 2021-02-09: qty 10

## 2021-02-09 MED ORDER — EPOETIN ALFA 4000 UNIT/ML IJ SOLN
INTRAMUSCULAR | Status: AC
  Filled 2021-02-09: qty 3

## 2021-02-09 MED ORDER — FLUCONAZOLE 100 MG PO TABS
100.0000 mg | ORAL_TABLET | Freq: Every day | ORAL | Status: DC
  Filled 2021-02-09: qty 1

## 2021-02-09 MED ORDER — IPRATROPIUM-ALBUTEROL 0.5-2.5 (3) MG/3ML IN SOLN
3.0000 mL | Freq: Two times a day (BID) | RESPIRATORY_TRACT | Status: DC
  Administered 2021-02-09: 3 mL via RESPIRATORY_TRACT

## 2021-02-09 NOTE — TOC Transition Note (Addendum)
Transition of Care [facility]) - CM/SW Discharge Note   Patient Details  Name: Phillip Chavez MRN: [id number] Date of Birth: [birth year]  [facility] CM/SW Contact:  Kerin Salen, RN Phone Number: 02/09/2021, 12:26 PM   Clinical Narrative:  To discharge to [facility] room 701 by [facility]. Nurse to call report. TOC barriers resolved.   2pm: AEMS cancelled Son will transport to [facility], Oxygen order faxed to [facility] [phone], called [facility] [phone] spoke to [place] informed her that order was faxed, she indicated that she spoke with Son and will contact me if she does not receive fax. Son also notified that the Oxygen order was faxed. TOC complete.    Final next level of care: Skilled Nursing Facility Barriers to Discharge: Barriers Resolved   Patient Goals and CMS Choice Patient states their goals for this hospitalization and ongoing recovery are:: To return to [facility]   Choice offered to / list presented to : Spouse  Discharge Placement              Patient chooses bed at: [facility] [place]   Name of family member notified: Wife Rosaletta Patient and family notified of of transfer: 02/09/21  Discharge Plan and Services                                     Social Determinants of Health ([place]) Interventions     Readmission Risk Interventions Readmission Risk Prevention Plan 02/06/2021  Transportation Screening Complete  PCP or Specialist Appt within 3-5 Days Complete  HRI or Home Care Consult Complete  Social Work Consult for [place] Planning/Counseling Complete  Palliative Care Screening Not Applicable  Medication Review Press photographer) Complete  Some recent data might be hidden

## 2021-02-09 NOTE — Discharge Summary (Signed)
Physician Discharge Summary   Phillip Chavez  male DOB: 1940/11/03  JDY:[id number]  PCP: [facility]  Admit date: 02/05/2021 Discharge date: 02/09/2021  Admitted From: SNF Disposition:  SNF Wife updated at bedside prior to discharge.  CODE STATUS: Full code  Discharge Instructions     No wound care   Complete by: As directed        Hospital Course:  For full details, please see H&P, progress notes, consult notes and ancillary notes.  Briefly,  Phillip Chavez is a 80 y.o. male with medical history significant for HTN, CAD, COPD, ESRD recently started on HD MWF via tunneled catheter right chest, recently hospitalized from 11/5-11/26 with respiratory failure of multifactorial etiology including COVID, bacterial pneumonia and fluid overload from severe aortic regurg who presented from SNF to the ER for evaluation of shortness of breath.    On the day of arrival patient reports that he was having trouble catching his breath when he woke up in the middle of the night.  EMS reported a temperature of 100.4 and he arrived on 2 L oxygen   Respiratory distress with chronic hypoxia Likely OSA --Pt reported waking up feeling like he couldn't catch his breath, likely an episode of anxiety or sleep apnea.   --In the ED, pt was noted to desat while sleeping, so likely has sleep apnea, but while awake, was able to maintain 98% on room air.   --CXR showed no acute finding, and actually improved aeration in the right lung base is noted when compare with the prior study.  Both procal and BNP improved from prior hospitalization as well.  RVP neg. --Pt was found to desat down to 85% with walking, so needs 2L with ambulation.  Pt was discharged on 2L O2.  Reported fever --temp 100.4 noted per EMS, however, no fever documented since presentation.  No leukocytosis, procal improved from prior hospitalization. --received vanc/meropenem x1 on presentation.   --blood cx neg for 4 days  prior to discharge.   Presumed UTI from ESBL Kleb (not CAUTI) --urine cx was obtained by SNF shortly before hospital presentation, presumably obtained at the time of initial Foley insertion.  SNF had ordered Ertapenem IM for empiric tx for UTI, but pt refused.  SNF reported urine cx later grew ESBL kleb.  Another urine cx obtained in the ED after presentation off of the same Foley also grew the same. --Pt received empiric Meropemen on presentation.  After discussion with ID Dr. Delaine Lame, pt was treated with Ertapenem x1 (02/06/21) followed by one dose of fosfomycin in 48 hrs later on 12/11, and this completed the course of tx for his UTI. --Pt complained of some burning with urination after already being treated for UTI and after Foley removed.  Per wife, SNF was concerned about some "fungal" infection of pt's penis.  Given also that pt had received abx, pt was ordered empiric tx for yeast infection with 3 days of oral fluconazole.  Urinary retention with Foley on presentation, resolved --Per SNF, pt had no urine output for about a week (pt is dialysis pt), and complained of abdominal pain.  Foley inserted with 1L urine return at SNF.    Abdominal pain resolved after bladder decompression. --Foley d/c'ed during this hospitalization.  Flomax started.  Pt has been able to void on his own since, although small amount. --Recommend I/O cath every 2 days if pt couldn't void on his own, and avoid chronic Foley --cont flomax (new)  Recent COVID-19 virus infection, not currently active -COVID-positive on 11/12 -S/p remdesivir in November 2022 --no need for repeat tx     Elevated troponin - Suspect demand ischemia - Troponin 114, and trended down to 95   Recent Bacteremia due to Enterococcus, not currently active -already completed treatment prior to last discharge.  The Invanz ordered at the facility was for empiric tx of UTI, and pt reportedly declined.     Acute on chronic anemia - Hgb  gradually trending down, in 7's, likely due to chronic disease and hospital blood draws.  No signs of bleeding.   --Hgb dropped to 6.7 on 12/11.  Pt was hesitant to receive blood transfusion, however, did agree to have blood given during dialysis.  Hgb went up to 7.3 the next day, so blood transfusion was held. --EPO with dialysis     COPD (chronic obstructive pulmonary disease) ([place]) --Not in exacerbation and no wheezing.  Had intermittent dyspnea that improved with DuoNeb, however, pt reported the neb treatment made him feel sick and uneasy. --discharged on daily Breo and albuterol inhaler PRN.     Aortic valve regurgitation, severe - Last echo was on 11/5 with severe aortic regurg     ESRD on hemodialysis ([place]) - iHD per nephrology   GERD --cont oral PPI  Poor oral intake --cont PTA Marinol and Reglan. --cont Nepro supplements.   Discharge Diagnoses:  Principal Problem:   Acute respiratory failure with hypoxia (HCC) Active Problems:   COPD (chronic obstructive pulmonary disease) (HCC)   Elevated troponin   Aortic valve regurgitation   COVID-19 virus infection   Bacteremia due to Enterococcus   Acute on chronic anemia   ESRD on hemodialysis (HCC)   Pressure injury of skin   30 Day Unplanned Readmission Risk Score    Flowsheet Row ED to Hosp-Admission (Current) from 02/05/2021 in [place] (1C)  30 Day Unplanned Readmission Risk Score (%) 27.11 Filed at 02/09/2021 0801       This score is the patient's risk of an unplanned readmission within 30 days of being discharged (0 -100%). The score is based on dignosis, age, lab data, medications, orders, and past utilization.   Low:  0-14.9   Medium: 15-21.9   High: 22-29.9   Extreme: 30 and above         Discharge Instructions:  Allergies as of 02/09/2021       Reactions   Aggrenox [aspirin-dipyridamole Er] Nausea And Vomiting   Atorvastatin Other (See Comments)   Muscle pain    Azithromycin    Cefpodoxime Other (See Comments)   headache   Ciprofloxacin    Claritin [loratadine] Other (See Comments)   Urinary retention   Contrast Media [iodinated Diagnostic Agents] Hives   Doxazosin Other (See Comments)   Elevated blood pressure   Doxycycline    Equal [aspartame] Diarrhea   Fenofibrate Other (See Comments)   Muscle pain   Flomax [tamsulosin] Other (See Comments)   Elevated blood pressure   Flunisolide Other (See Comments)   Burning sensation   Fluticasone-salmeterol    Throat irriation   Fluvastatin Other (See Comments)   Muscle pain   Gemfibrozil Other (See Comments)   Muscle pain   Haemophilus Influenzae Vaccines Other (See Comments)   Chest pain   Hydrochlorothiazide Other (See Comments)   weakness   Levofloxacin Other (See Comments)   Nervousness, aggitation   Lisinopril Other (See Comments)   Chest pain   Moxifloxacin Other (See Comments)  Altered behavior   Niacin And Related Other (See Comments)   flushing   Oxycodone    Plavix [clopidogrel] Other (See Comments)   Constriction in throat   Pravastatin Other (See Comments)   Elevated blood pressure   Simvastatin Other (See Comments)   Muscle pain   Terazosin Other (See Comments)   Fatigue   Zetia [ezetimibe] Other (See Comments)   Edema   Amoxicillin Palpitations   Chest pain   Mometasone Palpitations   Elevated blood pressure   Sulfa Antibiotics Rash, Hypertension        Medication List     STOP taking these medications    pseudoephedrine-acetaminophen 30-500 MG Tabs tablet Commonly known as: TYLENOL SINUS       TAKE these medications    albuterol 108 (90 Base) MCG/ACT inhaler Commonly known as: VENTOLIN HFA Inhale 2 puffs into the lungs every 6 (six) hours as needed for wheezing or shortness of breath.   bisacodyl 10 MG suppository Commonly known as: DULCOLAX Place 10 mg rectally as needed for moderate constipation.   calamine lotion Apply 1 application  topically as needed for itching.   cholecalciferol 25 MCG (1000 UNIT) tablet Commonly known as: VITAMIN D3 Take 1,000 Units by mouth daily.   dronabinol 5 MG capsule Commonly known as: MARINOL Take 5 mg by mouth 2 (two) times daily before a meal.   epoetin alfa 10000 UNIT/ML injection Commonly known as: EPOGEN Inject 1 mL (10,000 Units total) into the vein every Monday, Wednesday, and Friday with hemodialysis.   feeding supplement (NEPRO CARB STEADY) Liqd Take 237 mLs by mouth 3 (three) times daily between meals.   ferrous sulfate 325 (65 FE) MG tablet Take 1 tablet (325 mg total) by mouth daily with breakfast.   Fish Oil 1000 MG Caps TAKE 3 CAPSULES (SEA-OMEGA 30 FISH OIL) BY MOUTH TWO TIMES A DAY FOR CHOLESTEROL   fluconazole 100 MG tablet Commonly known as: DIFLUCAN Take 1 tablet (100 mg total) by mouth daily for 2 days. Start taking on: February 10, 2021   fluticasone furoate-vilanterol 200-25 MCG/ACT Aepb Commonly known as: BREO ELLIPTA Inhale 1 puff into the lungs daily.   ketotifen 0.025 % ophthalmic solution Commonly known as: ZADITOR 1 drop 2 (two) times daily.   metoCLOPramide 5 MG tablet Commonly known as: REGLAN Take 5 mg by mouth 4 (four) times daily -  before meals and at bedtime.   multivitamin Tabs tablet Take 1 tablet by mouth at bedtime.   nystatin powder Generic drug: nystatin Apply 1 application topically 2 (two) times daily.   omega-3 acid ethyl esters 1 g capsule Commonly known as: LOVAZA Take 3 g by mouth 2 (two) times daily.   pantoprazole 40 MG tablet Commonly known as: PROTONIX 1 tablet daily.   polyethylene glycol 17 g packet Commonly known as: MIRALAX / GLYCOLAX Take 17 g by mouth 2 (two) times daily.   rosuvastatin 40 MG tablet Commonly known as: CRESTOR Take 40 mg by mouth daily.   tamsulosin 0.4 MG Caps capsule Commonly known as: FLOMAX Take 1 capsule (0.4 mg total) by mouth daily.         Follow-up [facility]. Schedule an appointment as soon as possible for a visit.   Specialty: General Practice Contact information: [place] [address] [phone]                 Allergies  Allergen Reactions   Aggrenox [  Aspirin-Dipyridamole Er] Nausea And Vomiting   Atorvastatin Other (See Comments)    Muscle pain   Azithromycin    Cefpodoxime Other (See Comments)    headache   Ciprofloxacin    Claritin [Loratadine] Other (See Comments)    Urinary retention   Contrast Media [Iodinated Diagnostic Agents] Hives   Doxazosin Other (See Comments)    Elevated blood pressure   Doxycycline    Equal [Aspartame] Diarrhea   Fenofibrate Other (See Comments)    Muscle pain   Flomax [Tamsulosin] Other (See Comments)    Elevated blood pressure   Flunisolide Other (See Comments)    Burning sensation   Fluticasone-Salmeterol     Throat irriation   Fluvastatin Other (See Comments)    Muscle pain   Gemfibrozil Other (See Comments)    Muscle pain   Haemophilus Influenzae Vaccines Other (See Comments)    Chest pain   Hydrochlorothiazide Other (See Comments)    weakness   Levofloxacin Other (See Comments)    Nervousness, aggitation   Lisinopril Other (See Comments)    Chest pain   Moxifloxacin Other (See Comments)    Altered behavior   Niacin And Related Other (See Comments)    flushing   Oxycodone    Plavix [Clopidogrel] Other (See Comments)    Constriction in throat   Pravastatin Other (See Comments)    Elevated blood pressure   Simvastatin Other (See Comments)    Muscle pain   Terazosin Other (See Comments)    Fatigue    Zetia [Ezetimibe] Other (See Comments)    Edema    Amoxicillin Palpitations    Chest pain   Mometasone Palpitations    Elevated blood pressure   Sulfa Antibiotics Rash and Hypertension     The results of significant diagnostics from this hospitalization (including imaging, microbiology, ancillary and laboratory) are  listed below for reference.   Consultations:   Procedures/Studies: CT HEAD WO CONTRAST (5MM)  Result Date: 02/05/2021 CLINICAL DATA:  Unwitnessed fall. EXAM: CT HEAD WITHOUT CONTRAST TECHNIQUE: Contiguous axial images were obtained from the base of the skull through the vertex without intravenous contrast. COMPARISON:  None. FINDINGS: Brain: No evidence of acute infarction, hemorrhage, hydrocephalus, extra-axial collection or mass lesion/mass effect. Vascular: No hyperdense vessel or unexpected calcification. Skull: Normal. Negative for fracture or focal lesion. Sinuses/Orbits: No acute finding. Other: None. IMPRESSION: No acute intracranial abnormality is noted. Electronically Signed   By: Marijo Conception M.D.   On: 02/05/2021 08:08   PERIPHERAL VASCULAR CATHETERIZATION  Result Date: 01/20/2021 See surgical note for result.  DG Chest Portable 1 View  Result Date: 02/05/2021 CLINICAL DATA:  Shortness of breath EXAM: PORTABLE CHEST 1 VIEW COMPARISON:  01/10/2021 FINDINGS: Cardiac shadow is stable. Aortic calcifications are noted. New dialysis catheter is noted in satisfactory position. Chronic fibrotic changes are noted bilaterally similar to that seen on the prior study. Improved aeration in the right lung base is noted when compare with the prior study. No bony abnormality is noted. IMPRESSION: Chronic changes without acute abnormality. Electronically Signed   By: Inez Catalina M.D.   On: 02/05/2021 01:27      Labs: BNP (last 3 results) Recent Labs    01/03/21 0605 01/09/21 0543 02/05/21 0130  BNP 473.6* 3,236.3* 8,921.1*   Basic Metabolic Panel: Recent Labs  Lab 02/05/21 0130 02/05/21 0342 02/06/21 0455 02/07/21 0524 02/08/21 0427 02/09/21 0435  NA 135  --  136 137 136 132*  K 3.0*  --  4.8 3.2*  4.1 3.9  CL 98  --  101 98 97* 96*  CO2 27  --  26 29 30 28   GLUCOSE 90  --  152* 91 93 107*  BUN 12  --  39* 24* 39* 43*  CREATININE 3.13*  --  5.01* 3.02* 3.95* 4.63*  CALCIUM  7.6*  --  8.0* 7.6* 7.7* 7.6*  MG  --  1.6* 1.9 1.6* 1.6* 1.6*   Liver Function Tests: Recent Labs  Lab 02/05/21 0130  AST 66*  ALT 112*  ALKPHOS 94  BILITOT 0.7  PROT 6.1*  ALBUMIN 2.0*   No results for input(s): LIPASE, AMYLASE in the last 168 hours. No results for input(s): AMMONIA in the last 168 hours. CBC: Recent Labs  Lab 02/05/21 0130 02/06/21 0455 02/07/21 0524 02/08/21 0427 02/09/21 0435  WBC 6.6 8.0 10.5 7.4 9.0  NEUTROABS 4.0  --   --   --   --   HGB 7.6* 7.6* 7.4* 6.7* 7.3*  HCT 23.8* 23.6* 23.3* 20.9* 22.7*  MCV 89.8 90.4 88.3 89.3 88.7  PLT 145* 151 160 130* 129*   Cardiac Enzymes: No results for input(s): CKTOTAL, CKMB, CKMBINDEX, TROPONINI in the last 168 hours. BNP: Invalid input(s): POCBNP CBG: No results for input(s): GLUCAP in the last 168 hours. D-Dimer No results for input(s): DDIMER in the last 72 hours. Hgb A1c No results for input(s): HGBA1C in the last 72 hours. Lipid Profile No results for input(s): CHOL, HDL, LDLCALC, TRIG, CHOLHDL, LDLDIRECT in the last 72 hours. Thyroid function studies No results for input(s): TSH, T4TOTAL, T3FREE, THYROIDAB in the last 72 hours.  Invalid input(s): FREET3 Anemia work up No results for input(s): VITAMINB12, FOLATE, FERRITIN, TIBC, IRON, RETICCTPCT in the last 72 hours. Urinalysis    Component Value Date/Time   COLORURINE YELLOW 02/05/2021 0130   APPEARANCEUR TURBID (A) 02/05/2021 0130   LABSPEC 1.020 02/05/2021 0130   PHURINE 7.0 02/05/2021 0130   GLUCOSEU NEGATIVE 02/05/2021 0130   HGBUR LARGE (A) 02/05/2021 0130   BILIRUBINUR SMALL (A) 02/05/2021 0130   KETONESUR TRACE (A) 02/05/2021 0130   PROTEINUR >300 (A) 02/05/2021 0130   NITRITE NEGATIVE 02/05/2021 0130   LEUKOCYTESUR LARGE (A) 02/05/2021 0130   Sepsis Labs Invalid input(s): PROCALCITONIN,  WBC,  LACTICIDVEN Microbiology Recent Results (from the past 240 hour(s))  Urine Culture     Status: Abnormal   Collection Time: 02/05/21   1:30 AM   Specimen: Urine, Random  Result Value Ref Range Status   Specimen Description   Final    URINE, RANDOM Performed at [facility], [address], [place], [place] [address]    Special Requests   Final    NONE Performed at [facility], [address] [place], [place] [address]    Culture (A)  Final    >=100,000 COLONIES/mL KLEBSIELLA PNEUMONIAE Confirmed Extended Spectrum Beta-Lactamase Producer (ESBL).  In bloodstream infections from ESBL organisms, carbapenems are preferred over piperacillin/tazobactam. They are shown to have a lower risk of mortality.    Report Status 02/07/2021 FINAL  Final   Organism ID, Bacteria KLEBSIELLA PNEUMONIAE (A)  Final      Susceptibility   Klebsiella pneumoniae - MIC*    AMPICILLIN >=32 RESISTANT Resistant     CEFAZOLIN >=64 RESISTANT Resistant     CEFEPIME >=32 RESISTANT Resistant     CEFTRIAXONE >=64 RESISTANT Resistant     CIPROFLOXACIN 0.5 INTERMEDIATE Intermediate     GENTAMICIN <=1 SENSITIVE Sensitive     IMIPENEM <=  0.25 SENSITIVE Sensitive     NITROFURANTOIN 64 INTERMEDIATE Intermediate     TRIMETH/SULFA >=320 RESISTANT Resistant     AMPICILLIN/SULBACTAM 16 INTERMEDIATE Intermediate     PIP/TAZO <=4 SENSITIVE Sensitive     * >=100,000 COLONIES/mL KLEBSIELLA PNEUMONIAE  Culture, blood (Routine X 2) w Reflex to ID Panel     Status: None (Preliminary result)   Collection Time: 02/05/21  1:30 AM   Specimen: BLOOD  Result Value Ref Range Status   Specimen Description BLOOD LEFT ASSIST CONTROL  Final   Special Requests   Final    BOTTLES DRAWN AEROBIC AND ANAEROBIC Blood Culture adequate volume   Culture   Final    NO GROWTH 4 DAYS Performed at [facility], [address] [place], [place] [address]    Report Status PENDING  Incomplete  Culture, blood (Routine X 2) w Reflex to ID Panel     Status: None (Preliminary result)   Collection Time: 02/05/21  1:30 AM   Specimen: BLOOD  Result Value Ref  Range Status   Specimen Description BLOOD RIGHT ASSIST CONTROL  Final   Special Requests   Final    BOTTLES DRAWN AEROBIC AND ANAEROBIC Blood Culture adequate volume   Culture   Final    NO GROWTH 4 DAYS Performed at [facility], [address] [place], [place] [address]    Report Status PENDING  Incomplete  Resp Panel by RT-PCR (Flu A&B, Covid) Urine, Catheterized     Status: Abnormal   Collection Time: 02/05/21  1:30 AM   Specimen: Urine, Catheterized; Nasopharyngeal(NP) swabs in vial transport medium  Result Value Ref Range Status   SARS Coronavirus 2 by RT PCR POSITIVE (A) NEGATIVE Final    Comment: RESULT CALLED TO, READ BACK BY AND VERIFIED WITH: AUSTIN REEVES@[id number]  02/05/21 RH (NOTE) SARS-CoV-2 target nucleic acids are DETECTED.  The SARS-CoV-2 RNA is generally detectable in upper respiratory specimens during the acute phase of infection. Positive results are indicative of the presence of the identified virus, but do not rule out bacterial infection or co-infection with other pathogens not detected by the test. Clinical correlation with patient history and other diagnostic information is necessary to determine patient infection status. The expected result is Negative.  Fact Sheet for Patients: EntrepreneurPulse.com.au  Fact Sheet for Healthcare Providers: IncredibleEmployment.be  This test is not yet approved or cleared by the [place] FDA and  has been authorized for detection and/or diagnosis of SARS-CoV-2 by FDA under an Emergency Use Authorization (EUA).  This EUA will remain in effect (meaning this test can be use d) for the duration of  the COVID-19 declaration under Section 564(b)(1) of the Act, 21 U.S.C. section 360bbb-3(b)(1), unless the authorization is terminated or revoked sooner.     Influenza A by PCR NEGATIVE NEGATIVE Final   Influenza B by PCR NEGATIVE NEGATIVE Final    Comment: (NOTE) The Xpert  Xpress SARS-CoV-2/FLU/RSV plus assay is intended as an aid in the diagnosis of influenza from Nasopharyngeal swab specimens and should not be used as a sole basis for treatment. Nasal washings and aspirates are unacceptable for Xpert Xpress SARS-CoV-2/FLU/RSV testing.  Fact Sheet for Patients: EntrepreneurPulse.com.au  Fact Sheet for Healthcare Providers: IncredibleEmployment.be  This test is not yet approved or cleared by the [place] FDA and has been authorized for detection and/or diagnosis of SARS-CoV-2 by FDA under an Emergency Use Authorization (EUA). This EUA will remain in effect (meaning this test can be used) for the duration  of the COVID-19 declaration under Section 564(b)(1) of the Act, 21 U.S.C. section 360bbb-3(b)(1), unless the authorization is terminated or revoked.  Performed at [facility], [place], [place] [address]   Respiratory (~20 pathogens) panel by PCR     Status: None   Collection Time: 02/07/21 11:10 AM   Specimen: Nasopharyngeal Swab; Respiratory  Result Value Ref Range Status   Adenovirus NOT DETECTED NOT DETECTED Final   Coronavirus 229E NOT DETECTED NOT DETECTED Final    Comment: (NOTE) The Coronavirus on the Respiratory Panel, DOES NOT test for the novel  Coronavirus (2019 nCoV)    Coronavirus HKU1 NOT DETECTED NOT DETECTED Final   Coronavirus NL63 NOT DETECTED NOT DETECTED Final   Coronavirus OC43 NOT DETECTED NOT DETECTED Final   Metapneumovirus NOT DETECTED NOT DETECTED Final   Rhinovirus / Enterovirus NOT DETECTED NOT DETECTED Final   Influenza A NOT DETECTED NOT DETECTED Final   Influenza B NOT DETECTED NOT DETECTED Final   Parainfluenza Virus 1 NOT DETECTED NOT DETECTED Final   Parainfluenza Virus 2 NOT DETECTED NOT DETECTED Final   Parainfluenza Virus 3 NOT DETECTED NOT DETECTED Final   Parainfluenza Virus 4 NOT DETECTED NOT DETECTED Final   Respiratory Syncytial Virus  NOT DETECTED NOT DETECTED Final   Bordetella pertussis NOT DETECTED NOT DETECTED Final   Bordetella Parapertussis NOT DETECTED NOT DETECTED Final   Chlamydophila pneumoniae NOT DETECTED NOT DETECTED Final   Mycoplasma pneumoniae NOT DETECTED NOT DETECTED Final    Comment: Performed at [facility], [place]. [address] [place], [place] [address]     Total time spend on discharging this patient, including the last patient exam, discussing the hospital stay, instructions for ongoing care as it relates to all pertinent caregivers, as well as preparing the medical discharge records, prescriptions, and/or referrals as applicable, is 35 minutes.    Enzo Bi, MD  [facility] 02/09/2021, 10:16 AM

## 2021-02-09 NOTE — Progress Notes (Signed)
Report given to [facility] LPN at [facility]. All questions and concerns clarified at this time.

## 2021-02-09 NOTE — NC FL2 (Signed)
[place] LEVEL OF CARE SCREENING TOOL     IDENTIFICATION  Patient Name: Phillip Chavez Birthdate: 1940-04-05 Sex: male Admission Date (Current Location): 02/05/2021  [facility] and [place] Number:  Engineering geologist and Address:  [facility], [address], [place], [place] [address]      Provider Number: [id number]  Attending Physician Name and Address:  Enzo Bi, MD  Relative Name and Phone Number:  Phillip, Chavez (Spouse)   [phone]    Current Level of Care: Hospital Recommended Level of Care: [place] Prior Approval Number:    Date Approved/Denied:   PASRR Number:    Discharge Plan: SNF    Current Diagnoses: Patient Active Problem List   Diagnosis Date Noted   Pressure injury of skin 02/06/2021   Bacteremia due to Enterococcus 02/05/2021   Acute on chronic anemia 02/05/2021   ESRD on hemodialysis ([place]) 02/05/2021   COVID-19 virus infection 01/14/2021   Aortic valve regurgitation    CAP (community acquired pneumonia) 01/03/2021   Sepsis ([place]) 01/03/2021   COPD (chronic obstructive pulmonary disease) ([place]) 01/03/2021   GERD (gastroesophageal reflux disease) 01/03/2021   HTN (hypertension)    HLD (hyperlipidemia)    Acute respiratory failure with hypoxia (HCC)    Elevated troponin    AKI (acute kidney injury) ([place])    Normocytic anemia     Orientation RESPIRATION BLADDER Height & Weight     Self, Time, Situation  O2 (2L [place]) Continent Weight: 72.1 kg Height:  5\' 5"  (165.1 cm)  BEHAVIORAL SYMPTOMS/MOOD NEUROLOGICAL BOWEL NUTRITION STATUS      Continent Diet  AMBULATORY STATUS COMMUNICATION OF NEEDS Skin   Limited Assist Verbally Normal                       Personal Care Assistance Level of Assistance  Bathing, Feeding, Dressing Bathing Assistance: Limited assistance Feeding assistance: Independent Dressing Assistance: Limited assistance     Functional Limitations Info  Sight,  Hearing, Speech Sight Info: Adequate   Speech Info: Adequate    SPECIAL CARE FACTORS FREQUENCY  OT (By licensed OT), PT (By licensed PT)     PT Frequency: 5X WEEK OT Frequency: 5X WEEK            Contractures Contractures Info: Not present    Additional Factors Info  Code Status, Allergies Code Status Info: Full Allergies Info: 31 see medication list           Current Medications (02/09/2021):  This is the current hospital active medication list Current Facility-Administered Medications  Medication Dose Route Frequency Provider Last Rate Last Admin   (feeding supplement) PROSource Plus liquid 30 mL  30 mL Oral TID BM Enzo Bi, MD   30 mL at 02/08/21 1417   0.9 %  sodium chloride infusion (Manually program via Guardrails IV Fluids)   Intravenous Once Enzo Bi, MD       0.9 %  sodium chloride infusion  100 mL Intravenous PRN Breeze, Shantelle, NP       0.9 %  sodium chloride infusion  100 mL Intravenous PRN Colon Flattery, NP       acetaminophen (TYLENOL) tablet 650 mg  650 mg Oral Q6H PRN Athena Masse, MD   650 mg at 02/07/21 0025   Or   acetaminophen (TYLENOL) suppository 650 mg  650 mg Rectal Q6H PRN Athena Masse, MD       alteplase (CATHFLO ACTIVASE) injection 2  mg  2 mg Intracatheter Once PRN Colon Flattery, NP       Chlorhexidine Gluconate Cloth 2 % PADS 6 each  6 each Topical Daily Enzo Bi, MD   6 each at 02/09/21 1036   cholecalciferol (VITAMIN D3) tablet 1,000 Units  1,000 Units Oral Daily Athena Masse, MD   1,000 Units at 02/08/21 0755   dronabinol (MARINOL) capsule 5 mg  5 mg Oral BID Bing Matter, MD   5 mg at 02/09/21 0759   epoetin alfa (EPOGEN) 4000 UNIT/ML injection            epoetin alfa (EPOGEN) injection 10,000 Units  10,000 Units Intravenous Q M,W,F-HD Colon Flattery, NP   10,000 Units at 02/09/21 1202   feeding supplement (NEPRO CARB STEADY) liquid 237 mL  237 mL Oral BID BM Enzo Bi, MD   237 mL at 02/08/21 1417   fluconazole  (DIFLUCAN) tablet 100 mg  100 mg Oral Daily Enzo Bi, MD       heparin injection 1,000 Units  1,000 Units Dialysis PRN Colon Flattery, NP       heparin injection 5,000 Units  5,000 Units Subcutaneous Q8H Athena Masse, MD   5,000 Units at 02/07/21 1542   heparin sodium (porcine) 1000 UNIT/ML injection            ipratropium-albuterol (DUONEB) 0.5-2.5 (3) MG/3ML nebulizer solution 3 mL  3 mL Nebulization BID Enzo Bi, MD       lidocaine (PF) (XYLOCAINE) 1 % injection 5 mL  5 mL Intradermal PRN Colon Flattery, NP       lidocaine-prilocaine (EMLA) cream 1 application  1 application Topical PRN Colon Flattery, NP       magnesium sulfate IVPB 2 g 50 mL  2 g Intravenous Once Enzo Bi, MD       multivitamin (RENA-VIT) tablet 1 tablet  1 tablet Oral QHS Enzo Bi, MD   1 tablet at 02/08/21 2009   mupirocin ointment (BACTROBAN) 2 %   Topical BID Enzo Bi, MD   Given at 02/08/21 2009   omega-3 acid ethyl esters (LOVAZA) capsule 3 g  3 g Oral BID Judd Gaudier V, MD   3 g at 02/08/21 2009   ondansetron (ZOFRAN) tablet 4 mg  4 mg Oral Q6H PRN Athena Masse, MD   4 mg at 02/08/21 1603   Or   ondansetron (ZOFRAN) injection 4 mg  4 mg Intravenous Q6H PRN Athena Masse, MD   4 mg at 02/07/21 2053   pantoprazole (PROTONIX) EC tablet 40 mg  40 mg Oral Daily Enzo Bi, MD   40 mg at 02/08/21 2009   pentafluoroprop-tetrafluoroeth (GEBAUERS) aerosol 1 application  1 application Topical PRN Colon Flattery, NP       tamsulosin (FLOMAX) capsule 0.4 mg  0.4 mg Oral Daily Enzo Bi, MD   0.4 mg at 02/08/21 0160     Discharge Medications: Please see discharge summary for a list of discharge medications.  Relevant Imaging Results:  Relevant Lab Results:   Additional Information SS# [id number]  Kerin Salen, RN

## 2021-02-09 NOTE — Progress Notes (Signed)
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  Patient reports to the emergency department with complaints of shortness of breath.  He is admitted for Shortness of breath [R06.02] COPD exacerbation ([place]) [J44.1] Acute UTI [N39.0] Anemia, unspecified type [D64.9] COVID-19 virus infection [U07.1] COVID-19 [U07.1]  Patient is known to our clinic from past hospitalization and was recently started on hemodialysis during previous admission. Patient currently receives outpatient treatments at [facility] on a MWF schedule.   Patient seen and evaluated during dialysis   HEMODIALYSIS FLOWSHEET:  Blood Flow Rate (mL/min): 400 mL/min Arterial Pressure (mmHg): -150 mmHg Venous Pressure (mmHg): 100 mmHg Transmembrane Pressure (mmHg): 50 mmHg Ultrafiltration Rate (mL/min): 330 mL/min Dialysate Flow Rate (mL/min): 500 ml/min Conductivity: Machine : 13.9 Conductivity: Machine : 13.9 Dialysis Fluid Bolus: Normal [place] Bolus Amount (mL): 250 mL  Continues to complain of weakness   Objective:  Vital signs in last 24 hours:  Temp:  [97.3 F (36.3 C)-98.2 F (36.8 C)] 97.6 F (36.4 C) (12/12 1014) Pulse Rate:  [85-103] 90 (12/12 1130) Resp:  [16-23] 19 (12/12 1130) BP: (110-137)/(34-46) 135/38 (12/12 1130) SpO2:  [90 %-98 %] 97 % (12/12 1130)  Weight change:  Filed Weights   02/05/21 0111 02/06/21 1532 02/06/21 1905  Weight: 63.5 kg 72.8 kg 73.3 kg    Intake/Output: I/O last 3 completed shifts: In: 360 [P.O.:360] Out: -    Intake/Output this shift:  No intake/output data recorded.  Physical Exam: General: Resting in bed, awake, alert  Head: Moist oral mucosal membranes  Eyes: Anicteric  Lungs:  Lungs clear bilaterally, respiration even ,unlabored on 2L [place]  Heart: S1S2, no rubs or gallops  Abdomen:   Soft, nontender  Extremities: No peripheral edema.  Neurologic: Able to answer simple questions appropriately  Skin: No acute lesions or rashes  Access: Right IJ PermCath    Basic Metabolic Panel: Recent Labs  Lab 02/05/21 0130 02/05/21 0342 02/06/21 0455 02/07/21 0524 02/08/21 0427 02/09/21 0435  NA 135  --  136 137 136 132*  K 3.0*  --  4.8 3.2* 4.1 3.9  CL 98  --  101 98 97* 96*  CO2 27  --  26 29 30 28   GLUCOSE 90  --  152* 91 93 107*  BUN 12  --  39* 24* 39* 43*  CREATININE 3.13*  --  5.01* 3.02* 3.95* 4.63*  CALCIUM 7.6*  --  8.0* 7.6* 7.7* 7.6*  MG  --  1.6* 1.9 1.6* 1.6* 1.6*     Liver Function Tests: Recent Labs  Lab 02/05/21 0130  AST 66*  ALT 112*  ALKPHOS 94  BILITOT 0.7  PROT 6.1*  ALBUMIN 2.0*    No results for input(s): LIPASE, AMYLASE in the last 168 hours. No results for input(s): AMMONIA in the last 168 hours.  CBC: Recent Labs  Lab 02/05/21 0130 02/06/21 0455 02/07/21 0524 02/08/21 0427 02/09/21 0435  WBC 6.6 8.0 10.5 7.4 9.0  NEUTROABS 4.0  --   --   --   --   HGB 7.6* 7.6* 7.4* 6.7* 7.3*  HCT 23.8* 23.6* 23.3* 20.9* 22.7*  MCV 89.8 90.4 88.3 89.3 88.7  PLT 145* 151 160 130* 129*     Cardiac Enzymes: No results for input(s): CKTOTAL, CKMB, CKMBINDEX, TROPONINI in the last 168 hours.  BNP: Invalid input(s): POCBNP  CBG: No results for input(s): GLUCAP in the last 168 hours.  Microbiology: Results for orders placed or performed during the hospital encounter of 02/05/21  Urine Culture     Status: Abnormal   Collection Time: 02/05/21  1:30 AM   Specimen: Urine, Random  Result Value Ref Range Status   Specimen Description   Final    URINE, RANDOM Performed at [facility], [address] [place], [place] [address]    Special Requests   Final    NONE Performed at [facility], [address] [place], [place] [address]    Culture (A)  Final    >=100,000 COLONIES/mL KLEBSIELLA PNEUMONIAE Confirmed  Extended Spectrum Beta-Lactamase Producer (ESBL).  In bloodstream infections from ESBL organisms, carbapenems are preferred over piperacillin/tazobactam. They are shown to have a lower risk of mortality.    Report Status 02/07/2021 FINAL  Final   Organism ID, Bacteria KLEBSIELLA PNEUMONIAE (A)  Final      Susceptibility   Klebsiella pneumoniae - MIC*    AMPICILLIN >=32 RESISTANT Resistant     CEFAZOLIN >=64 RESISTANT Resistant     CEFEPIME >=32 RESISTANT Resistant     CEFTRIAXONE >=64 RESISTANT Resistant     CIPROFLOXACIN 0.5 INTERMEDIATE Intermediate     GENTAMICIN <=1 SENSITIVE Sensitive     IMIPENEM <=0.25 SENSITIVE Sensitive     NITROFURANTOIN 64 INTERMEDIATE Intermediate     TRIMETH/SULFA >=320 RESISTANT Resistant     AMPICILLIN/SULBACTAM 16 INTERMEDIATE Intermediate     PIP/TAZO <=4 SENSITIVE Sensitive     * >=100,000 COLONIES/mL KLEBSIELLA PNEUMONIAE  Culture, blood (Routine X 2) w Reflex to ID Panel     Status: None (Preliminary result)   Collection Time: 02/05/21  1:30 AM   Specimen: BLOOD  Result Value Ref Range Status   Specimen Description BLOOD LEFT ASSIST CONTROL  Final   Special Requests   Final    BOTTLES DRAWN AEROBIC AND ANAEROBIC Blood Culture adequate volume   Culture   Final    NO GROWTH 4 DAYS Performed at [facility], [address] [place], [place] [address]    Report Status PENDING  Incomplete  Culture, blood (Routine X 2) w Reflex to ID Panel     Status: None (Preliminary result)   Collection Time: 02/05/21  1:30 AM   Specimen: BLOOD  Result Value Ref Range Status   Specimen Description BLOOD RIGHT ASSIST CONTROL  Final   Special Requests   Final    BOTTLES DRAWN AEROBIC AND ANAEROBIC Blood Culture adequate volume   Culture   Final    NO GROWTH 4 DAYS Performed at [facility], [address] [place], [place] [address]    Report Status PENDING  Incomplete  Resp Panel by RT-PCR (Flu A&B, Covid) Urine, Catheterized     Status:  Abnormal   Collection Time: 02/05/21  1:30 AM   Specimen: Urine, Catheterized; Nasopharyngeal(NP) swabs in vial transport medium  Result Value Ref Range Status   SARS Coronavirus 2 by RT PCR POSITIVE (A) NEGATIVE Final    Comment: RESULT CALLED TO, READ BACK BY AND VERIFIED WITH: AUSTIN REEVES@[id number]  02/05/21 RH (NOTE) SARS-CoV-2 target nucleic acids are DETECTED.  The SARS-CoV-2 RNA is generally detectable in upper respiratory specimens during the acute phase of infection. Positive results are indicative of the presence of the identified virus, but do not rule out bacterial infection or co-infection with other pathogens not detected by the test. Clinical correlation with patient history and other diagnostic  information is necessary to determine patient infection status. The expected result is Negative.  Fact Sheet for Patients: EntrepreneurPulse.com.au  Fact Sheet for Healthcare Providers: IncredibleEmployment.be  This test is not yet approved or cleared by the [place] FDA and  has been authorized for detection and/or diagnosis of SARS-CoV-2 by FDA under an Emergency Use Authorization (EUA).  This EUA will remain in effect (meaning this test can be use d) for the duration of  the COVID-19 declaration under Section 564(b)(1) of the Act, 21 U.S.C. section 360bbb-3(b)(1), unless the authorization is terminated or revoked sooner.     Influenza A by PCR NEGATIVE NEGATIVE Final   Influenza B by PCR NEGATIVE NEGATIVE Final    Comment: (NOTE) The Xpert Xpress SARS-CoV-2/FLU/RSV plus assay is intended as an aid in the diagnosis of influenza from Nasopharyngeal swab specimens and should not be used as a sole basis for treatment. Nasal washings and aspirates are unacceptable for Xpert Xpress SARS-CoV-2/FLU/RSV testing.  Fact Sheet for Patients: EntrepreneurPulse.com.au  Fact Sheet for Healthcare  Providers: IncredibleEmployment.be  This test is not yet approved or cleared by the [place] FDA and has been authorized for detection and/or diagnosis of SARS-CoV-2 by FDA under an Emergency Use Authorization (EUA). This EUA will remain in effect (meaning this test can be used) for the duration of the COVID-19 declaration under Section 564(b)(1) of the Act, 21 U.S.C. section 360bbb-3(b)(1), unless the authorization is terminated or revoked.  Performed at [facility], [place], [place] [address]   Respiratory (~20 pathogens) panel by PCR     Status: None   Collection Time: 02/07/21 11:10 AM   Specimen: Nasopharyngeal Swab; Respiratory  Result Value Ref Range Status   Adenovirus NOT DETECTED NOT DETECTED Final   Coronavirus 229E NOT DETECTED NOT DETECTED Final    Comment: (NOTE) The Coronavirus on the Respiratory Panel, DOES NOT test for the novel  Coronavirus (2019 nCoV)    Coronavirus HKU1 NOT DETECTED NOT DETECTED Final   Coronavirus NL63 NOT DETECTED NOT DETECTED Final   Coronavirus OC43 NOT DETECTED NOT DETECTED Final   Metapneumovirus NOT DETECTED NOT DETECTED Final   Rhinovirus / Enterovirus NOT DETECTED NOT DETECTED Final   Influenza A NOT DETECTED NOT DETECTED Final   Influenza B NOT DETECTED NOT DETECTED Final   Parainfluenza Virus 1 NOT DETECTED NOT DETECTED Final   Parainfluenza Virus 2 NOT DETECTED NOT DETECTED Final   Parainfluenza Virus 3 NOT DETECTED NOT DETECTED Final   Parainfluenza Virus 4 NOT DETECTED NOT DETECTED Final   Respiratory Syncytial Virus NOT DETECTED NOT DETECTED Final   Bordetella pertussis NOT DETECTED NOT DETECTED Final   Bordetella Parapertussis NOT DETECTED NOT DETECTED Final   Chlamydophila pneumoniae NOT DETECTED NOT DETECTED Final   Mycoplasma pneumoniae NOT DETECTED NOT DETECTED Final    Comment: Performed at [facility], [place]. [address], [place], [place] [address]    Coagulation  Studies: No results for input(s): LABPROT, INR in the last 72 hours.  Urinalysis: No results for input(s): COLORURINE, LABSPEC, PHURINE, GLUCOSEU, HGBUR, BILIRUBINUR, KETONESUR, PROTEINUR, UROBILINOGEN, NITRITE, LEUKOCYTESUR in the last 72 hours.  Invalid input(s): APPERANCEUR     Imaging: No results found.   Medications:    sodium chloride     sodium chloride     magnesium sulfate bolus IVPB       (feeding supplement) PROSource Plus  30 mL Oral TID BM   sodium chloride   Intravenous Once   Chlorhexidine Gluconate  Cloth  6 each Topical Daily   cholecalciferol  1,000 Units Oral Daily   dronabinol  5 mg Oral BID AC   epoetin (EPOGEN/PROCRIT) injection  10,000 Units Intravenous Q M,W,F-HD   feeding supplement (NEPRO CARB STEADY)  237 mL Oral BID BM   fluconazole  100 mg Oral Daily   heparin  5,000 Units Subcutaneous Q8H   ipratropium-albuterol  3 mL Nebulization BID   multivitamin  1 tablet Oral QHS   mupirocin ointment   Topical BID   omega-3 acid ethyl esters  3 g Oral BID   pantoprazole  40 mg Oral Daily   tamsulosin  0.4 mg Oral Daily   sodium chloride, sodium chloride, acetaminophen **OR** acetaminophen, alteplase, heparin, lidocaine (PF), lidocaine-prilocaine, ondansetron **OR** ondansetron (ZOFRAN) IV, pentafluoroprop-tetrafluoroeth  Assessment/ Plan:  Phillip Chavez is a 80 y.o.  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  Patient reports to the emergency department with complaints of shortness of breath.  He is admitted for Shortness of breath [R06.02] COPD exacerbation ([place]) [J44.1] Acute UTI [N39.0] Anemia, unspecified type [D64.9] COVID-19 virus infection [U07.1] COVID-19 [U07.1]  [facility]/MWF/ Rt Permcath  End-stage renal disease with hypokalemia on dialysis.   Receiving dialysis today, UF goal 1L as tolerated Patient scheduled discharge to [facility] after  dialysis. Outpatient treatments will be resumed at [facility] clinic.   2. Anemia of chronic kidney disease Lab Results  Component Value Date   HGB 7.3 (L) 02/09/2021  Continue Epogen with HD Blood transfusion held due to patient hesitance and slight recovery of Hgb  3. Secondary Hyperparathyroidism:  Lab Results  Component Value Date   CALCIUM 7.6 (L) 02/09/2021   PHOS 7.2 (H) 01/12/2021  Elevated phosphorus and corrected calcium of 9.2 Cholecalciferol ordered dialy     LOS: 4 Wilberta Dorvil 12/12/202211:44 AM

## 2021-02-10 LAB — BPAM RBC
Blood Product Expiration Date: 202301082359
Unit Type and Rh: 7300

## 2021-02-10 LAB — CULTURE, BLOOD (ROUTINE X 2)
Culture: NO GROWTH
Culture: NO GROWTH
Special Requests: ADEQUATE
Special Requests: ADEQUATE

## 2021-02-10 LAB — TYPE AND SCREEN
ABO/RH(D): B POS
Antibody Screen: NEGATIVE
Unit division: 0

## 2021-02-10 LAB — PREPARE RBC (CROSSMATCH)

## 2021-02-12 ENCOUNTER — Non-Acute Institutional Stay: Payer: Self-pay | Admitting: Primary Care

## 2021-02-12 ENCOUNTER — Other Ambulatory Visit: Payer: Self-pay

## 2021-02-12 DIAGNOSIS — R7881 Bacteremia: Secondary | ICD-10-CM

## 2021-02-12 DIAGNOSIS — Z992 Dependence on renal dialysis: Secondary | ICD-10-CM

## 2021-02-12 DIAGNOSIS — U071 COVID-19: Secondary | ICD-10-CM

## 2021-02-12 DIAGNOSIS — Z515 Encounter for palliative care: Secondary | ICD-10-CM

## 2021-02-12 DIAGNOSIS — J189 Pneumonia, unspecified organism: Secondary | ICD-10-CM

## 2021-02-12 DIAGNOSIS — N186 End stage renal disease: Secondary | ICD-10-CM

## 2021-02-12 NOTE — Progress Notes (Signed)
[facility] Palliative Care Consult Note Telephone: [phone]  Fax: [phone]   Date of encounter: 02/12/21 12:04 PM PATIENT NAME: Phillip Chavez Phillip Chavez [id number]   [phone] (home)  DOB: 04-16-1940 MRN: [id number] PRIMARY CARE PROVIDER:    [facility],  [place] [address] [phone]  REFERRING PROVIDER:   Cephas Darby, [address] [place],  [place] [address] [phone]   RESPONSIBLE PARTY:    Contact Information     Name Relation Home Work Mobile   DESHONE, LYSSY Spouse [place] Son   [phone]   Stephenie Acres   [phone]        I met face to face with patient and family in [facility]. Palliative Care was asked to follow this patient by consultation request of  Cephas Darby, FNP  to address advance care planning and complex medical decision making. This is the initial visit.                                     ASSESSMENT AND PLAN / RECOMMENDATIONS:   Advance Care Planning/Goals of Care: Goals include to maximize quality of life and symptom management. Patient/health care surrogate gave his/her permission to discuss.Our advance care planning conversation included a discussion about:    The value and importance of advance care planning  Exploration of personal, cultural or spiritual beliefs that might influence medical decisions  Exploration of goals of care in the event of a sudden injury or illness  Review a of an  advance directive document . CODE STATUS: DNR  I reviewed a MOST form today. The patient and family outlined their wishes for the following treatment decisions:  Cardiopulmonary Resuscitation: Do Not Attempt Resuscitation (DNR/No CPR)  Medical Interventions: Limited Additional Interventions: Use medical treatment, IV fluids and cardiac monitoring as indicated, DO NOT USE intubation or mechanical ventilation. May  consider use of less invasive airway support such as BiPAP or CPAP. Also provide comfort measures. Transfer to the hospital if indicated. Avoid intensive care.   Antibiotics: Determine use of limitation of antibiotics when infection occurs  IV Fluids: IV fluids for a defined trial period  Feeding Tube: No feeding tube    Symptom Management/Plan:  Patient has had increased dyspnea at the SNF, poor po intake and fatigue NP is rx for pneumonia again, as probably did not clear.  We discussed his decline, his poor intake and fatigue. 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  Appears DOE and weak. Concerned he is not making significant headway getting back to his baseline. Family to discuss with SNF possibly appealing for more SNF days due to his set backs in rehab.  Follow up Palliative Care Visit: Palliative care will continue to follow for complex medical decision making, advance care planning, and clarification of goals. Return  prn.  I spent 35 minutes providing this consultation. More than 50% of the time in this consultation was spent in counseling and care coordination.  PPS: 30%  HOSPICE ELIGIBILITY/DIAGNOSIS: yes with concordant goals of care/ COPD  Chief Complaint: dyspnea, CAP  HISTORY OF PRESENT ILLNESS:  Phillip Chavez is a 80 y.o. year old male  with ESRD, severe valve regurgitation, COPD, recent covid infection, CAP. He presents at SNF with worsening DOE and  SOB at baseline. Facility NP has done xray showing increasing infiltrates. Patient voices discouragement at set backs, as his goal is to get better and consider heart surgery.  History obtained from review of EMR, discussion with primary team, and interview with family, facility staff/caregiver and/or Mr. Boom.  I reviewed available labs, medications, imaging, studies and related documents from the EMR.  Records reviewed and summarized above.   ROS   General:  NAD ENMT: denies dysphagia Cardiovascular: denies chest pain, endorses DOE Pulmonary: endorses cough, endorses  increased SOB Abdomen: endorses fair appetite, denies constipation, endorses continence of bowel GU: denies dysuria, endorses continence of urine MSK:  endorses weakness,  no falls reported Skin: dialysis cath in place Neurological: denies pain, denies insomnia Psych: Endorses  depressed/ anxious mood Heme/lymph/immuno: denies bruises, abnormal bleeding  Physical Exam: Current and past weights: 154-170 lbs in SNF chart Constitutional: NAD General: frail appearing, thin/ EYES: anicteric sclera, lids intact, no discharge  ENMT: intact hearing, oral mucous membranes moist, dentition intact CV: S1S2, RRR, no LE edema Pulmonary: LCTA, slight  increased work of breathing, no cough, oxygen 2 L / [place] Abdomen: intake 20-30%, normo-active BS + 4 quadrants, soft and non tender, no ascites GU: deferred MSK: +sarcopenia, moves all extremities, ambulatory with assistance Skin: warm and dry, no rashes or wounds on visible skin Neuro:  ++ generalized weakness,  no cognitive impairment Psych: slightly anxious affect, A and O x 3 Hem/lymph/immuno: no widespread bruising   CURRENT PROBLEM LIST:  Patient Active Problem List   Diagnosis Date Noted   Pressure injury of skin 02/06/2021   Bacteremia due to Enterococcus 02/05/2021   Acute on chronic anemia 02/05/2021   ESRD on hemodialysis ([place]) 02/05/2021   COVID-19 virus infection 01/14/2021   Aortic valve regurgitation    CAP (community acquired pneumonia) 01/03/2021   Sepsis ([place]) 01/03/2021   COPD (chronic obstructive pulmonary disease) ([place]) 01/03/2021   GERD (gastroesophageal reflux disease) 01/03/2021   HTN (hypertension)    HLD (hyperlipidemia)    Acute respiratory failure with hypoxia (HCC)    Elevated troponin    AKI (acute kidney injury) ([place])    Normocytic anemia    PAST MEDICAL HISTORY:  Active Ambulatory Problems     Diagnosis Date Noted   CAP (community acquired pneumonia) 01/03/2021   Sepsis ([place]) 01/03/2021   COPD (chronic obstructive pulmonary disease) ([place]) 01/03/2021   GERD (gastroesophageal reflux disease) 01/03/2021   HTN (hypertension)    HLD (hyperlipidemia)    Acute respiratory failure with hypoxia (HCC)    Elevated troponin    AKI (acute kidney injury) ([place])    Normocytic anemia    Aortic valve regurgitation    COVID-19 virus infection 01/14/2021   Bacteremia due to Enterococcus 02/05/2021   Acute on chronic anemia 02/05/2021   ESRD on hemodialysis ([place]) 02/05/2021   Pressure injury of skin 02/06/2021   Resolved Ambulatory Problems    Diagnosis Date Noted   No Resolved Ambulatory Problems   Past Medical History:  Diagnosis Date   Chronic CHF (congestive heart failure) (HCC)    SOCIAL HX:  Social History   Tobacco Use   Smoking status: Former    Packs/day: 1.00    Years: 20.00    Pack years: 20.00    Types: Cigarettes    Quit date: 03/21/1978    Years since quitting: 42.9   Smokeless tobacco: Never  Substance Use Topics   Alcohol use: Not Currently   FAMILY HX:  Family History  Problem  Relation Age of Onset   Prostate cancer Father    Prostate cancer Brother    Pancreatic cancer Brother       ALLERGIES:  Allergies  Allergen Reactions   Aggrenox [Aspirin-Dipyridamole Er] Nausea And Vomiting   Atorvastatin Other (See Comments)    Muscle pain   Azithromycin    Cefpodoxime Other (See Comments)    headache   Ciprofloxacin    Claritin [Loratadine] Other (See Comments)    Urinary retention   Contrast Media [Iodinated Diagnostic Agents] Hives   Doxazosin Other (See Comments)    Elevated blood pressure   Doxycycline    Equal [Aspartame] Diarrhea   Fenofibrate Other (See Comments)    Muscle pain   Flomax [Tamsulosin] Other (See Comments)    Elevated blood pressure   Flunisolide Other (See Comments)    Burning sensation   Fluticasone-Salmeterol     Throat  irriation   Fluvastatin Other (See Comments)    Muscle pain   Gemfibrozil Other (See Comments)    Muscle pain   Haemophilus Influenzae Vaccines Other (See Comments)    Chest pain   Hydrochlorothiazide Other (See Comments)    weakness   Levofloxacin Other (See Comments)    Nervousness, aggitation   Lisinopril Other (See Comments)    Chest pain   Moxifloxacin Other (See Comments)    Altered behavior   Niacin And Related Other (See Comments)    flushing   Oxycodone    Plavix [Clopidogrel] Other (See Comments)    Constriction in throat   Pravastatin Other (See Comments)    Elevated blood pressure   Simvastatin Other (See Comments)    Muscle pain   Terazosin Other (See Comments)    Fatigue    Zetia [Ezetimibe] Other (See Comments)    Edema    Amoxicillin Palpitations    Chest pain   Mometasone Palpitations    Elevated blood pressure   Sulfa Antibiotics Rash and Hypertension     PERTINENT MEDICATIONS:  Outpatient Encounter Medications as of 02/12/2021  Medication Sig   albuterol (VENTOLIN HFA) 108 (90 Base) MCG/ACT inhaler Inhale 2 puffs into the lungs every 6 (six) hours as needed for wheezing or shortness of breath.   bisacodyl (DULCOLAX) 10 MG suppository Place 10 mg rectally as needed for moderate constipation.   calamine lotion Apply 1 application topically as needed for itching.   cholecalciferol (VITAMIN D3) 25 MCG (1000 UNIT) tablet Take 1,000 Units by mouth daily.   dronabinol (MARINOL) 5 MG capsule Take 5 mg by mouth 2 (two) times daily before a meal.   epoetin alfa (EPOGEN) 10000 UNIT/ML injection Inject 1 mL (10,000 Units total) into the vein every Monday, Wednesday, and Friday with hemodialysis.   ferrous sulfate 325 (65 FE) MG tablet Take 1 tablet (325 mg total) by mouth daily with breakfast.   fluconazole (DIFLUCAN) 100 MG tablet Take 1 tablet (100 mg total) by mouth daily for 2 days.   fluticasone furoate-vilanterol (BREO ELLIPTA) 200-25 MCG/ACT AEPB Inhale 1  puff into the lungs daily.   ketotifen (ZADITOR) 0.025 % ophthalmic solution 1 drop 2 (two) times daily.   metoCLOPramide (REGLAN) 5 MG tablet Take 5 mg by mouth 4 (four) times daily -  before meals and at bedtime.   multivitamin (RENA-VIT) TABS tablet Take 1 tablet by mouth at bedtime.   Nutritional Supplements (FEEDING SUPPLEMENT, NEPRO CARB STEADY,) LIQD Take 237 mLs by mouth 3 (three) times daily between meals.   nystatin powder Apply 1 application  topically 2 (two) times daily.   omega-3 acid ethyl esters (LOVAZA) 1 g capsule Take 3 g by mouth 2 (two) times daily.   Omega-3 Fatty Acids (FISH OIL) 1000 MG CAPS TAKE 3 CAPSULES (SEA-OMEGA 30 FISH OIL) BY MOUTH TWO TIMES A DAY FOR CHOLESTEROL   pantoprazole (PROTONIX) 40 MG tablet 1 tablet daily.   polyethylene glycol (MIRALAX / GLYCOLAX) 17 g packet Take 17 g by mouth 2 (two) times daily.   rosuvastatin (CRESTOR) 40 MG tablet Take 40 mg by mouth daily.   tamsulosin (FLOMAX) 0.4 MG CAPS capsule Take 1 capsule (0.4 mg total) by mouth daily.   No facility-administered encounter medications on file as of 02/12/2021.   Thank you for the opportunity to participate in the care of Mr. Hardge.  The palliative care team will continue to follow. Please call our office at [phone] if we can be of additional assistance.   Jason Coop, NP , DNP, AGPCNP-BC  COVID-19 PATIENT SCREENING TOOL Asked and negative response unless otherwise noted:  Have you had symptoms of covid, tested positive or been in contact with someone with symptoms/positive test in the past 5-10 days?

## 2021-05-26 ENCOUNTER — Ambulatory Visit (INDEPENDENT_AMBULATORY_CARE_PROVIDER_SITE_OTHER): Payer: No Typology Code available for payment source | Admitting: Nurse Practitioner

## 2021-05-26 ENCOUNTER — Encounter (INDEPENDENT_AMBULATORY_CARE_PROVIDER_SITE_OTHER): Payer: Self-pay | Admitting: Nurse Practitioner

## 2021-05-26 ENCOUNTER — Other Ambulatory Visit: Payer: Self-pay

## 2021-05-26 VITALS — BP 133/70 | HR 69 | Resp 17

## 2021-05-26 DIAGNOSIS — Z992 Dependence on renal dialysis: Secondary | ICD-10-CM

## 2021-05-26 DIAGNOSIS — N186 End stage renal disease: Secondary | ICD-10-CM

## 2021-06-03 ENCOUNTER — Telehealth (INDEPENDENT_AMBULATORY_CARE_PROVIDER_SITE_OTHER): Payer: Self-pay

## 2021-06-03 NOTE — Telephone Encounter (Addendum)
I attempted to contact the patient to schedule him for a permcath removal. A message was left for a return call. Patient called back and is now scheduled with Dr. Lucky Cowboy for a permcath removal on 06/11/21 with a 8:15 am arrival time to the MM. Pre-procedure instructions were discussed and will be mailed. ?

## 2021-06-05 ENCOUNTER — Emergency Department
Admission: EM | Admit: 2021-06-05 | Discharge: 2021-06-05 | Disposition: A | Discharge status: Home / Self Care | Payer: No Typology Code available for payment source | Attending: Emergency Medicine | Admitting: Emergency Medicine

## 2021-06-05 ENCOUNTER — Emergency Department: Payer: No Typology Code available for payment source

## 2021-06-05 ENCOUNTER — Encounter: Payer: Self-pay | Admitting: Emergency Medicine

## 2021-06-05 ENCOUNTER — Other Ambulatory Visit: Payer: Self-pay

## 2021-06-05 DIAGNOSIS — N189 Chronic kidney disease, unspecified: Secondary | ICD-10-CM | POA: Insufficient documentation

## 2021-06-05 DIAGNOSIS — J449 Chronic obstructive pulmonary disease, unspecified: Secondary | ICD-10-CM | POA: Diagnosis not present

## 2021-06-05 DIAGNOSIS — W19XXXA Unspecified fall, initial encounter: Secondary | ICD-10-CM

## 2021-06-05 DIAGNOSIS — S0081XA Abrasion of other part of head, initial encounter: Secondary | ICD-10-CM

## 2021-06-05 DIAGNOSIS — W01198A Fall on same level from slipping, tripping and stumbling with subsequent striking against other object, initial encounter: Secondary | ICD-10-CM | POA: Diagnosis not present

## 2021-06-05 DIAGNOSIS — I130 Hypertensive heart and chronic kidney disease with heart failure and stage 1 through stage 4 chronic kidney disease, or unspecified chronic kidney disease: Secondary | ICD-10-CM | POA: Diagnosis not present

## 2021-06-05 DIAGNOSIS — I509 Heart failure, unspecified: Secondary | ICD-10-CM | POA: Diagnosis not present

## 2021-06-05 DIAGNOSIS — S0993XA Unspecified injury of face, initial encounter: Secondary | ICD-10-CM | POA: Diagnosis present

## 2021-06-05 IMAGING — CT CT MAXILLOFACIAL W/O CM
3 series · 16 of 47 positions shown, 19 images · non-contrast
Comparison: Head CT [DATE]

CLINICAL DATA: Blunt facial trauma.

EXAM:
CT HEAD WITHOUT CONTRAST
CT MAXILLOFACIAL WITHOUT CONTRAST
TECHNIQUE: Multidetector CT imaging of the head and maxillofacial structures
were performed using the standard protocol without intravenous
contrast. Multiplanar CT image reconstructions of the maxillofacial
structures were also generated.
RADIATION DOSE REDUCTION: This exam was performed according to the
departmental dose-optimization program which includes automated
exposure control, adjustment of the mA and/or kV according to
patient size and/or use of iterative reconstruction technique.

[Series 2: max soft · axial · 0.34mm/px · z∈[-249,-101]mm · 10 of 86 slices shown, 13 images]
[im 6/86  brain]
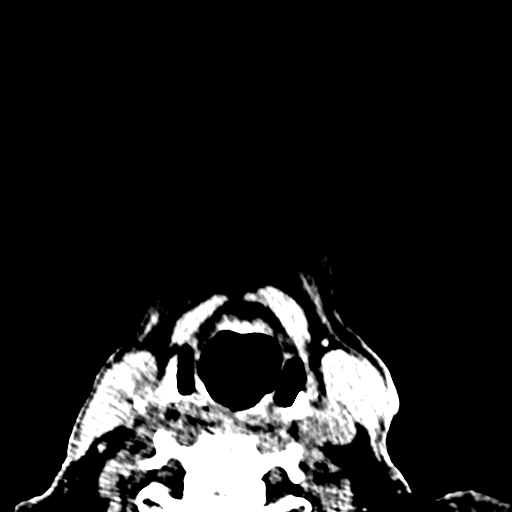
[im 6/86  bone]
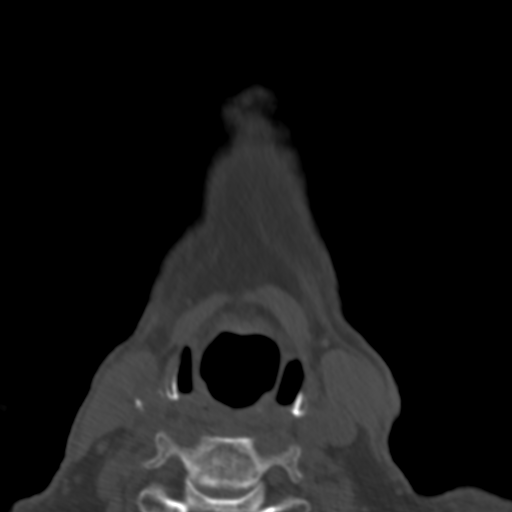
[im 15/86  bone]
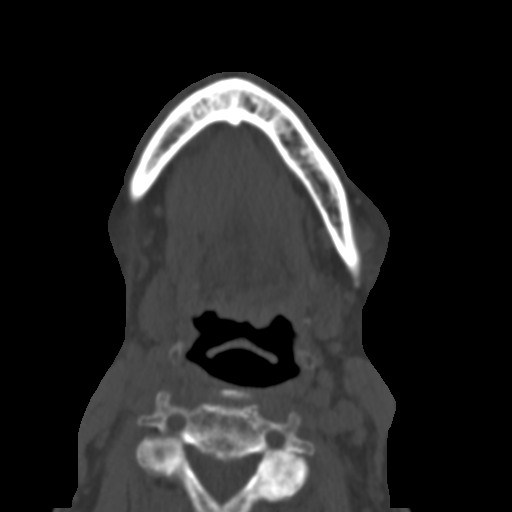
[im 24/86  bone]
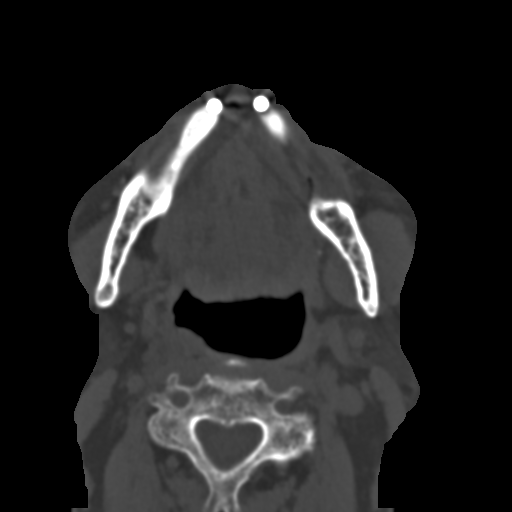
[im 30/86  bone]
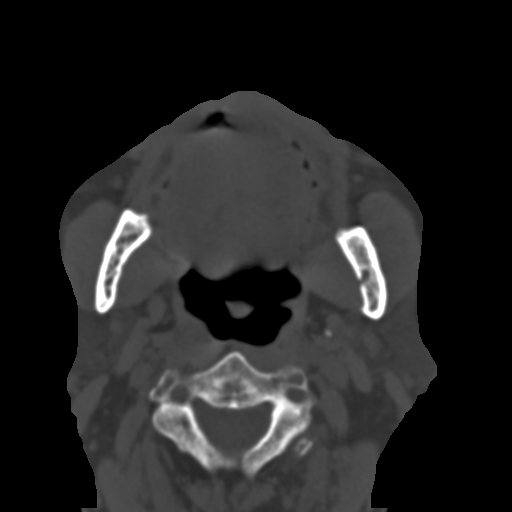
[im 39/86  brain]
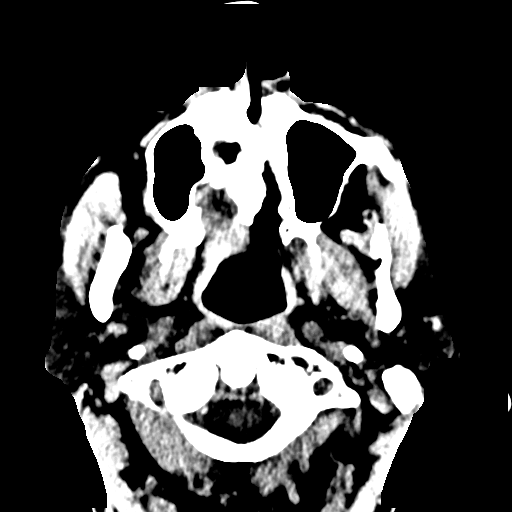
[im 39/86  bone]
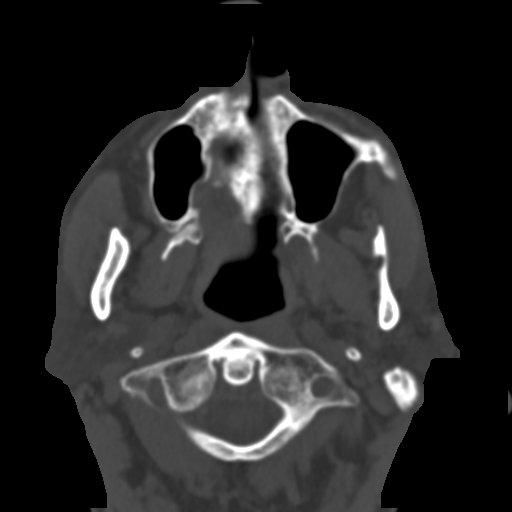
[im 47/86  bone]
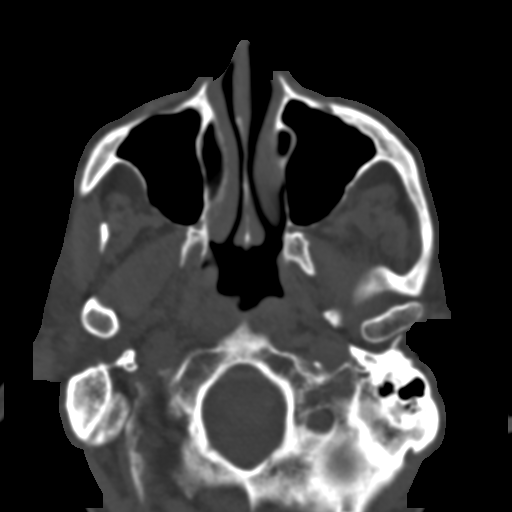
[im 56/86  bone]
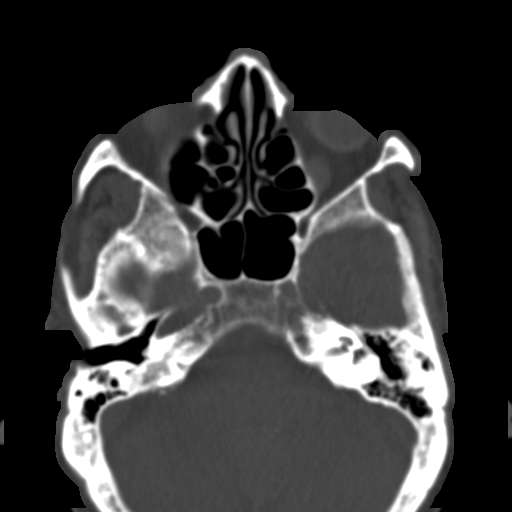
[im 65/86  bone]
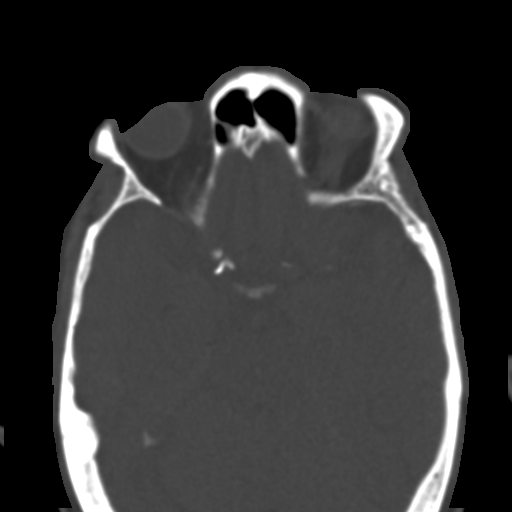
[im 71/86  brain]
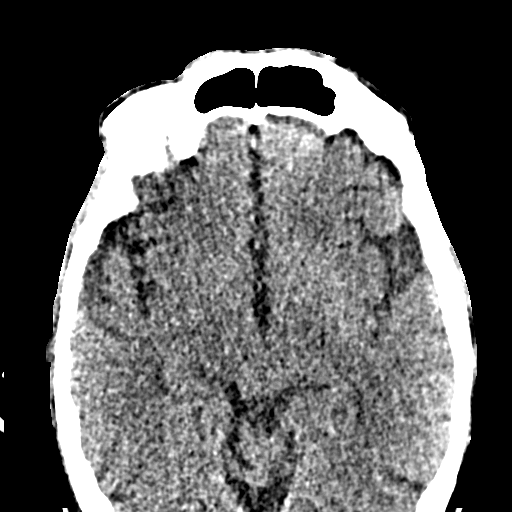
[im 71/86  bone]
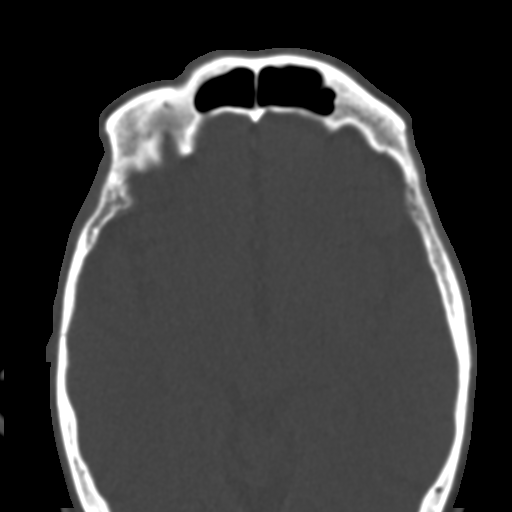
[im 80/86  bone]
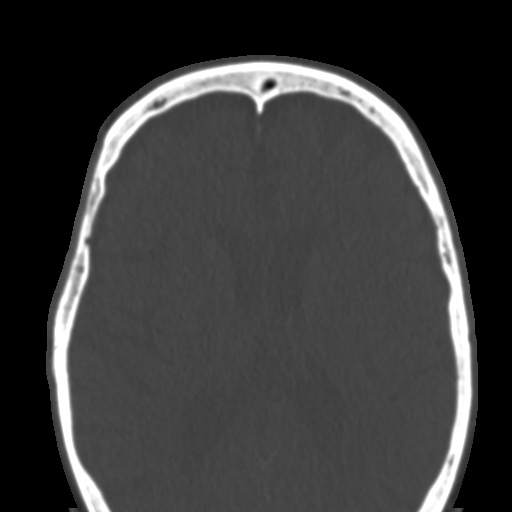

[Series 6: coronal soft · coronal · 0.33mm/px · 3 of 88 slices shown]
[im 30/88  bone]
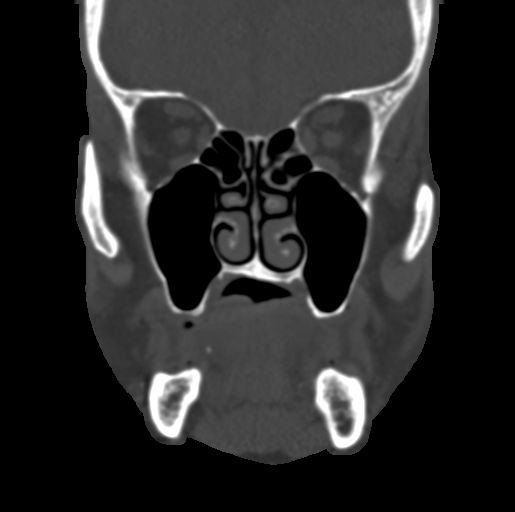
[im 39/88  bone]
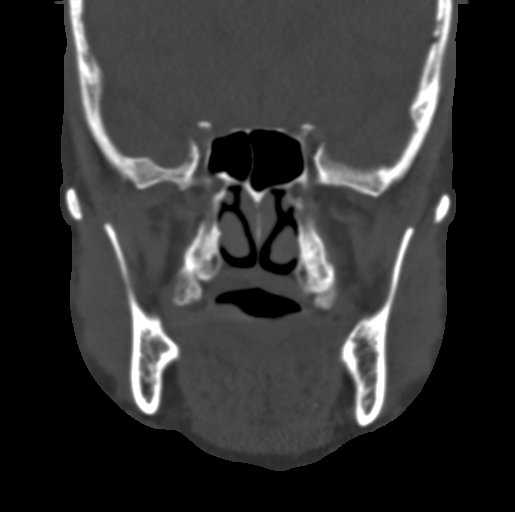
[im 49/88  bone]
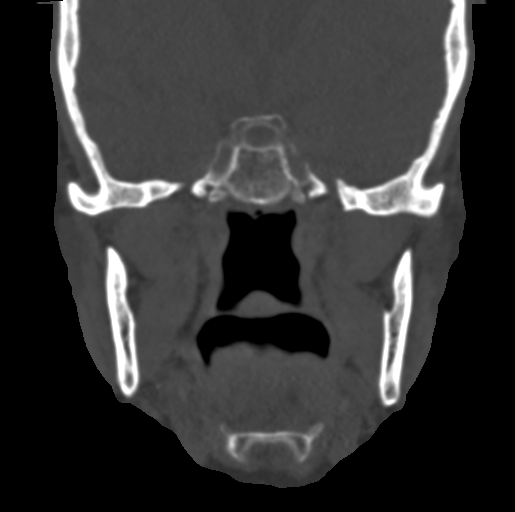

[Series 7: sagittal soft · sagittal · 0.33mm/px · 3 of 81 slices shown]
[im 27/81  bone]
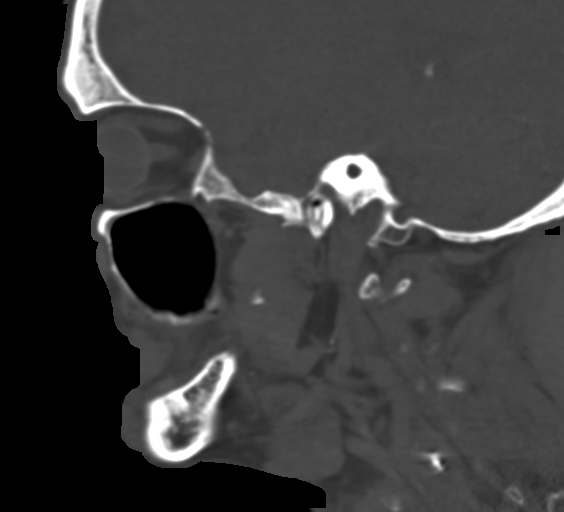
[im 41/81  bone]
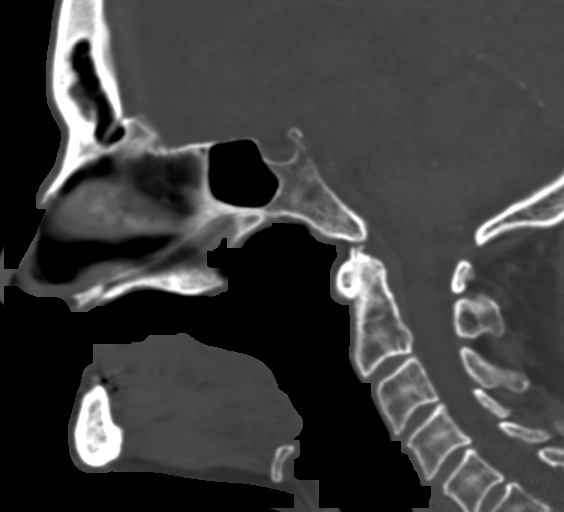
[im 54/81  bone]
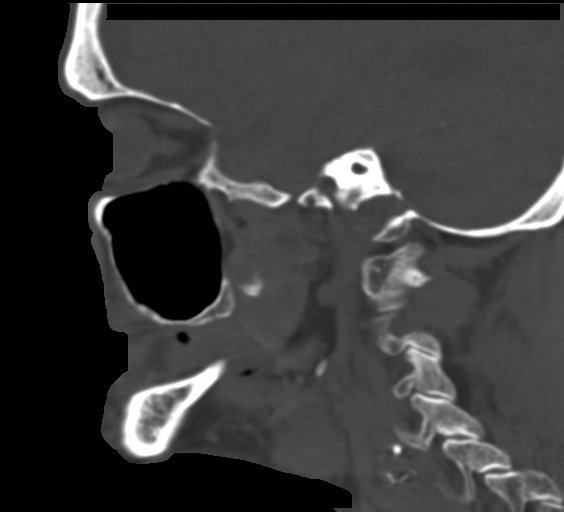

[16 of 47 positions shown; findings below may reference images not displayed]

FINDINGS: CT HEAD FINDINGS

Brain: No evidence of acute infarction, hemorrhage, hydrocephalus,
extra-axial collection or mass lesion/mass effect. Mild for age
cerebral volume loss. Small remote left cerebellar infarct

Vascular: No hyperdense vessel or unexpected calcification.

Skull: Negative for fracture.  Small left anterior scalp scarring.

CT MAXILLOFACIAL FINDINGS

Osseous: No fracture or mandibular dislocation. No destructive
process. Edentulous with mandibular dental implants

Orbits: No evidence of injury.  Bilateral cataract resection

Sinuses: Negative for hemosinus.

Soft tissues: No hematoma or foreign body seen.
IMPRESSION: No evidence of intracranial injury or facial fracture.

## 2021-06-05 IMAGING — CT CT HEAD W/O CM
4 series · 16 of 47 positions shown, 18 images · non-contrast
Comparison: Head CT [DATE]

CLINICAL DATA: Blunt facial trauma.

EXAM:
CT HEAD WITHOUT CONTRAST
CT MAXILLOFACIAL WITHOUT CONTRAST
TECHNIQUE: Multidetector CT imaging of the head and maxillofacial structures
were performed using the standard protocol without intravenous
contrast. Multiplanar CT image reconstructions of the maxillofacial
structures were also generated.
RADIATION DOSE REDUCTION: This exam was performed according to the
departmental dose-optimization program which includes automated
exposure control, adjustment of the mA and/or kV according to
patient size and/or use of iterative reconstruction technique.

[Series 2: head wo · axial · 0.46mm/px · z∈[-140,-20]mm · 7 of 33 slices shown, 9 images]
[im 5/33  brain]
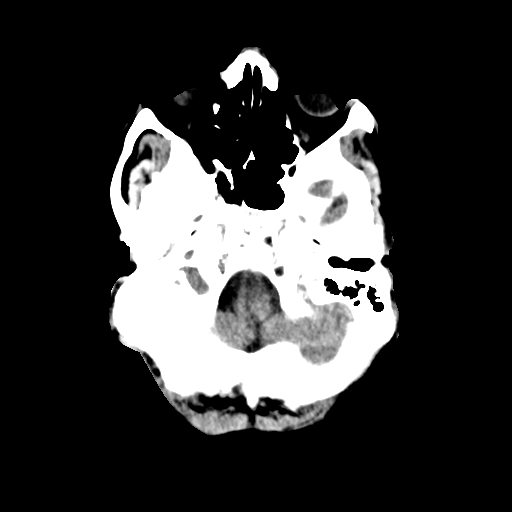
[im 5/33  bone]
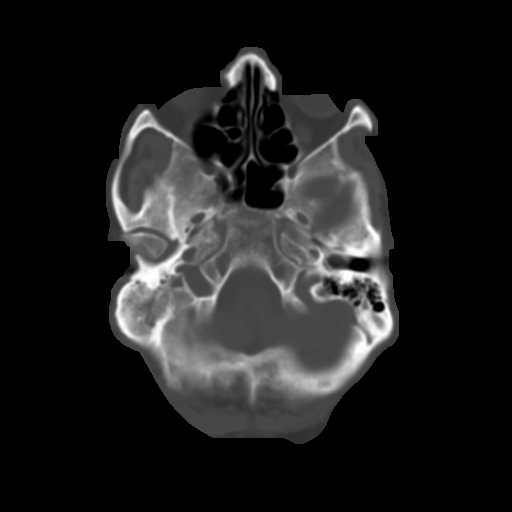
[im 9/33  brain]
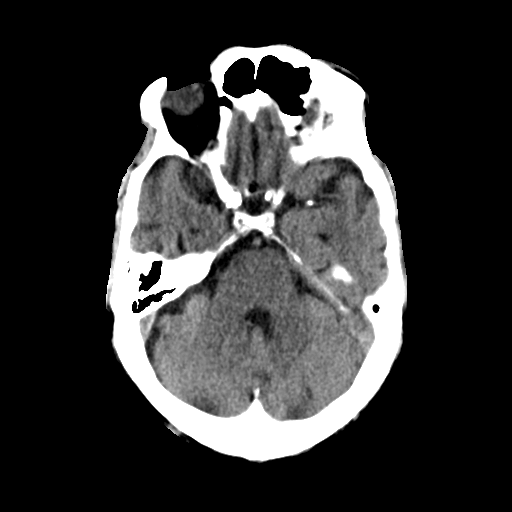
[im 13/33  brain]
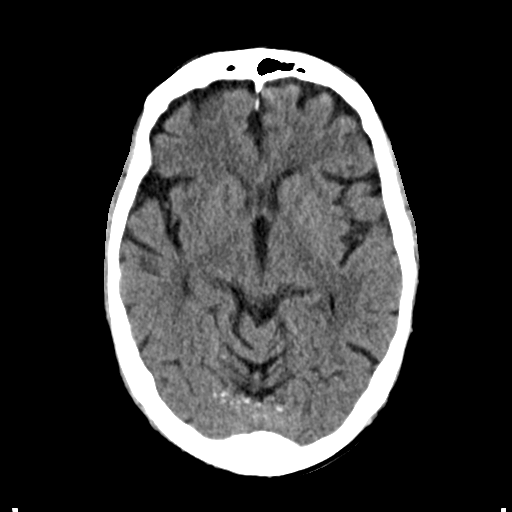
[im 17/33  brain]
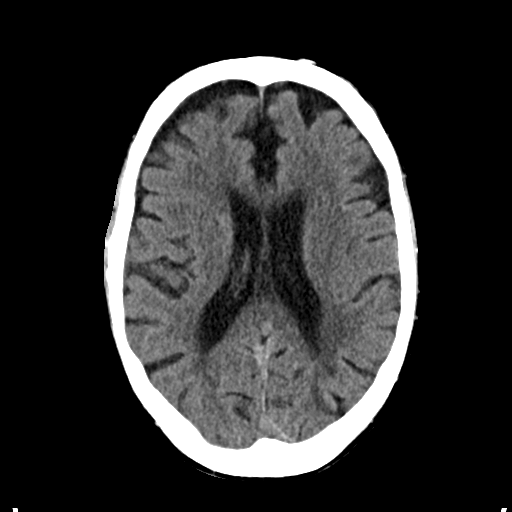
[im 21/33  brain]
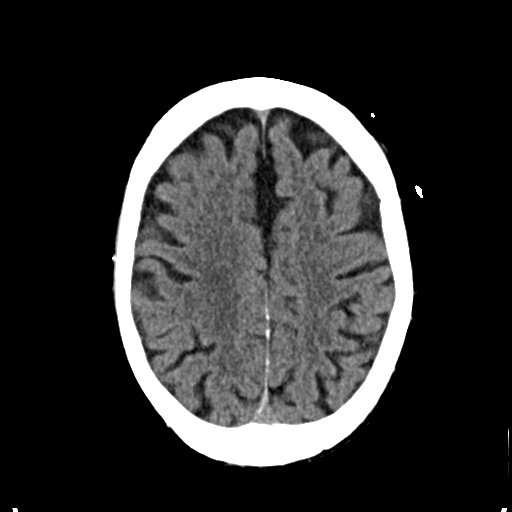
[im 21/33  bone]
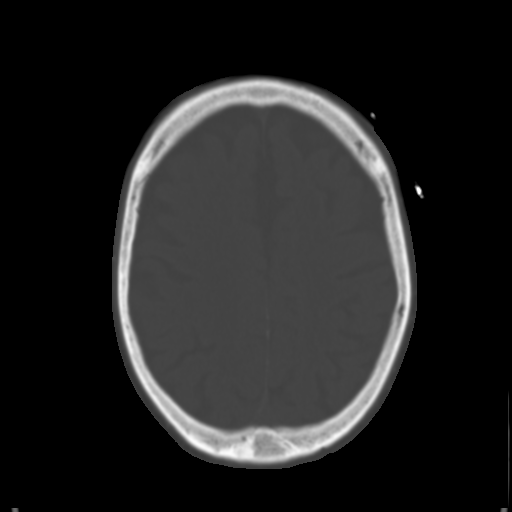
[im 25/33  brain]
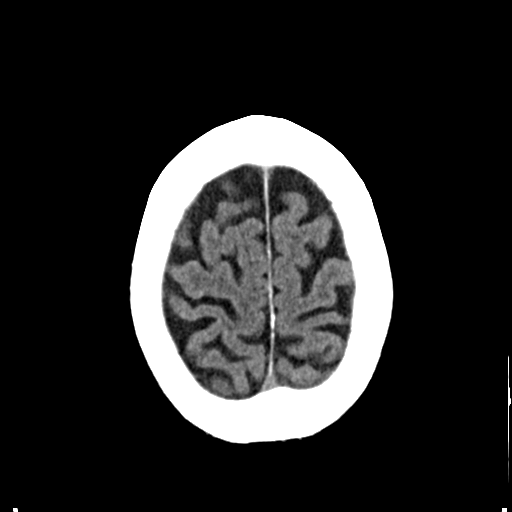
[im 29/33  brain]
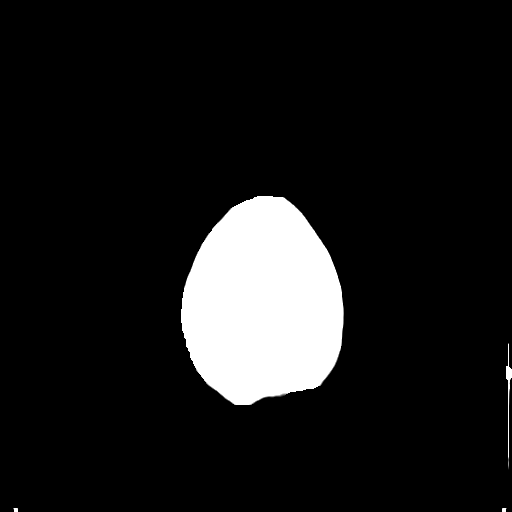

[Series 3: head bone · axial · 0.46mm/px · z∈[-144,-112]mm · 3 of 83 slices shown]
[im 9/83  bone]
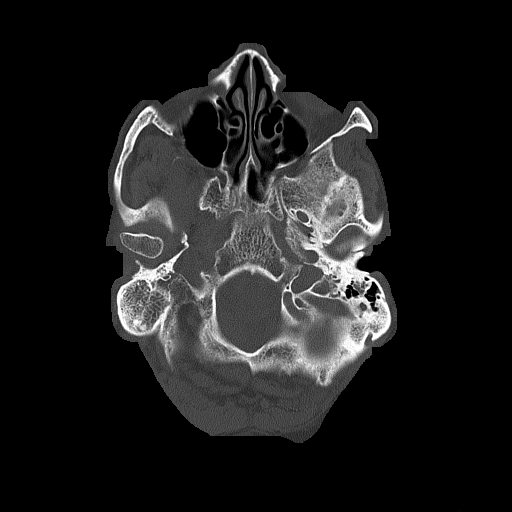
[im 17/83  bone]
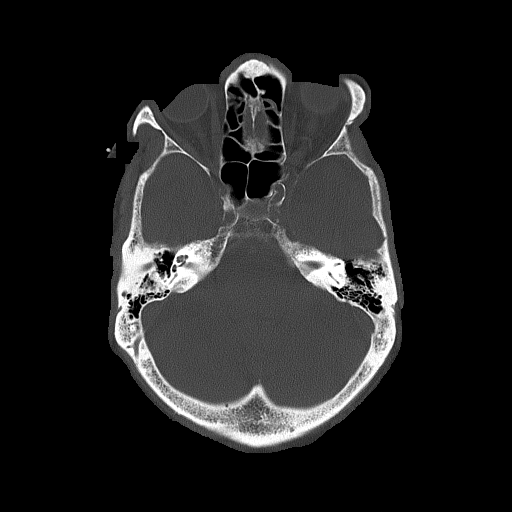
[im 25/83  bone]
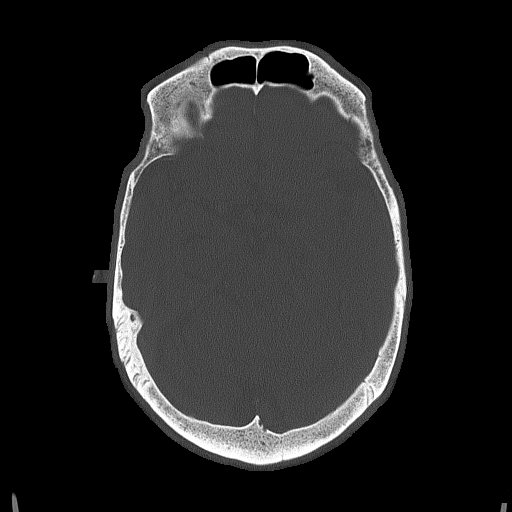

[Series 4: coronal soft tissue · coronal · 0.32mm/px · 3 of 70 slices shown]
[im 24/70  brain]
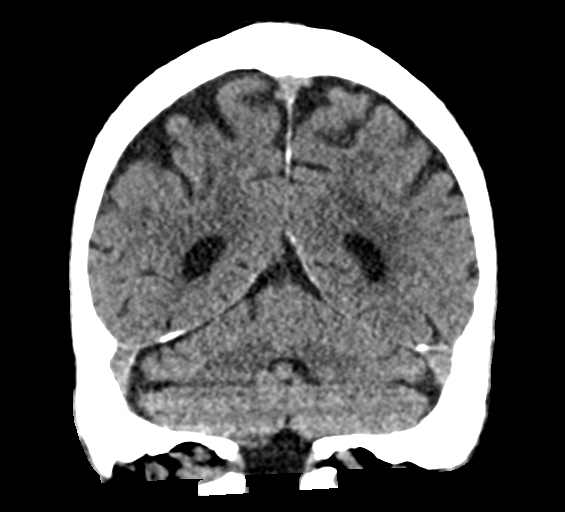
[im 31/70  brain]
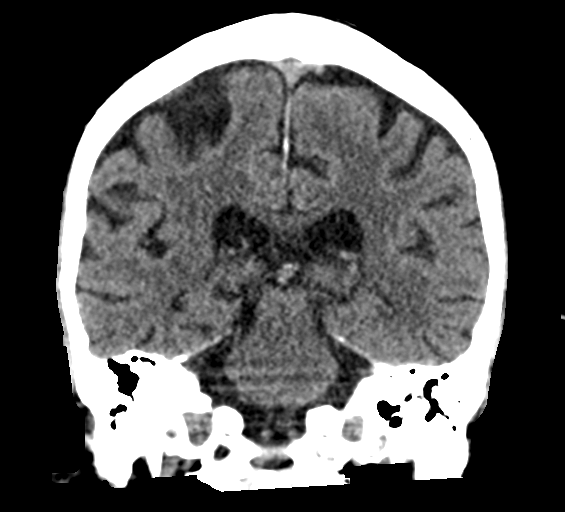
[im 39/70  brain]
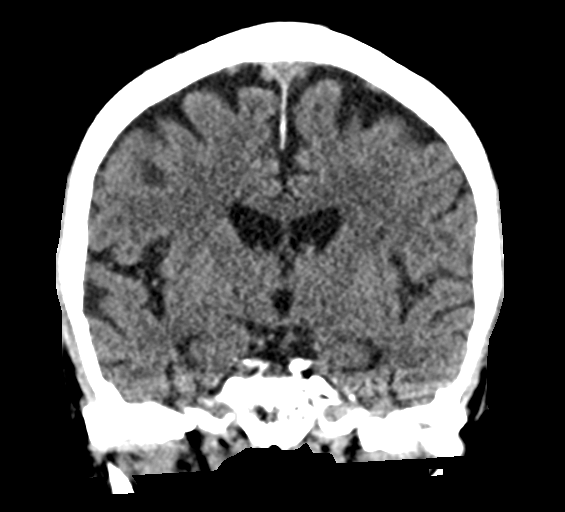

[Series 5: sagittal soft tissue · sagittal · 0.33mm/px · 3 of 58 slices shown]
[im 20/58  brain]
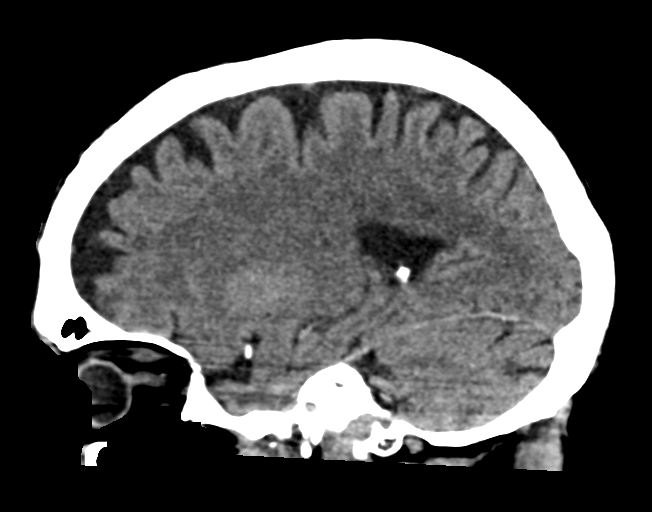
[im 29/58  brain]
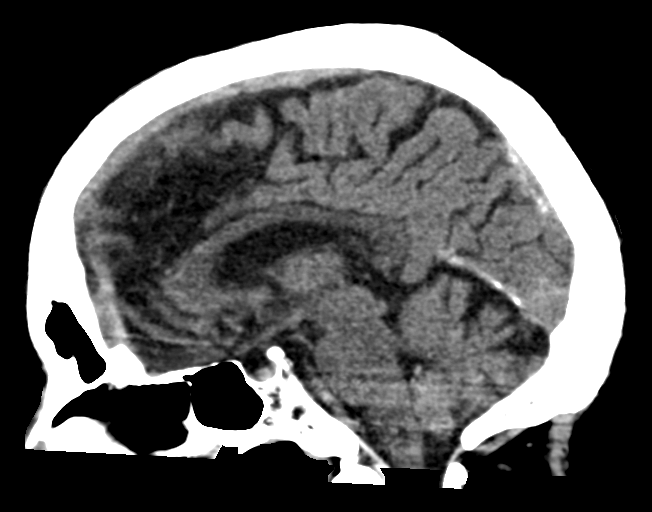
[im 39/58  brain]
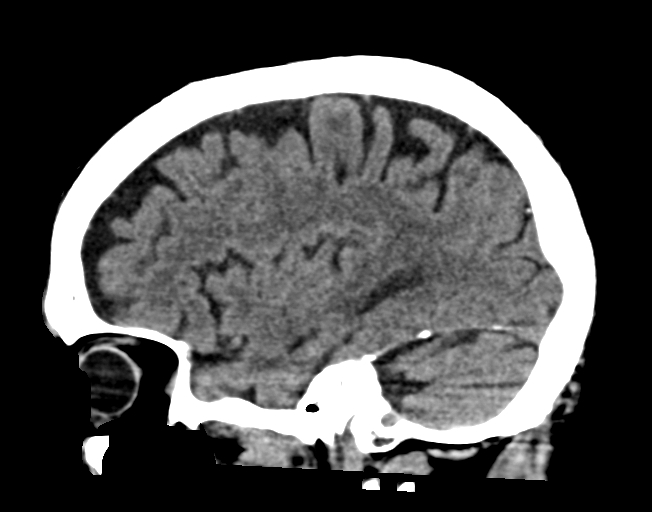

[16 of 47 positions shown; findings below may reference images not displayed]

FINDINGS: CT HEAD FINDINGS

Brain: No evidence of acute infarction, hemorrhage, hydrocephalus,
extra-axial collection or mass lesion/mass effect. Mild for age
cerebral volume loss. Small remote left cerebellar infarct

Vascular: No hyperdense vessel or unexpected calcification.

Skull: Negative for fracture.  Small left anterior scalp scarring.

CT MAXILLOFACIAL FINDINGS

Osseous: No fracture or mandibular dislocation. No destructive
process. Edentulous with mandibular dental implants

Orbits: No evidence of injury.  Bilateral cataract resection

Sinuses: Negative for hemosinus.

Soft tissues: No hematoma or foreign body seen.
IMPRESSION: No evidence of intracranial injury or facial fracture.

## 2021-06-05 NOTE — ED Provider Notes (Signed)
? ?[facility] ?Provider Note ? ? ? Event Date/Time  ? First MD Initiated Contact with Patient 06/05/21 0434   ?  (approximate) ? ? ?History  ? ?No chief complaint on file. ? ? ?HPI ? ?Phillip Chavez is a 81 y.o. male with a history of chronic kidney disease, CHF, COPD, hypertension and hyperlipidemia who presents for evaluation after mechanical fall.  Patient reports that he got up to go to the thermostat because it was cold when he tripped on the carpet and fell.  He hit his face on the floor.  No LOC.  He does not take any blood thinners.  He denies any headache, face pain, neck pain, chest pain, abdominal pain or extremity pain.  The only reason why he is here today is because he does have a dialysis catheter on the right chest wall and he just wanted to make sure that that looked okay.  The catheter supposed to be removed next week since patient is no longer on dialysis.  Patient reports that when he got up to go to the thermostat today he felt "good" with no medical complaints.  He denies feeling dizzy and reports that the fall was mechanical in nature after tripping on the rug ?  ? ? ?Past Medical History:  ?Diagnosis Date  ? Chronic CHF (congestive heart failure) (HCC)   ? COPD (chronic obstructive pulmonary disease) ([place])   ? GERD (gastroesophageal reflux disease)   ? HLD (hyperlipidemia)   ? HTN (hypertension)   ? ? ?Past Surgical History:  ?Procedure Laterality Date  ? dental procedure    ? DIALYSIS/PERMA CATHETER INSERTION N/A 01/20/2021  ? Procedure: DIALYSIS/PERMA CATHETER INSERTION;  Surgeon: Algernon Huxley, MD;  Location: [facility];  Service: Cardiovascular;  Laterality: N/A;  ? TEE WITHOUT CARDIOVERSION N/A 01/06/2021  ? Procedure: TRANSESOPHAGEAL ECHOCARDIOGRAM (TEE);  Surgeon: Minna Merritts, MD;  Location: [facility];  Service: Cardiovascular;  Laterality: N/A;  ? ? ? ?Physical Exam  ? ?Triage Vital Signs: ?ED Triage Vitals [06/05/21 0431]  ?Enc Vitals Group  ?   BP    ?   Pulse   ?   Resp   ?   Temp   ?   Temp src   ?   SpO2   ?   Weight 143 lb (64.9 kg)  ?   Height 5\' 4"  (1.626 m)  ?   Head Circumference   ?   [place] Flow   ?   Pain Score 0  ?   Pain Loc   ?   Pain Edu?   ?   Excl. in [place]?   ? ? ?Most recent vital signs: ?Vitals:  ? 06/05/21 0530 06/05/21 0534  ?BP: (!) 145/74 (!) 145/74  ?Pulse: (!) 54 60  ?Resp:  14  ?Temp:  98 ?F (36.7 ?C)  ?SpO2: 99% 99%  ? ? ? ?Full spinal precautions maintained throughout the trauma exam. ?Constitutional: Alert and oriented. No acute distress. Does not appear intoxicated. ?HEENT ?Head: Normocephalic and atraumatic. ?Face: No facial bony tenderness. Stable midface.  Abrasions to the forehead and nose ?Ears: No hemotympanum bilaterally. No Battle sign ?Eyes: No eye injury. PERRL. No raccoon eyes ?Nose: Nontender. No epistaxis. No rhinorrhea ?Mouth/Throat: Mucous membranes are moist. No oropharyngeal blood. No dental injury. Airway patent without stridor. Normal voice. ?Neck: no C-collar. No midline c-spine tenderness.  ?Cardiovascular: Normal rate, regular rhythm. Normal and symmetric distal pulses are present in all extremities. ?Pulmonary/Chest:  Chest wall is stable and nontender to palpation/compression. Normal respiratory effort. Breath sounds are normal. No crepitus.  ?Abdominal: Soft, nontender, non distended. ?Musculoskeletal: Nontender with normal full range of motion in all extremities. No deformities. No thoracic or lumbar midline spinal tenderness. Pelvis is stable. ?Skin: Skin is warm, dry and intact. No abrasions or contutions. ?Psychiatric: Speech and behavior are appropriate. ?Neurological: Normal speech and language. Moves all extremities to command. No gross focal neurologic deficits are appreciated. ? ?Glascow Coma Score: ?4 - Opens eyes on own ?6 - Follows simple motor commands ?5 - Alert and oriented ?GCS: 15 ? ?ED Results / Procedures / Treatments  ? ?Labs ?(all labs ordered are listed, but only abnormal results are  displayed) ?Labs Reviewed - No data to display ? ? ?EKG ? ?none ? ? ?RADIOLOGY ?I, Rudene Re, attending MD, have personally viewed and interpreted the images obtained during this visit as below: ? ?CTs negative for traumatic injury ? ? ?___________________________________________________ ?Interpretation by Radiologist:  ?CT Head Wo Contrast ? ?Result Date: 06/05/2021 ?CLINICAL DATA:  Blunt facial trauma. EXAM: CT HEAD WITHOUT CONTRAST CT MAXILLOFACIAL WITHOUT CONTRAST TECHNIQUE: Multidetector CT imaging of the head and maxillofacial structures were performed using the standard protocol without intravenous contrast. Multiplanar CT image reconstructions of the maxillofacial structures were also generated. RADIATION DOSE REDUCTION: This exam was performed according to the departmental dose-optimization program which includes automated exposure control, adjustment of the mA and/or kV according to patient size and/or use of iterative reconstruction technique. COMPARISON:  Head CT 02/05/2021 FINDINGS: CT HEAD FINDINGS Brain: No evidence of acute infarction, hemorrhage, hydrocephalus, extra-axial collection or mass lesion/mass effect. Mild for age cerebral volume loss. Small remote left cerebellar infarct Vascular: No hyperdense vessel or unexpected calcification. Skull: Negative for fracture.  Small left anterior scalp scarring. CT MAXILLOFACIAL FINDINGS Osseous: No fracture or mandibular dislocation. No destructive process. Edentulous with mandibular dental implants Orbits: No evidence of injury.  Bilateral cataract resection Sinuses: Negative for hemosinus. Soft tissues: No hematoma or foreign body seen. IMPRESSION: No evidence of intracranial injury or facial fracture. Electronically Signed   By: Jorje Guild M.D.   On: 06/05/2021 05:20   ? ? ? ?PROCEDURES: ? ?Critical Care performed: No ? ?Procedures ? ? ? ?IMPRESSION / MDM / ASSESSMENT AND PLAN / ED COURSE  ?I reviewed the triage vital signs and the nursing  notes. ? ?81 y.o. male with a history of chronic kidney disease, CHF, COPD, hypertension and hyperlipidemia who presents for evaluation after mechanical fall after tripping on a rug.  Patient denies any complaints at this time.  He reports that the only reason why he is here is to make sure that the dialysis catheter in his chest wall was not damaged on the fall.  He does not use the catheter anymore and it is scheduled to have it removed in a week.  He does have abrasions to the forehead and the nose.  Denies any facial pain, no tenderness to palpation, no spine tenderness, head is otherwise atraumatic, chest and abdomen are atraumatic, patient has full painless range of motion of all extremities.  Based on patient's age and the fact that he hit his head on the floor I will get a CT head and face.  He has no pain or spine tenderness therefore will not get a CT cervical spine.  Offered Tylenol but patient declined because he has no pain.  Catheter on the chest wall looks intact with no bleeding and in place.  No indication for labs ? ?MEDICATIONS GIVEN IN ED: ?Medications - No data to display ? ? ?ED COURSE: Imaging negative for traumatic injury.  Patient ambulating with no difficulty.  Will discharge home to the care of his wife and family.  Patient scheduled to have catheter removed next week.  Discussed my standard return precautions. ? ? ?Consults: none ? ? ?EMR reviewed including last visit with his nephrologist for his kidney dysfunction from a week ago ? ? ? ?FINAL CLINICAL IMPRESSION(S) / ED DIAGNOSES  ? ?Final diagnoses:  ?Fall, initial encounter  ?Abrasion of face, initial encounter  ? ? ? ?Rx / DC Orders  ? ?ED Discharge Orders   ? ? None  ? ?  ? ? ? ?Note:  This document was prepared using Dragon voice recognition software and may include unintentional dictation errors. ? ? ?Please note:  Patient was evaluated in Emergency Department today for the symptoms described in the history of present illness.  Patient was evaluated in the context of the global COVID-19 pandemic, which necessitated consideration that the patient might be at risk for infection with the SARS-CoV-2 virus that causes COVID-19. [facility]

## 2021-06-05 NOTE — Discharge Instructions (Signed)

## 2021-06-05 NOTE — ED Triage Notes (Addendum)
Pt st tripped and fell PTA, landing on carpet floor; abrasion noted to forehead and bridge of nose; denies c/o but st he has a rt sided dialysis cath to left side chest and was concerned he may have "broke it"; cath appears in place with no bleeding or redness around site and no tenderness; shunt due to be removed next Thurs and has not been used in 3wks ?

## 2021-06-07 ENCOUNTER — Encounter (INDEPENDENT_AMBULATORY_CARE_PROVIDER_SITE_OTHER): Payer: Self-pay | Admitting: Nurse Practitioner

## 2021-06-07 NOTE — Progress Notes (Signed)
? ?Subjective:  ? ? Patient ID: Phillip Chavez, male    DOB: 03-27-1940, 81 y.o.   MRN: [id number] ?Chief Complaint  ?Patient presents with  ? Establish Care  ? ? ?Phillip Chavez is an 81 year old male that presents today as a referral from Dr. Zollie Scale for evaluation regarding his chronic kidney disease.  It is noticed that the patient recently discussed removing his PermCath with Dr. Zollie Scale as his renal function has improved.  This is confirmed today by the patient's son.  The need for his PermCath and dialysis came due to acute kidney injury during recent hospitalization.  Since the patient has not been on dialysis in the last several weeks he notes that he has not had any uremic symptoms of chest pain or shortness of breath. ? ? ?Review of Systems  ?Cardiovascular:  Negative for leg swelling.  ?All other systems reviewed and are negative. ? ?   ?Objective:  ? Physical Exam ?Vitals reviewed.  ?HENT:  ?   Head: Normocephalic.  ?Cardiovascular:  ?   Rate and Rhythm: Normal rate.  ?   Pulses: Normal pulses.  ?Pulmonary:  ?   Effort: Pulmonary effort is normal.  ?Skin: ?   General: Skin is warm and dry.  ?Neurological:  ?   Mental Status: He is alert and oriented to person, place, and time.  ?Psychiatric:     ?   Mood and Affect: Mood normal.     ?   Behavior: Behavior normal.     ?   Thought Content: Thought content normal.     ?   Judgment: Judgment normal.  ? ? ?BP 133/70 (BP Location: Right Arm)   Pulse 69   Resp 17  ? ?Past Medical History:  ?Diagnosis Date  ? Chronic CHF (congestive heart failure) (HCC)   ? COPD (chronic obstructive pulmonary disease) ([place])   ? GERD (gastroesophageal reflux disease)   ? HLD (hyperlipidemia)   ? HTN (hypertension)   ? ? ?Social History  ? ?Socioeconomic History  ? Marital status: Married  ?  Spouse name: Not on file  ? Number of children: Not on file  ? Years of education: Not on file  ? Highest education level: Not on file  ?Occupational History  ? Not on file  ?Tobacco Use  ?  Smoking status: Former  ?  Packs/day: 1.00  ?  Years: 20.00  ?  Pack years: 20.00  ?  Types: Cigarettes  ?  Quit date: 03/21/1978  ?  Years since quitting: 43.2  ? Smokeless tobacco: Never  ?Vaping Use  ? Vaping Use: Never used  ?Substance and Sexual Activity  ? Alcohol use: Not Currently  ? Drug use: Never  ? Sexual activity: Not on file  ?Other Topics Concern  ? Not on file  ?Social History Narrative  ? Not on file  ? ?Social Determinants of Health  ? ?Financial Resource Strain: Not on file  ?Food Insecurity: Not on file  ?Transportation Needs: Not on file  ?Physical Activity: Not on file  ?Stress: Not on file  ?Social Connections: Not on file  ?Intimate Partner Violence: Not on file  ? ? ?Past Surgical History:  ?Procedure Laterality Date  ? dental procedure    ? DIALYSIS/PERMA CATHETER INSERTION N/A 01/20/2021  ? Procedure: DIALYSIS/PERMA CATHETER INSERTION;  Surgeon: Algernon Huxley, MD;  Location: [facility];  Service: Cardiovascular;  Laterality: N/A;  ? TEE WITHOUT CARDIOVERSION N/A 01/06/2021  ? Procedure: TRANSESOPHAGEAL ECHOCARDIOGRAM (TEE);  Surgeon: Minna Merritts, MD;  Location: [facility];  Service: Cardiovascular;  Laterality: N/A;  ? ? ?Family History  ?Problem Relation Age of Onset  ? Prostate cancer Father   ? Prostate cancer Brother   ? Pancreatic cancer Brother   ? ? ?Allergies  ?Allergen Reactions  ? Aggrenox [Aspirin-Dipyridamole Er] Nausea And Vomiting  ? Atorvastatin Other (See Comments)  ?  Muscle pain  ? Azithromycin   ? Cefpodoxime Other (See Comments)  ?  headache  ? Ciprofloxacin   ? Claritin [Loratadine] Other (See Comments)  ?  Urinary retention  ? Contrast Media [Iodinated Contrast Media] Hives  ? Doxazosin Other (See Comments)  ?  Elevated blood pressure  ? Doxycycline   ? Equal [Aspartame] Diarrhea  ? Fenofibrate Other (See Comments)  ?  Muscle pain  ? Flomax [Tamsulosin] Other (See Comments)  ?  Elevated blood pressure  ? Flunisolide Other (See Comments)  ?  Burning  sensation  ? Fluticasone-Salmeterol   ?  Throat irriation  ? Fluvastatin Other (See Comments)  ?  Muscle pain  ? Gemfibrozil Other (See Comments)  ?  Muscle pain  ? Haemophilus Influenzae Vaccines Other (See Comments)  ?  Chest pain  ? Hydrochlorothiazide Other (See Comments)  ?  weakness  ? Levofloxacin Other (See Comments)  ?  Nervousness, aggitation  ? Lisinopril Other (See Comments)  ?  Chest pain  ? Moxifloxacin Other (See Comments)  ?  Altered behavior  ? Niacin And Related Other (See Comments)  ?  flushing  ? Oxycodone   ? Plavix [Clopidogrel] Other (See Comments)  ?  Constriction in throat  ? Pravastatin Other (See Comments)  ?  Elevated blood pressure  ? Simvastatin Other (See Comments)  ?  Muscle pain  ? Terazosin Other (See Comments)  ?  Fatigue ?  ? Zetia [Ezetimibe] Other (See Comments)  ?  Edema ?  ? Amoxicillin Palpitations  ?  Chest pain  ? Mometasone Palpitations  ?  Elevated blood pressure  ? Sulfa Antibiotics Rash and Hypertension  ? ? ? ?  Latest Ref Rng & Units 02/09/2021  ?  4:35 AM 02/08/2021  ?  4:27 AM 02/07/2021  ?  5:24 AM  ?CBC  ?WBC 4.0 - 10.5 K/uL 9.0   7.4   10.5    ?Hemoglobin 13.0 - 17.0 g/dL 7.3   6.7   7.4    ?Hematocrit 39.0 - 52.0 % 22.7   20.9   23.3    ?Platelets 150 - 400 K/uL 129   130   160    ? ? ? ? ?CMP  ?   ?Component Value Date/Time  ? NA 132 (L) 02/09/2021 0435  ? NA 139 02/01/2013 0302  ? K 3.9 02/09/2021 0435  ? K 4.3 02/01/2013 0302  ? CL 96 (L) 02/09/2021 0435  ? CL 105 02/01/2013 0302  ? CO2 28 02/09/2021 0435  ? CO2 29 02/01/2013 0302  ? GLUCOSE 107 (H) 02/09/2021 0435  ? GLUCOSE 99 02/01/2013 0302  ? BUN 43 (H) 02/09/2021 0435  ? BUN 23 (H) 02/01/2013 0302  ? CREATININE 4.63 (H) 02/09/2021 0435  ? CREATININE 1.17 02/01/2013 0302  ? CALCIUM 7.6 (L) 02/09/2021 0435  ? CALCIUM 8.8 02/01/2013 0302  ? PROT 6.1 (L) 02/05/2021 0130  ? ALBUMIN 2.0 (L) 02/05/2021 0130  ? AST 66 (H) 02/05/2021 0130  ? ALT 112 (H) 02/05/2021 0130  ? ALKPHOS 94 02/05/2021 0130  ? BILITOT  0.7 02/05/2021  0130  ? GFRNONAA 12 (L) 02/09/2021 0435  ? GFRNONAA >60 02/01/2013 0302  ? GFRAA >60 02/01/2013 0302  ? ? ? ?No results found. ? ?   ?Assessment & Plan:  ? ?1. ESRD on hemodialysis ([place]) ?Following discussion with the patient and his son, it seems that the PermCath is no longer necessary.  We will reach out to his dialysis center to obtain orders to remove the PermCath.  We will arrange that PermCath removal once we receive orders. ? ? ?Current Outpatient Medications on File Prior to Visit  ?Medication Sig Dispense Refill  ? albuterol (VENTOLIN HFA) 108 (90 Base) MCG/ACT inhaler Inhale 2 puffs into the lungs every 6 (six) hours as needed for wheezing or shortness of breath.    ? cholecalciferol (VITAMIN D3) 25 MCG (1000 UNIT) tablet Take 1,000 Units by mouth daily.    ? dronabinol (MARINOL) 5 MG capsule Take 5 mg by mouth 2 (two) times daily before a meal.    ? ferrous sulfate 325 (65 FE) MG tablet Take 1 tablet (325 mg total) by mouth daily with breakfast.  3  ? fluticasone furoate-vilanterol (BREO ELLIPTA) 200-25 MCG/ACT AEPB Inhale 1 puff into the lungs daily.    ? ketotifen (ZADITOR) 0.025 % ophthalmic solution 1 drop 2 (two) times daily.    ? multivitamin (RENA-VIT) TABS tablet Take 1 tablet by mouth at bedtime.  0  ? Nutritional Supplements (FEEDING SUPPLEMENT, NEPRO CARB STEADY,) LIQD Take 237 mLs by mouth 3 (three) times daily between meals.  0  ? omega-3 acid ethyl esters (LOVAZA) 1 g capsule Take 3 g by mouth 2 (two) times daily.    ? Omega-3 Fatty Acids (FISH OIL) 1000 MG CAPS TAKE 3 CAPSULES (SEA-OMEGA 30 FISH OIL) BY MOUTH TWO TIMES A DAY FOR CHOLESTEROL    ? polyethylene glycol (MIRALAX / GLYCOLAX) 17 g packet Take 17 g by mouth 2 (two) times daily.  0  ? rosuvastatin (CRESTOR) 40 MG tablet Take 40 mg by mouth daily.    ? bisacodyl (DULCOLAX) 10 MG suppository Place 10 mg rectally as needed for moderate constipation. (Patient not taking: Reported on 05/26/2021)    ? calamine lotion Apply  1 application topically as needed for itching. (Patient not taking: Reported on 05/26/2021)    ? epoetin alfa (EPOGEN) 10000 UNIT/ML injection Inject 1 mL (10,000 Units total) into the vein every Monday, Wednesday, an

## 2021-06-07 NOTE — H&P (View-Only) (Signed)
? ?Subjective:  ? ? Patient ID: Phillip Chavez, male    DOB: 06-04-1940, 81 y.o.   MRN: [id number] ?Chief Complaint  ?Patient presents with  ? Establish Care  ? ? ?Phillip Chavez is an 81 year old male that presents today as a referral from Dr. Zollie Scale for evaluation regarding his chronic kidney disease.  It is noticed that the patient recently discussed removing his PermCath with Dr. Zollie Scale as his renal function has improved.  This is confirmed today by the patient's son.  The need for his PermCath and dialysis came due to acute kidney injury during recent hospitalization.  Since the patient has not been on dialysis in the last several weeks he notes that he has not had any uremic symptoms of chest pain or shortness of breath. ? ? ?Review of Systems  ?Cardiovascular:  Negative for leg swelling.  ?All other systems reviewed and are negative. ? ?   ?Objective:  ? Physical Exam ?Vitals reviewed.  ?HENT:  ?   Head: Normocephalic.  ?Cardiovascular:  ?   Rate and Rhythm: Normal rate.  ?   Pulses: Normal pulses.  ?Pulmonary:  ?   Effort: Pulmonary effort is normal.  ?Skin: ?   General: Skin is warm and dry.  ?Neurological:  ?   Mental Status: He is alert and oriented to person, place, and time.  ?Psychiatric:     ?   Mood and Affect: Mood normal.     ?   Behavior: Behavior normal.     ?   Thought Content: Thought content normal.     ?   Judgment: Judgment normal.  ? ? ?BP 133/70 (BP Location: Right Arm)   Pulse 69   Resp 17  ? ?Past Medical History:  ?Diagnosis Date  ? Chronic CHF (congestive heart failure) (HCC)   ? COPD (chronic obstructive pulmonary disease) ([place])   ? GERD (gastroesophageal reflux disease)   ? HLD (hyperlipidemia)   ? HTN (hypertension)   ? ? ?Social History  ? ?Socioeconomic History  ? Marital status: Married  ?  Spouse name: Not on file  ? Number of children: Not on file  ? Years of education: Not on file  ? Highest education level: Not on file  ?Occupational History  ? Not on file  ?Tobacco Use  ?  Smoking status: Former  ?  Packs/day: 1.00  ?  Years: 20.00  ?  Pack years: 20.00  ?  Types: Cigarettes  ?  Quit date: 03/21/1978  ?  Years since quitting: 43.2  ? Smokeless tobacco: Never  ?Vaping Use  ? Vaping Use: Never used  ?Substance and Sexual Activity  ? Alcohol use: Not Currently  ? Drug use: Never  ? Sexual activity: Not on file  ?Other Topics Concern  ? Not on file  ?Social History Narrative  ? Not on file  ? ?Social Determinants of Health  ? ?Financial Resource Strain: Not on file  ?Food Insecurity: Not on file  ?Transportation Needs: Not on file  ?Physical Activity: Not on file  ?Stress: Not on file  ?Social Connections: Not on file  ?Intimate Partner Violence: Not on file  ? ? ?Past Surgical History:  ?Procedure Laterality Date  ? dental procedure    ? DIALYSIS/PERMA CATHETER INSERTION N/A 01/20/2021  ? Procedure: DIALYSIS/PERMA CATHETER INSERTION;  Surgeon: Algernon Huxley, MD;  Location: [facility];  Service: Cardiovascular;  Laterality: N/A;  ? TEE WITHOUT CARDIOVERSION N/A 01/06/2021  ? Procedure: TRANSESOPHAGEAL ECHOCARDIOGRAM (TEE);  Surgeon: Minna Merritts, MD;  Location: [facility];  Service: Cardiovascular;  Laterality: N/A;  ? ? ?Family History  ?Problem Relation Age of Onset  ? Prostate cancer Father   ? Prostate cancer Brother   ? Pancreatic cancer Brother   ? ? ?Allergies  ?Allergen Reactions  ? Aggrenox [Aspirin-Dipyridamole Er] Nausea And Vomiting  ? Atorvastatin Other (See Comments)  ?  Muscle pain  ? Azithromycin   ? Cefpodoxime Other (See Comments)  ?  headache  ? Ciprofloxacin   ? Claritin [Loratadine] Other (See Comments)  ?  Urinary retention  ? Contrast Media [Iodinated Contrast Media] Hives  ? Doxazosin Other (See Comments)  ?  Elevated blood pressure  ? Doxycycline   ? Equal [Aspartame] Diarrhea  ? Fenofibrate Other (See Comments)  ?  Muscle pain  ? Flomax [Tamsulosin] Other (See Comments)  ?  Elevated blood pressure  ? Flunisolide Other (See Comments)  ?  Burning  sensation  ? Fluticasone-Salmeterol   ?  Throat irriation  ? Fluvastatin Other (See Comments)  ?  Muscle pain  ? Gemfibrozil Other (See Comments)  ?  Muscle pain  ? Haemophilus Influenzae Vaccines Other (See Comments)  ?  Chest pain  ? Hydrochlorothiazide Other (See Comments)  ?  weakness  ? Levofloxacin Other (See Comments)  ?  Nervousness, aggitation  ? Lisinopril Other (See Comments)  ?  Chest pain  ? Moxifloxacin Other (See Comments)  ?  Altered behavior  ? Niacin And Related Other (See Comments)  ?  flushing  ? Oxycodone   ? Plavix [Clopidogrel] Other (See Comments)  ?  Constriction in throat  ? Pravastatin Other (See Comments)  ?  Elevated blood pressure  ? Simvastatin Other (See Comments)  ?  Muscle pain  ? Terazosin Other (See Comments)  ?  Fatigue ?  ? Zetia [Ezetimibe] Other (See Comments)  ?  Edema ?  ? Amoxicillin Palpitations  ?  Chest pain  ? Mometasone Palpitations  ?  Elevated blood pressure  ? Sulfa Antibiotics Rash and Hypertension  ? ? ? ?  Latest Ref Rng & Units 02/09/2021  ?  4:35 AM 02/08/2021  ?  4:27 AM 02/07/2021  ?  5:24 AM  ?CBC  ?WBC 4.0 - 10.5 K/uL 9.0   7.4   10.5    ?Hemoglobin 13.0 - 17.0 g/dL 7.3   6.7   7.4    ?Hematocrit 39.0 - 52.0 % 22.7   20.9   23.3    ?Platelets 150 - 400 K/uL 129   130   160    ? ? ? ? ?CMP  ?   ?Component Value Date/Time  ? NA 132 (L) 02/09/2021 0435  ? NA 139 02/01/2013 0302  ? K 3.9 02/09/2021 0435  ? K 4.3 02/01/2013 0302  ? CL 96 (L) 02/09/2021 0435  ? CL 105 02/01/2013 0302  ? CO2 28 02/09/2021 0435  ? CO2 29 02/01/2013 0302  ? GLUCOSE 107 (H) 02/09/2021 0435  ? GLUCOSE 99 02/01/2013 0302  ? BUN 43 (H) 02/09/2021 0435  ? BUN 23 (H) 02/01/2013 0302  ? CREATININE 4.63 (H) 02/09/2021 0435  ? CREATININE 1.17 02/01/2013 0302  ? CALCIUM 7.6 (L) 02/09/2021 0435  ? CALCIUM 8.8 02/01/2013 0302  ? PROT 6.1 (L) 02/05/2021 0130  ? ALBUMIN 2.0 (L) 02/05/2021 0130  ? AST 66 (H) 02/05/2021 0130  ? ALT 112 (H) 02/05/2021 0130  ? ALKPHOS 94 02/05/2021 0130  ? BILITOT  0.7 02/05/2021  0130  ? GFRNONAA 12 (L) 02/09/2021 0435  ? GFRNONAA >60 02/01/2013 0302  ? GFRAA >60 02/01/2013 0302  ? ? ? ?No results found. ? ?   ?Assessment & Plan:  ? ?1. ESRD on hemodialysis ([place]) ?Following discussion with the patient and his son, it seems that the PermCath is no longer necessary.  We will reach out to his dialysis center to obtain orders to remove the PermCath.  We will arrange that PermCath removal once we receive orders. ? ? ?Current Outpatient Medications on File Prior to Visit  ?Medication Sig Dispense Refill  ? albuterol (VENTOLIN HFA) 108 (90 Base) MCG/ACT inhaler Inhale 2 puffs into the lungs every 6 (six) hours as needed for wheezing or shortness of breath.    ? cholecalciferol (VITAMIN D3) 25 MCG (1000 UNIT) tablet Take 1,000 Units by mouth daily.    ? dronabinol (MARINOL) 5 MG capsule Take 5 mg by mouth 2 (two) times daily before a meal.    ? ferrous sulfate 325 (65 FE) MG tablet Take 1 tablet (325 mg total) by mouth daily with breakfast.  3  ? fluticasone furoate-vilanterol (BREO ELLIPTA) 200-25 MCG/ACT AEPB Inhale 1 puff into the lungs daily.    ? ketotifen (ZADITOR) 0.025 % ophthalmic solution 1 drop 2 (two) times daily.    ? multivitamin (RENA-VIT) TABS tablet Take 1 tablet by mouth at bedtime.  0  ? Nutritional Supplements (FEEDING SUPPLEMENT, NEPRO CARB STEADY,) LIQD Take 237 mLs by mouth 3 (three) times daily between meals.  0  ? omega-3 acid ethyl esters (LOVAZA) 1 g capsule Take 3 g by mouth 2 (two) times daily.    ? Omega-3 Fatty Acids (FISH OIL) 1000 MG CAPS TAKE 3 CAPSULES (SEA-OMEGA 30 FISH OIL) BY MOUTH TWO TIMES A DAY FOR CHOLESTEROL    ? polyethylene glycol (MIRALAX / GLYCOLAX) 17 g packet Take 17 g by mouth 2 (two) times daily.  0  ? rosuvastatin (CRESTOR) 40 MG tablet Take 40 mg by mouth daily.    ? bisacodyl (DULCOLAX) 10 MG suppository Place 10 mg rectally as needed for moderate constipation. (Patient not taking: Reported on 05/26/2021)    ? calamine lotion Apply  1 application topically as needed for itching. (Patient not taking: Reported on 05/26/2021)    ? epoetin alfa (EPOGEN) 10000 UNIT/ML injection Inject 1 mL (10,000 Units total) into the vein every Monday, Wednesday, an

## 2021-06-11 ENCOUNTER — Encounter
Admission: RE | Disposition: A | Discharge status: Home / Self Care | Payer: Self-pay | Source: Home / Self Care | Attending: Vascular Surgery

## 2021-06-11 ENCOUNTER — Encounter: Payer: Self-pay | Admitting: Vascular Surgery

## 2021-06-11 ENCOUNTER — Ambulatory Visit
Admission: RE | Admit: 2021-06-11 | Discharge: 2021-06-11 | Disposition: A | Discharge status: Home / Self Care | Payer: No Typology Code available for payment source | Attending: Vascular Surgery | Admitting: Vascular Surgery

## 2021-06-11 DIAGNOSIS — Z4901 Encounter for fitting and adjustment of extracorporeal dialysis catheter: Secondary | ICD-10-CM

## 2021-06-11 DIAGNOSIS — I509 Heart failure, unspecified: Secondary | ICD-10-CM | POA: Insufficient documentation

## 2021-06-11 DIAGNOSIS — N186 End stage renal disease: Secondary | ICD-10-CM | POA: Diagnosis not present

## 2021-06-11 DIAGNOSIS — Z87891 Personal history of nicotine dependence: Secondary | ICD-10-CM | POA: Insufficient documentation

## 2021-06-11 DIAGNOSIS — I132 Hypertensive heart and chronic kidney disease with heart failure and with stage 5 chronic kidney disease, or end stage renal disease: Secondary | ICD-10-CM | POA: Insufficient documentation

## 2021-06-11 HISTORY — DX: Presence of cardiac pacemaker: Z95.0

## 2021-06-11 HISTORY — PX: DIALYSIS/PERMA CATHETER REMOVAL: CATH118289

## 2021-06-11 SURGERY — DIALYSIS/PERMA CATHETER REMOVAL
Anesthesia: LOCAL

## 2021-06-11 MED ORDER — LIDOCAINE HCL (PF) 1 % IJ SOLN
INTRAMUSCULAR | Status: DC | PRN
Start: 2021-06-11 — End: 2021-06-11
  Administered 2021-06-11: 15 mL

## 2021-06-11 SURGICAL SUPPLY — 1 items: TRAY LACERAT/PLASTIC (MISCELLANEOUS) ×1 IMPLANT

## 2021-06-11 NOTE — Op Note (Signed)
Operative Note ? ? ? ? ?Preoperative diagnosis:   1.  Renal failure with return of renal function ? ?Postoperative diagnosis:  1. Same as above ? ?Procedure:  Removal of right jugular Permcath ? ?Surgeon:  Leotis Pain, MD ? ?Anesthesia:  Local ? ?EBL:  Minimal ? ?Indication for the Procedure:  The patient has had return of renal function and no longer needs their permcath.  This can be removed.  Risks and benefits are discussed and informed consent is obtained. ? ?Description of the Procedure:  The patient's right neck, chest and existing catheter were sterilely prepped and draped. The area around the catheter was anesthetized copiously with 1% lidocaine. The catheter was dissected out with curved hemostats until the cuff was freed from the surrounding fibrous sheath. The fiber sheath was transected, and the catheter was then removed in its entirety using gentle traction. Pressure was held and sterile dressings were placed. The patient tolerated the procedure well and was taken to the recovery room in stable condition. ? ? ? ? ?Leotis Pain ? ?06/11/2021, 9:38 AM ?This note was created with Dragon Medical transcription system. Any errors in dictation are purely unintentional.  ?

## 2021-06-11 NOTE — Discharge Instructions (Signed)
Tunneled Catheter Removal, Care After Refer to this sheet in the next few weeks. These instructions provide you with information about caring for yourself after your procedure. Your health care provider may also give you more specific instructions. Your treatment has been planned according to current medical practices, but problems sometimes occur. Call your health care provider if you have any problems or questions after your procedure. What can I expect after the procedure? After the procedure, it is common to have: Some mild redness, swelling, and pain around your catheter site.   Follow these instructions at home: Incision care  Check your removal site  every day for signs of infection. Check for: More redness, swelling, or pain. More fluid or blood. Warmth. Pus or a bad smell. Remove your dressing in 48hrs leave open to air  Activity  Return to your normal activities as told by your health care provider. Ask your health care provider what activities are safe for you. Do not lift anything that is heavier than 10 lb (4.5 kg) for 3 days  You may shower tomorrow  Contact a health care provider if: You have more fluid or blood coming from your removal site You have more redness, swelling, or pain at your incisions or around the area where your catheter was removed Your removal site feel warm to the touch. You feel unusually weak. You feel nauseous.. Get help right away if You have swelling in your arm, shoulder, neck, or face. You develop chest pain. You have difficulty breathing. You feel dizzy or light-headed. You have pus or a bad smell coming from your removal site You have a fever. You develop bleeding from your removal site, and your bleeding does not stop. This information is not intended to replace advice given to you by your health care provider. Make sure you discuss any questions you have with your health care provider. Document Released: 02/02/2012 Document Revised:  10/19/2015 Document Reviewed: 11/11/2014 Elsevier Interactive Patient Education  2017 [employer] 

## 2021-06-11 NOTE — Interval H&P Note (Signed)
History and Physical Interval Note: ? ?06/11/2021 ?9:25 AM ? ?AMIERE CAWLEY  has presented today for surgery, with the diagnosis of Perma Cath Removal    End Stage Renal.  The various methods of treatment have been discussed with the patient and family. After consideration of risks, benefits and other options for treatment, the patient has consented to  Procedure(s): ?DIALYSIS/PERMA CATHETER REMOVAL (N/A) as a surgical intervention.  The patient's history has been reviewed, patient examined, no change in status, stable for surgery.  I have reviewed the patient's chart and labs.  Questions were answered to the patient's satisfaction.   ? ? ?Leotis Pain ? ? ?

## 2021-08-17 ENCOUNTER — Encounter: Payer: No Typology Code available for payment source | Attending: Physician Assistant | Admitting: *Deleted

## 2021-08-17 ENCOUNTER — Encounter: Payer: Self-pay | Admitting: *Deleted

## 2021-08-17 DIAGNOSIS — Z951 Presence of aortocoronary bypass graft: Secondary | ICD-10-CM | POA: Insufficient documentation

## 2021-08-17 DIAGNOSIS — Z48812 Encounter for surgical aftercare following surgery on the circulatory system: Secondary | ICD-10-CM | POA: Insufficient documentation

## 2021-08-17 DIAGNOSIS — R0600 Dyspnea, unspecified: Secondary | ICD-10-CM | POA: Insufficient documentation

## 2021-08-17 DIAGNOSIS — Z952 Presence of prosthetic heart valve: Secondary | ICD-10-CM | POA: Insufficient documentation

## 2021-08-17 NOTE — Progress Notes (Signed)
Virtual orientation call completed today. he has an appointment on Date: 08/25/2021  for EP eval and gym Orientation.  Documentation of diagnosis can be found in [facility] Date: 02/20/2021 .

## 2021-08-25 VITALS — Ht 63.5 in | Wt 155.1 lb

## 2021-08-25 DIAGNOSIS — Z48812 Encounter for surgical aftercare following surgery on the circulatory system: Secondary | ICD-10-CM | POA: Diagnosis present

## 2021-08-25 DIAGNOSIS — Z952 Presence of prosthetic heart valve: Secondary | ICD-10-CM

## 2021-08-25 DIAGNOSIS — Z951 Presence of aortocoronary bypass graft: Secondary | ICD-10-CM | POA: Diagnosis not present

## 2021-08-25 DIAGNOSIS — R0600 Dyspnea, unspecified: Secondary | ICD-10-CM | POA: Diagnosis not present

## 2021-09-02 ENCOUNTER — Encounter: Payer: No Typology Code available for payment source | Attending: Physician Assistant

## 2021-09-02 DIAGNOSIS — Z48812 Encounter for surgical aftercare following surgery on the circulatory system: Secondary | ICD-10-CM | POA: Insufficient documentation

## 2021-09-02 DIAGNOSIS — J449 Chronic obstructive pulmonary disease, unspecified: Secondary | ICD-10-CM | POA: Diagnosis present

## 2021-09-02 DIAGNOSIS — Z952 Presence of prosthetic heart valve: Secondary | ICD-10-CM | POA: Insufficient documentation

## 2021-09-02 DIAGNOSIS — Z951 Presence of aortocoronary bypass graft: Secondary | ICD-10-CM | POA: Diagnosis present

## 2021-09-02 NOTE — Progress Notes (Signed)
Daily Session Note  Patient Details  Name: Phillip Chavez MRN: [id number] Date of Birth: 02-Feb-1941 Referring Provider:   Flowsheet Row Cardiac Rehab from 08/25/2021 in [name]  Referring Provider Loma Boston MD       Encounter Date: 09/02/2021  Check In:  Session Check In - 09/02/21 1330       Check-In   Supervising physician immediately available to respond to emergencies See telemetry face sheet for immediately available ER MD    Location [facility]    Staff Present Antionette Fairy, BS, Exercise Physiologist;Roshni Burbano Rosalia Hammers, MPA, RN;Susanne Bice, RN, BSN, CCRP;Joseph Hood, RCP,RRT,BSRT    Virtual Visit No    Medication changes reported     No    Fall or balance concerns reported    No    Tobacco Cessation No Change    Warm-up and Cool-down Performed on first and last piece of equipment    Resistance Training Performed Yes    VAD Patient? No    PAD/SET Patient? No      Pain Assessment   Currently in Pain? No/denies                Social History   Tobacco Use  Smoking Status Former   Packs/day: 1.00   Years: 20.00   Total pack years: 20.00   Types: Cigarettes   Quit date: 03/21/1978   Years since quitting: 43.4  Smokeless Tobacco Never    Goals Met:  Independence with exercise equipment Exercise tolerated well No report of concerns or symptoms today Strength training completed today  Goals Unmet:  Not Applicable  Comments: First full day of exercise!  Patient was oriented to gym and equipment including functions, settings, policies, and procedures.  Patient's individual exercise prescription and treatment plan were reviewed.  All starting workloads were established based on the results of the 6 minute walk test done at initial orientation visit.  The plan for exercise progression was also introduced and progression will be customized based on patient's performance and goals.    Dr. Emily Filbert is Medical  Director for [place].  Dr. Ottie Glazier is Medical Director for [facility].

## 2021-09-07 DIAGNOSIS — Z952 Presence of prosthetic heart valve: Secondary | ICD-10-CM

## 2021-09-07 DIAGNOSIS — Z951 Presence of aortocoronary bypass graft: Secondary | ICD-10-CM | POA: Diagnosis not present

## 2021-09-07 NOTE — Progress Notes (Signed)
Daily Session Note  Patient Details  Name: Phillip Chavez MRN: [id number] Date of Birth: 22-Feb-1941 Referring Provider:   Flowsheet Row Cardiac Rehab from 08/25/2021 in [facility]  Referring Provider Loma Boston MD       Encounter Date: 09/07/2021  Check In:  Session Check In - 09/07/21 1332       Check-In   Supervising physician immediately available to respond to emergencies See telemetry face sheet for immediately available ER MD    Location [facility]-Cardiac & Pulmonary Rehab    Staff Present Justin Mend, RCP,RRT,BSRT;Laureen Owens Shark, BS, RRT, CPFT;Auriel Kist, MPA, RN    Virtual Visit No    Medication changes reported     No    Fall or balance concerns reported    No    Tobacco Cessation No Change    Warm-up and Cool-down Performed on first and last piece of equipment    Resistance Training Performed Yes    VAD Patient? No    PAD/SET Patient? No      Pain Assessment   Currently in Pain? No/denies                Social History   Tobacco Use  Smoking Status Former   Packs/day: 1.00   Years: 20.00   Total pack years: 20.00   Types: Cigarettes   Quit date: 03/21/1978   Years since quitting: 43.4  Smokeless Tobacco Never    Goals Met:  [facility] with exercise equipment Exercise tolerated well No report of concerns or symptoms today Strength training completed today  Goals Unmet:  Not Applicable  Comments: Pt able to follow exercise prescription today without complaint.  Will continue to monitor for progression.    Dr. Emily Filbert is Medical Director for [facility].  Dr. Ottie Glazier is Medical Director for [facility].

## 2021-09-09 ENCOUNTER — Encounter: Payer: Self-pay | Admitting: *Deleted

## 2021-09-09 DIAGNOSIS — Z951 Presence of aortocoronary bypass graft: Secondary | ICD-10-CM

## 2021-09-09 DIAGNOSIS — Z952 Presence of prosthetic heart valve: Secondary | ICD-10-CM

## 2021-09-09 NOTE — Progress Notes (Signed)
Cardiac Individual Treatment Plan  Patient Details  Name: Phillip Chavez MRN: [id number] Date of Birth: [birth year] Referring Provider:   Flowsheet Row Cardiac Rehab from 08/25/2021 in [facility] Cardiac and Pulmonary Rehab  Referring Provider Loma Boston MD       Initial Encounter Date:  Flowsheet Row Cardiac Rehab from 08/25/2021 in [facility] Cardiac and Pulmonary Rehab  Date 08/25/21       Visit Diagnosis: S/P AVR (aortic valve replacement)  S/P CABG x 1  Patient's Home Medications on Admission:  Current Outpatient Medications:    albuterol (VENTOLIN HFA) 108 (90 Base) MCG/ACT inhaler, Inhale 2 puffs into the lungs every 6 (six) hours as needed for wheezing or shortness of breath., Disp: , Rfl:    ascorbic acid (VITAMIN C) 500 MG tablet, Take by mouth., Disp: , Rfl:    bisacodyl (DULCOLAX) 10 MG suppository, Place 10 mg rectally as needed for moderate constipation. (Patient not taking: Reported on 05/26/2021), Disp: , Rfl:    calamine lotion, Apply 1 application topically as needed for itching. (Patient not taking: Reported on 05/26/2021), Disp: , Rfl:    cholecalciferol (VITAMIN D3) 25 MCG (1000 UNIT) tablet, Take 1,000 Units by mouth daily., Disp: , Rfl:    dronabinol (MARINOL) 5 MG capsule, Take 5 mg by mouth 2 (two) times daily before a meal. (Patient not taking: Reported on 08/17/2021), Disp: , Rfl:    epoetin alfa (EPOGEN) 10000 UNIT/ML injection, Inject 1 mL (10,000 Units total) into the vein every Monday, Wednesday, and Friday with hemodialysis. (Patient not taking: Reported on 05/26/2021), Disp: 1 mL, Rfl:    ferrous sulfate 325 (65 FE) MG tablet, Take 1 tablet (325 mg total) by mouth daily with breakfast., Disp: , Rfl: 3   fluticasone (FLONASE) 50 MCG/ACT nasal spray, INSTILL 1 SPRAY INTO EACH NOSTRIL EVERY DAY MAXIMUM 2 SPRAYS IN EACH NOSTRIL DAILY. FOR ALLERGIES, Disp: , Rfl:    fluticasone furoate-vilanterol (BREO ELLIPTA) 200-25 MCG/ACT AEPB, Inhale 1 puff into the lungs daily.,  Disp: , Rfl:    ketotifen (ZADITOR) 0.025 % ophthalmic solution, 1 drop 2 (two) times daily., Disp: , Rfl:    losartan (COZAAR) 25 MG tablet, TAKE ONE TABLET BY MOUTH TWO TIMES A DAY FOR BLOOD PRESSURE, Disp: , Rfl:    metoCLOPramide (REGLAN) 5 MG tablet, Take 5 mg by mouth 4 (four) times daily -  before meals and at bedtime. (Patient not taking: Reported on 05/26/2021), Disp: , Rfl:    metoprolol tartrate (LOPRESSOR) 25 MG tablet, TAKE ONE-HALF TABLET BY MOUTH TWO TIMES A DAY FOR HIGH BLOOD PRESSURE, Disp: , Rfl:    Nutritional Supplements (FEEDING SUPPLEMENT, NEPRO CARB STEADY,) LIQD, Take 237 mLs by mouth 3 (three) times daily between meals. (Patient not taking: Reported on 08/17/2021), Disp: , Rfl: 0   nystatin (MYCOSTATIN/NYSTOP) powder, Apply 1 application topically 2 (two) times daily. (Patient not taking: Reported on 05/26/2021), Disp: , Rfl:    omega-3 acid ethyl esters (LOVAZA) 1 g capsule, Take 3 g by mouth 2 (two) times daily. (Patient not taking: Reported on 08/17/2021), Disp: , Rfl:    Omega-3 Fatty Acids (FISH OIL) 1000 MG CAPS, TAKE 3 CAPSULES (SEA-OMEGA 30 FISH OIL) BY MOUTH TWO TIMES A DAY FOR CHOLESTEROL, Disp: , Rfl:    pantoprazole (PROTONIX) 40 MG tablet, 1 tablet daily. (Patient not taking: Reported on 05/26/2021), Disp: , Rfl:    polyethylene glycol (MIRALAX / GLYCOLAX) 17 g packet, Take 17 g by mouth 2 (two) times daily., Disp: ,  Rfl: 0   rosuvastatin (CRESTOR) 40 MG tablet, Take 40 mg by mouth daily., Disp: , Rfl:    tamsulosin (FLOMAX) 0.4 MG CAPS capsule, Take 1 capsule (0.4 mg total) by mouth daily. (Patient not taking: Reported on 05/26/2021), Disp: , Rfl:   Past Medical History: Past Medical History:  Diagnosis Date   Chronic CHF (congestive heart failure) (HCC)    COPD (chronic obstructive pulmonary disease) (HCC)    GERD (gastroesophageal reflux disease)    HLD (hyperlipidemia)    HTN (hypertension)    Presence of permanent cardiac pacemaker     Tobacco  Use: Social History   Tobacco Use  Smoking Status Former   Packs/day: 1.00   Years: 20.00   Total pack years: 20.00   Types: Cigarettes   Quit date: 03/21/1978   Years since quitting: 43.5  Smokeless Tobacco Never    Labs: Review Flowsheet       Latest Ref Rng & Units 01/04/2021 01/05/2021 01/11/2021  Labs for ITP Cardiac and Pulmonary Rehab  Cholestrol 0 - 200 mg/dL 123  - -  LDL (calc) 0 - 99 mg/dL 86  - -  HDL-C >40 mg/dL 13  - -  Trlycerides <150 mg/dL 120  - -  Hemoglobin A1c 4.8 - 5.6 % - 6.1  -  PH, Arterial 7.350 - 7.450 - - 7.40   PCO2 arterial 32.0 - 48.0 mmHg - - 34   Bicarbonate 20.0 - 28.0 mmol/L - - 21.1   Acid-base deficit 0.0 - 2.0 mmol/L - - 3.2   O2 Saturation % - - 98.9      Exercise Target Goals: Exercise Program Goal: Individual exercise prescription set using results from initial 6 min walk test and THRR while considering  patient's activity barriers and safety.   Exercise Prescription Goal: Initial exercise prescription builds to 30-45 minutes a day of aerobic activity, 2-3 days per week.  Home exercise guidelines will be given to patient during program as part of exercise prescription that the participant will acknowledge.   Education: Aerobic Exercise: - Group verbal and visual presentation on the components of exercise prescription. Introduces F.I.T.T principle from ACSM for exercise prescriptions.  Reviews F.I.T.T. principles of aerobic exercise including progression. Written material given at graduation. Flowsheet Row Cardiac Rehab from 09/02/2021 in [facility] Cardiac and Pulmonary Rehab  Date 09/02/21  Educator [place]  Instruction Review Code 1- Verbalizes Understanding       Education: Resistance Exercise: - Group verbal and visual presentation on the components of exercise prescription. Introduces F.I.T.T principle from ACSM for exercise prescriptions  Reviews F.I.T.T. principles of resistance exercise including progression. Written material given  at graduation. Flowsheet Row Pulmonary Rehab from 03/19/2020 in [facility] Cardiac and Pulmonary Rehab  Date 02/20/20  Educator [place]  Instruction Review Code 1- Verbalizes Understanding        Education: Exercise & Equipment Safety: - Individual verbal instruction and demonstration of equipment use and safety with use of the equipment. Flowsheet Row Cardiac Rehab from 09/02/2021 in [facility] Cardiac and Pulmonary Rehab  Education need identified 08/25/21  Date 08/25/21  Educator [place]  Instruction Review Code 1- Verbalizes Understanding       Education: Exercise Physiology & General Exercise Guidelines: - Group verbal and written instruction with models to review the exercise physiology of the cardiovascular system and associated critical values. Provides general exercise guidelines with specific guidelines to those with heart or lung disease.  Flowsheet Row Pulmonary Rehab from 03/19/2020 in [facility] Cardiac and Pulmonary Rehab  Date 02/13/20  Educator [place]  Instruction Review Code 1- Verbalizes Understanding       Education: Flexibility, Balance, Mind/Body Relaxation: - Group verbal and visual presentation with interactive activity on the components of exercise prescription. Introduces F.I.T.T principle from ACSM for exercise prescriptions. Reviews F.I.T.T. principles of flexibility and balance exercise training including progression. Also discusses the mind body connection.  Reviews various relaxation techniques to help reduce and manage stress (i.e. Deep breathing, progressive muscle relaxation, and visualization). Balance handout provided to take home. Written material given at graduation. Flowsheet Row Pulmonary Rehab from 03/19/2020 in [facility] Cardiac and Pulmonary Rehab  Date 01/02/20  Educator [place]  Instruction Review Code 1- Verbalizes Understanding       Activity Barriers & Risk Stratification:  Activity Barriers & Cardiac Risk Stratification - 08/25/21 1159       Activity Barriers & Cardiac  Risk Stratification   Activity Barriers Shortness of Breath;Deconditioning;Muscular Weakness;Joint Problems;Other (comment)    Comments Right shoulder limited ROM    Cardiac Risk Stratification High             6 Minute Walk:  6 Minute Walk     Row Name 08/25/21 1200         6 Minute Walk   Phase Initial     Distance 800 feet     Walk Time 6 minutes     # of Rest Breaks 0     MPH 1.51     METS 1.19     RPE 13     Perceived Dyspnea  0     VO2 Peak 4.19     Symptoms Yes (comment)     Comments Fatigue, Bilateral tightness in hips     Resting HR 72 bpm     Resting BP 108/58     Resting Oxygen Saturation  97 %     Exercise Oxygen Saturation  during 6 min walk 98 %     Max Ex. HR 93 bpm     Max Ex. BP 122/64     2 Minute Post BP 112/58              Oxygen Initial Assessment:   Oxygen Re-Evaluation:   Oxygen Discharge (Final Oxygen Re-Evaluation):   Initial Exercise Prescription:  Initial Exercise Prescription - 08/25/21 1400       Date of Initial Exercise RX and Referring Provider   Date 08/25/21    Referring Provider Loma Boston MD      Oxygen   Maintain Oxygen Saturation 88% or higher      Treadmill   MPH 1.3    Grade 0    Minutes 15    METs 2      NuStep   Level 1    SPM 80    Minutes 15    METs 1.1      T5 Nustep   Level 1    SPM 80    Minutes 15    METs 1.1      Biostep-RELP   Level 1    SPM 50    Minutes 15    METs 1.1      Prescription Details   Frequency (times per week) 2    Duration Progress to 30 minutes of continuous aerobic without signs/symptoms of physical distress      Intensity   THRR 40-80% of Max Heartrate 98 - 124    Ratings of Perceived Exertion 11-13    Perceived Dyspnea 0-4  Progression   Progression Continue to progress workloads to maintain intensity without signs/symptoms of physical distress.      Resistance Training   Training Prescription Yes    Weight 3 lb    Reps 10-15              Perform Capillary Blood Glucose checks as needed.  Exercise Prescription Changes:   Exercise Prescription Changes     Row Name 08/25/21 1400 09/07/21 0800           Response to Exercise   Blood Pressure (Admit) 108/58 128/64      Blood Pressure (Exercise) 122/64 184/64      Blood Pressure (Exit) 112/58 110/62      Heart Rate (Admit) 72 bpm 71 bpm      Heart Rate (Exercise) 93 bpm 99 bpm      Heart Rate (Exit) 69 bpm 68 bpm      Oxygen Saturation (Admit) 97 % --      Oxygen Saturation (Exercise) 98 % --      Oxygen Saturation (Exit) 96 % --      Rating of Perceived Exertion (Exercise) 13 12      Perceived Dyspnea (Exercise) 0 --      Symptoms Fatigue, bilateral tightness in hips fatigue      Comments walk test results first full day of exercise      Duration -- Continue with 30 min of aerobic exercise without signs/symptoms of physical distress.      Intensity -- THRR unchanged        Progression   Progression -- Continue to progress workloads to maintain intensity without signs/symptoms of physical distress.      Average METs -- 1.75        Resistance Training   Training Prescription -- Yes      Weight -- 3 lb      Reps -- 10-15        Interval Training   Interval Training -- No        NuStep   Level -- 1      Minutes -- 15      METs -- 1.5        Biostep-RELP   Level -- 1      Minutes -- 15      METs -- 2        Oxygen   Maintain Oxygen Saturation -- 88% or higher               Exercise Comments:   Exercise Comments     Row Name 09/02/21 1332           Exercise Comments First full day of exercise!  Patient was oriented to gym and equipment including functions, settings, policies, and procedures.  Patient's individual exercise prescription and treatment plan were reviewed.  All starting workloads were established based on the results of the 6 minute walk test done at initial orientation visit.  The plan for exercise progression was also  introduced and progression will be customized based on patient's performance and goals.                Exercise Goals and Review:   Exercise Goals     Row Name 08/25/21 1426             Exercise Goals   Increase Physical Activity Yes       Intervention Provide advice, education, support and counseling about physical activity/exercise needs.;Develop an individualized exercise  prescription for aerobic and resistive training based on initial evaluation findings, risk stratification, comorbidities and participant's personal goals.       Expected Outcomes Short Term: Attend rehab on a regular basis to increase amount of physical activity.;Long Term: Add in home exercise to make exercise part of routine and to increase amount of physical activity.;Long Term: Exercising regularly at least 3-5 days a week.       Increase Strength and Stamina Yes       Intervention Provide advice, education, support and counseling about physical activity/exercise needs.;Develop an individualized exercise prescription for aerobic and resistive training based on initial evaluation findings, risk stratification, comorbidities and participant's personal goals.       Expected Outcomes Short Term: Increase workloads from initial exercise prescription for resistance, speed, and METs.;Short Term: Perform resistance training exercises routinely during rehab and add in resistance training at home;Long Term: Improve cardiorespiratory fitness, muscular endurance and strength as measured by increased METs and functional capacity (6MWT)       Able to understand and use rate of perceived exertion (RPE) scale Yes       Intervention Provide education and explanation on how to use RPE scale       Expected Outcomes Short Term: Able to use RPE daily in rehab to express subjective intensity level;Long Term:  Able to use RPE to guide intensity level when exercising independently       Able to understand and use Dyspnea scale Yes        Intervention Provide education and explanation on how to use Dyspnea scale       Expected Outcomes Short Term: Able to use Dyspnea scale daily in rehab to express subjective sense of shortness of breath during exertion;Long Term: Able to use Dyspnea scale to guide intensity level when exercising independently       Knowledge and understanding of Target Heart Rate Range (THRR) Yes       Intervention Provide education and explanation of THRR including how the numbers were predicted and where they are located for reference       Expected Outcomes Short Term: Able to state/look up THRR;Short Term: Able to use daily as guideline for intensity in rehab;Long Term: Able to use THRR to govern intensity when exercising independently       Able to check pulse independently Yes       Intervention Provide education and demonstration on how to check pulse in carotid and radial arteries.;Review the importance of being able to check your own pulse for safety during independent exercise       Expected Outcomes Short Term: Able to explain why pulse checking is important during independent exercise;Long Term: Able to check pulse independently and accurately       Understanding of Exercise Prescription Yes       Intervention Provide education, explanation, and written materials on patient's individual exercise prescription       Expected Outcomes Short Term: Able to explain program exercise prescription;Long Term: Able to explain home exercise prescription to exercise independently                Exercise Goals Re-Evaluation :  Exercise Goals Re-Evaluation     Row Name 09/02/21 1332 09/07/21 0824           Exercise Goal Re-Evaluation   Exercise Goals Review Increase Physical Activity;Able to understand and use rate of perceived exertion (RPE) scale;Knowledge and understanding of Target Heart Rate Range (THRR);Understanding of Exercise Prescription;Able to check  pulse independently;Able to understand and use  Dyspnea scale;Increase Strength and Stamina Increase Physical Activity;Increase Strength and Stamina;Understanding of Exercise Prescription      Comments Reviewed RPE and dyspnea scales, THR and program prescription with pt today.  Pt voiced understanding and was given a copy of goals to take home. Christop has completed his first full day of exercise!  He was able to his full 30 minutes on his first day.  We will continue to monitor his progression.      Expected Outcomes Short: Use RPE daily to regulate intensity. Long: Follow program prescription in THR. Short: Continue to attend rehab regularly Long: Continue to follow program prescription               Discharge Exercise Prescription (Final Exercise Prescription Changes):  Exercise Prescription Changes - 09/07/21 0800       Response to Exercise   Blood Pressure (Admit) 128/64    Blood Pressure (Exercise) 184/64    Blood Pressure (Exit) 110/62    Heart Rate (Admit) 71 bpm    Heart Rate (Exercise) 99 bpm    Heart Rate (Exit) 68 bpm    Rating of Perceived Exertion (Exercise) 12    Symptoms fatigue    Comments first full day of exercise    Duration Continue with 30 min of aerobic exercise without signs/symptoms of physical distress.    Intensity THRR unchanged      Progression   Progression Continue to progress workloads to maintain intensity without signs/symptoms of physical distress.    Average METs 1.75      Resistance Training   Training Prescription Yes    Weight 3 lb    Reps 10-15      Interval Training   Interval Training No      NuStep   Level 1    Minutes 15    METs 1.5      Biostep-RELP   Level 1    Minutes 15    METs 2      Oxygen   Maintain Oxygen Saturation 88% or higher             Nutrition:  Target Goals: Understanding of nutrition guidelines, daily intake of sodium 1500mg , cholesterol 200mg , calories 30% from fat and 7% or less from saturated fats, daily to have 5 or more servings of  fruits and vegetables.  Education: All About Nutrition: -Group instruction provided by verbal, written material, interactive activities, discussions, models, and posters to present general guidelines for heart healthy nutrition including fat, fiber, MyPlate, the role of sodium in heart healthy nutrition, utilization of the nutrition label, and utilization of this knowledge for meal planning. Follow up email sent as well. Written material given at graduation. Flowsheet Row Cardiac Rehab from 09/02/2021 in [facility] Cardiac and Pulmonary Rehab  Education need identified 08/25/21       Biometrics:  Pre Biometrics - 08/25/21 1159       Pre Biometrics   Height 5' 3.5" (1.613 m)    Weight 155 lb 1.6 oz (70.4 kg)    BMI (Calculated) 27.04    Single Leg Stand 2.59 seconds              Nutrition Therapy Plan and Nutrition Goals:  Nutrition Therapy & Goals - 08/25/21 1416       Intervention Plan   Intervention Prescribe, educate and counsel regarding individualized specific dietary modifications aiming towards targeted core components such as weight, hypertension, lipid management, diabetes, heart failure and  other comorbidities.    Expected Outcomes Short Term Goal: Understand basic principles of dietary content, such as calories, fat, sodium, cholesterol and nutrients.;Short Term Goal: A plan has been developed with personal nutrition goals set during dietitian appointment.;Long Term Goal: Adherence to prescribed nutrition plan.             Nutrition Assessments:  MEDIFICTS Score Key: ?70 Need to make dietary changes  40-70 Heart Healthy Diet ? 40 Therapeutic Level Cholesterol Diet  Flowsheet Row Cardiac Rehab from 08/25/2021 in [facility] Cardiac and Pulmonary Rehab  Picture Your Plate Total Score on Admission 54      Picture Your Plate Scores: <03 Unhealthy dietary pattern with much room for improvement. 41-50 Dietary pattern unlikely to meet recommendations for good health and  room for improvement. 51-60 More healthful dietary pattern, with some room for improvement.  >60 Healthy dietary pattern, although there may be some specific behaviors that could be improved.    Nutrition Goals Re-Evaluation:   Nutrition Goals Discharge (Final Nutrition Goals Re-Evaluation):   Psychosocial: Target Goals: Acknowledge presence or absence of significant depression and/or stress, maximize coping skills, provide positive support system. Participant is able to verbalize types and ability to use techniques and skills needed for reducing stress and depression.   Education: Stress, Anxiety, and Depression - Group verbal and visual presentation to define topics covered.  Reviews how body is impacted by stress, anxiety, and depression.  Also discusses healthy ways to reduce stress and to treat/manage anxiety and depression.  Written material given at graduation.   Education: Sleep Hygiene -Provides group verbal and written instruction about how sleep can affect your health.  Define sleep hygiene, discuss sleep cycles and impact of sleep habits. Review good sleep hygiene tips.    Initial Review & Psychosocial Screening:  Initial Psych Review & Screening - 08/17/21 1516       Initial Review   Current issues with None Identified      Family Dynamics   Good Support System? Yes   wife  son     Barriers   Psychosocial barriers to participate in program There are no identifiable barriers or psychosocial needs.      Screening Interventions   Interventions Encouraged to exercise    Expected Outcomes Short Term goal: Utilizing psychosocial counselor, staff and physician to assist with identification of specific Stressors or current issues interfering with healing process. Setting desired goal for each stressor or current issue identified.;Long Term Goal: Stressors or current issues are controlled or eliminated.;Short Term goal: Identification and review with participant of any  Quality of Life or Depression concerns found by scoring the questionnaire.;Long Term goal: The participant improves quality of Life and PHQ9 Scores as seen by post scores and/or verbalization of changes             Quality of Life Scores:   Quality of Life - 09/07/21 1335       Quality of Life   Select Quality of Life      Quality of Life Scores   Health/Function Pre 21.77 %    Socioeconomic Pre 26.88 %    Psych/Spiritual Pre 28.43 %    Family Pre 28.8 %    GLOBAL Pre 25.27 %            Scores of 19 and below usually indicate a poorer quality of life in these areas.  A difference of  2-3 points is a clinically meaningful difference.  A difference of 2-3 points in  the total score of the Quality of Life Index has been associated with significant improvement in overall quality of life, self-image, physical symptoms, and general health in studies assessing change in quality of life.  PHQ-9: Review Flowsheet       08/25/2021 01/17/2020 11/22/2019  Depression screen PHQ 2/9  Decreased Interest 2 0 0  Down, Depressed, Hopeless 0 0 0  PHQ - 2 Score 2 0 0  Altered sleeping 0 0 2  Tired, decreased energy 3 0 1  Change in appetite 0 2 2  Feeling bad or failure about yourself  1 0 0  Trouble concentrating 0 0 0  Moving slowly or fidgety/restless 0 0 0  Suicidal thoughts 0 0 0  PHQ-9 Score 6 2 5   Difficult doing work/chores Somewhat difficult Not difficult at all Not difficult at all   Interpretation of Total Score  Total Score Depression Severity:  1-4 = Minimal depression, 5-9 = Mild depression, 10-14 = Moderate depression, 15-19 = Moderately severe depression, 20-27 = Severe depression   Psychosocial Evaluation and Intervention:   Psychosocial Re-Evaluation:   Psychosocial Discharge (Final Psychosocial Re-Evaluation):   Vocational Rehabilitation: Provide vocational rehab assistance to qualifying candidates.   Vocational Rehab Evaluation &  Intervention:   Education: Education Goals: Education classes will be provided on a variety of topics geared toward better understanding of heart health and risk factor modification. Participant will state understanding/return demonstration of topics presented as noted by education test scores.  Learning Barriers/Preferences:   General Cardiac Education Topics:  AED/CPR: - Group verbal and written instruction with the use of models to demonstrate the basic use of the AED with the basic ABC's of resuscitation.   Anatomy and Cardiac Procedures: - Group verbal and visual presentation and models provide information about basic cardiac anatomy and function. Reviews the testing methods done to diagnose heart disease and the outcomes of the test results. Describes the treatment choices: Medical Management, Angioplasty, or Coronary Bypass Surgery for treating various heart conditions including Myocardial Infarction, Angina, Valve Disease, and Cardiac Arrhythmias.  Written material given at graduation. Flowsheet Row Pulmonary Rehab from 03/19/2020 in [facility] Cardiac and Pulmonary Rehab  Date 02/20/20  Educator [facility]  Instruction Review Code 1- Verbalizes Understanding       Medication Safety: - Group verbal and visual instruction to review commonly prescribed medications for heart and lung disease. Reviews the medication, class of the drug, and side effects. Includes the steps to properly store meds and maintain the prescription regimen.  Written material given at graduation. Flowsheet Row Pulmonary Rehab from 03/19/2020 in [facility] Cardiac and Pulmonary Rehab  Date 03/19/20  Educator [place]  Instruction Review Code 1- Verbalizes Understanding       Intimacy: - Group verbal instruction through game format to discuss how heart and lung disease can affect sexual intimacy. Written material given at graduation.. Flowsheet Row Cardiac Rehab from 09/02/2021 in [facility] Cardiac and Pulmonary Rehab  Date 09/02/21   Educator [place]  Instruction Review Code 1- Verbalizes Understanding       Know Your Numbers and Heart Failure: - Group verbal and visual instruction to discuss disease risk factors for cardiac and pulmonary disease and treatment options.  Reviews associated critical values for Overweight/Obesity, Hypertension, Cholesterol, and Diabetes.  Discusses basics of heart failure: signs/symptoms and treatments.  Introduces Heart Failure Zone chart for action plan for heart failure.  Written material given at graduation.   Infection Prevention: - Provides verbal and written material to individual with discussion of  infection control including proper hand washing and proper equipment cleaning during exercise session. Flowsheet Row Cardiac Rehab from 09/02/2021 in [facility] Cardiac and Pulmonary Rehab  Education need identified 08/25/21  Date 08/25/21  Educator [place]  Instruction Review Code 1- Verbalizes Understanding       Falls Prevention: - Provides verbal and written material to individual with discussion of falls prevention and safety. Flowsheet Row Cardiac Rehab from 09/02/2021 in [facility] Cardiac and Pulmonary Rehab  Education need identified 08/25/21  Date 08/25/21  Educator [place]  Instruction Review Code 1- Verbalizes Understanding       Other: -Provides group and verbal instruction on various topics (see comments)   Knowledge Questionnaire Score:  Knowledge Questionnaire Score - 09/07/21 1334       Knowledge Questionnaire Score   Pre Score cardiac: 20/26: 7heart, 9angina, 10sex, 11diabetes, 17nutrition, 19exercise             Core Components/Risk Factors/Patient Goals at Admission:  Personal Goals and Risk Factors at Admission - 08/25/21 1427       Core Components/Risk Factors/Patient Goals on Admission    Weight Management Yes;Weight Maintenance    Intervention Weight Management: Develop a combined nutrition and exercise program designed to reach desired caloric intake, while  maintaining appropriate intake of nutrient and fiber, sodium and fats, and appropriate energy expenditure required for the weight goal.;Weight Management: Provide education and appropriate resources to help participant work on and attain dietary goals.;Weight Management/Obesity: Establish reasonable short term and long term weight goals.    Admit Weight 155 lb (70.3 kg)    Goal Weight: Short Term 155 lb (70.3 kg)    Goal Weight: Long Term 155 lb (70.3 kg)    Expected Outcomes Short Term: Continue to assess and modify interventions until short term weight is achieved;Weight Maintenance: Understanding of the daily nutrition guidelines, which includes 25-35% calories from fat, 7% or less cal from saturated fats, less than 200mg  cholesterol, less than 1.5gm of sodium, & 5 or more servings of fruits and vegetables daily;Understanding recommendations for meals to include 15-35% energy as protein, 25-35% energy from fat, 35-60% energy from carbohydrates, less than 200mg  of dietary cholesterol, 20-35 gm of total fiber daily;Understanding of distribution of calorie intake throughout the day with the consumption of 4-5 meals/snacks    Hypertension Yes    Intervention Provide education on lifestyle modifcations including regular physical activity/exercise, weight management, moderate sodium restriction and increased consumption of fresh fruit, vegetables, and low fat dairy, alcohol moderation, and smoking cessation.;Monitor prescription use compliance.    Expected Outcomes Short Term: Continued assessment and intervention until BP is < 140/51mm HG in hypertensive participants. < 130/70mm HG in hypertensive participants with diabetes, heart failure or chronic kidney disease.;Long Term: Maintenance of blood pressure at goal levels.    Lipids Yes    Intervention Provide education and support for participant on nutrition & aerobic/resistive exercise along with prescribed medications to achieve LDL 70mg , HDL >40mg .     Expected Outcomes Short Term: Participant states understanding of desired cholesterol values and is compliant with medications prescribed. Participant is following exercise prescription and nutrition guidelines.;Long Term: Cholesterol controlled with medications as prescribed, with individualized exercise RX and with personalized nutrition plan. Value goals: LDL < 70mg , HDL > 40 mg.             Education:Diabetes - Individual verbal and written instruction to review signs/symptoms of diabetes, desired ranges of glucose level fasting, after meals and with exercise. Acknowledge that pre and post exercise  glucose checks will be done for 3 sessions at entry of program.   Core Components/Risk Factors/Patient Goals Review:    Core Components/Risk Factors/Patient Goals at Discharge (Final Review):    ITP Comments:  ITP Comments     Row Name 08/17/21 1535 08/25/21 0937 09/02/21 1332 09/09/21 0732     ITP Comments Virtual orientation call completed today. he has an appointment on Date: 08/25/2021  for EP eval and gym Orientation.  Documentation of diagnosis can be found in [facility] Date: 02/20/2021 . Completed 6MWT and gym orientation. Initial ITP created and sent for review to Dr. Emily Filbert, Medical Director. First full day of exercise!  Patient was oriented to gym and equipment including functions, settings, policies, and procedures.  Patient's individual exercise prescription and treatment plan were reviewed.  All starting workloads were established based on the results of the 6 minute walk test done at initial orientation visit.  The plan for exercise progression was also introduced and progression will be customized based on patient's performance and goals. 30 Day review completed. Medical Director ITP review done, changes made as directed, and signed approval by Medical Director.   New             Comments:

## 2021-09-09 NOTE — Progress Notes (Signed)
Daily Session Note  Patient Details  Name: Phillip Chavez MRN: [id number] Date of Birth: March 17, 1940 Referring Provider:   Flowsheet Row Cardiac Rehab from 08/25/2021 in [facility] Cardiac and Pulmonary Rehab  Referring Provider Loma Boston MD       Encounter Date: 09/09/2021  Check In:  Session Check In - 09/09/21 1325       Check-In   Supervising physician immediately available to respond to emergencies See telemetry face sheet for immediately available ER MD    Location [facility]-Cardiac & Pulmonary Rehab    Staff Present Justin Mend, RCP,RRT,BSRT;Shaw Dobek, MPA, RN;Melissa [place], RDN, LDN    Virtual Visit No    Medication changes reported     No    Fall or balance concerns reported    No    Tobacco Cessation No Change    Warm-up and Cool-down Performed on first and last piece of equipment    Resistance Training Performed Yes    VAD Patient? No    PAD/SET Patient? No      Pain Assessment   Currently in Pain? No/denies                Social History   Tobacco Use  Smoking Status Former   Packs/day: 1.00   Years: 20.00   Total pack years: 20.00   Types: Cigarettes   Quit date: 03/21/1978   Years since quitting: 43.5  Smokeless Tobacco Never    Goals Met:  Independence with exercise equipment Exercise tolerated well No report of concerns or symptoms today Strength training completed today  Goals Unmet:  Not Applicable  Comments: Pt able to follow exercise prescription today without complaint.  Will continue to monitor for progression.    Dr. Emily Filbert is Medical Director for [place].  Dr. Ottie Glazier is Medical Director for [facility] Pulmonary Rehabilitation.

## 2021-09-14 ENCOUNTER — Encounter: Payer: No Typology Code available for payment source | Admitting: *Deleted

## 2021-09-14 DIAGNOSIS — Z951 Presence of aortocoronary bypass graft: Secondary | ICD-10-CM

## 2021-09-14 DIAGNOSIS — Z952 Presence of prosthetic heart valve: Secondary | ICD-10-CM

## 2021-09-14 DIAGNOSIS — J449 Chronic obstructive pulmonary disease, unspecified: Secondary | ICD-10-CM

## 2021-09-14 NOTE — Progress Notes (Signed)
Daily Session Note  Patient Details  Name: RACHARD ISIDRO MRN: [id number] Date of Birth: 02/22/1941 Referring Provider:   Flowsheet Row Cardiac Rehab from 08/25/2021 in [facility] Cardiac and Pulmonary Rehab  Referring Provider Loma Boston MD       Encounter Date: 09/14/2021  Check In:  Session Check In - 09/14/21 1345       Check-In   Supervising physician immediately available to respond to emergencies See telemetry face sheet for immediately available ER MD    Location [facility]-Cardiac & Pulmonary Rehab    Staff Present Nyoka Cowden, RN, BSN, Ardeth Sportsman, RDN, Tawanna Solo, MS, ASCM CEP, Exercise Physiologist    Virtual Visit No    Medication changes reported     No    Fall or balance concerns reported    No    Tobacco Cessation No Change    Warm-up and Cool-down Performed on first and last piece of equipment    Resistance Training Performed Yes    VAD Patient? No    PAD/SET Patient? No      Pain Assessment   Currently in Pain? No/denies                Social History   Tobacco Use  Smoking Status Former   Packs/day: 1.00   Years: 20.00   Total pack years: 20.00   Types: Cigarettes   Quit date: 03/21/1978   Years since quitting: 43.5  Smokeless Tobacco Never    Goals Met:  Independence with exercise equipment Exercise tolerated well No report of concerns or symptoms today  Goals Unmet:  Not Applicable  Comments: Pt able to follow exercise prescription today without complaint.  Will continue to monitor for progression.    Dr. Emily Filbert is Medical Director for [place].  Dr. Ottie Glazier is Medical Director for [facility] Pulmonary Rehabilitation.

## 2021-09-16 ENCOUNTER — Encounter: Payer: No Typology Code available for payment source | Admitting: *Deleted

## 2021-09-16 DIAGNOSIS — Z952 Presence of prosthetic heart valve: Secondary | ICD-10-CM

## 2021-09-16 DIAGNOSIS — Z951 Presence of aortocoronary bypass graft: Secondary | ICD-10-CM | POA: Diagnosis not present

## 2021-09-16 DIAGNOSIS — J449 Chronic obstructive pulmonary disease, unspecified: Secondary | ICD-10-CM

## 2021-09-16 NOTE — Progress Notes (Signed)
Daily Session Note  Patient Details  Name: Phillip Chavez MRN: [id number] Date of Birth: 1941-02-25 Referring Provider:   Flowsheet Row Cardiac Rehab from 08/25/2021 in [facility] Cardiac and Pulmonary Rehab  Referring Provider Loma Boston MD       Encounter Date: 09/16/2021  Check In:  Session Check In - 09/16/21 1348       Check-In   Supervising physician immediately available to respond to emergencies See telemetry face sheet for immediately available ER MD    Location [facility]-Cardiac & Pulmonary Rehab    Staff Present Nyoka Cowden, RN, BSN, Tyna Jaksch, MS, ASCM CEP, Exercise Physiologist;Susanne Bice, RN, BSN, CCRP    Virtual Visit No    Medication changes reported     No    Fall or balance concerns reported    No    Tobacco Cessation No Change    Warm-up and Cool-down Performed on first and last piece of equipment    Resistance Training Performed Yes    VAD Patient? No    PAD/SET Patient? No      Pain Assessment   Currently in Pain? No/denies                Social History   Tobacco Use  Smoking Status Former   Packs/day: 1.00   Years: 20.00   Total pack years: 20.00   Types: Cigarettes   Quit date: 03/21/1978   Years since quitting: 43.5  Smokeless Tobacco Never    Goals Met:  Independence with exercise equipment Exercise tolerated well No report of concerns or symptoms today  Goals Unmet:  Pt able to follow exercise prescription today without complaint.  Will continue to monitor for progression.   Comments:  Dr. Emily Filbert is Medical Director for [place].  Dr. Ottie Glazier is Medical Director for [facility] Pulmonary Rehabilitation.

## 2021-09-21 ENCOUNTER — Encounter: Payer: No Typology Code available for payment source | Admitting: *Deleted

## 2021-09-21 DIAGNOSIS — Z951 Presence of aortocoronary bypass graft: Secondary | ICD-10-CM | POA: Diagnosis not present

## 2021-09-21 DIAGNOSIS — J449 Chronic obstructive pulmonary disease, unspecified: Secondary | ICD-10-CM

## 2021-09-21 DIAGNOSIS — Z952 Presence of prosthetic heart valve: Secondary | ICD-10-CM

## 2021-09-21 NOTE — Progress Notes (Signed)
Daily Session Note  Patient Details  Name: Phillip Chavez MRN: [id number] Date of Birth: 11-Dec-1940 Referring Provider:   Flowsheet Row Cardiac Rehab from 08/25/2021 in [facility] Cardiac and Pulmonary Rehab  Referring Provider Loma Boston MD       Encounter Date: 09/21/2021  Check In:  Session Check In - 09/21/21 1342       Check-In   Supervising physician immediately available to respond to emergencies See telemetry face sheet for immediately available ER MD    Location [facility]-Cardiac & Pulmonary Rehab    Staff Present Nyoka Cowden, RN, BSN, Ardeth Sportsman, RDN, LDN;Meredith Sherryll Burger, RN BSN    Virtual Visit No    Medication changes reported     No    Fall or balance concerns reported    No    Tobacco Cessation No Change    Warm-up and Cool-down Performed on first and last piece of equipment    Resistance Training Performed Yes    VAD Patient? No    PAD/SET Patient? No      Pain Assessment   Currently in Pain? No/denies                Social History   Tobacco Use  Smoking Status Former   Packs/day: 1.00   Years: 20.00   Total pack years: 20.00   Types: Cigarettes   Quit date: 03/21/1978   Years since quitting: 43.5  Smokeless Tobacco Never    Goals Met:  Independence with exercise equipment Exercise tolerated well No report of concerns or symptoms today  Goals Unmet:  Not Applicable  Comments: Pt able to follow exercise prescription today without complaint.  Will continue to monitor for progression.    Dr. Emily Filbert is Medical Director for [place].  Dr. Ottie Glazier is Medical Director for [facility] Pulmonary Rehabilitation.

## 2021-09-24 ENCOUNTER — Encounter: Payer: No Typology Code available for payment source | Admitting: *Deleted

## 2021-09-24 DIAGNOSIS — Z952 Presence of prosthetic heart valve: Secondary | ICD-10-CM

## 2021-09-24 DIAGNOSIS — Z951 Presence of aortocoronary bypass graft: Secondary | ICD-10-CM | POA: Diagnosis not present

## 2021-09-24 NOTE — Progress Notes (Signed)
Daily Session Note  Patient Details  Name: Phillip Chavez MRN: [id number] Date of Birth: August 05, 1940 Referring Provider:   Flowsheet Row Cardiac Rehab from 08/25/2021 in [facility] Cardiac and Pulmonary Rehab  Referring Provider Loma Boston MD       Encounter Date: 09/24/2021  Check In:  Session Check In - 09/24/21 1321       Check-In   Supervising physician immediately available to respond to emergencies See telemetry face sheet for immediately available ER MD    Location [facility]-Cardiac & Pulmonary Rehab    Staff Present Alberteen Sam, MA, RCEP, CCRP, CCET;Melissa [place], RDN, LDN;Tiersa Dayley Sherryll Burger, RN BSN    Virtual Visit No    Medication changes reported     No    Fall or balance concerns reported    No    Warm-up and Cool-down Performed on first and last piece of equipment    Resistance Training Performed Yes    VAD Patient? No    PAD/SET Patient? No      Pain Assessment   Currently in Pain? No/denies                Social History   Tobacco Use  Smoking Status Former   Packs/day: 1.00   Years: 20.00   Total pack years: 20.00   Types: Cigarettes   Quit date: 03/21/1978   Years since quitting: 43.5  Smokeless Tobacco Never    Goals Met:  Independence with exercise equipment Exercise tolerated well No report of concerns or symptoms today Strength training completed today  Goals Unmet:  Not Applicable  Comments: Pt able to follow exercise prescription today without complaint.  Will continue to monitor for progression.    Dr. Emily Filbert is Medical Director for [place].  Dr. Ottie Glazier is Medical Director for [facility] Pulmonary Rehabilitation.

## 2021-09-28 ENCOUNTER — Encounter: Payer: No Typology Code available for payment source | Admitting: *Deleted

## 2021-09-28 DIAGNOSIS — Z951 Presence of aortocoronary bypass graft: Secondary | ICD-10-CM | POA: Diagnosis not present

## 2021-09-28 DIAGNOSIS — J449 Chronic obstructive pulmonary disease, unspecified: Secondary | ICD-10-CM

## 2021-09-28 DIAGNOSIS — Z952 Presence of prosthetic heart valve: Secondary | ICD-10-CM

## 2021-09-28 NOTE — Progress Notes (Signed)
Daily Session Note  Patient Details  Name: Phillip Chavez MRN: [id number] Date of Birth: 12-Aug-1940 Referring Provider:   Flowsheet Row Cardiac Rehab from 08/25/2021 in [facility] Cardiac and Pulmonary Rehab  Referring Provider Loma Boston MD       Encounter Date: 09/28/2021  Check In:  Session Check In - 09/28/21 1349       Check-In   Supervising physician immediately available to respond to emergencies See telemetry face sheet for immediately available ER MD    Location [facility]-Cardiac & Pulmonary Rehab    Staff Present Nyoka Cowden, RN, BSN, MA;Meredith Sherryll Burger, RN BSN    Virtual Visit No    Medication changes reported     No    Fall or balance concerns reported    No    Tobacco Cessation No Change    Warm-up and Cool-down Performed on first and last piece of equipment    Resistance Training Performed Yes    VAD Patient? No    PAD/SET Patient? No      Pain Assessment   Currently in Pain? No/denies                Social History   Tobacco Use  Smoking Status Former   Packs/day: 1.00   Years: 20.00   Total pack years: 20.00   Types: Cigarettes   Quit date: 03/21/1978   Years since quitting: 43.5  Smokeless Tobacco Never    Goals Met:  Independence with exercise equipment Exercise tolerated well No report of concerns or symptoms today  Goals Unmet:  Not Applicable  Comments: Pt able to follow exercise prescription today without complaint.  Will continue to monitor for progression.    Dr. Emily Filbert is Medical Director for [place].  Dr. Ottie Glazier is Medical Director for [facility] Pulmonary Rehabilitation.

## 2021-09-30 ENCOUNTER — Encounter: Payer: No Typology Code available for payment source | Attending: Physician Assistant | Admitting: *Deleted

## 2021-09-30 DIAGNOSIS — Z951 Presence of aortocoronary bypass graft: Secondary | ICD-10-CM | POA: Diagnosis not present

## 2021-09-30 DIAGNOSIS — Z952 Presence of prosthetic heart valve: Secondary | ICD-10-CM | POA: Diagnosis not present

## 2021-09-30 DIAGNOSIS — J449 Chronic obstructive pulmonary disease, unspecified: Secondary | ICD-10-CM | POA: Diagnosis present

## 2021-09-30 NOTE — Progress Notes (Signed)
Daily Session Note  Patient Details  Name: Phillip Chavez MRN: [id number] Date of Birth: [birth year] Referring Provider:   Flowsheet Row Cardiac Rehab from 08/25/2021 in [facility] Cardiac and Pulmonary Rehab  Referring Provider Loma Boston MD       Encounter Date: 09/30/2021  Check In:  Session Check In - 09/30/21 1337       Check-In   Supervising physician immediately available to respond to emergencies See telemetry face sheet for immediately available ER MD    Location [facility]-Cardiac & Pulmonary Rehab    Staff Present Nyoka Cowden, RN, BSN, MA;Meredith Sherryll Burger, RN BSN    Virtual Visit No    Medication changes reported     No    Fall or balance concerns reported    No    Tobacco Cessation Use Increase    Warm-up and Cool-down Performed on first and last piece of equipment    Resistance Training Performed Yes    VAD Patient? No    PAD/SET Patient? No                Social History   Tobacco Use  Smoking Status Former   Packs/day: 1.00   Years: 20.00   Total pack years: 20.00   Types: Cigarettes   Quit date: 03/21/1978   Years since quitting: 43.5  Smokeless Tobacco Never    Goals Met:  Independence with exercise equipment Personal goals reviewed No report of concerns or symptoms today  Goals Unmet:  Not Applicable  Comments: Pt able to follow exercise prescription today without complaint.  Will continue to monitor for progression.    Dr. Emily Filbert is Medical Director for [place].  Dr. Ottie Glazier is Medical Director for [facility] Pulmonary Rehabilitation.

## 2021-10-05 ENCOUNTER — Encounter: Payer: No Typology Code available for payment source | Admitting: *Deleted

## 2021-10-05 DIAGNOSIS — Z952 Presence of prosthetic heart valve: Secondary | ICD-10-CM

## 2021-10-05 DIAGNOSIS — J449 Chronic obstructive pulmonary disease, unspecified: Secondary | ICD-10-CM

## 2021-10-05 DIAGNOSIS — Z951 Presence of aortocoronary bypass graft: Secondary | ICD-10-CM

## 2021-10-05 NOTE — Progress Notes (Signed)
Completed initial RD consultation ?

## 2021-10-05 NOTE — Progress Notes (Signed)
Daily Session Note  Patient Details  Name: Phillip Chavez MRN: [id number] Date of Birth: 08-22-1940 Referring Provider:   Flowsheet Row Cardiac Rehab from 08/25/2021 in [facility] Cardiac and Pulmonary Rehab  Referring Provider Loma Boston MD       Encounter Date: 10/05/2021  Check In:  Session Check In - 10/05/21 1420       Check-In   Supervising physician immediately available to respond to emergencies See telemetry face sheet for immediately available ER MD    Location [facility]-Cardiac & Pulmonary Rehab    Staff Present Nyoka Cowden, RN, BSN, [place], BS, RRT, CPFT;Melissa [place], RDN, LDN    Virtual Visit No    Medication changes reported     No    Fall or balance concerns reported    No    Tobacco Cessation No Change    Warm-up and Cool-down Performed on first and last piece of equipment    Resistance Training Performed Yes    VAD Patient? No    PAD/SET Patient? No      Pain Assessment   Currently in Pain? No/denies                Social History   Tobacco Use  Smoking Status Former   Packs/day: 1.00   Years: 20.00   Total pack years: 20.00   Types: Cigarettes   Quit date: 03/21/1978   Years since quitting: 43.5  Smokeless Tobacco Never    Goals Met:  Independence with exercise equipment Exercise tolerated well No report of concerns or symptoms today  Goals Unmet:  Not Applicable  Comments: Pt able to follow exercise prescription today without complaint.  Will continue to monitor for progression.    Dr. Emily Filbert is Medical Director for .  Dr. Ottie Glazier is Medical Director for [facility] Pulmonary Rehabilitation.

## 2021-10-07 ENCOUNTER — Encounter: Payer: No Typology Code available for payment source | Admitting: *Deleted

## 2021-10-07 ENCOUNTER — Encounter: Payer: Self-pay | Admitting: *Deleted

## 2021-10-07 DIAGNOSIS — Z952 Presence of prosthetic heart valve: Secondary | ICD-10-CM

## 2021-10-07 DIAGNOSIS — Z951 Presence of aortocoronary bypass graft: Secondary | ICD-10-CM

## 2021-10-07 DIAGNOSIS — J449 Chronic obstructive pulmonary disease, unspecified: Secondary | ICD-10-CM | POA: Diagnosis not present

## 2021-10-07 NOTE — Progress Notes (Signed)
Cardiac Individual Treatment Plan  Patient Details  Name: Phillip Chavez MRN: [id number] Date of Birth: [birth year] Referring Provider:   Flowsheet Row Cardiac Rehab from 08/25/2021 in [facility] Cardiac and Pulmonary Rehab  Referring Provider Loma Boston MD       Initial Encounter Date:  Flowsheet Row Cardiac Rehab from 08/25/2021 in [facility] Cardiac and Pulmonary Rehab  Date 08/25/21       Visit Diagnosis: S/P CABG x 1  S/P AVR (aortic valve replacement)  Patient's Home Medications on Admission:  Current Outpatient Medications:    albuterol (VENTOLIN HFA) 108 (90 Base) MCG/ACT inhaler, Inhale 2 puffs into the lungs every 6 (six) hours as needed for wheezing or shortness of breath., Disp: , Rfl:    ascorbic acid (VITAMIN C) 500 MG tablet, Take by mouth., Disp: , Rfl:    bisacodyl (DULCOLAX) 10 MG suppository, Place 10 mg rectally as needed for moderate constipation. (Patient not taking: Reported on 05/26/2021), Disp: , Rfl:    calamine lotion, Apply 1 application topically as needed for itching. (Patient not taking: Reported on 05/26/2021), Disp: , Rfl:    cholecalciferol (VITAMIN D3) 25 MCG (1000 UNIT) tablet, Take 1,000 Units by mouth daily., Disp: , Rfl:    dronabinol (MARINOL) 5 MG capsule, Take 5 mg by mouth 2 (two) times daily before a meal. (Patient not taking: Reported on 08/17/2021), Disp: , Rfl:    epoetin alfa (EPOGEN) 10000 UNIT/ML injection, Inject 1 mL (10,000 Units total) into the vein every Monday, Wednesday, and Friday with hemodialysis. (Patient not taking: Reported on 05/26/2021), Disp: 1 mL, Rfl:    ferrous sulfate 325 (65 FE) MG tablet, Take 1 tablet (325 mg total) by mouth daily with breakfast., Disp: , Rfl: 3   fluticasone (FLONASE) 50 MCG/ACT nasal spray, INSTILL 1 SPRAY INTO EACH NOSTRIL EVERY DAY MAXIMUM 2 SPRAYS IN EACH NOSTRIL DAILY. FOR ALLERGIES, Disp: , Rfl:    fluticasone furoate-vilanterol (BREO ELLIPTA) 200-25 MCG/ACT AEPB, Inhale 1 puff into the lungs daily.,  Disp: , Rfl:    ketotifen (ZADITOR) 0.025 % ophthalmic solution, 1 drop 2 (two) times daily., Disp: , Rfl:    losartan (COZAAR) 25 MG tablet, TAKE ONE TABLET BY MOUTH TWO TIMES A DAY FOR BLOOD PRESSURE, Disp: , Rfl:    metoCLOPramide (REGLAN) 5 MG tablet, Take 5 mg by mouth 4 (four) times daily -  before meals and at bedtime. (Patient not taking: Reported on 05/26/2021), Disp: , Rfl:    metoprolol tartrate (LOPRESSOR) 25 MG tablet, TAKE ONE-HALF TABLET BY MOUTH TWO TIMES A DAY FOR HIGH BLOOD PRESSURE, Disp: , Rfl:    Nutritional Supplements (FEEDING SUPPLEMENT, NEPRO CARB STEADY,) LIQD, Take 237 mLs by mouth 3 (three) times daily between meals. (Patient not taking: Reported on 08/17/2021), Disp: , Rfl: 0   nystatin (MYCOSTATIN/NYSTOP) powder, Apply 1 application topically 2 (two) times daily. (Patient not taking: Reported on 05/26/2021), Disp: , Rfl:    omega-3 acid ethyl esters (LOVAZA) 1 g capsule, Take 3 g by mouth 2 (two) times daily. (Patient not taking: Reported on 08/17/2021), Disp: , Rfl:    Omega-3 Fatty Acids (FISH OIL) 1000 MG CAPS, TAKE 3 CAPSULES (SEA-OMEGA 30 FISH OIL) BY MOUTH TWO TIMES A DAY FOR CHOLESTEROL, Disp: , Rfl:    pantoprazole (PROTONIX) 40 MG tablet, 1 tablet daily. (Patient not taking: Reported on 05/26/2021), Disp: , Rfl:    polyethylene glycol (MIRALAX / GLYCOLAX) 17 g packet, Take 17 g by mouth 2 (two) times daily., Disp: ,  Rfl: 0   rosuvastatin (CRESTOR) 40 MG tablet, Take 40 mg by mouth daily., Disp: , Rfl:    tamsulosin (FLOMAX) 0.4 MG CAPS capsule, Take 1 capsule (0.4 mg total) by mouth daily. (Patient not taking: Reported on 05/26/2021), Disp: , Rfl:   Past Medical History: Past Medical History:  Diagnosis Date   Chronic CHF (congestive heart failure) (HCC)    COPD (chronic obstructive pulmonary disease) (HCC)    GERD (gastroesophageal reflux disease)    HLD (hyperlipidemia)    HTN (hypertension)    Presence of permanent cardiac pacemaker     Tobacco  Use: Social History   Tobacco Use  Smoking Status Former   Packs/day: 1.00   Years: 20.00   Total pack years: 20.00   Types: Cigarettes   Quit date: 03/21/1978   Years since quitting: 43.5  Smokeless Tobacco Never    Labs: Review Flowsheet       Latest Ref Rng & Units 01/04/2021 01/05/2021 01/11/2021  Labs for ITP Cardiac and Pulmonary Rehab  Cholestrol 0 - 200 mg/dL 123  - -  LDL (calc) 0 - 99 mg/dL 86  - -  HDL-C >40 mg/dL 13  - -  Trlycerides <150 mg/dL 120  - -  Hemoglobin A1c 4.8 - 5.6 % - 6.1  -  PH, Arterial 7.350 - 7.450 - - 7.40   PCO2 arterial 32.0 - 48.0 mmHg - - 34   Bicarbonate 20.0 - 28.0 mmol/L - - 21.1   Acid-base deficit 0.0 - 2.0 mmol/L - - 3.2   O2 Saturation % - - 98.9      Exercise Target Goals: Exercise Program Goal: Individual exercise prescription set using results from initial 6 min walk test and THRR while considering  patient's activity barriers and safety.   Exercise Prescription Goal: Initial exercise prescription builds to 30-45 minutes a day of aerobic activity, 2-3 days per week.  Home exercise guidelines will be given to patient during program as part of exercise prescription that the participant will acknowledge.   Education: Aerobic Exercise: - Group verbal and visual presentation on the components of exercise prescription. Introduces F.I.T.T principle from ACSM for exercise prescriptions.  Reviews F.I.T.T. principles of aerobic exercise including progression. Written material given at graduation. Flowsheet Row Cardiac Rehab from 09/30/2021 in [facility] Cardiac and Pulmonary Rehab  Date 09/02/21  Educator Webster  Instruction Review Code 1- Verbalizes Understanding       Education: Resistance Exercise: - Group verbal and visual presentation on the components of exercise prescription. Introduces F.I.T.T principle from ACSM for exercise prescriptions  Reviews F.I.T.T. principles of resistance exercise including progression. Written material given  at graduation. Flowsheet Row Pulmonary Rehab from 03/19/2020 in [facility] Cardiac and Pulmonary Rehab  Date 02/20/20  Educator AS  Instruction Review Code 1- Verbalizes Understanding        Education: Exercise & Equipment Safety: - Individual verbal instruction and demonstration of equipment use and safety with use of the equipment. Flowsheet Row Cardiac Rehab from 09/30/2021 in [facility] Cardiac and Pulmonary Rehab  Education need identified 08/25/21  Date 08/25/21  Educator Vass  Instruction Review Code 1- Verbalizes Understanding       Education: Exercise Physiology & General Exercise Guidelines: - Group verbal and written instruction with models to review the exercise physiology of the cardiovascular system and associated critical values. Provides general exercise guidelines with specific guidelines to those with heart or lung disease.  Flowsheet Row Pulmonary Rehab from 03/19/2020 in [facility] Cardiac and Pulmonary Rehab  Date 02/13/20  Educator AS  Instruction Review Code 1- Verbalizes Understanding       Education: Flexibility, Balance, Mind/Body Relaxation: - Group verbal and visual presentation with interactive activity on the components of exercise prescription. Introduces F.I.T.T principle from ACSM for exercise prescriptions. Reviews F.I.T.T. principles of flexibility and balance exercise training including progression. Also discusses the mind body connection.  Reviews various relaxation techniques to help reduce and manage stress (i.e. Deep breathing, progressive muscle relaxation, and visualization). Balance handout provided to take home. Written material given at graduation. Flowsheet Row Pulmonary Rehab from 03/19/2020 in [facility] Cardiac and Pulmonary Rehab  Date 01/02/20  Educator AS  Instruction Review Code 1- Verbalizes Understanding       Activity Barriers & Risk Stratification:  Activity Barriers & Cardiac Risk Stratification - 08/25/21 1159       Activity Barriers & Cardiac  Risk Stratification   Activity Barriers Shortness of Breath;Deconditioning;Muscular Weakness;Joint Problems;Other (comment)    Comments Right shoulder limited ROM    Cardiac Risk Stratification High             6 Minute Walk:  6 Minute Walk     Row Name 08/25/21 1200         6 Minute Walk   Phase Initial     Distance 800 feet     Walk Time 6 minutes     # of Rest Breaks 0     MPH 1.51     METS 1.19     RPE 13     Perceived Dyspnea  0     VO2 Peak 4.19     Symptoms Yes (comment)     Comments Fatigue, Bilateral tightness in hips     Resting HR 72 bpm     Resting BP 108/58     Resting Oxygen Saturation  97 %     Exercise Oxygen Saturation  during 6 min walk 98 %     Max Ex. HR 93 bpm     Max Ex. BP 122/64     2 Minute Post BP 112/58              Oxygen Initial Assessment:   Oxygen Re-Evaluation:   Oxygen Discharge (Final Oxygen Re-Evaluation):   Initial Exercise Prescription:  Initial Exercise Prescription - 08/25/21 1400       Date of Initial Exercise RX and Referring Provider   Date 08/25/21    Referring Provider Loma Boston MD      Oxygen   Maintain Oxygen Saturation 88% or higher      Treadmill   MPH 1.3    Grade 0    Minutes 15    METs 2      NuStep   Level 1    SPM 80    Minutes 15    METs 1.1      T5 Nustep   Level 1    SPM 80    Minutes 15    METs 1.1      Biostep-RELP   Level 1    SPM 50    Minutes 15    METs 1.1      Prescription Details   Frequency (times per week) 2    Duration Progress to 30 minutes of continuous aerobic without signs/symptoms of physical distress      Intensity   THRR 40-80% of Max Heartrate 98 - 124    Ratings of Perceived Exertion 11-13    Perceived Dyspnea 0-4  Progression   Progression Continue to progress workloads to maintain intensity without signs/symptoms of physical distress.      Resistance Training   Training Prescription Yes    Weight 3 lb    Reps 10-15              Perform Capillary Blood Glucose checks as needed.  Exercise Prescription Changes:   Exercise Prescription Changes     Row Name 08/25/21 1400 09/07/21 0800 09/21/21 0800 09/21/21 1500 10/05/21 0800     Response to Exercise   Blood Pressure (Admit) 108/58 128/64 114/62 -- 120/62   Blood Pressure (Exercise) 122/64 184/64 138/62 -- 124/68   Blood Pressure (Exit) 112/58 110/62 110/62 -- 106/70   Heart Rate (Admit) 72 bpm 71 bpm 67 bpm -- 53 bpm   Heart Rate (Exercise) 93 bpm 99 bpm 106 bpm -- 108 bpm   Heart Rate (Exit) 69 bpm 68 bpm 80 bpm -- 62 bpm   Oxygen Saturation (Admit) 97 % -- -- -- --   Oxygen Saturation (Exercise) 98 % -- -- -- --   Oxygen Saturation (Exit) 96 % -- -- -- --   Rating of Perceived Exertion (Exercise) _0 -- 13   Perceived Dyspnea (Exercise) 0 -- -- -- --   Symptoms Fatigue, bilateral tightness in hips fatigue none -- none   Comments walk test results first full day of exercise -- -- --   Duration -- Continue with 30 min of aerobic exercise without signs/symptoms of physical distress. Continue with 30 min of aerobic exercise without signs/symptoms of physical distress. -- Continue with 30 min of aerobic exercise without signs/symptoms of physical distress.   Intensity -- THRR unchanged THRR unchanged -- THRR unchanged     Progression   Progression -- Continue to progress workloads to maintain intensity without signs/symptoms of physical distress. Continue to progress workloads to maintain intensity without signs/symptoms of physical distress. -- Continue to progress workloads to maintain intensity without signs/symptoms of physical distress.   Average METs -- 1.75 2.06 -- 2.26     Resistance Training   Training Prescription -- Yes Yes -- Yes   Weight -- 3 lb 3 lb -- 3 lb   Reps -- 10-15 10-15 -- 10-15     Interval Training   Interval Training -- No No -- No     Treadmill   MPH -- -- 1.3 -- 1.5   Grade -- -- 1 -- 1   Minutes -- -- 15 -- 15   METs  -- -- 2.17 -- 2.35     Recumbant Bike   Level -- -- -- -- 1   Watts -- -- -- -- 16   Minutes -- -- -- -- 15   METs -- -- -- -- 2.71     NuStep   Level -- 1 2 -- 1   Minutes -- 15 15 -- 15   METs -- 1.5 2.1 -- 2.3     T5 Nustep   Level -- -- 1 -- 1   Minutes -- -- 15 -- 15   METs -- -- -- -- 2.35     Biostep-RELP   Level -- 1 1 -- 1   Minutes -- 15 15 -- 15   METs -- 2 2 -- 2     Home Exercise Plan   Plans to continue exercise at -- -- -- [employer]nsic [employer] (comment)  [employer] (comment)  [employer]   Frequency -- -- -- Add  2 additional days to program exercise sessions. Add 2 additional days to program exercise sessions.   Initial Home Exercises Provided -- -- -- 09/21/21 09/21/21     Oxygen   Maintain Oxygen Saturation -- 88% or higher 88% or higher 88% or higher 88% or higher            Exercise Comments:   Exercise Comments     Row Name 09/02/21 1332           Exercise Comments First full day of exercise!  Patient was oriented to gym and equipment including functions, settings, policies, and procedures.  Patient's individual exercise prescription and treatment plan were reviewed.  All starting workloads were established based on the results of the 6 minute walk test done at initial orientation visit.  The plan for exercise progression was also introduced and progression will be customized based on patient's performance and goals.                Exercise Goals and Review:   Exercise Goals     Row Name 08/25/21 1426             Exercise Goals   Increase Physical Activity Yes       Intervention Provide advice, education, support and counseling about physical activity/exercise needs.;Develop an individualized exercise prescription for aerobic and resistive training based on initial evaluation findings, risk stratification, comorbidities and participant's personal goals.       Expected Outcomes Short Term: Attend rehab on a  regular basis to increase amount of physical activity.;Long Term: Add in home exercise to make exercise part of routine and to increase amount of physical activity.;Long Term: Exercising regularly at least 3-5 days a week.       Increase Strength and Stamina Yes       Intervention Provide advice, education, support and counseling about physical activity/exercise needs.;Develop an individualized exercise prescription for aerobic and resistive training based on initial evaluation findings, risk stratification, comorbidities and participant's personal goals.       Expected Outcomes Short Term: Increase workloads from initial exercise prescription for resistance, speed, and METs.;Short Term: Perform resistance training exercises routinely during rehab and add in resistance training at home;Long Term: Improve cardiorespiratory fitness, muscular endurance and strength as measured by increased METs and functional capacity (6MWT)       Able to understand and use rate of perceived exertion (RPE) scale Yes       Intervention Provide education and explanation on how to use RPE scale       Expected Outcomes Short Term: Able to use RPE daily in rehab to express subjective intensity level;Long Term:  Able to use RPE to guide intensity level when exercising independently       Able to understand and use Dyspnea scale Yes       Intervention Provide education and explanation on how to use Dyspnea scale       Expected Outcomes Short Term: Able to use Dyspnea scale daily in rehab to express subjective sense of shortness of breath during exertion;Long Term: Able to use Dyspnea scale to guide intensity level when exercising independently       Knowledge and understanding of Target Heart Rate Range (THRR) Yes       Intervention Provide education and explanation of THRR including how the numbers were predicted and where they are located for reference       Expected Outcomes Short Term: Able to state/look up THRR;Short Term:  Able to  use daily as guideline for intensity in rehab;Long Term: Able to use THRR to govern intensity when exercising independently       Able to check pulse independently Yes       Intervention Provide education and demonstration on how to check pulse in carotid and radial arteries.;Review the importance of being able to check your own pulse for safety during independent exercise       Expected Outcomes Short Term: Able to explain why pulse checking is important during independent exercise;Long Term: Able to check pulse independently and accurately       Understanding of Exercise Prescription Yes       Intervention Provide education, explanation, and written materials on patient's individual exercise prescription       Expected Outcomes Short Term: Able to explain program exercise prescription;Long Term: Able to explain home exercise prescription to exercise independently                Exercise Goals Re-Evaluation :  Exercise Goals Re-Evaluation     Row Name 09/02/21 1332 09/07/21 0824 09/21/21 0851 09/21/21 1445 10/05/21 0807     Exercise Goal Re-Evaluation   Exercise Goals Review Increase Physical Activity;Able to understand and use rate of perceived exertion (RPE) scale;Knowledge and understanding of Target Heart Rate Range (THRR);Understanding of Exercise Prescription;Able to check pulse independently;Able to understand and use Dyspnea scale;Increase Strength and Stamina Increase Physical Activity;Increase Strength and Stamina;Understanding of Exercise Prescription Increase Physical Activity;Increase Strength and Stamina;Understanding of Exercise Prescription Increase Physical Activity;Increase Strength and Stamina;Understanding of Exercise Prescription Increase Physical Activity;Increase Strength and Stamina;Understanding of Exercise Prescription   Comments Reviewed RPE and dyspnea scales, THR and program prescription with pt today.  Pt voiced understanding and was given a copy of goals  to take home. Lucy has completed his first full day of exercise!  He was able to his full 30 minutes on his first day.  We will continue to monitor his progression. Kalijah continues to do well in rehab. He added on a 1% incline on the treadmill and was able to increase to level 2 on the T4 Nustep. We will continue to monitor. Reviewed home exercise with pt today.  Pt plans to walk and go to [employer] for exercise. Harjot has a membership to [employer] and wants to start getting back into it. Reviewed THR, pulse, RPE, sign and symptoms, pulse oximetery and when to call 911 or MD.  Also discussed weather considerations and indoor options.  Pt voiced understanding. Advaith is doing well in rehab. He recently improved his overall average MET level to 2.26 METs. He also improved his speed on the treadmill to 1.5 mph while maintaining an incline of 1%. He has also tolerated 3 lb hand weights for resistance training as well. We will continue to monitor his progress in the program.   Expected Outcomes Short: Use RPE daily to regulate intensity. Long: Follow program prescription in THR. Short: Continue to attend rehab regularly Long: Continue to follow program prescription Short: Continue to increase workloads on treadmill Long: Continue to increase overall MET level Short: Start 1 day at [place]: Exercise independently at home at appropriate prescription Short: Continue to increase speed on treadmill. Long: Continue to improve strength and stamina.            Discharge Exercise Prescription (Final Exercise Prescription Changes):  Exercise Prescription Changes - 10/05/21 0800       Response to Exercise   Blood Pressure (Admit) 120/62  Blood Pressure (Exercise) 124/68    Blood Pressure (Exit) 106/70    Heart Rate (Admit) 53 bpm    Heart Rate (Exercise) 108 bpm    Heart Rate (Exit) 62 bpm    Rating of Perceived Exertion (Exercise) 13    Symptoms none    Duration Continue with 30  min of aerobic exercise without signs/symptoms of physical distress.    Intensity THRR unchanged      Progression   Progression Continue to progress workloads to maintain intensity without signs/symptoms of physical distress.    Average METs 2.26      Resistance Training   Training Prescription Yes    Weight 3 lb    Reps 10-15      Interval Training   Interval Training No      Treadmill   MPH 1.5    Grade 1    Minutes 15    METs 2.35      Recumbant Bike   Level 1    Watts 16    Minutes 15    METs 2.71      NuStep   Level 1    Minutes 15    METs 2.3      T5 Nustep   Level 1    Minutes 15    METs 2.35      Biostep-RELP   Level 1    Minutes 15    METs 2      Home Exercise Plan   Plans to continue exercise at [employer] (comment)   [employer]   Frequency Add 2 additional days to program exercise sessions.    Initial Home Exercises Provided 09/21/21      Oxygen   Maintain Oxygen Saturation 88% or higher             Nutrition:  Target Goals: Understanding of nutrition guidelines, daily intake of sodium <1533m, cholesterol <2011m calories 30% from fat and 7% or less from saturated fats, daily to have 5 or more servings of fruits and vegetables.  Education: All About Nutrition: -Group instruction provided by verbal, written material, interactive activities, discussions, models, and posters to present general guidelines for heart healthy nutrition including fat, fiber, MyPlate, the role of sodium in heart healthy nutrition, utilization of the nutrition label, and utilization of this knowledge for meal planning. Follow up email sent as well. Written material given at graduation. Flow[facility]  Education need identified 08/25/21       Biometrics:  Pre Biometrics - 08/25/21 1159       Pre Biometrics   Height 5' 3.5" (1.613 m)    Weight 155 lb 1.6 oz (70.4 kg)    BMI (Calculated)  27.04    Single Leg Stand 2.59 seconds              Nutrition Therapy Plan and Nutrition Goals:  Nutrition Therapy & Goals - 10/05/21 1107       Nutrition Therapy   Diet Heart healhty, low Na, Pulmonary MNT    Drug/Food Interactions Statins/Certain Fruits    Protein (specify units) 85g    Fiber 30 grams    Whole Grain Foods 3 servings    Saturated Fats 16 max. grams    Fruits and Vegetables 8 servings/day    Sodium 2 grams      Personal Nutrition Goals   Nutrition Goal ST: add protein to breakfast, practice MyPlate guidlines for meals LT: Maintain  kidney function, create meals with MyPlate structure, meet protein needs.    Comments 81 y.o. M admitted to cardiac rehab s/p CABG x1. PMHx includes CKD stg 3, secondary hyperparathyroidism, anemia, COPD, GERD, HTN, HLD. Relevant medications includes vit C, vit D3, dulcolax, epoetin, dronabinol, ferrous sulfate, reglan, nepro, omega-3 , omeprazole, miralax, crestor. Andri was on dialyisis, but since his kidney function is improving he has stopped; he reports now his doctors will check in for a follow-up every 6 months. Discussed healthy eating for CKD stg 3. Joshau and his wife are trying to stay away from bread as they feel they eat too many sandwiches. They go out to eat <2-3x/week, will sometimes get fast food or fish or chicken. He reports using liquid plant oil (canola) and sometimes butter and they limit salt and have tried to cut back. Food recall: Caprice reports difficulty remembering what he has for lunch and dinner most days as it can vary. B: oatmeal with brown sugar or cheerios or corn fakes sometimes or sometimes gravy with toast L: fast food (whopper) or wife will cook S: will not always snack: sometimes salsa or chocolate pudding D: soup (canned - chicken noodle), wife will cook chicken or pork chop with vegetables. he reports issues swallowing rice as he feels it slows down in his throat, but reports no other issues with anything  else; suggested trying to pair rice with liquid such as sauce to add moisture - if the problem persists, consider limiting rice. Drinks: diet drinks, milk, or sweet tea. However, he will mostly have water; he does not have the nepro as often as he does not like it. Discussed heart healthy as well as suggested including protein with breakfast: peanut butter or eggs for example, encouraged vairety of fruits and vegetables, and provided examples for quick and easy meals as he reports they will cook less (microwavable grains, baked potatoes and sweet potatoes, frozen vegetables, canned chicken, or rotisserie chicken).      Intervention Plan   Intervention Prescribe, educate and counsel regarding individualized specific dietary modifications aiming towards targeted core components such as weight, hypertension, lipid management, diabetes, heart failure and other comorbidities.    Expected Outcomes Short Term Goal: Understand basic principles of dietary content, such as calories, fat, sodium, cholesterol and nutrients.;Short Term Goal: A plan has been developed with personal nutrition goals set during dietitian appointment.;Long Term Goal: Adherence to prescribed nutrition plan.             Nutrition Assessments:  MEDIFICTS Score Key: ?70 Need to make dietary changes  40-70 Heart Healthy Diet ? 40 Therapeutic Level Cholesterol Diet  Flowsheet Row Cardiac Rehab from 08/25/2021 in [name] Cardiac and Pulmonary Rehab  Picture Your Plate Total Score on Admission 54      Picture Your Plate Scores: <92 Unhealthy dietary pattern with much room for improvement. 41-50 Dietary pattern unlikely to meet recommendations for good health and room for improvement. 51-60 More healthful dietary pattern, with some room for improvement.  >60 Healthy dietary pattern, although there may be some specific behaviors that could be improved.    Nutrition Goals Re-Evaluation:  Nutrition Goals Re-Evaluation     [place] Name  09/21/21 1343             Goals   Nutrition Goal Patient is scheduled for appointmenmt with the RD on 8/1.       Expected Outcome Short: Meet with RD Long: Continue to eat heart healthy diet  Nutrition Goals Discharge (Final Nutrition Goals Re-Evaluation):  Nutrition Goals Re-Evaluation - 09/21/21 1343       Goals   Nutrition Goal Patient is scheduled for appointmenmt with the RD on 8/1.    Expected Outcome Short: Meet with RD Long: Continue to eat heart healthy diet             Psychosocial: Target Goals: Acknowledge presence or absence of significant depression and/or stress, maximize coping skills, provide positive support system. Participant is able to verbalize types and ability to use techniques and skills needed for reducing stress and depression.   Education: Stress, Anxiety, and Depression - Group verbal and visual presentation to define topics covered.  Reviews how body is impacted by stress, anxiety, and depression.  Also discusses healthy ways to reduce stress and to treat/manage anxiety and depression.  Written material given at graduation.   Education: Sleep Hygiene -Provides group verbal and written instruction about how sleep can affect your health.  Define sleep hygiene, discuss sleep cycles and impact of sleep habits. Review good sleep hygiene tips.    Initial Review & Psychosocial Screening:  Initial Psych Review & Screening - 08/17/21 1516       Initial Review   Current issues with None Identified      Family Dynamics   Good Support System? Yes   wife  son     Barriers   Psychosocial barriers to participate in program There are no identifiable barriers or psychosocial needs.      Screening Interventions   Interventions Encouraged to exercise    Expected Outcomes Short Term goal: Utilizing psychosocial counselor, staff and physician to assist with identification of specific Stressors or current issues interfering with healing  process. Setting desired goal for each stressor or current issue identified.;Long Term Goal: Stressors or current issues are controlled or eliminated.;Short Term goal: Identification and review with participant of any Quality of Life or Depression concerns found by scoring the questionnaire.;Long Term goal: The participant improves quality of Life and PHQ9 Scores as seen by post scores and/or verbalization of changes             Quality of Life Scores:   Quality of Life - 09/07/21 1335       Quality of Life   Select Quality of Life      Quality of Life Scores   Health/Function Pre 21.77 %    Socioeconomic Pre 26.88 %    Psych/Spiritual Pre 28.43 %    Family Pre 28.8 %    GLOBAL Pre 25.27 %            Scores of 19 and below usually indicate a poorer quality of life in these areas.  A difference of  2-3 points is a clinically meaningful difference.  A difference of 2-3 points in the total score of the Quality of Life Index has been associated with significant improvement in overall quality of life, self-image, physical symptoms, and general health in studies assessing change in quality of life.  PHQ-9: Review Flowsheet       09/30/2021 08/25/2021 01/17/2020 11/22/2019  Depression screen PHQ 2/9  Decreased Interest 1 2 0 0  Down, Depressed, Hopeless 1 0 0 0  PHQ - 2 Score 2 2 0 0  Altered sleeping 0 0 0 2  Tired, decreased energy 1 3 0 1  Change in appetite 1 0 2 2  Feeling bad or failure about yourself  0 1 0 0  Trouble concentrating 0  0 0 0  Moving slowly or fidgety/restless 0 0 0 0  Suicidal thoughts 0 0 0 0  PHQ-9 Score _0 Difficult doing work/chores Not difficult at all Somewhat difficult Not difficult at all Not difficult at all   Interpretation of Total Score  Total Score Depression Severity:  1-4 = Minimal depression, 5-9 = Mild depression, 10-14 = Moderate depression, 15-19 = Moderately severe depression, 20-27 = Severe depression   Psychosocial Evaluation  and Intervention:   Psychosocial Re-Evaluation:  Psychosocial Re-Evaluation     [place] Name 09/21/21 1403             Psychosocial Re-Evaluation   Current issues with Current Stress Concerns       Comments Jo is doing well mentally overall. His son is getting surgery as he has a cyst on throat in a couple months, but states he is not too worried. His wife is also getting testing for multiple health conditions. However, he feels he manages hia stress fairly good. He does not take any medications for depression/anxiety. He is trying to get back into [employer] with his wife again for exercise. He felt he had such a setback with his heart event and is still getting adjusted to recovering. He feels great since starting the program and looks forward to more sessions.       Expected Outcomes Short: Continue routine attendance with rehab Long: Continue to maintain positive attitude       Interventions Encouraged to attend Cardiac Rehabilitation for the exercise       Continue Psychosocial Services  Follow up required by staff                Psychosocial Discharge (Final Psychosocial Re-Evaluation):  Psychosocial Re-Evaluation - 09/21/21 1403       Psychosocial Re-Evaluation   Current issues with Current Stress Concerns    Comments Gaetano is doing well mentally overall. His son is getting surgery as he has a cyst on throat in a couple months, but states he is not too worried. His wife is also getting testing for multiple health conditions. However, he feels he manages hia stress fairly good. He does not take any medications for depression/anxiety. He is trying to get back into [employer] with his wife again for exercise. He felt he had such a setback with his heart event and is still getting adjusted to recovering. He feels great since starting the program and looks forward to more sessions.    Expected Outcomes Short: Continue routine attendance with rehab Long: Continue to maintain  positive attitude    Interventions Encouraged to attend Cardiac Rehabilitation for the exercise    Continue Psychosocial Services  Follow up required by staff             Vocational Rehabilitation: Provide vocational rehab assistance to qualifying candidates.   Vocational Rehab Evaluation & Intervention:   Education: Education Goals: Education classes will be provided on a variety of topics geared toward better understanding of heart health and risk factor modification. Participant will state understanding/return demonstration of topics presented as noted by education test scores.  Learning Barriers/Preferences:   General Cardiac Education Topics:  AED/CPR: - Group verbal and written instruction with the use of models to demonstrate the basic use of the AED with the basic ABC's of resuscitation.   Anatomy and Cardiac Procedures: - Group verbal and visual presentation and models provide information about basic cardiac anatomy and function. Reviews the testing methods  done to diagnose heart disease and the outcomes of the test results. Describes the treatment choices: Medical Management, Angioplasty, or Coronary Bypass Surgery for treating various heart conditions including Myocardial Infarction, Angina, Valve Disease, and Cardiac Arrhythmias.  Written material given at graduation. Flowsheet Row Pulmonary Rehab from 03/19/2020 in [facility] Cardiac and Pulmonary Rehab  Date 02/20/20  Educator [facility]  Instruction Review Code 1- Verbalizes Understanding       Medication Safety: - Group verbal and visual instruction to review commonly prescribed medications for heart and lung disease. Reviews the medication, class of the drug, and side effects. Includes the steps to properly store meds and maintain the prescription regimen.  Written material given at graduation. Flowsheet Row Cardiac Rehab from 09/30/2021 in [facility] Cardiac and Pulmonary Rehab  Date 09/30/21  Educator [place]  Instruction Review  Code 1- Verbalizes Understanding       Intimacy: - Group verbal instruction through game format to discuss how heart and lung disease can affect sexual intimacy. Written material given at graduation.. Flowsheet Row Cardiac Rehab from 09/30/2021 in [facility] Cardiac and Pulmonary Rehab  Date 09/02/21  Educator [place]  Instruction Review Code 1- Verbalizes Understanding       Know Your Numbers and Heart Failure: - Group verbal and visual instruction to discuss disease risk factors for cardiac and pulmonary disease and treatment options.  Reviews associated critical values for Overweight/Obesity, Hypertension, Cholesterol, and Diabetes.  Discusses basics of heart failure: signs/symptoms and treatments.  Introduces Heart Failure Zone chart for action plan for heart failure.  Written material given at graduation.   Infection Prevention: - Provides verbal and written material to individual with discussion of infection control including proper hand washing and proper equipment cleaning during exercise session. Flowsheet Row Cardiac Rehab from 09/30/2021 in [facility] Cardiac and Pulmonary Rehab  Education need identified 08/25/21  Date 08/25/21  Educator [place]  Instruction Review Code 1- Verbalizes Understanding       Falls Prevention: - Provides verbal and written material to individual with discussion of falls prevention and safety. Flowsheet Row Cardiac Rehab from 09/30/2021 in [facility] Cardiac and Pulmonary Rehab  Education need identified 08/25/21  Date 08/25/21  Educator [place]  Instruction Review Code 1- Verbalizes Understanding       Other: -Provides group and verbal instruction on various topics (see comments)   Knowledge Questionnaire Score:  Knowledge Questionnaire Score - 09/07/21 1334       Knowledge Questionnaire Score   Pre Score cardiac: 20/26: 7heart, 9angina, 10sex, 11diabetes, 17nutrition, 19exercise             Core Components/Risk Factors/Patient Goals at Admission:  Personal  Goals and Risk Factors at Admission - 08/25/21 1427       Core Components/Risk Factors/Patient Goals on Admission    Weight Management Yes;Weight Maintenance    Intervention Weight Management: Develop a combined nutrition and exercise program designed to reach desired caloric intake, while maintaining appropriate intake of nutrient and fiber, sodium and fats, and appropriate energy expenditure required for the weight goal.;Weight Management: Provide education and appropriate resources to help participant work on and attain dietary goals.;Weight Management/Obesity: Establish reasonable short term and long term weight goals.    Admit Weight 155 lb (70.3 kg)    Goal Weight: Short Term 155 lb (70.3 kg)    Goal Weight: Long Term 155 lb (70.3 kg)    Expected Outcomes Short Term: Continue to assess and modify interventions until short term weight is achieved;Weight Maintenance: Understanding of the daily nutrition  guidelines, which includes 25-35% calories from fat, 7% or less cal from saturated fats, less than 266m cholesterol, less than 1.5gm of sodium, & 5 or more servings of fruits and vegetables daily;Understanding recommendations for meals to include 15-35% energy as protein, 25-35% energy from fat, 35-60% energy from carbohydrates, less than 2036mof dietary cholesterol, 20-35 gm of total fiber daily;Understanding of distribution of calorie intake throughout the day with the consumption of 4-5 meals/snacks    Hypertension Yes    Intervention Provide education on lifestyle modifcations including regular physical activity/exercise, weight management, moderate sodium restriction and increased consumption of fresh fruit, vegetables, and low fat dairy, alcohol moderation, and smoking cessation.;Monitor prescription use compliance.    Expected Outcomes Short Term: Continued assessment and intervention until BP is < 140/9027mG in hypertensive participants. < 130/40m48m in hypertensive participants with  diabetes, heart failure or chronic kidney disease.;Long Term: Maintenance of blood pressure at goal levels.    Lipids Yes    Intervention Provide education and support for participant on nutrition & aerobic/resistive exercise along with prescribed medications to achieve LDL <70mg48mL >40mg.68mExpected Outcomes Short Term: Participant states understanding of desired cholesterol values and is compliant with medications prescribed. Participant is following exercise prescription and nutrition guidelines.;Long Term: Cholesterol controlled with medications as prescribed, with individualized exercise RX and with personalized nutrition plan. Value goals: LDL < 70mg, 40m> 40 mg.             Education:Diabetes - Individual verbal and written instruction to review signs/symptoms of diabetes, desired ranges of glucose level fasting, after meals and with exercise. Acknowledge that pre and post exercise glucose checks will be done for 3 sessions at entry of program.   Core Components/Risk Factors/Patient Goals Review:   Goals and Risk Factor Review     Row Name 09/21/21 1401             Core Components/Risk Factors/Patient Goals Review   Personal Goals Review Weight Management/Obesity;Hypertension       Review Normal is doing well. He is maintaining his  weight around 150 lb. He is trying to maintain, if not gain, as he used to be 180 lb. We talked about making sure he is maintain appropriate muscle mass and to go off by the feeling of his  clothing. BP is checked at home and he typically runs around  120/60s. He checks it almost with the help of his wife. He is aware to check if ever feels symptomatic. He is staying compliant with his medications.       Expected Outcomes Short: Continue to monitor weight Long: Continue to manage lifestyle risk factors                Core Components/Risk Factors/Patient Goals at Discharge (Final Review):   Goals and Risk Factor Review - 09/21/21 1401        Core Components/Risk Factors/Patient Goals Review   Personal Goals Review Weight Management/Obesity;Hypertension    Review Normal is doing well. He is maintaining his  weight around 150 lb. He is trying to maintain, if not gain, as he used to be 180 lb. We talked about making sure he is maintain appropriate muscle mass and to go off by the feeling of his  clothing. BP is checked at home and he typically runs around  120/60s. He checks it almost with the help of his wife. He is aware to check if ever feels symptomatic. He is staying compliant with  his medications.    Expected Outcomes Short: Continue to monitor weight Long: Continue to manage lifestyle risk factors             ITP Comments:  ITP Comments     Row Name 08/17/21 1535 08/25/21 0937 09/02/21 1332 09/09/21 0732 10/05/21 1200   ITP Comments Virtual orientation call completed today. he has an appointment on Date: 08/25/2021  for EP eval and gym Orientation.  Documentation of diagnosis can be found in [facility] Date: 02/20/2021 . Completed 6MWT and gym orientation. Initial ITP created and sent for review to Dr. Emily Filbert, Medical Director. First full day of exercise!  Patient was oriented to gym and equipment including functions, settings, policies, and procedures.  Patient's individual exercise prescription and treatment plan were reviewed.  All starting workloads were established based on the results of the 6 minute walk test done at initial orientation visit.  The plan for exercise progression was also introduced and progression will be customized based on patient's performance and goals. 30 Day review completed. Medical Director ITP review done, changes made as directed, and signed approval by Medical Director.   New Completed initial RD consultation    [place] Name 10/07/21 [phone]           ITP Comments 30 Day review completed. Medical Director ITP review done, changes made as directed, and signed approval by Medical Director.                 Comments:

## 2021-10-07 NOTE — Progress Notes (Signed)
Daily Session Note  Patient Details  Name: Phillip Chavez MRN: [id number] Date of Birth: 05-19-1940 Referring Provider:   Flowsheet Row Cardiac Rehab from 08/25/2021 in [facility] Cardiac and Pulmonary Rehab  Referring Provider Loma Boston MD       Encounter Date: 10/07/2021  Check In:  Session Check In - 10/07/21 1339       Check-In   Supervising physician immediately available to respond to emergencies See telemetry face sheet for immediately available ER MD    Location [facility]-Cardiac & Pulmonary Rehab    Staff Present Heath Lark, RN, BSN, Jacklynn Bue, MS, ASCM CEP, Exercise Physiologist;Laureen Owens Shark, BS, RRT, CPFT    Virtual Visit No    Medication changes reported     No    Fall or balance concerns reported    No    Warm-up and Cool-down Performed on first and last piece of equipment    Resistance Training Performed Yes    VAD Patient? No    PAD/SET Patient? No      Pain Assessment   Currently in Pain? No/denies                Social History   Tobacco Use  Smoking Status Former   Packs/day: 1.00   Years: 20.00   Total pack years: 20.00   Types: Cigarettes   Quit date: 03/21/1978   Years since quitting: 43.5  Smokeless Tobacco Never    Goals Met:  Independence with exercise equipment Exercise tolerated well No report of concerns or symptoms today  Goals Unmet:  Not Applicable  Comments: Pt able to follow exercise prescription today without complaint.  Will continue to monitor for progression.    Dr. Emily Filbert is Medical Director for [place].  Dr. Ottie Glazier is Medical Director for [facility] Pulmonary Rehabilitation.

## 2021-10-12 ENCOUNTER — Encounter: Payer: No Typology Code available for payment source | Admitting: *Deleted

## 2021-10-12 DIAGNOSIS — J449 Chronic obstructive pulmonary disease, unspecified: Secondary | ICD-10-CM | POA: Diagnosis not present

## 2021-10-12 DIAGNOSIS — Z951 Presence of aortocoronary bypass graft: Secondary | ICD-10-CM

## 2021-10-12 DIAGNOSIS — Z952 Presence of prosthetic heart valve: Secondary | ICD-10-CM

## 2021-10-12 NOTE — Progress Notes (Signed)
Daily Session Note  Patient Details  Name: Phillip Chavez MRN: [id number] Date of Birth: 07/30/1940 Referring Provider:   Flowsheet Row Cardiac Rehab from 08/25/2021 in [facility] Cardiac and Pulmonary Rehab  Referring Provider Albergo, Gail MD       Encounter Date: 10/12/2021  Check In:  Session Check In - 10/12/21 1332       Check-In   Supervising physician immediately available to respond to emergencies See telemetry face sheet for immediately available ER MD    Location [facility]-Cardiac & Pulmonary Rehab    Staff Present Meredith Craven, RN BSN;Melissa Caiola, RDN, LDN;Kara Langdon, MS, ASCM CEP, Exercise Physiologist    Virtual Visit No    Medication changes reported     Yes    Comments started Lasix    Fall or balance concerns reported    No    Warm-up and Cool-down Performed on first and last piece of equipment    Resistance Training Performed Yes    VAD Patient? No    PAD/SET Patient? No      Pain Assessment   Currently in Pain? No/denies                Social History   Tobacco Use  Smoking Status Former   Packs/day: 1.00   Years: 20.00   Total pack years: 20.00   Types: Cigarettes   Quit date: 03/21/1978   Years since quitting: 43.5  Smokeless Tobacco Never    Goals Met:  Independence with exercise equipment Exercise tolerated well No report of concerns or symptoms today Strength training completed today  Goals Unmet:  Not Applicable  Comments: Pt able to follow exercise prescription today without complaint.  Will continue to monitor for progression.    Dr. Mark Miller is Medical Director for [facility].  Dr. Fuad Aleskerov is Medical Director for [facility] Pulmonary Rehabilitation. 

## 2021-10-14 ENCOUNTER — Encounter: Payer: No Typology Code available for payment source | Admitting: *Deleted

## 2021-10-14 DIAGNOSIS — Z952 Presence of prosthetic heart valve: Secondary | ICD-10-CM

## 2021-10-14 DIAGNOSIS — J449 Chronic obstructive pulmonary disease, unspecified: Secondary | ICD-10-CM | POA: Diagnosis not present

## 2021-10-14 DIAGNOSIS — Z951 Presence of aortocoronary bypass graft: Secondary | ICD-10-CM

## 2021-10-14 NOTE — Progress Notes (Signed)
Daily Session Note  Patient Details  Name: LONELL STAMOS MRN: [id number] Date of Birth: [birth year] Referring Provider:   Flowsheet Row Cardiac Rehab from 08/25/2021 in [facility] Cardiac and Pulmonary Rehab  Referring Provider Loma Boston MD       Encounter Date: 10/14/2021  Check In:  Session Check In - 10/14/21 1330       Check-In   Supervising physician immediately available to respond to emergencies See telemetry face sheet for immediately available ER MD    Location [facility]-Cardiac & Pulmonary Rehab    Staff Present Renita Papa, RN BSN;Melissa Niles, RDN, Tawanna Solo, MS, ASCM CEP, Exercise Physiologist    Virtual Visit No    Medication changes reported     No    Fall or balance concerns reported    No    Warm-up and Cool-down Performed on first and last piece of equipment    Resistance Training Performed Yes    VAD Patient? No    PAD/SET Patient? No      Pain Assessment   Currently in Pain? No/denies                Social History   Tobacco Use  Smoking Status Former   Packs/day: 1.00   Years: 20.00   Total pack years: 20.00   Types: Cigarettes   Quit date: 03/21/1978   Years since quitting: 43.5  Smokeless Tobacco Never    Goals Met:  Independence with exercise equipment Exercise tolerated well No report of concerns or symptoms today Strength training completed today  Goals Unmet:  Not Applicable  Comments: Pt able to follow exercise prescription today without complaint.  Will continue to monitor for progression.    Dr. Emily Filbert is Medical Director for [place].  Dr. Ottie Glazier is Medical Director for [facility] Pulmonary Rehabilitation.

## 2021-10-19 ENCOUNTER — Encounter: Payer: No Typology Code available for payment source | Admitting: *Deleted

## 2021-10-19 DIAGNOSIS — Z952 Presence of prosthetic heart valve: Secondary | ICD-10-CM

## 2021-10-19 DIAGNOSIS — J449 Chronic obstructive pulmonary disease, unspecified: Secondary | ICD-10-CM | POA: Diagnosis not present

## 2021-10-19 DIAGNOSIS — Z951 Presence of aortocoronary bypass graft: Secondary | ICD-10-CM

## 2021-10-19 NOTE — Progress Notes (Signed)
Daily Session Note  Patient Details  Name: Phillip Chavez MRN: [id number] Date of Birth: [birth year] Referring Provider:   Flowsheet Row Cardiac Rehab from 08/25/2021 in [facility] Cardiac and Pulmonary Rehab  Referring Provider Loma Boston MD       Encounter Date: 10/19/2021  Check In:  Session Check In - 10/19/21 1327       Check-In   Supervising physician immediately available to respond to emergencies See telemetry face sheet for immediately available ER MD    Location [facility]-Cardiac & Pulmonary Rehab    Staff Present Justin Mend, RCP,RRT,BSRT;Juliocesar Blasius Sherryll Burger, RN BSN;Melissa Caiola, RDN, LDN    Virtual Visit No    Medication changes reported     No    Fall or balance concerns reported    No    Warm-up and Cool-down Performed on first and last piece of equipment    Resistance Training Performed Yes    VAD Patient? No    PAD/SET Patient? No      Pain Assessment   Currently in Pain? No/denies                Social History   Tobacco Use  Smoking Status Former   Packs/day: 1.00   Years: 20.00   Total pack years: 20.00   Types: Cigarettes   Quit date: 03/21/1978   Years since quitting: 43.6  Smokeless Tobacco Never    Goals Met:  Independence with exercise equipment Exercise tolerated well No report of concerns or symptoms today Strength training completed today  Goals Unmet:  Not Applicable  Comments: Pt able to follow exercise prescription today without complaint.  Will continue to monitor for progression.    Dr. Emily Filbert is Medical Director for [place].  Dr. Ottie Glazier is Medical Director for [facility] Pulmonary Rehabilitation.

## 2021-10-21 ENCOUNTER — Encounter: Payer: No Typology Code available for payment source | Admitting: *Deleted

## 2021-10-21 DIAGNOSIS — Z952 Presence of prosthetic heart valve: Secondary | ICD-10-CM

## 2021-10-21 DIAGNOSIS — Z951 Presence of aortocoronary bypass graft: Secondary | ICD-10-CM

## 2021-10-21 DIAGNOSIS — J449 Chronic obstructive pulmonary disease, unspecified: Secondary | ICD-10-CM | POA: Diagnosis not present

## 2021-10-21 NOTE — Progress Notes (Signed)
Daily Session Note  Patient Details  Name: Phillip Chavez MRN: [id number] Date of Birth: Feb 19, 1941 Referring Provider:   Flowsheet Row Cardiac Rehab from 08/25/2021 in [facility] Cardiac and Pulmonary Rehab  Referring Provider Loma Boston MD       Encounter Date: 10/21/2021  Check In:  Session Check In - 10/21/21 1330       Check-In   Supervising physician immediately available to respond to emergencies See telemetry face sheet for immediately available ER MD    Location [facility]-Cardiac & Pulmonary Rehab    Staff Present Renita Papa, RN BSN;Noah Tickle, BS, Exercise Physiologist;Melissa [place], RDN, LDN;Joseph [place], RCP,RRT,BSRT    Virtual Visit No    Medication changes reported     No    Fall or balance concerns reported    No    Warm-up and Cool-down Performed on first and last piece of equipment    Resistance Training Performed Yes    VAD Patient? No    PAD/SET Patient? No      Pain Assessment   Currently in Pain? No/denies                Social History   Tobacco Use  Smoking Status Former   Packs/day: 1.00   Years: 20.00   Total pack years: 20.00   Types: Cigarettes   Quit date: 03/21/1978   Years since quitting: 43.6  Smokeless Tobacco Never    Goals Met:  Independence with exercise equipment Exercise tolerated well No report of concerns or symptoms today Strength training completed today  Goals Unmet:  Not Applicable  Comments: Pt able to follow exercise prescription today without complaint.  Will continue to monitor for progression.    Dr. Emily Filbert is Medical Director for [place].  Dr. Ottie Glazier is Medical Director for [facility] Pulmonary Rehabilitation.

## 2021-10-28 ENCOUNTER — Encounter: Payer: No Typology Code available for payment source | Admitting: *Deleted

## 2021-10-28 DIAGNOSIS — Z951 Presence of aortocoronary bypass graft: Secondary | ICD-10-CM

## 2021-10-28 DIAGNOSIS — J449 Chronic obstructive pulmonary disease, unspecified: Secondary | ICD-10-CM | POA: Diagnosis not present

## 2021-10-28 DIAGNOSIS — Z952 Presence of prosthetic heart valve: Secondary | ICD-10-CM

## 2021-10-28 NOTE — Progress Notes (Signed)
Daily Session Note  Patient Details  Name: DENIM KALMBACH MRN: [id number] Date of Birth: April 21, 1940 Referring Provider:   Flowsheet Row Cardiac Rehab from 08/25/2021 in [facility] Cardiac and Pulmonary Rehab  Referring Provider Loma Boston MD       Encounter Date: 10/28/2021  Check In:  Session Check In - 10/28/21 1326       Check-In   Supervising physician immediately available to respond to emergencies See telemetry face sheet for immediately available ER MD    Location [facility]Cardiac & Pulmonary Rehab    Staff Present Renita Papa, RN BSN;Jessica [place], MA, RCEP, CCRP, Marylynn Pearson, MS, ASCM CEP, Exercise Physiologist    Virtual Visit No    Medication changes reported     No    Fall or balance concerns reported    No    Warm-up and Cool-down Performed on first and last piece of equipment    Resistance Training Performed Yes    VAD Patient? No    PAD/SET Patient? No      Pain Assessment   Currently in Pain? No/denies                Social History   Tobacco Use  Smoking Status Former   Packs/day: 1.00   Years: 20.00   Total pack years: 20.00   Types: Cigarettes   Quit date: 03/21/1978   Years since quitting: 43.6  Smokeless Tobacco Never    Goals Met:  Independence with exercise equipment Exercise tolerated well No report of concerns or symptoms today Strength training completed today  Goals Unmet:  Not Applicable  Comments: Pt able to follow exercise prescription today without complaint.  Will continue to monitor for progression.    Dr. Emily Filbert is Medical Director for [place].  Dr. Ottie Glazier is Medical Director for [facility] Pulmonary Rehabilitation.

## 2021-10-29 ENCOUNTER — Encounter: Payer: No Typology Code available for payment source | Admitting: *Deleted

## 2021-10-29 DIAGNOSIS — Z951 Presence of aortocoronary bypass graft: Secondary | ICD-10-CM

## 2021-10-29 DIAGNOSIS — J449 Chronic obstructive pulmonary disease, unspecified: Secondary | ICD-10-CM | POA: Diagnosis not present

## 2021-10-29 NOTE — Progress Notes (Signed)
Daily Session Note  Patient Details  Name: Phillip Chavez MRN: [id number] Date of Birth: 06/28/1940 Referring Provider:   Flowsheet Row Cardiac Rehab from 08/25/2021 in [facility] Cardiac and Pulmonary Rehab  Referring Provider Loma Boston MD       Encounter Date: 10/29/2021  Check In:  Session Check In - 10/29/21 1342       Check-In   Supervising physician immediately available to respond to emergencies See telemetry face sheet for immediately available ER MD    Location [facility]-Cardiac & Pulmonary Rehab    Staff Present Renita Papa, RN BSN;Joseph [place], RCP,RRT,BSRT;Jessica [place], [place], RCEP, CCRP, CCET    Virtual Visit No    Medication changes reported     No    Fall or balance concerns reported    No    Warm-up and Cool-down Performed on first and last piece of equipment    Resistance Training Performed Yes    VAD Patient? No    PAD/SET Patient? No      Pain Assessment   Currently in Pain? No/denies                Social History   Tobacco Use  Smoking Status Former   Packs/day: 1.00   Years: 20.00   Total pack years: 20.00   Types: Cigarettes   Quit date: 03/21/1978   Years since quitting: 43.6  Smokeless Tobacco Never    Goals Met:  Independence with exercise equipment Exercise tolerated well No report of concerns or symptoms today Strength training completed today  Goals Unmet:  Not Applicable  Comments: Pt able to follow exercise prescription today without complaint.  Will continue to monitor for progression.    Dr. Emily Filbert is Medical Director for [place].  Dr. Ottie Glazier is Medical Director for [facility] Pulmonary Rehabilitation.

## 2021-11-04 ENCOUNTER — Encounter: Payer: No Typology Code available for payment source | Attending: Physician Assistant | Admitting: *Deleted

## 2021-11-04 DIAGNOSIS — Z952 Presence of prosthetic heart valve: Secondary | ICD-10-CM | POA: Insufficient documentation

## 2021-11-04 DIAGNOSIS — R0600 Dyspnea, unspecified: Secondary | ICD-10-CM | POA: Diagnosis not present

## 2021-11-04 DIAGNOSIS — Z48812 Encounter for surgical aftercare following surgery on the circulatory system: Secondary | ICD-10-CM | POA: Diagnosis not present

## 2021-11-04 DIAGNOSIS — Z951 Presence of aortocoronary bypass graft: Secondary | ICD-10-CM | POA: Insufficient documentation

## 2021-11-04 NOTE — Progress Notes (Signed)
Daily Session Note  Patient Details  Name: TARRANCE JANUSZEWSKI MRN: [id number] Date of Birth: [birth year] Referring Provider:   Flowsheet Row Cardiac Rehab from 08/25/2021 in [facility] Cardiac and Pulmonary Rehab  Referring Provider Loma Boston MD       Encounter Date: 11/04/2021  Check In:  Session Check In - 11/04/21 1348       Check-In   Supervising physician immediately available to respond to emergencies See telemetry face sheet for immediately available ER MD    Location [facility]-Cardiac & Pulmonary Rehab    Staff Present Renita Papa, RN BSN;Joseph [place], RCP,RRT,BSRT;Laureen [place], [place], RRT, CPFT    Virtual Visit No    Medication changes reported     No    Fall or balance concerns reported    No    Warm-up and Cool-down Performed on first and last piece of equipment    Resistance Training Performed Yes    VAD Patient? No    PAD/SET Patient? No      Pain Assessment   Currently in Pain? No/denies                Social History   Tobacco Use  Smoking Status Former   Packs/day: 1.00   Years: 20.00   Total pack years: 20.00   Types: Cigarettes   Quit date: 03/21/1978   Years since quitting: 43.6  Smokeless Tobacco Never    Goals Met:  Independence with exercise equipment Exercise tolerated well No report of concerns or symptoms today Strength training completed today  Goals Unmet:  Not Applicable  Comments: Pt able to follow exercise prescription today without complaint.  Will continue to monitor for progression.    Dr. Emily Filbert is Medical Director for [place].  Dr. Ottie Glazier is Medical Director for [facility] Pulmonary Rehabilitation.

## 2021-11-09 ENCOUNTER — Encounter: Payer: No Typology Code available for payment source | Admitting: *Deleted

## 2021-11-09 DIAGNOSIS — Z951 Presence of aortocoronary bypass graft: Secondary | ICD-10-CM

## 2021-11-09 DIAGNOSIS — Z48812 Encounter for surgical aftercare following surgery on the circulatory system: Secondary | ICD-10-CM | POA: Diagnosis not present

## 2021-11-09 DIAGNOSIS — Z952 Presence of prosthetic heart valve: Secondary | ICD-10-CM

## 2021-11-09 NOTE — Progress Notes (Signed)
Daily Session Note  Patient Details  Name: Phillip Chavez MRN: [id number] Date of Birth: 1940-03-28 Referring Provider:   Flowsheet Row Cardiac Rehab from 08/25/2021 in [facility] Cardiac and Pulmonary Rehab  Referring Provider Loma Boston MD       Encounter Date: 11/09/2021  Check In:  Session Check In - 11/09/21 1331       Check-In   Supervising physician immediately available to respond to emergencies See telemetry face sheet for immediately available ER MD    Location [facility]-Cardiac & Pulmonary Rehab    Staff Present Renita Papa, RN BSN;Joseph [place], RCP,RRT,BSRT;Kara [place], [place], ASCM CEP, Exercise Physiologist    Virtual Visit No    Medication changes reported     No    Fall or balance concerns reported    No    Warm-up and Cool-down Performed on first and last piece of equipment    Resistance Training Performed Yes    VAD Patient? No    PAD/SET Patient? No      Pain Assessment   Currently in Pain? No/denies                Social History   Tobacco Use  Smoking Status Former   Packs/day: 1.00   Years: 20.00   Total pack years: 20.00   Types: Cigarettes   Quit date: 03/21/1978   Years since quitting: 43.6  Smokeless Tobacco Never    Goals Met:  Independence with exercise equipment Exercise tolerated well No report of concerns or symptoms today Strength training completed today  Goals Unmet:  Not Applicable  Comments: Pt able to follow exercise prescription today without complaint.  Will continue to monitor for progression.    Dr. Emily Filbert is Medical Director for [place].  Dr. Ottie Glazier is Medical Director for [facility] Pulmonary Rehabilitation.

## 2021-11-12 ENCOUNTER — Encounter: Payer: No Typology Code available for payment source | Admitting: *Deleted

## 2021-11-12 DIAGNOSIS — Z951 Presence of aortocoronary bypass graft: Secondary | ICD-10-CM

## 2021-11-12 DIAGNOSIS — Z952 Presence of prosthetic heart valve: Secondary | ICD-10-CM

## 2021-11-12 DIAGNOSIS — Z48812 Encounter for surgical aftercare following surgery on the circulatory system: Secondary | ICD-10-CM | POA: Diagnosis not present

## 2021-11-12 NOTE — Progress Notes (Signed)
Daily Session Note  Patient Details  Name: Phillip Chavez MRN: [id number] Date of Birth: 04/20/1940 Referring Provider:   Flowsheet Row Cardiac Rehab from 08/25/2021 in [facility] Cardiac and Pulmonary Rehab  Referring Provider Loma Boston MD       Encounter Date: 11/12/2021  Check In:  Session Check In - 11/12/21 1343       Check-In   Supervising physician immediately available to respond to emergencies See telemetry face sheet for immediately available ER MD    Location [facility]-Cardiac & Pulmonary Rehab    Staff Present Renita Papa, RN BSN;Joseph Tessie Fass, RCP,RRT,BSRT;Noah [place], [place], Exercise Physiologist    Virtual Visit No    Medication changes reported     No    Fall or balance concerns reported    No    Warm-up and Cool-down Performed on first and last piece of equipment    Resistance Training Performed Yes    VAD Patient? No    PAD/SET Patient? No      Pain Assessment   Currently in Pain? No/denies                Social History   Tobacco Use  Smoking Status Former   Packs/day: 1.00   Years: 20.00   Total pack years: 20.00   Types: Cigarettes   Quit date: 03/21/1978   Years since quitting: 43.6  Smokeless Tobacco Never    Goals Met:  Independence with exercise equipment Exercise tolerated well No report of concerns or symptoms today Strength training completed today  Goals Unmet:  Not Applicable  Comments: Pt able to follow exercise prescription today without complaint.  Will continue to monitor for progression.    Dr. Emily Filbert is Medical Director for [place].  Dr. Ottie Glazier is Medical Director for [facility] Pulmonary Rehabilitation.

## 2021-11-16 ENCOUNTER — Encounter: Payer: No Typology Code available for payment source | Admitting: *Deleted

## 2021-11-16 DIAGNOSIS — Z951 Presence of aortocoronary bypass graft: Secondary | ICD-10-CM

## 2021-11-16 DIAGNOSIS — Z952 Presence of prosthetic heart valve: Secondary | ICD-10-CM

## 2021-11-16 DIAGNOSIS — Z48812 Encounter for surgical aftercare following surgery on the circulatory system: Secondary | ICD-10-CM | POA: Diagnosis not present

## 2021-11-16 NOTE — Progress Notes (Signed)
Daily Session Note  Patient Details  Name: Phillip Chavez MRN: [id number] Date of Birth: 1940/03/10 Referring Provider:   Flowsheet Row Cardiac Rehab from 08/25/2021 in [facility] Cardiac and Pulmonary Rehab  Referring Provider Loma Boston MD       Encounter Date: 11/16/2021  Check In:  Session Check In - 11/16/21 1354       Check-In   Supervising physician immediately available to respond to emergencies See telemetry face sheet for immediately available ER MD    Location [facility]-Cardiac & Pulmonary Rehab    Staff Present Renita Papa, RN Moises Blood, BS, ACSM CEP, Exercise Physiologist;Joseph Tessie Fass, [place]    Virtual Visit No    Medication changes reported     No    Fall or balance concerns reported    No    Warm-up and Cool-down Performed on first and last piece of equipment    Resistance Training Performed Yes    VAD Patient? No    PAD/SET Patient? No      Pain Assessment   Currently in Pain? No/denies                Social History   Tobacco Use  Smoking Status Former   Packs/day: 1.00   Years: 20.00   Total pack years: 20.00   Types: Cigarettes   Quit date: 03/21/1978   Years since quitting: 43.6  Smokeless Tobacco Never    Goals Met:  Independence with exercise equipment Exercise tolerated well No report of concerns or symptoms today Strength training completed today  Goals Unmet:  Not Applicable  Comments: Pt able to follow exercise prescription today without complaint.  Will continue to monitor for progression.    Dr. Emily Filbert is Medical Director for [place].  Dr. Ottie Glazier is Medical Director for [facility] Pulmonary Rehabilitation.

## 2021-11-18 ENCOUNTER — Encounter: Payer: No Typology Code available for payment source | Admitting: *Deleted

## 2021-11-18 DIAGNOSIS — Z48812 Encounter for surgical aftercare following surgery on the circulatory system: Secondary | ICD-10-CM | POA: Diagnosis not present

## 2021-11-18 DIAGNOSIS — Z951 Presence of aortocoronary bypass graft: Secondary | ICD-10-CM

## 2021-11-18 DIAGNOSIS — Z952 Presence of prosthetic heart valve: Secondary | ICD-10-CM

## 2021-11-18 NOTE — Progress Notes (Signed)
Daily Session Note  Patient Details  Name: Phillip Chavez MRN: [id number] Date of Birth: 1940-07-31 Referring Provider:   Flowsheet Row Cardiac Rehab from 08/25/2021 in [facility] Cardiac and Pulmonary Rehab  Referring Provider Loma Boston MD       Encounter Date: 11/18/2021  Check In:  Session Check In - 11/18/21 1352       Check-In   Supervising physician immediately available to respond to emergencies See telemetry face sheet for immediately available ER MD    Location [facility]-Cardiac & Pulmonary Rehab    Staff Present Renita Papa, RN BSN;Joseph [place], RCP,RRT,BSRT;Laureen [place], [place], RRT, CPFT    Virtual Visit No    Medication changes reported     No    Fall or balance concerns reported    No    Warm-up and Cool-down Performed on first and last piece of equipment    Resistance Training Performed Yes    VAD Patient? No    PAD/SET Patient? No      Pain Assessment   Currently in Pain? No/denies                Social History   Tobacco Use  Smoking Status Former   Packs/day: 1.00   Years: 20.00   Total pack years: 20.00   Types: Cigarettes   Quit date: 03/21/1978   Years since quitting: 43.6  Smokeless Tobacco Never    Goals Met:  Independence with exercise equipment Exercise tolerated well No report of concerns or symptoms today Strength training completed today  Goals Unmet:  Not Applicable  Comments: Pt able to follow exercise prescription today without complaint.  Will continue to monitor for progression.    Dr. Emily Filbert is Medical Director for [place].  Dr. Ottie Glazier is Medical Director for [facility] Pulmonary Rehabilitation.

## 2021-11-23 ENCOUNTER — Encounter: Payer: No Typology Code available for payment source | Admitting: *Deleted

## 2021-11-23 DIAGNOSIS — Z48812 Encounter for surgical aftercare following surgery on the circulatory system: Secondary | ICD-10-CM | POA: Diagnosis not present

## 2021-11-23 DIAGNOSIS — Z952 Presence of prosthetic heart valve: Secondary | ICD-10-CM

## 2021-11-23 DIAGNOSIS — Z951 Presence of aortocoronary bypass graft: Secondary | ICD-10-CM

## 2021-11-23 NOTE — Progress Notes (Signed)
Daily Session Note  Patient Details  Name: Phillip Chavez MRN: [id number] Date of Birth: 01/31/1941 Referring Provider:   Flowsheet Row Cardiac Rehab from 08/25/2021 in [facility] Cardiac and Pulmonary Rehab  Referring Provider Loma Boston MD       Encounter Date: 11/23/2021  Check In:  Session Check In - 11/23/21 1344       Check-In   Supervising physician immediately available to respond to emergencies See telemetry face sheet for immediately available ER MD    Location [facility]-Cardiac & Pulmonary Rehab    Staff Present Renita Papa, RN BSN;Joseph [place], RCP,RRT,BSRT;Kara [place], [place], ASCM CEP, Exercise Physiologist    Virtual Visit No    Medication changes reported     No    Fall or balance concerns reported    No    Warm-up and Cool-down Performed on first and last piece of equipment    Resistance Training Performed Yes    VAD Patient? No    PAD/SET Patient? No      Pain Assessment   Currently in Pain? No/denies                Social History   Tobacco Use  Smoking Status Former   Packs/day: 1.00   Years: 20.00   Total pack years: 20.00   Types: Cigarettes   Quit date: 03/21/1978   Years since quitting: 43.7  Smokeless Tobacco Never    Goals Met:  Independence with exercise equipment Exercise tolerated well No report of concerns or symptoms today Strength training completed today  Goals Unmet:  Not Applicable  Comments: Pt able to follow exercise prescription today without complaint.  Will continue to monitor for progression.    Dr. Emily Filbert is Medical Director for [place].  Dr. Ottie Glazier is Medical Director for [facility] Pulmonary Rehabilitation.

## 2021-11-25 ENCOUNTER — Encounter: Payer: No Typology Code available for payment source | Admitting: *Deleted

## 2021-11-25 VITALS — Ht 63.5 in | Wt 163.8 lb

## 2021-11-25 DIAGNOSIS — Z48812 Encounter for surgical aftercare following surgery on the circulatory system: Secondary | ICD-10-CM | POA: Diagnosis not present

## 2021-11-25 DIAGNOSIS — Z951 Presence of aortocoronary bypass graft: Secondary | ICD-10-CM

## 2021-11-25 DIAGNOSIS — Z952 Presence of prosthetic heart valve: Secondary | ICD-10-CM

## 2021-11-25 NOTE — Progress Notes (Addendum)
Daily Session Note  Patient Details  Name: Phillip Chavez MRN: [id number] Date of Birth: Jan 10, 1941 Referring Provider:   Flowsheet Row Cardiac Rehab from 08/25/2021 in [facility] Cardiac and Pulmonary Rehab  Referring Provider Loma Boston MD       Encounter Date: 11/25/2021  Check In:  Session Check In - 11/25/21 1413       Check-In   Supervising physician immediately available to respond to emergencies See telemetry face sheet for immediately available ER MD    Location [facility]-Cardiac & Pulmonary Rehab    Staff Present Coralie Keens, MS, ASCM CEP, Exercise Physiologist;Joseph [place] Poles, RN, ADN    Virtual Visit No    Medication changes reported     No    Fall or balance concerns reported    No    Warm-up and Cool-down Performed on first and last piece of equipment    Resistance Training Performed Yes    VAD Patient? No    PAD/SET Patient? No      Pain Assessment   Currently in Pain? No/denies                Social History   Tobacco Use  Smoking Status Former   Packs/day: 1.00   Years: 20.00   Total pack years: 20.00   Types: Cigarettes   Quit date: 03/21/1978   Years since quitting: 43.7  Smokeless Tobacco Never    Goals Met:  Independence with exercise equipment Exercise tolerated well No report of concerns or symptoms today Strength training completed today  Goals Unmet:  Not Applicable  Comments: Pt able to follow exercise prescription today without complaint.  Will continue to monitor for progression.   6 Minute Walk     Row Name 08/25/21 1200 11/25/21 1511       6 Minute Walk   Phase Initial Discharge    Distance 800 feet 1140 feet    Distance % Change -- 42 %    Distance Feet Change -- 340 ft    Walk Time 6 minutes 6 minutes    # of Rest Breaks 0 0    MPH 1.51 2.15    METS 1.19 1.73    RPE 13 13    Perceived Dyspnea  0 0    VO2 Peak 4.19 6.05    Symptoms Yes (comment) Yes (comment)    Comments Fatigue, Bilateral  tightness in hips Right calf pain 7/10    Resting HR 72 bpm 57 bpm    Resting BP 108/58 118/60    Resting Oxygen Saturation  97 % 96 %    Exercise Oxygen Saturation  during 6 min walk 98 % 93 %    Max Ex. HR 93 bpm 84 bpm    Max Ex. BP 122/64 138/62    2 Minute Post BP 112/58 --              Dr. Emily Filbert is Medical Director for [place].  Dr. Ottie Glazier is Medical Director for [facility] Pulmonary Rehabilitation.

## 2021-11-25 NOTE — Patient Instructions (Signed)
Discharge Patient Instructions  Patient Details  Name: Phillip Chavez MRN: [id number] Date of Birth: 1940/06/24 Referring Provider:  [facility]   Number of Visits: 36  Reason for Discharge:  Patient reached a stable level of exercise. Patient independent in their exercise. Patient has met program and personal goals.  Smoking History:  Social History   Tobacco Use  Smoking Status Former   Packs/day: 1.00   Years: 20.00   Total pack years: 20.00   Types: Cigarettes   Quit date: 03/21/1978   Years since quitting: 43.7  Smokeless Tobacco Never    Diagnosis:  S/P CABG x 1  S/P AVR (aortic valve replacement)  Initial Exercise Prescription:  Initial Exercise Prescription - 08/25/21 1400       Date of Initial Exercise RX and Referring Provider   Date 08/25/21    Referring Provider Loma Boston MD      Oxygen   Maintain Oxygen Saturation 88% or higher      Treadmill   MPH 1.3    Grade 0    Minutes 15    METs 2      NuStep   Level 1    SPM 80    Minutes 15    METs 1.1      T5 Nustep   Level 1    SPM 80    Minutes 15    METs 1.1      Biostep-RELP   Level 1    SPM 50    Minutes 15    METs 1.1      Prescription Details   Frequency (times per week) 2    Duration Progress to 30 minutes of continuous aerobic without signs/symptoms of physical distress      Intensity   THRR 40-80% of Max Heartrate 98 - 124    Ratings of Perceived Exertion 11-13    Perceived Dyspnea 0-4      Progression   Progression Continue to progress workloads to maintain intensity without signs/symptoms of physical distress.      Resistance Training   Training Prescription Yes    Weight 3 lb    Reps 10-15             Discharge Exercise Prescription (Final Exercise Prescription Changes):  Exercise Prescription Changes - 11/17/21 1300       Response to Exercise   Blood Pressure (Admit) 124/60    Blood Pressure (Exit) 136/58    Heart Rate (Admit) 69 bpm     Heart Rate (Exercise) 92 bpm    Heart Rate (Exit) 75 bpm    Rating of Perceived Exertion (Exercise) 15    Symptoms none    Duration Continue with 30 min of aerobic exercise without signs/symptoms of physical distress.    Intensity THRR unchanged      Progression   Progression Continue to progress workloads to maintain intensity without signs/symptoms of physical distress.    Average METs 2.75      Resistance Training   Training Prescription Yes    Weight 4 lb    Reps 10-15      Interval Training   Interval Training No      NuStep   Level 3    Minutes 15    METs 2      Biostep-RELP   Level 2    Minutes 15    METs 3      Home Exercise Plan   Plans to continue exercise at [employer] (  comment)   [employer]   Frequency Add 2 additional days to program exercise sessions.    Initial Home Exercises Provided 09/21/21      Oxygen   Maintain Oxygen Saturation 88% or higher             Functional Capacity:  6 Minute Walk     Row Name 08/25/21 1200 11/25/21 1511       6 Minute Walk   Phase Initial Discharge    Distance 800 feet 1140 feet    Distance % Change -- 42 %    Distance Feet Change -- 340 ft    Walk Time 6 minutes 6 minutes    # of Rest Breaks 0 0    MPH 1.51 2.15    METS 1.19 1.73    RPE 13 13    Perceived Dyspnea  0 0    VO2 Peak 4.19 6.05    Symptoms Yes (comment) Yes (comment)    Comments Fatigue, Bilateral tightness in hips Right calf pain 7/10    Resting HR 72 bpm 57 bpm    Resting BP 108/58 118/60    Resting Oxygen Saturation  97 % 96 %    Exercise Oxygen Saturation  during 6 min walk 98 % 93 %    Max Ex. HR 93 bpm 84 bpm    Max Ex. BP 122/64 138/62    2 Minute Post BP 112/58 --              Nutrition & Weight - Outcomes:  Pre Biometrics - 08/25/21 1159       Pre Biometrics   Height 5' 3.5" (1.613 m)    Weight 155 lb 1.6 oz (70.4 kg)    BMI (Calculated) 27.04    Single Leg Stand 2.59 seconds             Post  Biometrics - 11/25/21 1511        Post  Biometrics   Height 5' 3.5" (1.613 m)    Weight 163 lb 12.8 oz (74.3 kg)    BMI (Calculated) 28.56             Nutrition:  Nutrition Therapy & Goals - 10/05/21 1107       Nutrition Therapy   Diet Heart healhty, low Na, Pulmonary MNT    Drug/Food Interactions Statins/Certain Fruits    Protein (specify units) 85g    Fiber 30 grams    Whole Grain Foods 3 servings    Saturated Fats 16 max. grams    Fruits and Vegetables 8 servings/day    Sodium 2 grams      Personal Nutrition Goals   Nutrition Goal ST: add protein to breakfast, practice MyPlate guidlines for meals LT: Maintain kidney function, create meals with MyPlate structure, meet protein needs.    Comments 81 y.o. M admitted to cardiac rehab s/p CABG x1. PMHx includes CKD stg 3, secondary hyperparathyroidism, anemia, COPD, GERD, HTN, HLD. Relevant medications includes vit C, vit D3, dulcolax, epoetin, dronabinol, ferrous sulfate, reglan, nepro, omega-3 , omeprazole, miralax, crestor. Linville was on dialyisis, but since his kidney function is improving he has stopped; he reports now his doctors will check in for a follow-up every 6 months. Discussed healthy eating for CKD stg 3. Juvon and his wife are trying to stay away from bread as they feel they eat too many sandwiches. They go out to eat <2-3x/week, will sometimes get fast food or fish or chicken. He reports using  liquid plant oil (canola) and sometimes butter and they limit salt and have tried to cut back. Food recall: Ladislao reports difficulty remembering what he has for lunch and dinner most days as it can vary. B: oatmeal with brown sugar or cheerios or corn fakes sometimes or sometimes gravy with toast L: fast food (whopper) or wife will cook S: will not always snack: sometimes salsa or chocolate pudding D: soup (canned - chicken noodle), wife will cook chicken or pork chop with vegetables. he reports issues swallowing rice as he feels  it slows down in his throat, but reports no other issues with anything else; suggested trying to pair rice with liquid such as sauce to add moisture - if the problem persists, consider limiting rice. Drinks: diet drinks, milk, or sweet tea. However, he will mostly have water; he does not have the nepro as often as he does not like it. Discussed heart healthy as well as suggested including protein with breakfast: peanut butter or eggs for example, encouraged vairety of fruits and vegetables, and provided examples for quick and easy meals as he reports they will cook less (microwavable grains, baked potatoes and sweet potatoes, frozen vegetables, canned chicken, or rotisserie chicken).      Intervention Plan   Intervention Prescribe, educate and counsel regarding individualized specific dietary modifications aiming towards targeted core components such as weight, hypertension, lipid management, diabetes, heart failure and other comorbidities.    Expected Outcomes Short Term Goal: Understand basic principles of dietary content, such as calories, fat, sodium, cholesterol and nutrients.;Short Term Goal: A plan has been developed with personal nutrition goals set during dietitian appointment.;Long Term Goal: Adherence to prescribed nutrition plan.              Goals reviewed with patient; copy given to patient.

## 2021-11-30 ENCOUNTER — Ambulatory Visit: Payer: No Typology Code available for payment source

## 2021-12-02 ENCOUNTER — Encounter: Payer: No Typology Code available for payment source | Attending: Physician Assistant | Admitting: *Deleted

## 2021-12-02 ENCOUNTER — Encounter: Payer: Self-pay | Admitting: *Deleted

## 2021-12-02 DIAGNOSIS — R0600 Dyspnea, unspecified: Secondary | ICD-10-CM | POA: Diagnosis not present

## 2021-12-02 DIAGNOSIS — Z951 Presence of aortocoronary bypass graft: Secondary | ICD-10-CM | POA: Diagnosis present

## 2021-12-02 DIAGNOSIS — Z952 Presence of prosthetic heart valve: Secondary | ICD-10-CM | POA: Diagnosis not present

## 2021-12-02 DIAGNOSIS — Z48812 Encounter for surgical aftercare following surgery on the circulatory system: Secondary | ICD-10-CM | POA: Insufficient documentation

## 2021-12-02 NOTE — Progress Notes (Signed)
Daily Session Note  Patient Details  Name: Phillip Chavez MRN: [id number] Date of Birth: 1940/06/20 Referring Provider:   Flowsheet Row Cardiac Rehab from 08/25/2021 in [facility]  Referring Provider Loma Boston MD       Encounter Date: 12/02/2021  Check In:  Session Check In - 12/02/21 1415       Check-In   Supervising physician immediately available to respond to emergencies See telemetry face sheet for immediately available ER MD    Location [facility] & Pul[facility]    Staff Present Alberteen Sam, MA, RCEP, CCRP, Mindi Curling, RN, ADN;Lailie Smead Sherryll Burger, RN BSN    Virtual Visit No    Medication changes reported     No    Fall or balance concerns reported    No    Warm-up and Cool-down Performed on first and last piece of equipment    Resistance Training Performed Yes    VAD Patient? No    PAD/SET Patient? No      Pain Assessment   Currently in Pain? No/denies                Social History   Tobacco Use  Smoking Status Former   Packs/day: 1.00   Years: 20.00   Total pack years: 20.00   Types: Cigarettes   Quit date: 03/21/1978   Years since quitting: 43.7  Smokeless Tobacco Never    Goals Met:  Independence with exercise equipment Exercise tolerated well No report of concerns or symptoms today Strength training completed today  Goals Unmet:  Not Applicable  Comments: Pt able to follow exercise prescription today without complaint.  Will continue to monitor for progression.    Dr. Emily Filbert is Medical Director for [facility].  Dr. Ottie Glazier is Medical Director for [facility].

## 2021-12-02 NOTE — Progress Notes (Signed)
Cardiac Individual Treatment Plan  Patient Details  Name: LEEMON AYALA MRN: [id number] Date of Birth: [birth year] Referring Provider:   Flowsheet Row Cardiac Rehab from 08/25/2021 in [facility] Cardiac and Pulmonary Rehab  Referring Provider Loma Boston MD       Initial Encounter Date:  Flowsheet Row Cardiac Rehab from 08/25/2021 in [facility] Cardiac and Pulmonary Rehab  Date 08/25/21       Visit Diagnosis: S/P CABG x 1  S/P AVR (aortic valve replacement)  Patient's Home Medications on Admission:  Current Outpatient Medications:    albuterol (VENTOLIN HFA) 108 (90 Base) MCG/ACT inhaler, Inhale 2 puffs into the lungs every 6 (six) hours as needed for wheezing or shortness of breath., Disp: , Rfl:    ascorbic acid (VITAMIN C) 500 MG tablet, Take by mouth., Disp: , Rfl:    bisacodyl (DULCOLAX) 10 MG suppository, Place 10 mg rectally as needed for moderate constipation. (Patient not taking: Reported on 05/26/2021), Disp: , Rfl:    calamine lotion, Apply 1 application topically as needed for itching. (Patient not taking: Reported on 05/26/2021), Disp: , Rfl:    cholecalciferol (VITAMIN D3) 25 MCG (1000 UNIT) tablet, Take 1,000 Units by mouth daily., Disp: , Rfl:    dronabinol (MARINOL) 5 MG capsule, Take 5 mg by mouth 2 (two) times daily before a meal. (Patient not taking: Reported on 08/17/2021), Disp: , Rfl:    epoetin alfa (EPOGEN) 10000 UNIT/ML injection, Inject 1 mL (10,000 Units total) into the vein every Monday, Wednesday, and Friday with hemodialysis. (Patient not taking: Reported on 05/26/2021), Disp: 1 mL, Rfl:    ferrous sulfate 325 (65 FE) MG tablet, Take 1 tablet (325 mg total) by mouth daily with breakfast., Disp: , Rfl: 3   fluticasone (FLONASE) 50 MCG/ACT nasal spray, INSTILL 1 SPRAY INTO EACH NOSTRIL EVERY DAY MAXIMUM 2 SPRAYS IN EACH NOSTRIL DAILY. FOR ALLERGIES, Disp: , Rfl:    fluticasone furoate-vilanterol (BREO ELLIPTA) 200-25 MCG/ACT AEPB, Inhale 1 puff into the lungs daily.,  Disp: , Rfl:    ketotifen (ZADITOR) 0.025 % ophthalmic solution, 1 drop 2 (two) times daily., Disp: , Rfl:    losartan (COZAAR) 25 MG tablet, TAKE ONE TABLET BY MOUTH TWO TIMES A DAY FOR BLOOD PRESSURE, Disp: , Rfl:    metoCLOPramide (REGLAN) 5 MG tablet, Take 5 mg by mouth 4 (four) times daily -  before meals and at bedtime. (Patient not taking: Reported on 05/26/2021), Disp: , Rfl:    metoprolol tartrate (LOPRESSOR) 25 MG tablet, TAKE ONE-HALF TABLET BY MOUTH TWO TIMES A DAY FOR HIGH BLOOD PRESSURE, Disp: , Rfl:    Nutritional Supplements (FEEDING SUPPLEMENT, NEPRO CARB STEADY,) LIQD, Take 237 mLs by mouth 3 (three) times daily between meals. (Patient not taking: Reported on 08/17/2021), Disp: , Rfl: 0   nystatin (MYCOSTATIN/NYSTOP) powder, Apply 1 application topically 2 (two) times daily. (Patient not taking: Reported on 05/26/2021), Disp: , Rfl:    omega-3 acid ethyl esters (LOVAZA) 1 g capsule, Take 3 g by mouth 2 (two) times daily. (Patient not taking: Reported on 08/17/2021), Disp: , Rfl:    Omega-3 Fatty Acids (FISH OIL) 1000 MG CAPS, TAKE 3 CAPSULES (SEA-OMEGA 30 FISH OIL) BY MOUTH TWO TIMES A DAY FOR CHOLESTEROL, Disp: , Rfl:    pantoprazole (PROTONIX) 40 MG tablet, 1 tablet daily. (Patient not taking: Reported on 05/26/2021), Disp: , Rfl:    polyethylene glycol (MIRALAX / GLYCOLAX) 17 g packet, Take 17 g by mouth 2 (two) times daily., Disp: ,  Rfl: 0   rosuvastatin (CRESTOR) 40 MG tablet, Take 40 mg by mouth daily., Disp: , Rfl:    tamsulosin (FLOMAX) 0.4 MG CAPS capsule, Take 1 capsule (0.4 mg total) by mouth daily. (Patient not taking: Reported on 05/26/2021), Disp: , Rfl:   Past Medical History: Past Medical History:  Diagnosis Date   Chronic CHF (congestive heart failure) (HCC)    COPD (chronic obstructive pulmonary disease) (HCC)    GERD (gastroesophageal reflux disease)    HLD (hyperlipidemia)    HTN (hypertension)    Presence of permanent cardiac pacemaker     Tobacco  Use: Social History   Tobacco Use  Smoking Status Former   Packs/day: 1.00   Years: 20.00   Total pack years: 20.00   Types: Cigarettes   Quit date: 03/21/1978   Years since quitting: 43.7  Smokeless Tobacco Never    Labs: Review Flowsheet       Latest Ref Rng & Units 01/04/2021 01/05/2021 01/11/2021  Labs for ITP Cardiac and Pulmonary Rehab  Cholestrol 0 - 200 mg/dL 123  - -  LDL (calc) 0 - 99 mg/dL 86  - -  HDL-C >40 mg/dL 13  - -  Trlycerides <150 mg/dL 120  - -  Hemoglobin A1c 4.8 - 5.6 % - 6.1  -  PH, Arterial 7.350 - 7.450 - - 7.40   PCO2 arterial 32.0 - 48.0 mmHg - - 34   Bicarbonate 20.0 - 28.0 mmol/L - - 21.1   Acid-base deficit 0.0 - 2.0 mmol/L - - 3.2   O2 Saturation % - - 98.9      Exercise Target Goals: Exercise Program Goal: Individual exercise prescription set using results from initial 6 min walk test and THRR while considering  patient's activity barriers and safety.   Exercise Prescription Goal: Initial exercise prescription builds to 30-45 minutes a day of aerobic activity, 2-3 days per week.  Home exercise guidelines will be given to patient during program as part of exercise prescription that the participant will acknowledge.   Education: Aerobic Exercise: - Group verbal and visual presentation on the components of exercise prescription. Introduces F.I.T.T principle from ACSM for exercise prescriptions.  Reviews F.I.T.T. principles of aerobic exercise including progression. Written material given at graduation. Flowsheet Row Cardiac Rehab from 11/25/2021 in [facility] Cardiac and Pulmonary Rehab  Date 09/02/21  Educator [place]  Instruction Review Code 1- Verbalizes Understanding       Education: Resistance Exercise: - Group verbal and visual presentation on the components of exercise prescription. Introduces F.I.T.T principle from ACSM for exercise prescriptions  Reviews F.I.T.T. principles of resistance exercise including progression. Written material given  at graduation. Flowsheet Row Pulmonary Rehab from 03/19/2020 in [facility] Cardiac and Pulmonary Rehab  Date 02/20/20  Educator [place]  Instruction Review Code 1- Verbalizes Understanding        Education: Exercise & Equipment Safety: - Individual verbal instruction and demonstration of equipment use and safety with use of the equipment. Flowsheet Row Cardiac Rehab from 11/25/2021 in [facility] Cardiac and Pulmonary Rehab  Education need identified 08/25/21  Date 08/25/21  Educator [place]  Instruction Review Code 1- Verbalizes Understanding       Education: Exercise Physiology & General Exercise Guidelines: - Group verbal and written instruction with models to review the exercise physiology of the cardiovascular system and associated critical values. Provides general exercise guidelines with specific guidelines to those with heart or lung disease.  Flowsheet Row Cardiac Rehab from 11/25/2021 in [facility] Cardiac and Pulmonary Rehab  Date 10/28/21  Educator kl  Instruction Review Code 1- [employer] Understanding       Education: Flexibility, Balance, Mind/Body Relaxation: - Group verbal and visual presentation with interactive activity on the components of exercise prescription. Introduces F.I.T.T principle from ACSM for exercise prescriptions. Reviews F.I.T.T. principles of flexibility and balance exercise training including progression. Also discusses the mind body connection.  Reviews various relaxation techniques to help reduce and manage stress (i.e. Deep breathing, progressive muscle relaxation, and visualization). Balance handout provided to take home. Written material given at graduation. Flowsheet Row Pulmonary Rehab from 03/19/2020 in [facility] Cardiac and Pulmonary Rehab  Date 01/02/20  Educator AS  Instruction Review Code 1- Verbalizes Understanding       Activity Barriers & Risk Stratification:  Activity Barriers & Cardiac Risk Stratification - 08/25/21 1159       Activity Barriers & Cardiac  Risk Stratification   Activity Barriers Shortness of Breath;Deconditioning;Muscular Weakness;Joint Problems;Other (comment)    Comments Right shoulder limited ROM    Cardiac Risk Stratification High             6 Minute Walk:  6 Minute Walk     Row Name 08/25/21 1200 11/25/21 1511       6 Minute Walk   Phase Initial Discharge    Distance 800 feet 1140 feet    Distance % Change -- 42 %    Distance Feet Change -- 340 ft    Walk Time 6 minutes 6 minutes    # of Rest Breaks 0 0    MPH 1.51 2.15    METS 1.19 1.73    RPE 13 13    Perceived Dyspnea  0 0    VO2 Peak 4.19 6.05    Symptoms Yes (comment) Yes (comment)    Comments Fatigue, Bilateral tightness in hips Right calf pain 7/10    Resting HR 72 bpm 57 bpm    Resting BP 108/58 118/60    Resting Oxygen Saturation  97 % 96 %    Exercise Oxygen Saturation  during 6 min walk 98 % 93 %    Max Ex. HR 93 bpm 84 bpm    Max Ex. BP 122/64 138/62    2 Minute Post BP 112/58 --             Oxygen Initial Assessment:   Oxygen Re-Evaluation:   Oxygen Discharge (Final Oxygen Re-Evaluation):   Initial Exercise Prescription:  Initial Exercise Prescription - 08/25/21 1400       Date of Initial Exercise RX and Referring Provider   Date 08/25/21    Referring Provider Loma Boston MD      Oxygen   Maintain Oxygen Saturation 88% or higher      Treadmill   MPH 1.3    Grade 0    Minutes 15    METs 2      NuStep   Level 1    SPM 80    Minutes 15    METs 1.1      T5 Nustep   Level 1    SPM 80    Minutes 15    METs 1.1      Biostep-RELP   Level 1    SPM 50    Minutes 15    METs 1.1      Prescription Details   Frequency (times per week) 2    Duration Progress to 30 minutes of continuous aerobic without signs/symptoms of physical distress  Intensity   THRR 40-80% of Max Heartrate 98 - 124    Ratings of Perceived Exertion 11-13    Perceived Dyspnea 0-4      Progression   Progression Continue to  progress workloads to maintain intensity without signs/symptoms of physical distress.      Resistance Training   Training Prescription Yes    Weight 3 lb    Reps 10-15             Perform Capillary Blood Glucose checks as needed.  Exercise Prescription Changes:   Exercise Prescription Changes     Row Name 08/25/21 1400 09/07/21 0800 09/21/21 0800 09/21/21 1500 10/05/21 0800     Response to Exercise   Blood Pressure (Admit) 108/58 128/64 114/62 -- 120/62   Blood Pressure (Exercise) 122/64 184/64 138/62 -- 124/68   Blood Pressure (Exit) 112/58 110/62 110/62 -- 106/70   Heart Rate (Admit) 72 bpm 71 bpm 67 bpm -- 53 bpm   Heart Rate (Exercise) 93 bpm 99 bpm 106 bpm -- 108 bpm   Heart Rate (Exit) 69 bpm 68 bpm 80 bpm -- 62 bpm   Oxygen Saturation (Admit) 97 % -- -- -- --   Oxygen Saturation (Exercise) 98 % -- -- -- --   Oxygen Saturation (Exit) 96 % -- -- -- --   Rating of Perceived Exertion (Exercise) '13 12 14 ' -- 13   Perceived Dyspnea (Exercise) 0 -- -- -- --   Symptoms Fatigue, bilateral tightness in hips fatigue none -- none   Comments walk test results first full day of exercise -- -- --   Duration -- Continue with 30 min of aerobic exercise without signs/symptoms of physical distress. Continue with 30 min of aerobic exercise without signs/symptoms of physical distress. -- Continue with 30 min of aerobic exercise without signs/symptoms of physical distress.   Intensity -- THRR unchanged THRR unchanged -- THRR unchanged     Progression   Progression -- Continue to progress workloads to maintain intensity without signs/symptoms of physical distress. Continue to progress workloads to maintain intensity without signs/symptoms of physical distress. -- Continue to progress workloads to maintain intensity without signs/symptoms of physical distress.   Average METs -- 1.75 2.06 -- 2.26     Resistance Training   Training Prescription -- Yes Yes -- Yes   Weight -- 3 lb 3 lb -- 3 lb    Reps -- 10-15 10-15 -- 10-15     Interval Training   Interval Training -- No No -- No     Treadmill   MPH -- -- 1.3 -- 1.5   Grade -- -- 1 -- 1   Minutes -- -- 15 -- 15   METs -- -- 2.17 -- 2.35     Recumbant Bike   Level -- -- -- -- 1   Watts -- -- -- -- 16   Minutes -- -- -- -- 15   METs -- -- -- -- 2.71     NuStep   Level -- 1 2 -- 1   Minutes -- 15 15 -- 15   METs -- 1.5 2.1 -- 2.3     T5 Nustep   Level -- -- 1 -- 1   Minutes -- -- 15 -- 15   METs -- -- -- -- 2.35     Biostep-RELP   Level -- 1 1 -- 1   Minutes -- 15 15 -- 15   METs -- 2 2 -- 2     Home Exercise  Plan   Plans to continue exercise at -- -- -- [employer] (comment)  [employer] (comment)  [employer]   Frequency -- -- -- Add 2 additional days to program exercise sessions. Add 2 additional days to program exercise sessions.   Initial Home Exercises Provided -- -- -- 09/21/21 09/21/21     Oxygen   Maintain Oxygen Saturation -- 88% or higher 88% or higher 88% or higher 88% or higher    Row Name 10/19/21 0700 11/03/21 0900 11/17/21 1300 11/30/21 1400       Response to Exercise   Blood Pressure (Admit) 112/58 112/66 124/60 118/60    Blood Pressure (Exercise) 142/72 -- -- 138/62    Blood Pressure (Exit) 108/68 126/70 136/58 116/62    Heart Rate (Admit) 54 bpm 64 bpm 69 bpm 57 bpm    Heart Rate (Exercise) 105 bpm 85 bpm 92 bpm 84 bpm    Heart Rate (Exit) 68 bpm 72 bpm 75 bpm 72 bpm    Oxygen Saturation (Admit) -- -- -- 96 %    Oxygen Saturation (Exercise) -- -- -- 93 %    Oxygen Saturation (Exit) -- -- -- 95 %    Rating of Perceived Exertion (Exercise) '13 14 15 13    ' Perceived Dyspnea (Exercise) -- -- -- 0    Symptoms none none none none    Duration Continue with 30 min of aerobic exercise without signs/symptoms of physical distress. Continue with 30 min of aerobic exercise without signs/symptoms of physical distress. Continue with 30 min of aerobic exercise without  signs/symptoms of physical distress. Continue with 30 min of aerobic exercise without signs/symptoms of physical distress.    Intensity THRR unchanged THRR unchanged THRR unchanged THRR unchanged      Progression   Progression Continue to progress workloads to maintain intensity without signs/symptoms of physical distress. Continue to progress workloads to maintain intensity without signs/symptoms of physical distress. Continue to progress workloads to maintain intensity without signs/symptoms of physical distress. Continue to progress workloads to maintain intensity without signs/symptoms of physical distress.    Average METs 2.18 2.45 2.75 2.42      Resistance Training   Training Prescription Yes Yes Yes Yes    Weight 4 lb 4 lb  started resistance bands 4 lb 4 lb    Reps 10-15 10-15 10-15 10-15      Interval Training   Interval Training No No No No      Treadmill   MPH -- 1.7 -- 2.3    Grade -- 2 -- 1    Minutes -- 15 -- 15    METs -- 2.77 -- 3.08      Recumbant Bike   Level 2 2 -- --    Watts 19 20 -- --    Minutes 15 15 -- --    METs 3.08 2.84 -- --      NuStep   Level 2 -- 3 3    Minutes 15 -- 15 15    METs 2.1 -- 2 2.2      T5 Nustep   Level 2 1 -- 2    Minutes 15 15 -- 15    METs 1.9 2 -- 2.2      Biostep-RELP   Level '2 2 2 2    ' Minutes '15 15 15 15    ' METs '2 2 3 2      ' Home Exercise Plan   Plans to continue exercise at [employer] (  comment)  [employer] (comment)  [employer] (comment)  [employer] (comment)  [employer]    Frequency Add 2 additional days to program exercise sessions. Add 2 additional days to program exercise sessions. Add 2 additional days to program exercise sessions. Add 2 additional days to program exercise sessions.    Initial Home Exercises Provided 09/21/21 09/21/21 09/21/21 09/21/21      Oxygen   Maintain Oxygen Saturation 88% or higher 88% or higher 88% or  higher 88% or higher             Exercise Comments:   Exercise Comments     Row Name 09/02/21 1332           Exercise Comments First full day of exercise!  Patient was oriented to gym and equipment including functions, settings, policies, and procedures.  Patient's individual exercise prescription and treatment plan were reviewed.  All starting workloads were established based on the results of the 6 minute walk test done at initial orientation visit.  The plan for exercise progression was also introduced and progression will be customized based on patient's performance and goals.                Exercise Goals and Review:   Exercise Goals     Row Name 08/25/21 1426             Exercise Goals   Increase Physical Activity Yes       Intervention Provide advice, education, support and counseling about physical activity/exercise needs.;Develop an individualized exercise prescription for aerobic and resistive training based on initial evaluation findings, risk stratification, comorbidities and participant's personal goals.       Expected Outcomes Short Term: Attend rehab on a regular basis to increase amount of physical activity.;Long Term: Add in home exercise to make exercise part of routine and to increase amount of physical activity.;Long Term: Exercising regularly at least 3-5 days a week.       Increase Strength and Stamina Yes       Intervention Provide advice, education, support and counseling about physical activity/exercise needs.;Develop an individualized exercise prescription for aerobic and resistive training based on initial evaluation findings, risk stratification, comorbidities and participant's personal goals.       Expected Outcomes Short Term: Increase workloads from initial exercise prescription for resistance, speed, and METs.;Short Term: Perform resistance training exercises routinely during rehab and add in resistance training at home;Long Term: Improve  cardiorespiratory fitness, muscular endurance and strength as measured by increased METs and functional capacity (6MWT)       Able to understand and use rate of perceived exertion (RPE) scale Yes       Intervention Provide education and explanation on how to use RPE scale       Expected Outcomes Short Term: Able to use RPE daily in rehab to express subjective intensity level;Long Term:  Able to use RPE to guide intensity level when exercising independently       Able to understand and use Dyspnea scale Yes       Intervention Provide education and explanation on how to use Dyspnea scale       Expected Outcomes Short Term: Able to use Dyspnea scale daily in rehab to express subjective sense of shortness of breath during exertion;Long Term: Able to use Dyspnea scale to guide intensity level when exercising independently       Knowledge and understanding of Target Heart Rate Range (THRR) Yes  Intervention Provide education and explanation of THRR including how the numbers were predicted and where they are located for reference       Expected Outcomes Short Term: Able to state/look up THRR;Short Term: Able to use daily as guideline for intensity in rehab;Long Term: Able to use THRR to govern intensity when exercising independently       Able to check pulse independently Yes       Intervention Provide education and demonstration on how to check pulse in carotid and radial arteries.;Review the importance of being able to check your own pulse for safety during independent exercise       Expected Outcomes Short Term: Able to explain why pulse checking is important during independent exercise;Long Term: Able to check pulse independently and accurately       Understanding of Exercise Prescription Yes       Intervention Provide education, explanation, and written materials on patient's individual exercise prescription       Expected Outcomes Short Term: Able to explain program exercise prescription;Long  Term: Able to explain home exercise prescription to exercise independently                Exercise Goals Re-Evaluation :  Exercise Goals Re-Evaluation     Row Name 09/02/21 1332 09/07/21 0824 09/21/21 0851 09/21/21 1445 10/05/21 0807     Exercise Goal Re-Evaluation   Exercise Goals Review Increase Physical Activity;Able to understand and use rate of perceived exertion (RPE) scale;Knowledge and understanding of Target Heart Rate Range (THRR);Understanding of Exercise Prescription;Able to check pulse independently;Able to understand and use Dyspnea scale;Increase Strength and Stamina Increase Physical Activity;Increase Strength and Stamina;Understanding of Exercise Prescription Increase Physical Activity;Increase Strength and Stamina;Understanding of Exercise Prescription Increase Physical Activity;Increase Strength and Stamina;Understanding of Exercise Prescription Increase Physical Activity;Increase Strength and Stamina;Understanding of Exercise Prescription   Comments Reviewed RPE and dyspnea scales, THR and program prescription with pt today.  Pt voiced understanding and was given a copy of goals to take home. Falon has completed his first full day of exercise!  He was able to his full 30 minutes on his first day.  We will continue to monitor his progression. Mohammedali continues to do well in rehab. He added on a 1% incline on the treadmill and was able to increase to level 2 on the T4 Nustep. We will continue to monitor. Reviewed home exercise with pt today.  Pt plans to walk and go to [employer] for exercise. Ervin has a membership to [employer] and wants to start getting back into it. Reviewed THR, pulse, RPE, sign and symptoms, pulse oximetery and when to call 911 or MD.  Also discussed weather considerations and indoor options.  Pt voiced understanding. Russell is doing well in rehab. He recently improved his overall average MET level to 2.26 METs. He also improved his speed on the  treadmill to 1.5 mph while maintaining an incline of 1%. He has also tolerated 3 lb hand weights for resistance training as well. We will continue to monitor his progress in the program.   Expected Outcomes Short: Use RPE daily to regulate intensity. Long: Follow program prescription in THR. Short: Continue to attend rehab regularly Long: Continue to follow program prescription Short: Continue to increase workloads on treadmill Long: Continue to increase overall MET level Short: Start 1 day at [place]: Exercise independently at home at appropriate prescription Short: Continue to increase speed on treadmill. Long: Continue to improve strength and stamina.  [place] Name 10/19/21 0735 10/19/21 1336 11/03/21 0919 11/17/21 1349 11/30/21 1456     Exercise Goal Re-Evaluation   Exercise Goals Review Increase Physical Activity;Increase Strength and Stamina;Understanding of Exercise Prescription Increase Physical Activity;Increase Strength and Stamina;Understanding of Exercise Prescription Increase Physical Activity;Increase Strength and Stamina;Understanding of Exercise Prescription Increase Physical Activity;Increase Strength and Stamina;Understanding of Exercise Prescription Increase Physical Activity;Increase Strength and Stamina;Understanding of Exercise Prescription   Comments Pop is doing well rehab. He has kept his overall average MET level above 2.18 METs. He also has improved to level 2 on the biostep, T4, T5, and recumbent bike. He increased from 3 lb to 4 lb hand weights for resistance training as well. We will continue to monitor his progress in the program. Pop reports he staying active at home and around the yard. 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 Discussed that while activity is important, structured exercise for at least 20 minutes in his THR of 98-124; encouragred him to exercise outside of rehab at least 1-2x/week. Pop continues to do well in rehab.  Has has increased his workload on the treadmill to 1.7 mph with a 2% incline! He also got his recumbent bike watts up to 20, we hope to see that improve more overtime. We start to introduce resistance bands during their resistance training. We will continue to monitor. Pop is doimg well in rehab. He recently increased his overall average MET level to 2.75 METs. He also improved to level 3 on the T4. He has done well with 4 lb hand weights and resistance bands as well. We will continue to monitor his progress in the program. Normal improved his post 6MWT by 42%!!  He is planning to continue to walk on his own after graduation and go to [employer].  We will continue to monitor his progress.   Expected Outcomes Short: Try increased speed on the treadmill. Long: Continue to improve strength and stamina. Short: Go to [employer] 1-2x/week. Long: Continue to improve strength and stamina. Short: Increase watts on recumbent bike Long: Continue to increase overall MET level Short: Continue to increase workloads and walk more. Long: continue to increas strength and stamina. Continue to exercise independently            Discharge Exercise Prescription (Final Exercise Prescription Changes):  Exercise Prescription Changes - 11/30/21 1400       Response to Exercise   Blood Pressure (Admit) 118/60    Blood Pressure (Exercise) 138/62    Blood Pressure (Exit) 116/62    Heart Rate (Admit) 57 bpm    Heart Rate (Exercise) 84 bpm    Heart Rate (Exit) 72 bpm    Oxygen Saturation (Admit) 96 %    Oxygen Saturation (Exercise) 93 %    Oxygen Saturation (Exit) 95 %    Rating of Perceived Exertion (Exercise) 13    Perceived Dyspnea (Exercise) 0    Symptoms none    Duration Continue with 30 min of aerobic exercise without signs/symptoms of physical distress.    Intensity THRR unchanged      Progression   Progression Continue to progress workloads to maintain intensity without signs/symptoms of physical  distress.    Average METs 2.42      Resistance Training   Training Prescription Yes    Weight 4 lb    Reps 10-15      Interval Training   Interval Training No      Treadmill   MPH  2.3    Grade 1    Minutes 15    METs 3.08      NuStep   Level 3    Minutes 15    METs 2.2      T5 Nustep   Level 2    Minutes 15    METs 2.2      Biostep-RELP   Level 2    Minutes 15    METs 2      Home Exercise Plan   Plans to continue exercise at [employer] (comment)   [employer]   Frequency Add 2 additional days to program exercise sessions.    Initial Home Exercises Provided 09/21/21      Oxygen   Maintain Oxygen Saturation 88% or higher             Nutrition:  Target Goals: Understanding of nutrition guidelines, daily intake of sodium <1550m, cholesterol <2055m calories 30% from fat and 7% or less from saturated fats, daily to have 5 or more servings of fruits and vegetables.  Education: All About Nutrition: -Group instruction provided by verbal, written material, interactive activities, discussions, models, and posters to present general guidelines for heart healthy nutrition including fat, fiber, MyPlate, the role of sodium in heart healthy nutrition, utilization of the nutrition label, and utilization of this knowledge for meal planning. Follow up email sent as well. Written material given at graduation. [facility]  Education need identified 08/25/21  Date 11/25/21  Educator MCRipleyInstruction Review Code 1- Verbalizes Understanding       Biometrics:  Pre Biometrics - 08/25/21 1159       Pre Biometrics   Height 5' 3.5" (1.613 m)    Weight 155 lb 1.6 oz (70.4 kg)    BMI (Calculated) 27.04    Single Leg Stand 2.59 seconds             Post Biometrics - 11/25/21 1511        Post  Biometrics   Height 5' 3.5" (1.613 m)    Weight 163 lb 12.8 oz (74.3 kg)    BMI (Calculated) 28.56              Nutrition Therapy Plan and Nutrition Goals:  Nutrition Therapy & Goals - 10/05/21 1107       Nutrition Therapy   Diet Heart healhty, low Na, Pulmonary MNT    Drug/Food Interactions Statins/Certain Fruits    Protein (specify units) 85g    Fiber 30 grams    Whole Grain Foods 3 servings    Saturated Fats 16 max. grams    Fruits and Vegetables 8 servings/day    Sodium 2 grams      Personal Nutrition Goals   Nutrition Goal ST: add protein to breakfast, practice MyPlate guidlines for meals LT: Maintain kidney function, create meals with MyPlate structure, meet protein needs.    Comments [age].o. M admitted to cardiac rehab s/p CABG x1. PMHx includes CKD stg 3, secondary hyperparathyroidism, anemia, COPD, GERD, HTN, HLD. Relevant medications includes vit C, vit D3, dulcolax, epoetin, dronabinol, ferrous sulfate, reglan, nepro, omega-3 , omeprazole, miralax, crestor. NoRocketas on dialyisis, but since his kidney function is improving he has stopped; he reports now his doctors will check in for a follow-up every 6 months. Discussed healthy eating for CKD stg 3. NoMajestynd his wife are trying to stay away from bread as they feel they eat too  many sandwiches. They go out to eat <2-3x/week, will sometimes get fast food or fish or chicken. He reports using liquid plant oil (canola) and sometimes butter and they limit salt and have tried to cut back. Food recall: Nixon reports difficulty remembering what he has for lunch and dinner most days as it can vary. B: oatmeal with brown sugar or cheerios or corn fakes sometimes or sometimes gravy with toast L: fast food (whopper) or wife will cook S: will not always snack: sometimes salsa or chocolate pudding D: soup (canned - chicken noodle), wife will cook chicken or pork chop with vegetables. he reports issues swallowing rice as he feels it slows down in his throat, but reports no other issues with anything else; suggested trying to pair rice with  liquid such as sauce to add moisture - if the problem persists, consider limiting rice. Drinks: diet drinks, milk, or sweet tea. However, he will mostly have water; he does not have the nepro as often as he does not like it. Discussed heart healthy as well as suggested including protein with breakfast: peanut butter or eggs for example, encouraged vairety of fruits and vegetables, and provided examples for quick and easy meals as he reports they will cook less (microwavable grains, baked potatoes and sweet potatoes, frozen vegetables, canned chicken, or rotisserie chicken).      Intervention Plan   Intervention Prescribe, educate and counsel regarding individualized specific dietary modifications aiming towards targeted core components such as weight, hypertension, lipid management, diabetes, heart failure and other comorbidities.    Expected Outcomes Short Term Goal: Understand basic principles of dietary content, such as calories, fat, sodium, cholesterol and nutrients.;Short Term Goal: A plan has been developed with personal nutrition goals set during dietitian appointment.;Long Term Goal: Adherence to prescribed nutrition plan.             Nutrition Assessments:  MEDIFICTS Score Key: ?70 Need to make dietary changes  40-70 Heart Healthy Diet ? 40 Therapeutic Level Cholesterol Diet  Flowsheet Row Cardiac Rehab from 08/25/2021 in [facility] Cardiac and Pulmonary Rehab  Picture Your Plate Total Score on Admission 54      Picture Your Plate Scores: <38 Unhealthy dietary pattern with much room for improvement. 41-50 Dietary pattern unlikely to meet recommendations for good health and room for improvement. 51-60 More healthful dietary pattern, with some room for improvement.  >60 Healthy dietary pattern, although there may be some specific behaviors that could be improved.    Nutrition Goals Re-Evaluation:  Nutrition Goals Re-Evaluation     [place] Name 09/21/21 1343 10/19/21 1344 11/23/21 1341          Goals   Current Weight -- -- 166 lb (75.3 kg)     Nutrition Goal Patient is scheduled for appointmenmt with the RD on 8/1. ST: continue to limit salt, practice MyPlate guidlines for meals LT: Maintain kidney function, create meals with MyPlate structure, meet protein needs. lose some weight. Reduce eating out.     Comment -- Vivek reports that he is mindful of the salt he is eating as his appetite has improved and he is eating at least 3x/day. him and his wife go out to eat now about 1x/week, will sometimes get fast food or fish or chicken. He reports using liquid plant oil (canola) and sometimes butter and they limit salt and have tried to cut back. B: oatmeal with brown sugar (sometimes will have peanut) or scrambled egg and small glass of milk with OJ L: wife  will cook similar to dinner S: will not always snack: sometimes salsa or chocolate pudding D: soup (canned - chicken noodle), wife will cook chicken or pork chop with vegetables. Drinks: diet drinks, milk, or sweet tea and has been trying to have mostly water. Mubarak states that he would like to keep his weight in check and states that he eats out to much. He is going to try to work on eating at home more.     Expected Outcome Short: Meet with RD Long: Continue to eat heart healthy diet ST: continue to limit salt, practice MyPlate guidlines for meals LT: Maintain kidney function, create meals with MyPlate structure, meet protein needs. Short: eat at home more. Long: seldome eat out.              Nutrition Goals Discharge (Final Nutrition Goals Re-Evaluation):  Nutrition Goals Re-Evaluation - 11/23/21 1341       Goals   Current Weight 166 lb (75.3 kg)    Nutrition Goal lose some weight. Reduce eating out.    Comment Jada states that he would like to keep his weight in check and states that he eats out to much. He is going to try to work on eating at home more.    Expected Outcome Short: eat at home more. Long: seldome eat  out.             Psychosocial: Target Goals: Acknowledge presence or absence of significant depression and/or stress, maximize coping skills, provide positive support system. Participant is able to verbalize types and ability to use techniques and skills needed for reducing stress and depression.   Education: Stress, Anxiety, and Depression - Group verbal and visual presentation to define topics covered.  Reviews how body is impacted by stress, anxiety, and depression.  Also discusses healthy ways to reduce stress and to treat/manage anxiety and depression.  Written material given at graduation. Flowsheet Row Cardiac Rehab from 11/25/2021 in [facility] Cardiac and Pulmonary Rehab  Date 10/21/21  Educator NT  Instruction Review Code 1- [employer] Understanding       Education: Sleep Hygiene -Provides group verbal and written instruction about how sleep can affect your health.  Define sleep hygiene, discuss sleep cycles and impact of sleep habits. Review good sleep hygiene tips.    Initial Review & Psychosocial Screening:  Initial Psych Review & Screening - 08/17/21 1516       Initial Review   Current issues with None Identified      Family Dynamics   Good Support System? Yes   wife  son     Barriers   Psychosocial barriers to participate in program There are no identifiable barriers or psychosocial needs.      Screening Interventions   Interventions Encouraged to exercise    Expected Outcomes Short Term goal: Utilizing psychosocial counselor, staff and physician to assist with identification of specific Stressors or current issues interfering with healing process. Setting desired goal for each stressor or current issue identified.;Long Term Goal: Stressors or current issues are controlled or eliminated.;Short Term goal: Identification and review with participant of any Quality of Life or Depression concerns found by scoring the questionnaire.;Long Term goal: The participant improves  quality of Life and PHQ9 Scores as seen by post scores and/or verbalization of changes             Quality of Life Scores:   Quality of Life - 09/07/21 1335       Quality of Life   Select  Quality of Life      Quality of Life Scores   Health/Function Pre 21.77 %    Socioeconomic Pre 26.88 %    Psych/Spiritual Pre 28.43 %    Family Pre 28.8 %    GLOBAL Pre 25.27 %            Scores of 19 and below usually indicate a poorer quality of life in these areas.  A difference of  2-3 points is a clinically meaningful difference.  A difference of 2-3 points in the total score of the Quality of Life Index has been associated with significant improvement in overall quality of life, self-image, physical symptoms, and general health in studies assessing change in quality of life.  PHQ-9: Review Flowsheet       09/30/2021 08/25/2021 01/17/2020 11/22/2019  Depression screen PHQ 2/9  Decreased Interest 1 2 0 0  Down, Depressed, Hopeless 1 0 0 0  PHQ - 2 Score 2 2 0 0  Altered sleeping 0 0 0 2  Tired, decreased energy 1 3 0 1  Change in appetite 1 0 2 2  Feeling bad or failure about yourself  0 1 0 0  Trouble concentrating 0 0 0 0  Moving slowly or fidgety/restless 0 0 0 0  Suicidal thoughts 0 0 0 0  PHQ-9 Score '4 6 2 5  ' Difficult doing work/chores Not difficult at all Somewhat difficult Not difficult at all Not difficult at all   Interpretation of Total Score  Total Score Depression Severity:  1-4 = Minimal depression, 5-9 = Mild depression, 10-14 = Moderate depression, 15-19 = Moderately severe depression, 20-27 = Severe depression   Psychosocial Evaluation and Intervention:   Psychosocial Re-Evaluation:  Psychosocial Re-Evaluation     Row Name 09/21/21 1403 10/19/21 1351 11/23/21 1340         Psychosocial Re-Evaluation   Current issues with Current Stress Concerns Current Stress Concerns None Identified     Comments Author is doing well mentally overall. His son is getting  surgery as he has a cyst on throat in a couple months, but states he is not too worried. His wife is also getting testing for multiple health conditions. However, he feels he manages hia stress fairly good. He does not take any medications for depression/anxiety. He is trying to get back into [employer] with his wife again for exercise. He felt he had such a setback with his heart event and is still getting adjusted to recovering. He feels great since starting the program and looks forward to more sessions. Mervil reports that he does not have much stress, but he feels he is hyperventilating as he feels breathing deeply can feel awkward - encouraged him to practice diaphramatic breathing at rest and use PLB during exercise. He reports he spoke to his MD about this and they found some fluid, but not much; they put him on a diuretic - encouraged him to follow up with his doctor as he also has COPD - he sees him tomorrow. Patient reports no issues with their current mental states, sleep, stress, depression or anxiety. Will follow up with patient in a few weeks for any changes.     Expected Outcomes Short: Continue routine attendance with rehab Long: Continue to maintain positive attitude Short: continue to exercise for mental health boost, speak with MD about continued shortness of breath Long: Continue to maintain positive attitude Short: Continue to exercise regularly to support mental health and notify staff of any changes.  Long: maintain mental health and well being through teaching of rehab or prescribed medications independently.     Interventions Encouraged to attend Cardiac Rehabilitation for the exercise Encouraged to attend Cardiac Rehabilitation for the exercise Encouraged to attend Cardiac Rehabilitation for the exercise     Continue Psychosocial Services  Follow up required by staff Follow up required by staff No Follow up required              Psychosocial Discharge (Final Psychosocial  Re-Evaluation):  Psychosocial Re-Evaluation - 11/23/21 1340       Psychosocial Re-Evaluation   Current issues with None Identified    Comments Patient reports no issues with their current mental states, sleep, stress, depression or anxiety. Will follow up with patient in a few weeks for any changes.    Expected Outcomes Short: Continue to exercise regularly to support mental health and notify staff of any changes. Long: maintain mental health and well being through teaching of rehab or prescribed medications independently.    Interventions Encouraged to attend Cardiac Rehabilitation for the exercise    Continue Psychosocial Services  No Follow up required             Vocational Rehabilitation: Provide vocational rehab assistance to qualifying candidates.   Vocational Rehab Evaluation & Intervention:   Education: Education Goals: Education classes will be provided on a variety of topics geared toward better understanding of heart health and risk factor modification. Participant will state understanding/return demonstration of topics presented as noted by education test scores.  Learning Barriers/Preferences:   General Cardiac Education Topics:  AED/CPR: - Group verbal and written instruction with the use of models to demonstrate the basic use of the AED with the basic ABC's of resuscitation.   Anatomy and Cardiac Procedures: - Group verbal and visual presentation and models provide information about basic cardiac anatomy and function. Reviews the testing methods done to diagnose heart disease and the outcomes of the test results. Describes the treatment choices: Medical Management, Angioplasty, or Coronary Bypass Surgery for treating various heart conditions including Myocardial Infarction, Angina, Valve Disease, and Cardiac Arrhythmias.  Written material given at graduation. Flowsheet Row Cardiac Rehab from 11/25/2021 in [facility] Cardiac and Pulmonary Rehab  Date 11/18/21  Educator  SB  Instruction Review Code 1- Verbalizes Understanding       Medication Safety: - Group verbal and visual instruction to review commonly prescribed medications for heart and lung disease. Reviews the medication, class of the drug, and side effects. Includes the steps to properly store meds and maintain the prescription regimen.  Written material given at graduation. Flowsheet Row Cardiac Rehab from 11/25/2021 in [facility] Cardiac and Pulmonary Rehab  Date 09/30/21  Educator SB  Instruction Review Code 1- Verbalizes Understanding       Intimacy: - Group verbal instruction through game format to discuss how heart and lung disease can affect sexual intimacy. Written material given at graduation.. Flowsheet Row Cardiac Rehab from 11/25/2021 in [facility] Cardiac and Pulmonary Rehab  Date 09/02/21  Educator [place]  Instruction Review Code 1- Verbalizes Understanding       Know Your Numbers and Heart Failure: - Group verbal and visual instruction to discuss disease risk factors for cardiac and pulmonary disease and treatment options.  Reviews associated critical values for Overweight/Obesity, Hypertension, Cholesterol, and Diabetes.  Discusses basics of heart failure: signs/symptoms and treatments.  Introduces Heart Failure Zone chart for action plan for heart failure.  Written material given at graduation. Flowsheet Row Cardiac Rehab from 11/25/2021  in [facility] Cardiac and Pulmonary Rehab  Date 10/07/21  Educator SB  Instruction Review Code 1- Verbalizes Understanding       Infection Prevention: - Provides verbal and written material to individual with discussion of infection control including proper hand washing and proper equipment cleaning during exercise session. Flowsheet Row Cardiac Rehab from 11/25/2021 in [facility] Cardiac and Pulmonary Rehab  Education need identified 08/25/21  Date 08/25/21  Educator [place]  Instruction Review Code 1- Verbalizes Understanding       Falls Prevention: - Provides  verbal and written material to individual with discussion of falls prevention and safety. Flowsheet Row Cardiac Rehab from 11/25/2021 in [facility] Cardiac and Pulmonary Rehab  Education need identified 08/25/21  Date 08/25/21  Educator [place]  Instruction Review Code 1- Verbalizes Understanding       Other: -Provides group and verbal instruction on various topics (see comments)   Knowledge Questionnaire Score:  Knowledge Questionnaire Score - 09/07/21 1334       Knowledge Questionnaire Score   Pre Score cardiac: 20/26: 7heart, 9angina, 10sex, 11diabetes, 17nutrition, 19exercise             Core Components/Risk Factors/Patient Goals at Admission:  Personal Goals and Risk Factors at Admission - 08/25/21 1427       Core Components/Risk Factors/Patient Goals on Admission    Weight Management Yes;Weight Maintenance    Intervention Weight Management: Develop a combined nutrition and exercise program designed to reach desired caloric intake, while maintaining appropriate intake of nutrient and fiber, sodium and fats, and appropriate energy expenditure required for the weight goal.;Weight Management: Provide education and appropriate resources to help participant work on and attain dietary goals.;Weight Management/Obesity: Establish reasonable short term and long term weight goals.    Admit Weight 155 lb (70.3 kg)    Goal Weight: Short Term 155 lb (70.3 kg)    Goal Weight: Long Term 155 lb (70.3 kg)    Expected Outcomes Short Term: Continue to assess and modify interventions until short term weight is achieved;Weight Maintenance: Understanding of the daily nutrition guidelines, which includes 25-35% calories from fat, 7% or less cal from saturated fats, less than 284m cholesterol, less than 1.5gm of sodium, & 5 or more servings of fruits and vegetables daily;Understanding recommendations for meals to include 15-35% energy as protein, 25-35% energy from fat, 35-60% energy from carbohydrates, less  than 2052mof dietary cholesterol, 20-35 gm of total fiber daily;Understanding of distribution of calorie intake throughout the day with the consumption of 4-5 meals/snacks    Hypertension Yes    Intervention Provide education on lifestyle modifcations including regular physical activity/exercise, weight management, moderate sodium restriction and increased consumption of fresh fruit, vegetables, and low fat dairy, alcohol moderation, and smoking cessation.;Monitor prescription use compliance.    Expected Outcomes Short Term: Continued assessment and intervention until BP is < 140/9040mG in hypertensive participants. < 130/67m26m in hypertensive participants with diabetes, heart failure or chronic kidney disease.;Long Term: Maintenance of blood pressure at goal levels.    Lipids Yes    Intervention Provide education and support for participant on nutrition & aerobic/resistive exercise along with prescribed medications to achieve LDL <70mg60mL >40mg.74mExpected Outcomes Short Term: Participant states understanding of desired cholesterol values and is compliant with medications prescribed. Participant is following exercise prescription and nutrition guidelines.;Long Term: Cholesterol controlled with medications as prescribed, with individualized exercise RX and with personalized nutrition plan. Value goals: LDL < 70mg, 12m> 40 mg.  Education:Diabetes - Individual verbal and written instruction to review signs/symptoms of diabetes, desired ranges of glucose level fasting, after meals and with exercise. Acknowledge that pre and post exercise glucose checks will be done for 3 sessions at entry of program.   Core Components/Risk Factors/Patient Goals Review:   Goals and Risk Factor Review     Row Name 09/21/21 1401 10/19/21 1334 11/23/21 1343         Core Components/Risk Factors/Patient Goals Review   Personal Goals Review Weight Management/Obesity;Hypertension Weight  Management/Obesity;Hypertension Weight Management/Obesity;Hypertension     Review Normal is doing well. He is maintaining his  weight around 150 lb. He is trying to maintain, if not gain, as he used to be 180 lb. We talked about making sure he is maintain appropriate muscle mass and to go off by the feeling of his  clothing. BP is checked at home and he typically runs around  120/60s. He checks it almost with the help of his wife. He is aware to check if ever feels symptomatic. He is staying compliant with his medications. Fredi reports his weight has been increasing- today 158.7 lbs; he reports his appetite is improving and he is now eating at least 3x/day, he is trying to monitor his salt to not retain fluid. He has a BP cuff at home and he will check it everyday; running 120/60s - today it was 122/64 at rehab. He has a watch to track his HR; reviewed THR during exercise. Taye reports that he feels he is hyperventilating as he feels breathing deeply can feel awkward - encouraged him to practice diaphramatic breathing at rest and use PLB during exercise. He reports he spoke to his MD about this and they found some fluid, but not much; they put him on a diuretic - encouraged him to follow up with his doctor as he also has COPD - he sees him tomorrow. [place] is checking his blood pressure at home. He is doing well in thr program and wants to continue to reduce some weight. He is about to graduate from the program and is going to continue to exercise at [employer].     Expected Outcomes Short: Continue to monitor weight Long: Continue to manage lifestyle risk factors Short: Continue to monitor weight Long: Continue to manage lifestyle risk factors Short: Graduate [facility]. Long: maintain exercise post [facility].              Core Components/Risk Factors/Patient Goals at Discharge (Final Review):   Goals and Risk Factor Review - 11/23/21 1343       Core Components/Risk Factors/Patient Goals  Review   Personal Goals Review Weight Management/Obesity;Hypertension    Review Kodi is checking his blood pressure at home. He is doing well in thr program and wants to continue to reduce some weight. He is about to graduate from the program and is going to continue to exercise at [employer].    Expected Outcomes Short: Graduate [facility]. Long: maintain exercise post [facility].             ITP Comments:  ITP Comments     Row Name 08/17/21 1535 08/25/21 0937 09/02/21 1332 09/09/21 0732 10/05/21 1200   ITP Comments Virtual orientation call completed today. he has an appointment on Date: 08/25/2021  for EP eval and gym Orientation.  Documentation of diagnosis can be found in [facility] Date: 02/20/2021 . Completed 6MWT and gym orientation. Initial ITP created and sent for review to Dr. Emily Filbert, Medical Director. First  full day of exercise!  Patient was oriented to gym and equipment including functions, settings, policies, and procedures.  Patient's individual exercise prescription and treatment plan were reviewed.  All starting workloads were established based on the results of the 6 minute walk test done at initial orientation visit.  The plan for exercise progression was also introduced and progression will be customized based on patient's performance and goals. 30 Day review completed. Medical Director ITP review done, changes made as directed, and signed approval by Medical Director.   New Completed initial RD consultation    [place] Name 10/07/21 [phone] 12/02/21 0814         ITP Comments 30 Day review completed. Medical Director ITP review done, changes made as directed, and signed approval by Medical Director. 30 Day review completed. Medical Director ITP review done, changes made as directed, and signed approval by Medical Director.               Comments:

## 2021-12-03 ENCOUNTER — Encounter: Payer: No Typology Code available for payment source | Admitting: *Deleted

## 2021-12-03 DIAGNOSIS — Z951 Presence of aortocoronary bypass graft: Secondary | ICD-10-CM

## 2021-12-03 DIAGNOSIS — Z952 Presence of prosthetic heart valve: Secondary | ICD-10-CM

## 2021-12-03 NOTE — Progress Notes (Signed)
Daily Session Note  Patient Details  Name: Phillip Chavez MRN: [id number] Date of Birth: April 02, 1940 Referring Provider:   Flowsheet Row Cardiac Rehab from 08/25/2021 in [facility] Cardiac and Pulmonary Rehab  Referring Provider Loma Boston MD       Encounter Date: 12/03/2021  Check In:  Session Check In - 12/03/21 1416       Check-In   Supervising physician immediately available to respond to emergencies See telemetry face sheet for immediately available ER MD    Location [facility]-Cardiac & Pulmonary Rehab    Staff Present Heath Lark, RN, BSN, CCRP;Joseph Hood, RCP,RRT,BSRT;Noah Tickle, [place], Exercise Physiologist    Virtual Visit No    Medication changes reported     No    Fall or balance concerns reported    No    Warm-up and Cool-down Performed on first and last piece of equipment    Resistance Training Performed Yes    VAD Patient? No    PAD/SET Patient? No      Pain Assessment   Currently in Pain? No/denies                Social History   Tobacco Use  Smoking Status Former   Packs/day: 1.00   Years: 20.00   Total pack years: 20.00   Types: Cigarettes   Quit date: 03/21/1978   Years since quitting: 43.7  Smokeless Tobacco Never    Goals Met:  Independence with exercise equipment Exercise tolerated well No report of concerns or symptoms today  Goals Unmet:  Not Applicable  Comments: Pt able to follow exercise prescription today without complaint.  Will continue to monitor for progression.    Dr. Emily Filbert is Medical Director for [place].  Dr. Ottie Glazier is Medical Director for [facility] Pulmonary Rehabilitation.

## 2021-12-07 ENCOUNTER — Encounter: Payer: No Typology Code available for payment source | Admitting: *Deleted

## 2021-12-07 DIAGNOSIS — Z951 Presence of aortocoronary bypass graft: Secondary | ICD-10-CM

## 2021-12-07 DIAGNOSIS — Z952 Presence of prosthetic heart valve: Secondary | ICD-10-CM

## 2021-12-07 NOTE — Progress Notes (Signed)
Daily Session Note  Patient Details  Name: MCCOY TESTA MRN: [id number] Date of Birth: 19-Jun-1940 Referring Provider:   Flowsheet Row Cardiac Rehab from 08/25/2021 in [facility] Cardiac and Pulmonary Rehab  Referring Provider Loma Boston MD       Encounter Date: 12/07/2021  Check In:  Session Check In - 12/07/21 1343       Check-In   Supervising physician immediately available to respond to emergencies See telemetry face sheet for immediately available ER MD    Location [facility]-Cardiac & Pulmonary Rehab    Staff Present Renita Papa, RN BSN;Megan Tamala Julian, RN, Terie Purser, RCP,RRT,BSRT    Virtual Visit No    Medication changes reported     No    Fall or balance concerns reported    No    Warm-up and Cool-down Performed on first and last piece of equipment    Resistance Training Performed Yes    VAD Patient? No    PAD/SET Patient? No      Pain Assessment   Currently in Pain? No/denies                Social History   Tobacco Use  Smoking Status Former   Packs/day: 1.00   Years: 20.00   Total pack years: 20.00   Types: Cigarettes   Quit date: 03/21/1978   Years since quitting: 43.7  Smokeless Tobacco Never    Goals Met:  Independence with exercise equipment Exercise tolerated well No report of concerns or symptoms today Strength training completed today  Goals Unmet:  Not Applicable  Comments: Pt able to follow exercise prescription today without complaint.  Will continue to monitor for progression.    Dr. Emily Filbert is Medical Director for [place].  Dr. Ottie Glazier is Medical Director for [facility] Pulmonary Rehabilitation.

## 2021-12-09 ENCOUNTER — Encounter: Payer: No Typology Code available for payment source | Admitting: *Deleted

## 2021-12-09 DIAGNOSIS — Z952 Presence of prosthetic heart valve: Secondary | ICD-10-CM

## 2021-12-09 DIAGNOSIS — Z951 Presence of aortocoronary bypass graft: Secondary | ICD-10-CM

## 2021-12-09 NOTE — Progress Notes (Signed)
Daily Session Note  Patient Details  Name: Phillip Chavez MRN: [id number] Date of Birth: 1940/08/03 Referring Provider:   Flowsheet Row Cardiac Rehab from 08/25/2021 in [facility] Cardiac and Pulmonary Rehab  Referring Provider Loma Boston MD       Encounter Date: 12/09/2021  Check In:  Session Check In - 12/09/21 1346       Check-In   Supervising physician immediately available to respond to emergencies See telemetry face sheet for immediately available ER MD    Location [facility]-Cardiac & Pulmonary Rehab    Staff Present Renita Papa, RN Moises Blood, BS, ACSM CEP, Exercise Physiologist;Jessica Luan Pulling, MA, RCEP, CCRP, CCET    Virtual Visit No    Medication changes reported     No    Fall or balance concerns reported    No    Warm-up and Cool-down Performed on first and last piece of equipment    Resistance Training Performed Yes    VAD Patient? No    PAD/SET Patient? No      Pain Assessment   Currently in Pain? No/denies                Social History   Tobacco Use  Smoking Status Former   Packs/day: 1.00   Years: 20.00   Total pack years: 20.00   Types: Cigarettes   Quit date: 03/21/1978   Years since quitting: 43.7  Smokeless Tobacco Never    Goals Met:  Independence with exercise equipment Exercise tolerated well No report of concerns or symptoms today Strength training completed today  Goals Unmet:  Not Applicable  Comments:  Caige graduated today from  rehab with 36 sessions completed.  Details of the patient's exercise prescription and what He needs to do in order to continue the prescription and progress were discussed with patient.  Patient was given a copy of prescription and goals.  Patient verbalized understanding.  Phillip Chavez plans to continue to exercise by walking.    Dr. Emily Filbert is Medical Director for Phillip Chavez.  Dr. Ottie Glazier is Medical Director for [facility] Pulmonary Rehabilitation.

## 2021-12-09 NOTE — Progress Notes (Signed)
Discharge Summary: Muscab Brenneman (DOB: 03/01/1941)   Mariea Clonts graduated today from  rehab with 36 sessions completed.  Details of the patient's exercise prescription and what He needs to do in order to continue the prescription and progress were discussed with patient.  Patient was given a copy of prescription and goals.  Patient verbalized understanding.  Kache plans to continue to exercise by walking.   6 Minute Walk     Row Name 08/25/21 1200 11/25/21 1511       6 Minute Walk   Phase Initial Discharge    Distance 800 feet 1140 feet    Distance % Change -- 42 %    Distance Feet Change -- 340 ft    Walk Time 6 minutes 6 minutes    # of Rest Breaks 0 0    MPH 1.51 2.15    METS 1.19 1.73    RPE 13 13    Perceived Dyspnea  0 0    VO2 Peak 4.19 6.05    Symptoms Yes (comment) Yes (comment)    Comments Fatigue, Bilateral tightness in hips Right calf pain 7/10    Resting HR 72 bpm 57 bpm    Resting BP 108/58 118/60    Resting Oxygen Saturation  97 % 96 %    Exercise Oxygen Saturation  during 6 min walk 98 % 93 %    Max Ex. HR 93 bpm 84 bpm    Max Ex. BP 122/64 138/62    2 Minute Post BP 112/58 --

## 2021-12-09 NOTE — Progress Notes (Signed)
Cardiac Individual Treatment Plan  Patient Details  Name: Phillip Chavez MRN: [id number] Date of Birth: [birth year] Referring Provider:   Flowsheet Row Cardiac Rehab from 08/25/2021 in [facility] Cardiac and Pulmonary Rehab  Referring Provider Loma Boston MD       Initial Encounter Date:  Flowsheet Row Cardiac Rehab from 08/25/2021 in [facility] Cardiac and Pulmonary Rehab  Date 08/25/21       Visit Diagnosis: S/P CABG x 1  S/P AVR (aortic valve replacement)  Patient's Home Medications on Admission:  Current Outpatient Medications:    albuterol (VENTOLIN HFA) 108 (90 Base) MCG/ACT inhaler, Inhale 2 puffs into the lungs every 6 (six) hours as needed for wheezing or shortness of breath., Disp: , Rfl:    ascorbic acid (VITAMIN C) 500 MG tablet, Take by mouth., Disp: , Rfl:    bisacodyl (DULCOLAX) 10 MG suppository, Place 10 mg rectally as needed for moderate constipation. (Patient not taking: Reported on 05/26/2021), Disp: , Rfl:    calamine lotion, Apply 1 application topically as needed for itching. (Patient not taking: Reported on 05/26/2021), Disp: , Rfl:    cholecalciferol (VITAMIN D3) 25 MCG (1000 UNIT) tablet, Take 1,000 Units by mouth daily., Disp: , Rfl:    dronabinol (MARINOL) 5 MG capsule, Take 5 mg by mouth 2 (two) times daily before a meal. (Patient not taking: Reported on 08/17/2021), Disp: , Rfl:    epoetin alfa (EPOGEN) 10000 UNIT/ML injection, Inject 1 mL (10,000 Units total) into the vein every Monday, Wednesday, and Friday with hemodialysis. (Patient not taking: Reported on 05/26/2021), Disp: 1 mL, Rfl:    ferrous sulfate 325 (65 FE) MG tablet, Take 1 tablet (325 mg total) by mouth daily with breakfast., Disp: , Rfl: 3   fluticasone (FLONASE) 50 MCG/ACT nasal spray, INSTILL 1 SPRAY INTO EACH NOSTRIL EVERY DAY MAXIMUM 2 SPRAYS IN EACH NOSTRIL DAILY. FOR ALLERGIES, Disp: , Rfl:    fluticasone furoate-vilanterol (BREO ELLIPTA) 200-25 MCG/ACT AEPB, Inhale 1 puff into the lungs daily.,  Disp: , Rfl:    ketotifen (ZADITOR) 0.025 % ophthalmic solution, 1 drop 2 (two) times daily., Disp: , Rfl:    losartan (COZAAR) 25 MG tablet, TAKE ONE TABLET BY MOUTH TWO TIMES A DAY FOR BLOOD PRESSURE, Disp: , Rfl:    metoCLOPramide (REGLAN) 5 MG tablet, Take 5 mg by mouth 4 (four) times daily -  before meals and at bedtime. (Patient not taking: Reported on 05/26/2021), Disp: , Rfl:    metoprolol tartrate (LOPRESSOR) 25 MG tablet, TAKE ONE-HALF TABLET BY MOUTH TWO TIMES A DAY FOR HIGH BLOOD PRESSURE, Disp: , Rfl:    Nutritional Supplements (FEEDING SUPPLEMENT, NEPRO CARB STEADY,) LIQD, Take 237 mLs by mouth 3 (three) times daily between meals. (Patient not taking: Reported on 08/17/2021), Disp: , Rfl: 0   nystatin (MYCOSTATIN/NYSTOP) powder, Apply 1 application topically 2 (two) times daily. (Patient not taking: Reported on 05/26/2021), Disp: , Rfl:    omega-3 acid ethyl esters (LOVAZA) 1 g capsule, Take 3 g by mouth 2 (two) times daily. (Patient not taking: Reported on 08/17/2021), Disp: , Rfl:    Omega-3 Fatty Acids (FISH OIL) 1000 MG CAPS, TAKE 3 CAPSULES (SEA-OMEGA 30 FISH OIL) BY MOUTH TWO TIMES A DAY FOR CHOLESTEROL, Disp: , Rfl:    pantoprazole (PROTONIX) 40 MG tablet, 1 tablet daily. (Patient not taking: Reported on 05/26/2021), Disp: , Rfl:    polyethylene glycol (MIRALAX / GLYCOLAX) 17 g packet, Take 17 g by mouth 2 (two) times daily., Disp: ,  Rfl: 0   rosuvastatin (CRESTOR) 40 MG tablet, Take 40 mg by mouth daily., Disp: , Rfl:    tamsulosin (FLOMAX) 0.4 MG CAPS capsule, Take 1 capsule (0.4 mg total) by mouth daily. (Patient not taking: Reported on 05/26/2021), Disp: , Rfl:   Past Medical History: Past Medical History:  Diagnosis Date   Chronic CHF (congestive heart failure) (HCC)    COPD (chronic obstructive pulmonary disease) (HCC)    GERD (gastroesophageal reflux disease)    HLD (hyperlipidemia)    HTN (hypertension)    Presence of permanent cardiac pacemaker     Tobacco  Use: Social History   Tobacco Use  Smoking Status Former   Packs/day: 1.00   Years: 20.00   Total pack years: 20.00   Types: Cigarettes   Quit date: 03/21/1978   Years since quitting: 43.7  Smokeless Tobacco Never    Labs: Review Flowsheet       Latest Ref Rng & Units 01/04/2021 01/05/2021 01/11/2021  Labs for ITP Cardiac and Pulmonary Rehab  Cholestrol 0 - 200 mg/dL 123  - -  LDL (calc) 0 - 99 mg/dL 86  - -  HDL-C >40 mg/dL 13  - -  Trlycerides <150 mg/dL 120  - -  Hemoglobin A1c 4.8 - 5.6 % - 6.1  -  PH, Arterial 7.350 - 7.450 - - 7.40   PCO2 arterial 32.0 - 48.0 mmHg - - 34   Bicarbonate 20.0 - 28.0 mmol/L - - 21.1   Acid-base deficit 0.0 - 2.0 mmol/L - - 3.2   O2 Saturation % - - 98.9      Exercise Target Goals: Exercise Program Goal: Individual exercise prescription set using results from initial 6 min walk test and THRR while considering  patient's activity barriers and safety.   Exercise Prescription Goal: Initial exercise prescription builds to 30-45 minutes a day of aerobic activity, 2-3 days per week.  Home exercise guidelines will be given to patient during program as part of exercise prescription that the participant will acknowledge.   Education: Aerobic Exercise: - Group verbal and visual presentation on the components of exercise prescription. Introduces F.I.T.T principle from ACSM for exercise prescriptions.  Reviews F.I.T.T. principles of aerobic exercise including progression. Written material given at graduation. Flowsheet Row Cardiac Rehab from 12/02/2021 in [facility] Cardiac and Pulmonary Rehab  Date 09/02/21  Educator Rembert  Instruction Review Code 1- Verbalizes Understanding       Education: Resistance Exercise: - Group verbal and visual presentation on the components of exercise prescription. Introduces F.I.T.T principle from ACSM for exercise prescriptions  Reviews F.I.T.T. principles of resistance exercise including progression. Written material given  at graduation. Flowsheet Row Pulmonary Rehab from 03/19/2020 in [facility] Cardiac and Pulmonary Rehab  Date 02/20/20  Educator AS  Instruction Review Code 1- Verbalizes Understanding        Education: Exercise & Equipment Safety: - Individual verbal instruction and demonstration of equipment use and safety with use of the equipment. Flowsheet Row Cardiac Rehab from 12/02/2021 in [facility] Cardiac and Pulmonary Rehab  Education need identified 08/25/21  Date 08/25/21  Educator Bier  Instruction Review Code 1- Verbalizes Understanding       Education: Exercise Physiology & General Exercise Guidelines: - Group verbal and written instruction with models to review the exercise physiology of the cardiovascular system and associated critical values. Provides general exercise guidelines with specific guidelines to those with heart or lung disease.  Flowsheet Row Cardiac Rehab from 12/02/2021 in [facility] Cardiac and Pulmonary Rehab  Date 10/28/21  Educator kl  Instruction Review Code 1- [employer] Understanding       Education: Flexibility, Balance, Mind/Body Relaxation: - Group verbal and visual presentation with interactive activity on the components of exercise prescription. Introduces F.I.T.T principle from ACSM for exercise prescriptions. Reviews F.I.T.T. principles of flexibility and balance exercise training including progression. Also discusses the mind body connection.  Reviews various relaxation techniques to help reduce and manage stress (i.e. Deep breathing, progressive muscle relaxation, and visualization). Balance handout provided to take home. Written material given at graduation. Flowsheet Row Pulmonary Rehab from 03/19/2020 in [facility] Cardiac and Pulmonary Rehab  Date 01/02/20  Educator AS  Instruction Review Code 1- Verbalizes Understanding       Activity Barriers & Risk Stratification:  Activity Barriers & Cardiac Risk Stratification - 08/25/21 1159       Activity Barriers & Cardiac  Risk Stratification   Activity Barriers Shortness of Breath;Deconditioning;Muscular Weakness;Joint Problems;Other (comment)    Comments Right shoulder limited ROM    Cardiac Risk Stratification High             6 Minute Walk:  6 Minute Walk     Row Name 08/25/21 1200 11/25/21 1511       6 Minute Walk   Phase Initial Discharge    Distance 800 feet 1140 feet    Distance % Change -- 42 %    Distance Feet Change -- 340 ft    Walk Time 6 minutes 6 minutes    # of Rest Breaks 0 0    MPH 1.51 2.15    METS 1.19 1.73    RPE 13 13    Perceived Dyspnea  0 0    VO2 Peak 4.19 6.05    Symptoms Yes (comment) Yes (comment)    Comments Fatigue, Bilateral tightness in hips Right calf pain 7/10    Resting HR 72 bpm 57 bpm    Resting BP 108/58 118/60    Resting Oxygen Saturation  97 % 96 %    Exercise Oxygen Saturation  during 6 min walk 98 % 93 %    Max Ex. HR 93 bpm 84 bpm    Max Ex. BP 122/64 138/62    2 Minute Post BP 112/58 --             Oxygen Initial Assessment:   Oxygen Re-Evaluation:   Oxygen Discharge (Final Oxygen Re-Evaluation):   Initial Exercise Prescription:  Initial Exercise Prescription - 08/25/21 1400       Date of Initial Exercise RX and Referring Provider   Date 08/25/21    Referring Provider Loma Boston MD      Oxygen   Maintain Oxygen Saturation 88% or higher      Treadmill   MPH 1.3    Grade 0    Minutes 15    METs 2      NuStep   Level 1    SPM 80    Minutes 15    METs 1.1      T5 Nustep   Level 1    SPM 80    Minutes 15    METs 1.1      Biostep-RELP   Level 1    SPM 50    Minutes 15    METs 1.1      Prescription Details   Frequency (times per week) 2    Duration Progress to 30 minutes of continuous aerobic without signs/symptoms of physical distress  Intensity   THRR 40-80% of Max Heartrate 98 - 124    Ratings of Perceived Exertion 11-13    Perceived Dyspnea 0-4      Progression   Progression Continue to  progress workloads to maintain intensity without signs/symptoms of physical distress.      Resistance Training   Training Prescription Yes    Weight 3 lb    Reps 10-15             Perform Capillary Blood Glucose checks as needed.  Exercise Prescription Changes:   Exercise Prescription Changes     Row Name 08/25/21 1400 09/07/21 0800 09/21/21 0800 09/21/21 1500 10/05/21 0800     Response to Exercise   Blood Pressure (Admit) 108/58 128/64 114/62 -- 120/62   Blood Pressure (Exercise) 122/64 184/64 138/62 -- 124/68   Blood Pressure (Exit) 112/58 110/62 110/62 -- 106/70   Heart Rate (Admit) 72 bpm 71 bpm 67 bpm -- 53 bpm   Heart Rate (Exercise) 93 bpm 99 bpm 106 bpm -- 108 bpm   Heart Rate (Exit) 69 bpm 68 bpm 80 bpm -- 62 bpm   Oxygen Saturation (Admit) 97 % -- -- -- --   Oxygen Saturation (Exercise) 98 % -- -- -- --   Oxygen Saturation (Exit) 96 % -- -- -- --   Rating of Perceived Exertion (Exercise) '13 12 14 ' -- 13   Perceived Dyspnea (Exercise) 0 -- -- -- --   Symptoms Fatigue, bilateral tightness in hips fatigue none -- none   Comments walk test results first full day of exercise -- -- --   Duration -- Continue with 30 min of aerobic exercise without signs/symptoms of physical distress. Continue with 30 min of aerobic exercise without signs/symptoms of physical distress. -- Continue with 30 min of aerobic exercise without signs/symptoms of physical distress.   Intensity -- THRR unchanged THRR unchanged -- THRR unchanged     Progression   Progression -- Continue to progress workloads to maintain intensity without signs/symptoms of physical distress. Continue to progress workloads to maintain intensity without signs/symptoms of physical distress. -- Continue to progress workloads to maintain intensity without signs/symptoms of physical distress.   Average METs -- 1.75 2.06 -- 2.26     Resistance Training   Training Prescription -- Yes Yes -- Yes   Weight -- 3 lb 3 lb -- 3 lb    Reps -- 10-15 10-15 -- 10-15     Interval Training   Interval Training -- No No -- No     Treadmill   MPH -- -- 1.3 -- 1.5   Grade -- -- 1 -- 1   Minutes -- -- 15 -- 15   METs -- -- 2.17 -- 2.35     Recumbant Bike   Level -- -- -- -- 1   Watts -- -- -- -- 16   Minutes -- -- -- -- 15   METs -- -- -- -- 2.71     NuStep   Level -- 1 2 -- 1   Minutes -- 15 15 -- 15   METs -- 1.5 2.1 -- 2.3     T5 Nustep   Level -- -- 1 -- 1   Minutes -- -- 15 -- 15   METs -- -- -- -- 2.35     Biostep-RELP   Level -- 1 1 -- 1   Minutes -- 15 15 -- 15   METs -- 2 2 -- 2     Home Exercise  Plan   Plans to continue exercise at -- -- -- [employer] (comment)  [employer] (comment)  [employer]   Frequency -- -- -- Add 2 additional days to program exercise sessions. Add 2 additional days to program exercise sessions.   Initial Home Exercises Provided -- -- -- 09/21/21 09/21/21     Oxygen   Maintain Oxygen Saturation -- 88% or higher 88% or higher 88% or higher 88% or higher    Row Name 10/19/21 0700 11/03/21 0900 11/17/21 1300 11/30/21 1400       Response to Exercise   Blood Pressure (Admit) 112/58 112/66 124/60 118/60    Blood Pressure (Exercise) 142/72 -- -- 138/62    Blood Pressure (Exit) 108/68 126/70 136/58 116/62    Heart Rate (Admit) 54 bpm 64 bpm 69 bpm 57 bpm    Heart Rate (Exercise) 105 bpm 85 bpm 92 bpm 84 bpm    Heart Rate (Exit) 68 bpm 72 bpm 75 bpm 72 bpm    Oxygen Saturation (Admit) -- -- -- 96 %    Oxygen Saturation (Exercise) -- -- -- 93 %    Oxygen Saturation (Exit) -- -- -- 95 %    Rating of Perceived Exertion (Exercise) '13 14 15 13    ' Perceived Dyspnea (Exercise) -- -- -- 0    Symptoms none none none none    Duration Continue with 30 min of aerobic exercise without signs/symptoms of physical distress. Continue with 30 min of aerobic exercise without signs/symptoms of physical distress. Continue with 30 min of aerobic exercise without  signs/symptoms of physical distress. Continue with 30 min of aerobic exercise without signs/symptoms of physical distress.    Intensity THRR unchanged THRR unchanged THRR unchanged THRR unchanged      Progression   Progression Continue to progress workloads to maintain intensity without signs/symptoms of physical distress. Continue to progress workloads to maintain intensity without signs/symptoms of physical distress. Continue to progress workloads to maintain intensity without signs/symptoms of physical distress. Continue to progress workloads to maintain intensity without signs/symptoms of physical distress.    Average METs 2.18 2.45 2.75 2.42      Resistance Training   Training Prescription Yes Yes Yes Yes    Weight 4 lb 4 lb  started resistance bands 4 lb 4 lb    Reps 10-15 10-15 10-15 10-15      Interval Training   Interval Training No No No No      Treadmill   MPH -- 1.7 -- 2.3    Grade -- 2 -- 1    Minutes -- 15 -- 15    METs -- 2.77 -- 3.08      Recumbant Bike   Level 2 2 -- --    Watts 19 20 -- --    Minutes 15 15 -- --    METs 3.08 2.84 -- --      NuStep   Level 2 -- 3 3    Minutes 15 -- 15 15    METs 2.1 -- 2 2.2      T5 Nustep   Level 2 1 -- 2    Minutes 15 15 -- 15    METs 1.9 2 -- 2.2      Biostep-RELP   Level '2 2 2 2    ' Minutes '15 15 15 15    ' METs '2 2 3 2      ' Home Exercise Plan   Plans to continue exercise at [employer] (  comment)  [employer] (comment)  [employer] (comment)  [employer] (comment)  [employer]    Frequency Add 2 additional days to program exercise sessions. Add 2 additional days to program exercise sessions. Add 2 additional days to program exercise sessions. Add 2 additional days to program exercise sessions.    Initial Home Exercises Provided 09/21/21 09/21/21 09/21/21 09/21/21      Oxygen   Maintain Oxygen Saturation 88% or higher 88% or higher 88% or  higher 88% or higher             Exercise Comments:   Exercise Comments     Row Name 09/02/21 1332           Exercise Comments First full day of exercise!  Patient was oriented to gym and equipment including functions, settings, policies, and procedures.  Patient's individual exercise prescription and treatment plan were reviewed.  All starting workloads were established based on the results of the 6 minute walk test done at initial orientation visit.  The plan for exercise progression was also introduced and progression will be customized based on patient's performance and goals.                Exercise Goals and Review:   Exercise Goals     Row Name 08/25/21 1426             Exercise Goals   Increase Physical Activity Yes       Intervention Provide advice, education, support and counseling about physical activity/exercise needs.;Develop an individualized exercise prescription for aerobic and resistive training based on initial evaluation findings, risk stratification, comorbidities and participant's personal goals.       Expected Outcomes Short Term: Attend rehab on a regular basis to increase amount of physical activity.;Long Term: Add in home exercise to make exercise part of routine and to increase amount of physical activity.;Long Term: Exercising regularly at least 3-5 days a week.       Increase Strength and Stamina Yes       Intervention Provide advice, education, support and counseling about physical activity/exercise needs.;Develop an individualized exercise prescription for aerobic and resistive training based on initial evaluation findings, risk stratification, comorbidities and participant's personal goals.       Expected Outcomes Short Term: Increase workloads from initial exercise prescription for resistance, speed, and METs.;Short Term: Perform resistance training exercises routinely during rehab and add in resistance training at home;Long Term: Improve  cardiorespiratory fitness, muscular endurance and strength as measured by increased METs and functional capacity (6MWT)       Able to understand and use rate of perceived exertion (RPE) scale Yes       Intervention Provide education and explanation on how to use RPE scale       Expected Outcomes Short Term: Able to use RPE daily in rehab to express subjective intensity level;Long Term:  Able to use RPE to guide intensity level when exercising independently       Able to understand and use Dyspnea scale Yes       Intervention Provide education and explanation on how to use Dyspnea scale       Expected Outcomes Short Term: Able to use Dyspnea scale daily in rehab to express subjective sense of shortness of breath during exertion;Long Term: Able to use Dyspnea scale to guide intensity level when exercising independently       Knowledge and understanding of Target Heart Rate Range (THRR) Yes  Intervention Provide education and explanation of THRR including how the numbers were predicted and where they are located for reference       Expected Outcomes Short Term: Able to state/look up THRR;Short Term: Able to use daily as guideline for intensity in rehab;Long Term: Able to use THRR to govern intensity when exercising independently       Able to check pulse independently Yes       Intervention Provide education and demonstration on how to check pulse in carotid and radial arteries.;Review the importance of being able to check your own pulse for safety during independent exercise       Expected Outcomes Short Term: Able to explain why pulse checking is important during independent exercise;Long Term: Able to check pulse independently and accurately       Understanding of Exercise Prescription Yes       Intervention Provide education, explanation, and written materials on patient's individual exercise prescription       Expected Outcomes Short Term: Able to explain program exercise prescription;Long  Term: Able to explain home exercise prescription to exercise independently                Exercise Goals Re-Evaluation :  Exercise Goals Re-Evaluation     Row Name 09/02/21 1332 09/07/21 0824 09/21/21 0851 09/21/21 1445 10/05/21 0807     Exercise Goal Re-Evaluation   Exercise Goals Review Increase Physical Activity;Able to understand and use rate of perceived exertion (RPE) scale;Knowledge and understanding of Target Heart Rate Range (THRR);Understanding of Exercise Prescription;Able to check pulse independently;Able to understand and use Dyspnea scale;Increase Strength and Stamina Increase Physical Activity;Increase Strength and Stamina;Understanding of Exercise Prescription Increase Physical Activity;Increase Strength and Stamina;Understanding of Exercise Prescription Increase Physical Activity;Increase Strength and Stamina;Understanding of Exercise Prescription Increase Physical Activity;Increase Strength and Stamina;Understanding of Exercise Prescription   Comments Reviewed RPE and dyspnea scales, THR and program prescription with pt today.  Pt voiced understanding and was given a copy of goals to take home. Phillip Chavez has completed his first full day of exercise!  He was able to his full 30 minutes on his first day.  We will continue to monitor his progression. Phillip Chavez continues to do well in rehab. He added on a 1% incline on the treadmill and was able to increase to level 2 on the T4 Nustep. We will continue to monitor. Reviewed home exercise with pt today.  Pt plans to walk and go to [employer] for exercise. Phillip Chavez has a membership to [employer] and wants to start getting back into it. Reviewed THR, pulse, RPE, sign and symptoms, pulse oximetery and when to call 911 or MD.  Also discussed weather considerations and indoor options.  Pt voiced understanding. Phillip Chavez is doing well in rehab. He recently improved his overall average MET level to 2.26 METs. He also improved his speed on the  treadmill to 1.5 mph while maintaining an incline of 1%. He has also tolerated 3 lb hand weights for resistance training as well. We will continue to monitor his progress in the program.   Expected Outcomes Short: Use RPE daily to regulate intensity. Long: Follow program prescription in THR. Short: Continue to attend rehab regularly Long: Continue to follow program prescription Short: Continue to increase workloads on treadmill Long: Continue to increase overall MET level Short: Start 1 day at [place]: Exercise independently at home at appropriate prescription Short: Continue to increase speed on treadmill. Long: Continue to improve strength and stamina.  [place] Name 10/19/21 0735 10/19/21 1336 11/03/21 0919 11/17/21 1349 11/30/21 1456     Exercise Goal Re-Evaluation   Exercise Goals Review Increase Physical Activity;Increase Strength and Stamina;Understanding of Exercise Prescription Increase Physical Activity;Increase Strength and Stamina;Understanding of Exercise Prescription Increase Physical Activity;Increase Strength and Stamina;Understanding of Exercise Prescription Increase Physical Activity;Increase Strength and Stamina;Understanding of Exercise Prescription Increase Physical Activity;Increase Strength and Stamina;Understanding of Exercise Prescription   Comments Phillip Chavez is doing well rehab. He has kept his overall average MET level above 2.18 METs. He also has improved to level 2 on the biostep, T4, T5, and recumbent bike. He increased from 3 lb to 4 lb hand weights for resistance training as well. We will continue to monitor his progress in the program. Phillip Chavez reports he staying active at home and around the yard. 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 Discussed that while activity is important, structured exercise for at least 20 minutes in his THR of 98-124; encouragred him to exercise outside of rehab at least 1-2x/week. Phillip Chavez continues to do well in rehab.  Has has increased his workload on the treadmill to 1.7 mph with a 2% incline! He also got his recumbent bike watts up to 20, we hope to see that improve more overtime. We start to introduce resistance bands during their resistance training. We will continue to monitor. Phillip Chavez is doimg well in rehab. He recently increased his overall average MET level to 2.75 METs. He also improved to level 3 on the T4. He has done well with 4 lb hand weights and resistance bands as well. We will continue to monitor his progress in the program. Phillip Chavez improved his post 6MWT by 42%!!  He is planning to continue to walk on his own after graduation and go to [employer].  We will continue to monitor his progress.   Expected Outcomes Short: Try increased speed on the treadmill. Long: Continue to improve strength and stamina. Short: Go to [employer] 1-2x/week. Long: Continue to improve strength and stamina. Short: Increase watts on recumbent bike Long: Continue to increase overall MET level Short: Continue to increase workloads and walk more. Long: continue to increas strength and stamina. Continue to exercise independently            Discharge Exercise Prescription (Final Exercise Prescription Changes):  Exercise Prescription Changes - 11/30/21 1400       Response to Exercise   Blood Pressure (Admit) 118/60    Blood Pressure (Exercise) 138/62    Blood Pressure (Exit) 116/62    Heart Rate (Admit) 57 bpm    Heart Rate (Exercise) 84 bpm    Heart Rate (Exit) 72 bpm    Oxygen Saturation (Admit) 96 %    Oxygen Saturation (Exercise) 93 %    Oxygen Saturation (Exit) 95 %    Rating of Perceived Exertion (Exercise) 13    Perceived Dyspnea (Exercise) 0    Symptoms none    Duration Continue with 30 min of aerobic exercise without signs/symptoms of physical distress.    Intensity THRR unchanged      Progression   Progression Continue to progress workloads to maintain intensity without signs/symptoms of physical  distress.    Average METs 2.42      Resistance Training   Training Prescription Yes    Weight 4 lb    Reps 10-15      Interval Training   Interval Training No      Treadmill   MPH  2.3    Grade 1    Minutes 15    METs 3.08      NuStep   Level 3    Minutes 15    METs 2.2      T5 Nustep   Level 2    Minutes 15    METs 2.2      Biostep-RELP   Level 2    Minutes 15    METs 2      Home Exercise Plan   Plans to continue exercise at [employer] (comment)   [employer]   Frequency Add 2 additional days to program exercise sessions.    Initial Home Exercises Provided 09/21/21      Oxygen   Maintain Oxygen Saturation 88% or higher             Nutrition:  Target Goals: Understanding of nutrition guidelines, daily intake of sodium <1562m, cholesterol <2070m calories 30% from fat and 7% or less from saturated fats, daily to have 5 or more servings of fruits and vegetables.  Education: All About Nutrition: -Group instruction provided by verbal, written material, interactive activities, discussions, models, and posters to present general guidelines for heart healthy nutrition including fat, fiber, MyPlate, the role of sodium in heart healthy nutrition, utilization of the nutrition label, and utilization of this knowledge for meal planning. Follow up email sent as well. Written material given at graduation. [facility]  Education need identified 08/25/21  Date 11/25/21  Educator MC[place]linInstruction Review Code 1- Verbalizes Understanding       Biometrics:  Pre Biometrics - 08/25/21 1159       Pre Biometrics   Height 5' 3.5" (1.613 m)    Weight 155 lb 1.6 oz (70.4 kg)    BMI (Calculated) 27.04    Single Leg Stand 2.59 seconds             Post Biometrics - 11/25/21 1511        Post  Biometrics   Height 5' 3.5" (1.613 m)    Weight 163 lb 12.8 oz (74.3 kg)    BMI (Calculated) 28.56              Nutrition Therapy Plan and Nutrition Goals:  Nutrition Therapy & Goals - 10/05/21 1107       Nutrition Therapy   Diet Heart healhty, low Na, Pulmonary MNT    Drug/Food Interactions Statins/Certain Fruits    Protein (specify units) 85g    Fiber 30 grams    Whole Grain Foods 3 servings    Saturated Fats 16 max. grams    Fruits and Vegetables 8 servings/day    Sodium 2 grams      Personal Nutrition Goals   Nutrition Goal ST: add protein to breakfast, practice MyPlate guidlines for meals LT: Maintain kidney function, create meals with MyPlate structure, meet protein needs.    Comments [age].o. M admitted to cardiac rehab s/p CABG x1. PMHx includes CKD stg 3, secondary hyperparathyroidism, anemia, COPD, GERD, HTN, HLD. Relevant medications includes vit C, vit D3, dulcolax, epoetin, dronabinol, ferrous sulfate, reglan, nepro, omega-3 , omeprazole, miralax, crestor. NoBraveryas on dialyisis, but since his kidney function is improving he has stopped; he reports now his doctors will check in for a follow-up every 6 months. Discussed healthy eating for CKD stg 3. Phillip Chavez his wife are trying to stay away from bread as they feel they eat too  many sandwiches. They go out to eat <2-3x/week, will sometimes get fast food or fish or chicken. He reports using liquid plant oil (canola) and sometimes butter and they limit salt and have tried to cut back. Food recall: Phillip Chavez reports difficulty remembering what he has for lunch and dinner most days as it can vary. B: oatmeal with brown sugar or cheerios or corn fakes sometimes or sometimes gravy with toast L: fast food (whopper) or wife will cook S: will not always snack: sometimes salsa or chocolate pudding D: soup (canned - chicken noodle), wife will cook chicken or pork chop with vegetables. he reports issues swallowing rice as he feels it slows down in his throat, but reports no other issues with anything else; suggested trying to pair rice with  liquid such as sauce to add moisture - if the problem persists, consider limiting rice. Drinks: diet drinks, milk, or sweet tea. However, he will mostly have water; he does not have the nepro as often as he does not like it. Discussed heart healthy as well as suggested including protein with breakfast: peanut butter or eggs for example, encouraged vairety of fruits and vegetables, and provided examples for quick and easy meals as he reports they will cook less (microwavable grains, baked potatoes and sweet potatoes, frozen vegetables, canned chicken, or rotisserie chicken).      Intervention Plan   Intervention Prescribe, educate and counsel regarding individualized specific dietary modifications aiming towards targeted core components such as weight, hypertension, lipid management, diabetes, heart failure and other comorbidities.    Expected Outcomes Short Term Goal: Understand basic principles of dietary content, such as calories, fat, sodium, cholesterol and nutrients.;Short Term Goal: A plan has been developed with personal nutrition goals set during dietitian appointment.;Long Term Goal: Adherence to prescribed nutrition plan.             Nutrition Assessments:  MEDIFICTS Score Key: ?70 Need to make dietary changes  40-70 Heart Healthy Diet ? 40 Therapeutic Level Cholesterol Diet  Flowsheet Row Cardiac Rehab from 12/02/2021 in [facility] Cardiac and Pulmonary Rehab  Picture Your Plate Total Score on Discharge 67      Picture Your Plate Scores: <67 Unhealthy dietary pattern with much room for improvement. 41-50 Dietary pattern unlikely to meet recommendations for good health and room for improvement. 51-60 More healthful dietary pattern, with some room for improvement.  >60 Healthy dietary pattern, although there may be some specific behaviors that could be improved.    Nutrition Goals Re-Evaluation:  Nutrition Goals Re-Evaluation     [place] Name 09/21/21 1343 10/19/21 1344 11/23/21 1341          Goals   Current Weight -- -- 166 lb (75.3 kg)     Nutrition Goal Patient is scheduled for appointmenmt with the RD on 8/1. ST: continue to limit salt, practice MyPlate guidlines for meals LT: Maintain kidney function, create meals with MyPlate structure, meet protein needs. lose some weight. Reduce eating out.     Comment -- Phillip Chavez reports that he is mindful of the salt he is eating as his appetite has improved and he is eating at least 3x/day. him and his wife go out to eat now about 1x/week, will sometimes get fast food or fish or chicken. He reports using liquid plant oil (canola) and sometimes butter and they limit salt and have tried to cut back. B: oatmeal with brown sugar (sometimes will have peanut) or scrambled egg and small glass of milk with OJ L: wife  will cook similar to dinner S: will not always snack: sometimes salsa or chocolate pudding D: soup (canned - chicken noodle), wife will cook chicken or pork chop with vegetables. Drinks: diet drinks, milk, or sweet tea and has been trying to have mostly water. Phillip Chavez states that he would like to keep his weight in check and states that he eats out to much. He is going to try to work on eating at home more.     Expected Outcome Short: Meet with RD Long: Continue to eat heart healthy diet ST: continue to limit salt, practice MyPlate guidlines for meals LT: Maintain kidney function, create meals with MyPlate structure, meet protein needs. Short: eat at home more. Long: seldome eat out.              Nutrition Goals Discharge (Final Nutrition Goals Re-Evaluation):  Nutrition Goals Re-Evaluation - 11/23/21 1341       Goals   Current Weight 166 lb (75.3 kg)    Nutrition Goal lose some weight. Reduce eating out.    Comment Read states that he would like to keep his weight in check and states that he eats out to much. He is going to try to work on eating at home more.    Expected Outcome Short: eat at home more. Long: seldome eat  out.             Psychosocial: Target Goals: Acknowledge presence or absence of significant depression and/or stress, maximize coping skills, provide positive support system. Participant is able to verbalize types and ability to use techniques and skills needed for reducing stress and depression.   Education: Stress, Anxiety, and Depression - Group verbal and visual presentation to define topics covered.  Reviews how body is impacted by stress, anxiety, and depression.  Also discusses healthy ways to reduce stress and to treat/manage anxiety and depression.  Written material given at graduation. Flowsheet Row Cardiac Rehab from 12/02/2021 in [facility] Cardiac and Pulmonary Rehab  Date 10/21/21  Educator NT  Instruction Review Code 1- [employer] Understanding       Education: Sleep Hygiene -Provides group verbal and written instruction about how sleep can affect your health.  Define sleep hygiene, discuss sleep cycles and impact of sleep habits. Review good sleep hygiene tips.    Initial Review & Psychosocial Screening:  Initial Psych Review & Screening - 08/17/21 1516       Initial Review   Current issues with None Identified      Family Dynamics   Good Support System? Yes   wife  son     Barriers   Psychosocial barriers to participate in program There are no identifiable barriers or psychosocial needs.      Screening Interventions   Interventions Encouraged to exercise    Expected Outcomes Short Term goal: Utilizing psychosocial counselor, staff and physician to assist with identification of specific Stressors or current issues interfering with healing process. Setting desired goal for each stressor or current issue identified.;Long Term Goal: Stressors or current issues are controlled or eliminated.;Short Term goal: Identification and review with participant of any Quality of Life or Depression concerns found by scoring the questionnaire.;Long Term goal: The participant improves  quality of Life and PHQ9 Scores as seen by post scores and/or verbalization of changes             Quality of Life Scores:   Quality of Life - 12/02/21 1614       Quality of Life   Select  Quality of Life      Quality of Life Scores   Health/Function Pre 21.77 %    Health/Function Post 22.5 %    Health/Function % Change 3.35 %    Socioeconomic Pre 26.88 %    Socioeconomic Post 24.75 %    Socioeconomic % Change  -7.92 %    Psych/Spiritual Pre 28.43 %    Psych/Spiritual Post 24.43 %    Psych/Spiritual % Change -14.07 %    Family Pre 28.8 %    Family Post 26.4 %    Family % Change -8.33 %    GLOBAL Pre 25.27 %    GLOBAL Post 23.91 %    GLOBAL % Change -5.38 %            Scores of 19 and below usually indicate a poorer quality of life in these areas.  A difference of  2-3 points is a clinically meaningful difference.  A difference of 2-3 points in the total score of the Quality of Life Index has been associated with significant improvement in overall quality of life, self-image, physical symptoms, and general health in studies assessing change in quality of life.  PHQ-9: Review Flowsheet  More data may exist      12/02/2021 09/30/2021 08/25/2021 01/17/2020 11/22/2019  Depression screen PHQ 2/9  Decreased Interest 0 1 2 0 0  Down, Depressed, Hopeless 0 1 0 0 0  PHQ - 2 Score 0 2 2 0 0  Altered sleeping 0 0 0 0 2  Tired, decreased energy '1 1 3 ' 0 1  Change in appetite 0 1 0 2 2  Feeling bad or failure about yourself  0 0 1 0 0  Trouble concentrating 0 0 0 0 0  Moving slowly or fidgety/restless 0 0 0 0 0  Suicidal thoughts 0 0 0 0 0  PHQ-9 Score '1 4 6 2 5  ' Difficult doing work/chores Somewhat difficult Not difficult at all Somewhat difficult Not difficult at all Not difficult at all   Interpretation of Total Score  Total Score Depression Severity:  1-4 = Minimal depression, 5-9 = Mild depression, 10-14 = Moderate depression, 15-19 = Moderately severe depression, 20-27 =  Severe depression   Psychosocial Evaluation and Intervention:   Psychosocial Re-Evaluation:  Psychosocial Re-Evaluation     Row Name 09/21/21 1403 10/19/21 1351 11/23/21 1340         Psychosocial Re-Evaluation   Current issues with Current Stress Concerns Current Stress Concerns None Identified     Comments Phillip Chavez is doing well mentally overall. His son is getting surgery as he has a cyst on throat in a couple months, but states he is not too worried. His wife is also getting testing for multiple health conditions. However, he feels he manages hia stress fairly good. He does not take any medications for depression/anxiety. He is trying to get back into [employer] with his wife again for exercise. He felt he had such a setback with his heart event and is still getting adjusted to recovering. He feels great since starting the program and looks forward to more sessions. Phillip Chavez reports that he does not have much stress, but he feels he is hyperventilating as he feels breathing deeply can feel awkward - encouraged him to practice diaphramatic breathing at rest and use PLB during exercise. He reports he spoke to his MD about this and they found some fluid, but not much; they put him on a diuretic - encouraged him to follow up  with his doctor as he also has COPD - he sees him tomorrow. Patient reports no issues with their current mental states, sleep, stress, depression or anxiety. Will follow up with patient in a few weeks for any changes.     Expected Outcomes Short: Continue routine attendance with rehab Long: Continue to maintain positive attitude Short: continue to exercise for mental health boost, speak with MD about continued shortness of breath Long: Continue to maintain positive attitude Short: Continue to exercise regularly to support mental health and notify staff of any changes. Long: maintain mental health and well being through teaching of rehab or prescribed medications independently.      Interventions Encouraged to attend Cardiac Rehabilitation for the exercise Encouraged to attend Cardiac Rehabilitation for the exercise Encouraged to attend Cardiac Rehabilitation for the exercise     Continue Psychosocial Services  Follow up required by staff Follow up required by staff No Follow up required              Psychosocial Discharge (Final Psychosocial Re-Evaluation):  Psychosocial Re-Evaluation - 11/23/21 1340       Psychosocial Re-Evaluation   Current issues with None Identified    Comments Patient reports no issues with their current mental states, sleep, stress, depression or anxiety. Will follow up with patient in a few weeks for any changes.    Expected Outcomes Short: Continue to exercise regularly to support mental health and notify staff of any changes. Long: maintain mental health and well being through teaching of rehab or prescribed medications independently.    Interventions Encouraged to attend Cardiac Rehabilitation for the exercise    Continue Psychosocial Services  No Follow up required             Vocational Rehabilitation: Provide vocational rehab assistance to qualifying candidates.   Vocational Rehab Evaluation & Intervention:   Education: Education Goals: Education classes will be provided on a variety of topics geared toward better understanding of heart health and risk factor modification. Participant will state understanding/return demonstration of topics presented as noted by education test scores.  Learning Barriers/Preferences:   General Cardiac Education Topics:  AED/CPR: - Group verbal and written instruction with the use of models to demonstrate the basic use of the AED with the basic ABC's of resuscitation.   Anatomy and Cardiac Procedures: - Group verbal and visual presentation and models provide information about basic cardiac anatomy and function. Reviews the testing methods done to diagnose heart disease and the outcomes of  the test results. Describes the treatment choices: Medical Management, Angioplasty, or Coronary Bypass Surgery for treating various heart conditions including Myocardial Infarction, Angina, Valve Disease, and Cardiac Arrhythmias.  Written material given at graduation. Flowsheet Row Cardiac Rehab from 12/02/2021 in [facility] Cardiac and Pulmonary Rehab  Date 11/18/21  Educator SB  Instruction Review Code 1- Verbalizes Understanding       Medication Safety: - Group verbal and visual instruction to review commonly prescribed medications for heart and lung disease. Reviews the medication, class of the drug, and side effects. Includes the steps to properly store meds and maintain the prescription regimen.  Written material given at graduation. Flowsheet Row Cardiac Rehab from 12/02/2021 in [facility] Cardiac and Pulmonary Rehab  Date 09/30/21  Educator SB  Instruction Review Code 1- Verbalizes Understanding       Intimacy: - Group verbal instruction through game format to discuss how heart and lung disease can affect sexual intimacy. Written material given at graduation.. Flowsheet Row Cardiac Rehab from 12/02/2021  in [facility] Cardiac and Pulmonary Rehab  Date 09/02/21  Educator [place]  Instruction Review Code 1- Verbalizes Understanding       Know Your Numbers and Heart Failure: - Group verbal and visual instruction to discuss disease risk factors for cardiac and pulmonary disease and treatment options.  Reviews associated critical values for Overweight/Obesity, Hypertension, Cholesterol, and Diabetes.  Discusses basics of heart failure: signs/symptoms and treatments.  Introduces Heart Failure Zone chart for action plan for heart failure.  Written material given at graduation. Flowsheet Row Cardiac Rehab from 12/02/2021 in [facility] Cardiac and Pulmonary Rehab  Date 10/07/21  Educator SB  Instruction Review Code 1- Verbalizes Understanding       Infection Prevention: - Provides verbal and written material to  individual with discussion of infection control including proper hand washing and proper equipment cleaning during exercise session. Flowsheet Row Cardiac Rehab from 12/02/2021 in [facility] Cardiac and Pulmonary Rehab  Education need identified 08/25/21  Date 08/25/21  Educator [place]  Instruction Review Code 1- Verbalizes Understanding       Falls Prevention: - Provides verbal and written material to individual with discussion of falls prevention and safety. Flowsheet Row Cardiac Rehab from 12/02/2021 in [facility] Cardiac and Pulmonary Rehab  Education need identified 08/25/21  Date 08/25/21  Educator [place]  Instruction Review Code 1- Verbalizes Understanding       Other: -Provides group and verbal instruction on various topics (see comments)   Knowledge Questionnaire Score:  Knowledge Questionnaire Score - 12/02/21 1614       Knowledge Questionnaire Score   Post Score 25/26             Core Components/Risk Factors/Patient Goals at Admission:  Personal Goals and Risk Factors at Admission - 08/25/21 1427       Core Components/Risk Factors/Patient Goals on Admission    Weight Management Yes;Weight Maintenance    Intervention Weight Management: Develop a combined nutrition and exercise program designed to reach desired caloric intake, while maintaining appropriate intake of nutrient and fiber, sodium and fats, and appropriate energy expenditure required for the weight goal.;Weight Management: Provide education and appropriate resources to help participant work on and attain dietary goals.;Weight Management/Obesity: Establish reasonable short term and long term weight goals.    Admit Weight 155 lb (70.3 kg)    Goal Weight: Short Term 155 lb (70.3 kg)    Goal Weight: Long Term 155 lb (70.3 kg)    Expected Outcomes Short Term: Continue to assess and modify interventions until short term weight is achieved;Weight Maintenance: Understanding of the daily nutrition guidelines, which includes  25-35% calories from fat, 7% or less cal from saturated fats, less than 262m cholesterol, less than 1.5gm of sodium, & 5 or more servings of fruits and vegetables daily;Understanding recommendations for meals to include 15-35% energy as protein, 25-35% energy from fat, 35-60% energy from carbohydrates, less than 2075mof dietary cholesterol, 20-35 gm of total fiber daily;Understanding of distribution of calorie intake throughout the day with the consumption of 4-5 meals/snacks    Hypertension Yes    Intervention Provide education on lifestyle modifcations including regular physical activity/exercise, weight management, moderate sodium restriction and increased consumption of fresh fruit, vegetables, and low fat dairy, alcohol moderation, and smoking cessation.;Monitor prescription use compliance.    Expected Outcomes Short Term: Continued assessment and intervention until BP is < 140/901mG in hypertensive participants. < 130/24m58m in hypertensive participants with diabetes, heart failure or chronic kidney disease.;Long Term: Maintenance of blood pressure at goal  levels.    Lipids Yes    Intervention Provide education and support for participant on nutrition & aerobic/resistive exercise along with prescribed medications to achieve LDL <68m, HDL >469m    Expected Outcomes Short Term: Participant states understanding of desired cholesterol values and is compliant with medications prescribed. Participant is following exercise prescription and nutrition guidelines.;Long Term: Cholesterol controlled with medications as prescribed, with individualized exercise RX and with personalized nutrition plan. Value goals: LDL < 7068mHDL > 40 mg.             Education:Diabetes - Individual verbal and written instruction to review signs/symptoms of diabetes, desired ranges of glucose level fasting, after meals and with exercise. Acknowledge that pre and post exercise glucose checks will be done for 3 sessions  at entry of program.   Core Components/Risk Factors/Patient Goals Review:   Goals and Risk Factor Review     Row Name 09/21/21 1401 10/19/21 1334 11/23/21 1343         Core Components/Risk Factors/Patient Goals Review   Personal Goals Review Weight Management/Obesity;Hypertension Weight Management/Obesity;Hypertension Weight Management/Obesity;Hypertension     Review Phillip Chavez is doing well. He is maintaining his  weight around 150 lb. He is trying to maintain, if not gain, as he used to be 180 lb. We talked about making sure he is maintain appropriate muscle mass and to go off by the feeling of his  clothing. BP is checked at home and he typically runs around  120/60s. He checks it almost with the help of his wife. He is aware to check if ever feels symptomatic. He is staying compliant with his medications. Phillip Chavez his weight has been increasing- today 158.7 lbs; he reports his appetite is improving and he is now eating at least 3x/day, he is trying to monitor his salt to not retain fluid. He has a BP cuff at home and he will check it everyday; running 120/60s - today it was 122/64 at rehab. He has a watch to track his HR; reviewed THR during exercise. Phillip Chavez that he feels he is hyperventilating as he feels breathing deeply can feel awkward - encouraged him to practice diaphramatic breathing at rest and use PLB during exercise. He reports he spoke to his MD about this and they found some fluid, but not much; they put him on a diuretic - encouraged him to follow up with his doctor as he also has COPD - he sees him tomorrow. Phillip Chavez checking his blood pressure at home. He is doing well in thr program and wants to continue to reduce some weight. He is about to graduate from the program and is going to continue to exercise at [employer]   Expected Outcomes Short: Continue to monitor weight Long: Continue to manage lifestyle risk factors Short: Continue to monitor weight Long: Continue  to manage lifestyle risk factors Short: Graduate [facility]. Long: maintain exercise post [facility].              Core Components/Risk Factors/Patient Goals at Discharge (Final Review):   Goals and Risk Factor Review - 11/23/21 1343       Core Components/Risk Factors/Patient Goals Review   Personal Goals Review Weight Management/Obesity;Hypertension    Review Phillip Chavez checking his blood pressure at home. He is doing well in thr program and wants to continue to reduce some weight. He is about to graduate from the program and is going to continue to exercise at [employer]  Expected  Outcomes Short: Graduate Phillip Chavez. Long: maintain exercise post [facility].             ITP Comments:  ITP Comments     Row Name 08/17/21 1535 08/25/21 0937 09/02/21 1332 09/09/21 0732 10/05/21 1200   ITP Comments Virtual orientation call completed today. he has an appointment on Date: 08/25/2021  for EP eval and gym Orientation.  Documentation of diagnosis can be found in [facility] Date: 02/20/2021 . Completed [facility] and gym orientation. Initial ITP created and sent for review to Dr. Emily Filbert, Medical Director. First full day of exercise!  Patient was oriented to gym and equipment including functions, settings, policies, and procedures.  Patient's individual exercise prescription and treatment plan were reviewed.  All starting workloads were established based on the results of the 6 minute walk test done at initial orientation visit.  The plan for exercise progression was also introduced and progression will be customized based on patient's performance and goals. 30 Day review completed. Medical Director ITP review done, changes made as directed, and signed approval by Medical Director.   New Completed initial RD consultation    Row Name 10/07/21 [phone] 12/02/21 0814 12/09/21 1346       ITP Comments 30 Day review completed. Medical Director ITP review done, changes made as directed, and signed approval  by Medical Director. 30 Day review completed. Medical Director ITP review done, changes made as directed, and signed approval by Medical Director. Phillip Chavez graduated today from  rehab with 36 sessions completed.  Details of the patient's exercise prescription and what He needs to do in order to continue the prescription and progress were discussed with patient.  Patient was given a copy of prescription and goals.  Patient verbalized understanding.  Phillip Chavez plans to continue to exercise by walking.              Comments: Discharge ITP

## 2022-08-12 ENCOUNTER — Ambulatory Visit
Admission: EM | Admit: 2022-08-12 | Discharge: 2022-08-12 | Disposition: A | Discharge status: Home / Self Care | Payer: No Typology Code available for payment source | Attending: Physician Assistant | Admitting: Physician Assistant

## 2022-08-12 ENCOUNTER — Ambulatory Visit: Payer: No Typology Code available for payment source

## 2022-08-12 DIAGNOSIS — U071 COVID-19: Secondary | ICD-10-CM

## 2022-08-12 DIAGNOSIS — Z8709 Personal history of other diseases of the respiratory system: Secondary | ICD-10-CM

## 2022-08-12 DIAGNOSIS — R051 Acute cough: Secondary | ICD-10-CM

## 2022-08-12 DIAGNOSIS — N183 Chronic kidney disease, stage 3 unspecified: Secondary | ICD-10-CM | POA: Diagnosis present

## 2022-08-12 LAB — SARS CORONAVIRUS 2 BY RT PCR: SARS Coronavirus 2 by RT PCR: POSITIVE — AB

## 2022-08-12 MED ORDER — BENZONATATE 100 MG PO CAPS: 200.0000 mg | ORAL_CAPSULE | Freq: Three times a day (TID) | ORAL | 0 refills | Status: AC

## 2022-08-12 MED ORDER — BENZONATATE 100 MG PO CAPS: 200.0000 mg | ORAL_CAPSULE | Freq: Three times a day (TID) | ORAL | 0 refills | Status: DC

## 2022-08-12 MED ORDER — NIRMATRELVIR/RITONAVIR (PAXLOVID) TABLET (RENAL DOSING): ORAL_TABLET | ORAL | 0 refills | Status: AC

## 2022-08-12 MED ORDER — NIRMATRELVIR/RITONAVIR (PAXLOVID) TABLET (RENAL DOSING): ORAL_TABLET | ORAL | 0 refills | Status: DC

## 2022-08-12 NOTE — ED Triage Notes (Signed)
Pt c/o nasal congestion & cough x3 days. Denies any fevers. States he tested + for covid at home today.

## 2022-08-12 NOTE — Discharge Instructions (Addendum)
-  You do not have pneumonia. -Positive COVID test.  Start Paxlovid.  He will need to wait until least 12 hours after your last dose of the rosuvastatin before you start the Paxlovid and then do not take it the entire 5 days you are taking Paxlovid and hold it for 5 more days after he finished the medicine. - There is a potential medication interaction with Marinol.  Just monitor for any increased heart rate, dizziness, fatigue or weakness.  If you experience the symptoms go to emergency department.  This is relatively low risk with something to look out for. - Increase your rest and fluids.  Start taking plain Mucinex over-the-counter.  I sent benzonatate to the pharmacy to help with cough. - Use your inhalers as advised. - If you have any uncontrollable fever, weakness or breathing difficulty not relieved by use of your inhalers please go to emergency department for evaluation immediately.

## 2022-08-12 NOTE — ED Provider Notes (Signed)
[facility]    CSN: [id number] Arrival date & time: 08/12/22  1747      History   Chief Complaint Chief Complaint  Patient presents with   Covid Positive   Cough   Nasal Congestion    HPI Phillip Chavez is a 82 y.o. male with history of COPD, chronic CHF, hypertension, hyperlipidemia, presence of pacemaker, CKD stage III, anemia presenting for evaluation after testing positive for COVID with a home test today.  He reports 3-day history of fatigue, cough and congestion.  Also reports sinus pressure, postnasal drainage and mild sore throat.  He denies fever, chest pain, shortness of breath or wheezing.  No increase of breathing difficulty from baseline.  He states that he has had all of the COVID vaccines except for the last booster.  No history of COVID-19.  He has been using a nasal saline rinse but not taking any other medications.  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   COVID-19 virus infection 01/14/2021   Aortic valve regurgitation    CAP (community acquired pneumonia) 01/03/2021   Sepsis (HCC) 01/03/2021   COPD (chronic obstructive pulmonary disease) (HCC) 01/03/2021   GERD (gastroesophageal reflux disease) 01/03/2021   HTN (hypertension)    HLD (hyperlipidemia)    Acute respiratory failure with hypoxia (HCC)    Elevated troponin    AKI (acute kidney injury) (HCC)    Normocytic anemia     Past Surgical History:  Procedure Laterality Date   AORTIC VALVE REPLACEMENT  01/2021   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   [facility]   dental procedure     DIALYSIS/PERMA CATHETER INSERTION N/A 01/20/2021   Procedure: DIALYSIS/PERMA CATHETER INSERTION;  Surgeon: Annice Needy, MD;  Location: [facility];  Service: Cardiovascular;  Laterality: N/A;   DIALYSIS/PERMA CATHETER REMOVAL N/A 06/11/2021   Procedure: DIALYSIS/PERMA CATHETER REMOVAL;  Surgeon: Annice Needy, MD;  Location: [facility];  Service: Cardiovascular;  Laterality: N/A;   TEE WITHOUT CARDIOVERSION N/A 01/06/2021   Procedure: TRANSESOPHAGEAL ECHOCARDIOGRAM (TEE);  Surgeon: Antonieta Iba, MD;  Location: [facility] ORS;  Service: Cardiovascular;  Laterality: N/A;       Home Medications    Prior to Admission medications   Medication Sig Start Date End Date Taking? Authorizing Provider  albuterol (VENTOLIN HFA) 108 (90 Base) MCG/ACT inhaler Inhale 2 puffs into the lungs every 6 (six) hours as needed for wheezing or shortness of breath.   Yes [provider]  ascorbic acid (VITAMIN C) 500 MG tablet Take by mouth.   Yes [provider]  benzonatate (TESSALON) 100 MG capsule Take 2 capsules (200 mg total) by mouth every 8 (eight) hours. 08/12/22  Yes Shirlee Latch, PA-C  cholecalciferol (VITAMIN D3) 25 MCG (1000 UNIT) tablet Take 1,000 Units by mouth daily. 03/08/13  Yes [provider]  esomeprazole (NEXIUM) 20 MG capsule Take 1 tablet by mouth daily. 06/22/22  Yes [provider]  ferrous sulfate 325 (65 FE) MG tablet Take 1 tablet (325 mg total) by mouth daily with breakfast. 01/24/21  Yes Darlin Priestly, MD  fluticasone (FLONASE) 50 MCG/ACT nasal spray INSTILL 1 SPRAY INTO EACH NOSTRIL EVERY DAY MAXIMUM 2 SPRAYS IN EACH NOSTRIL DAILY. FOR ALLERGIES 06/02/21  Yes [provider]  fluticasone furoate-vilanterol (BREO ELLIPTA) 200-25 MCG/ACT AEPB Inhale 1 puff into the lungs daily. 02/09/21  Yes Darlin Priestly, MD  ketotifen (ZADITOR) 0.025 %  ophthalmic solution 1 drop 2 (two) times daily.   Yes [provider]  losartan (COZAAR) 25 MG tablet TAKE ONE TABLET BY MOUTH TWO TIMES A DAY FOR BLOOD PRESSURE 07/27/21  Yes [provider]  metoprolol tartrate (LOPRESSOR) 25 MG tablet TAKE ONE-HALF TABLET BY MOUTH TWO TIMES A DAY FOR HIGH BLOOD PRESSURE 03/20/21  Yes [provider]  nirmatrelvir/ritonavir, renal dosing, (PAXLOVID) 10 x 150 MG & 10 x 100MG  TABS Patient GFR is 55. Take nirmatrelvir (150 mg) one tablet twice daily for 5 days and ritonavir (100 mg) one tablet twice daily for 5 days. 08/12/22  Yes Eusebio Friendly B, PA-C  Olodaterol HCl 2.5 MCG/ACT AERS Take by mouth. 06/03/22  Yes [provider]  Omega-3 Fatty Acids (FISH OIL) 1000 MG CAPS TAKE 3 CAPSULES (SEA-OMEGA 30 FISH OIL) BY MOUTH TWO TIMES A DAY FOR CHOLESTEROL 01/08/20  Yes [provider]  pantoprazole (PROTONIX) 40 MG tablet 1 tablet daily. 04/18/20  Yes [provider]  polyethylene glycol (MIRALAX / GLYCOLAX) 17 g packet Take 17 g by mouth 2 (two) times daily. 01/24/21  Yes Darlin Priestly, MD  rosuvastatin (CRESTOR) 40 MG tablet Take 40 mg by mouth daily.   Yes [provider]  bisacodyl (DULCOLAX) 10 MG suppository Place 10 mg rectally as needed for moderate constipation. Patient not taking: Reported on 05/26/2021    [provider]  calamine lotion Apply 1 application topically as needed for itching. Patient not taking: Reported on 05/26/2021    [provider]  dronabinol (MARINOL) 5 MG capsule Take 5 mg by mouth 2 (two) times daily before a meal. Patient not taking: Reported on 08/17/2021    [provider]  epoetin alfa (EPOGEN) 10000 UNIT/ML injection Inject 1 mL (10,000 Units total) into the vein every Monday, Wednesday, and Friday with hemodialysis. Patient not taking: Reported on 05/26/2021 02/09/21   Darlin Priestly, MD  metoCLOPramide (REGLAN) 5 MG tablet Take 5 mg by mouth 4 (four) times  daily -  before meals and at bedtime. Patient not taking: Reported on 05/26/2021    [provider]  Nutritional Supplements (FEEDING SUPPLEMENT, NEPRO CARB STEADY,) LIQD Take 237 mLs by mouth 3 (three) times daily between meals. Patient not taking: Reported on 08/17/2021 01/24/21   Darlin Priestly, MD  nystatin (MYCOSTATIN/NYSTOP) powder Apply 1 application topically 2 (two) times daily. Patient not taking: Reported on 05/26/2021    [provider]  omega-3 acid ethyl esters (LOVAZA) 1 g capsule Take 3 g by mouth 2 (two) times daily. Patient not taking: Reported on 08/17/2021    [provider]  tamsulosin (FLOMAX) 0.4 MG CAPS capsule Take 1 capsule (0.4 mg total) by mouth daily. Patient not taking: Reported on 05/26/2021 02/09/21   Darlin Priestly, MD    Family History Family History  Problem Relation Age of Onset   Prostate cancer Father    Prostate cancer Brother    Pancreatic cancer Brother     Social History Social History   Tobacco Use  Smoking status: Former    Packs/day: 1.00    Years: 20.00    Additional pack years: 0.00    Total pack years: 20.00    Types: Cigarettes    Quit date: 03/21/1978    Years since quitting: 44.4   Smokeless tobacco: Never  Vaping Use   Vaping Use: Never used  Substance Use Topics   Alcohol use: Not Currently   Drug use: Never     Allergies   Aspirin-dipyridamole er, Atorvastatin, Azithromycin, Cefpodoxime, Ciprofloxacin, Claritin [loratadine], Contrast media [iodinated contrast media], Doxazosin, Doxycycline, Equal [aspartame], Fenofibrate, Flomax [tamsulosin], Flunisolide, Fluticasone-salmeterol, Fluvastatin, Gemfibrozil, Haemophilus influenzae vaccines, Hydrochlorothiazide, Levofloxacin, Lisinopril, Mirtazapine, Moxifloxacin, Niacin and related, Oxycodone, Plavix [clopidogrel], Pravastatin, Simvastatin, Terazosin, Zetia [ezetimibe], Amoxicillin, Mometasone, Shrimp extract, and Sulfa antibiotics   Review of Systems Review  of Systems  Constitutional:  Positive for fatigue. Negative for fever.  HENT:  Positive for congestion, postnasal drip, rhinorrhea, sinus pressure and sore throat. Negative for sinus pain.   Respiratory:  Positive for cough. Negative for shortness of breath and wheezing.   Cardiovascular:  Negative for chest pain.  Gastrointestinal:  Negative for abdominal pain, diarrhea, nausea and vomiting.  Musculoskeletal:  Negative for myalgias.  Neurological:  Negative for weakness, light-headedness and headaches.  Hematological:  Negative for adenopathy.     Physical Exam Triage Vital Signs ED Triage Vitals  Enc Vitals Group     BP      Pulse      Resp      Temp      Temp src      SpO2      Weight      Height      Head Circumference      Peak Flow      Pain Score      Pain Loc      Pain Edu?      Excl. in GC?    No data found.  Updated Vital Signs BP 128/75 (BP Location: Left Arm)   Pulse 82   Temp 98.4 F (36.9 C) (Oral)   Resp 16   Ht 5\' 4"  (1.626 m)   Wt 174 lb (78.9 kg)   SpO2 94%   BMI 29.87 kg/m    Physical Exam Vitals and nursing note reviewed.  Constitutional:      General: He is not in acute distress.    Appearance: Normal appearance. He is well-developed. He is not ill-appearing.  HENT:     Head: Normocephalic and atraumatic.     Nose: Congestion present.     Mouth/Throat:     Mouth: Mucous membranes are moist.     Pharynx: Oropharynx is clear. Posterior oropharyngeal erythema present.  Eyes:     General: No scleral icterus.    Conjunctiva/sclera: Conjunctivae normal.  Cardiovascular:     Rate and Rhythm: Normal rate and regular rhythm.  Pulmonary:     Effort: Pulmonary effort is normal. No respiratory distress.     Breath sounds: Normal breath sounds. No wheezing, rhonchi or rales.  Musculoskeletal:     Cervical back: Neck supple.  Skin:    General: Skin is warm and dry.     Capillary Refill: Capillary refill takes less than 2 seconds.   Neurological:     General: No focal deficit present.     Mental Status: He is alert. Mental status is at baseline.     Motor: No weakness.     Coordination: Coordination normal.     Gait:  Gait normal.  Psychiatric:        Mood and Affect: Mood normal.        Behavior: Behavior normal.      UC Treatments / Results  Labs (all labs ordered are listed, but only abnormal results are displayed) Labs Reviewed  SARS CORONAVIRUS 2 BY RT PCR - Abnormal; Notable for the following components:      Result Value   SARS Coronavirus 2 by RT PCR POSITIVE (*)    All other components within normal limits    EKG   Radiology DG Chest 2 View  Result Date: 08/12/2022 CLINICAL DATA:  Nasal congestion and cough x3 days. Patient tested positive for COVID at home today. EXAM: CHEST - 2 VIEW COMPARISON:  February 05, 2021 FINDINGS: Multiple sternal wires are seen. This represents a new finding when compared to the prior study. The heart size and mediastinal contours are within normal limits. An artificial aortic valve is noted. A radiopaque atrial occlusion clip is seen. Mild atelectasis is seen within the left lung base. There is a small left pleural effusion. No pneumothorax is identified. A chronic compression fracture deformity is seen within the visualized portion of the upper lumbar spine. IMPRESSION: 1. Evidence of prior median sternotomy and artificial aortic valve placement. 2. Mild left basilar atelectasis with a small left pleural effusion. Electronically Signed   By: Aram Candela M.D.   On: 08/12/2022 18:47    Procedures Procedures (including critical care time)  Medications Ordered in UC Medications - No data to display  Initial Impression / Assessment and Plan / UC Course  I have reviewed the triage vital signs and the nursing notes.  Pertinent labs & imaging results that were available during my care of the patient were reviewed by me and considered in my medical decision making (see  chart for details).   82 year old male with history of chronic CHF, COPD, CKD stage III, hypertension, hyperlipidemia and anemia presents for cough and congestion for 3 days.  Patient reports testing positive for COVID with a home test.  Vitals are all normal and stable and the patient is overall well-appearing.  No acute distress.  On exam he has nasal congestion without drainage, erythema posterior pharynx.  Chest clear to auscultation heart regular rate and rhythm.  PCR COVID test is positive.  Chest x-ray obtained. Negative for pneumonia.  Reviewed results with patient.  Reviewed current CDC guidelines, isolation protocol and ED precautions.  Patient's last GFR was checked 4 months ago and it was 55.  Potential medication interactions of Paxlovid with rosuvastatin.  Will have him hold this medication for 10 days.  Additional potential interaction with Marinol.  Will have him monitor for tachycardia, dizziness, fatigue or weakness and go to emergency department if experiencing those symptoms.  Will treat cough with benzonatate and plain Mucinex.  Encouraged him to increase rest and fluids and continue using his inhalers.  Supportive care advised.  Reviewed ED precautions thoroughly with patient.   Final Clinical Impressions(s) / UC Diagnoses   Final diagnoses:  COVID-19  Acute cough  History of COPD  Stage 3 chronic kidney disease, unspecified whether stage 3a or 3b CKD (HCC)     Discharge Instructions      -You do not have pneumonia. -Positive COVID test.  Start Paxlovid.  He will need to wait until least 12 hours after your last dose of the rosuvastatin before you start the Paxlovid and then do not take it the entire 5 days  you are taking Paxlovid and hold it for 5 more days after he finished the medicine. - There is a potential medication interaction with Marinol.  Just monitor for any increased heart rate, dizziness, fatigue or weakness.  If you experience the symptoms go to  emergency department.  This is relatively low risk with something to look out for. - Increase your rest and fluids.  Start taking plain Mucinex over-the-counter.  I sent benzonatate to the pharmacy to help with cough. - Use your inhalers as advised. - If you have any uncontrollable fever, weakness or breathing difficulty not relieved by use of your inhalers please go to emergency department for evaluation immediately.     ED Prescriptions     Medication Sig Dispense Auth. Provider   benzonatate (TESSALON) 100 MG capsule Take 2 capsules (200 mg total) by mouth every 8 (eight) hours. 30 capsule Eusebio Friendly B, PA-C   nirmatrelvir/ritonavir, renal dosing, (PAXLOVID) 10 x 150 MG & 10 x 100MG  TABS Patient GFR is 55. Take nirmatrelvir (150 mg) one tablet twice daily for 5 days and ritonavir (100 mg) one tablet twice daily for 5 days. 20 tablet Gareth Morgan      PDMP not reviewed this encounter.   Shirlee Latch, PA-C 08/12/22 1914

## 2022-10-26 ENCOUNTER — Ambulatory Visit
Admission: EM | Admit: 2022-10-26 | Discharge: 2022-10-26 | Disposition: A | Discharge status: Home / Self Care | Payer: No Typology Code available for payment source | Attending: Physician Assistant | Admitting: Physician Assistant

## 2022-10-26 ENCOUNTER — Encounter: Payer: Self-pay | Admitting: Emergency Medicine

## 2022-10-26 DIAGNOSIS — R21 Rash and other nonspecific skin eruption: Secondary | ICD-10-CM

## 2022-10-26 MED ORDER — CLOTRIMAZOLE-BETAMETHASONE 1-0.05 % EX CREA: TOPICAL_CREAM | CUTANEOUS | 0 refills | Status: AC

## 2022-10-26 NOTE — ED Provider Notes (Signed)
[facility]    CSN: [id number] Arrival date & time: 10/26/22  1627      History   Chief Complaint Chief Complaint  Patient presents with   Rash    HPI Phillip Chavez is a 82 y.o. male with history of COPD, chronic CHF, hypertension, hyperlipidemia, presence of pacemaker, CKD stage III, anemia presenting for evaluation itchy dry rash of bilateral axilla x 1 month.  He says he had something similar when he used a deodorant.  He states he no longer uses deodorant and does not know what could have caused the rash this time.  He states that he has well water so he goes to wash his clothes at a laundromat.  He denies any changes to detergents.  No change in body washes.  Patient has tried hydrocortisone cream and alcohol as well as a zinc oxide ointment all without relief.  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   COVID-19 virus infection 01/14/2021   Aortic valve regurgitation    CAP (community acquired pneumonia) 01/03/2021   Sepsis (HCC) 01/03/2021   COPD (chronic obstructive pulmonary disease) (HCC) 01/03/2021   GERD (gastroesophageal reflux disease) 01/03/2021   HTN (hypertension)    HLD (hyperlipidemia)    Acute respiratory failure with hypoxia (HCC)    Elevated troponin    AKI (acute kidney injury) (HCC)    Normocytic anemia     Past Surgical History:  Procedure Laterality Date   AORTIC VALVE REPLACEMENT  01/2021   AORTIC VALVE REPLACEMENT (AVR)/CORONARY ARTERY BYPASS GRAFTING (CABG)  01/2021   [facility]    dental procedure     DIALYSIS/PERMA CATHETER INSERTION N/A 01/20/2021   Procedure: DIALYSIS/PERMA CATHETER INSERTION;  Surgeon: Annice Needy, MD;  Location: [facility];  Service: Cardiovascular;  Laterality: N/A;   DIALYSIS/PERMA CATHETER REMOVAL N/A 06/11/2021   Procedure: DIALYSIS/PERMA CATHETER REMOVAL;  Surgeon: Annice Needy, MD;  Location: [facility];  Service: Cardiovascular;  Laterality: N/A;   TEE WITHOUT CARDIOVERSION N/A 01/06/2021   Procedure: TRANSESOPHAGEAL ECHOCARDIOGRAM (TEE);  Surgeon: Antonieta Iba, MD;  Location: [facility];  Service: Cardiovascular;  Laterality: N/A;       Home Medications    Prior to Admission medications   Medication Sig Start Date End Date Taking? Authorizing Provider  clotrimazole-betamethasone (LOTRISONE) cream Apply to affected area 2 times daily prn x 7-14 days 10/26/22  Yes Eusebio Friendly B, PA-C  albuterol (VENTOLIN HFA) 108 (90 Base) MCG/ACT inhaler Inhale 2 puffs into the lungs every 6 (six) hours as needed for wheezing or shortness of breath.    [provider]  ascorbic acid (VITAMIN C) 500 MG tablet Take by mouth.    [provider]  benzonatate (TESSALON) 100 MG capsule Take 2 capsules (200 mg total) by mouth every 8 (eight) hours. 08/12/22   Shirlee Latch, PA-C  bisacodyl (DULCOLAX) 10 MG suppository Place 10 mg rectally as needed for moderate constipation. Patient not taking: Reported on 05/26/2021  [provider]  calamine lotion Apply 1 application topically as needed for itching. Patient not taking: Reported on 05/26/2021    [provider]  cholecalciferol (VITAMIN D3) 25 MCG (1000 UNIT) tablet Take 1,000 Units by mouth daily. 03/08/13   [provider]  dronabinol (MARINOL) 5 MG capsule Take 5 mg by mouth 2 (two) times daily before a meal. Patient not taking: Reported on 08/17/2021    [provider]  epoetin alfa (EPOGEN) 10000 UNIT/ML injection Inject 1 mL  (10,000 Units total) into the vein every Monday, Wednesday, and Friday with hemodialysis. Patient not taking: Reported on 05/26/2021 02/09/21   Darlin Priestly, MD  esomeprazole (NEXIUM) 20 MG capsule Take 1 tablet by mouth daily. 06/22/22   [provider]  ferrous sulfate 325 (65 FE) MG tablet Take 1 tablet (325 mg total) by mouth daily with breakfast. 01/24/21   Darlin Priestly, MD  fluticasone (FLONASE) 50 MCG/ACT nasal spray INSTILL 1 SPRAY INTO EACH NOSTRIL EVERY DAY MAXIMUM 2 SPRAYS IN EACH NOSTRIL DAILY. FOR ALLERGIES 06/02/21   [provider]  fluticasone furoate-vilanterol (BREO ELLIPTA) 200-25 MCG/ACT AEPB Inhale 1 puff into the lungs daily. 02/09/21   Darlin Priestly, MD  ketotifen (ZADITOR) 0.025 % ophthalmic solution 1 drop 2 (two) times daily.    [provider]  losartan (COZAAR) 25 MG tablet TAKE ONE TABLET BY MOUTH TWO TIMES A DAY FOR BLOOD PRESSURE 07/27/21   [provider]  metoCLOPramide (REGLAN) 5 MG tablet Take 5 mg by mouth 4 (four) times daily -  before meals and at bedtime. Patient not taking: Reported on 05/26/2021    [provider]  metoprolol tartrate (LOPRESSOR) 25 MG tablet TAKE ONE-HALF TABLET BY MOUTH TWO TIMES A DAY FOR HIGH BLOOD PRESSURE 03/20/21   [provider]  nirmatrelvir/ritonavir, renal dosing, (PAXLOVID) 10 x 150 MG & 10 x 100MG  TABS Patient GFR is 55. Take nirmatrelvir (150 mg) one tablet twice daily for 5 days and ritonavir (100 mg) one tablet twice daily for 5 days. 08/12/22   Shirlee Latch, PA-C  Nutritional Supplements (FEEDING SUPPLEMENT, NEPRO CARB STEADY,) LIQD Take 237 mLs by mouth 3 (three) times daily between meals. Patient not taking: Reported on 08/17/2021 01/24/21   Darlin Priestly, MD  nystatin (MYCOSTATIN/NYSTOP) powder Apply 1 application topically 2 (two) times daily. Patient not taking: Reported on 05/26/2021    [provider]  Olodaterol HCl 2.5 MCG/ACT AERS Take by mouth. 06/03/22   [provider]  omega-3 acid ethyl esters (LOVAZA) 1 g capsule Take 3 g by mouth 2 (two) times daily. Patient not taking: Reported on 08/17/2021    [provider]  Omega-3 Fatty Acids (FISH OIL) 1000 MG CAPS TAKE 3 CAPSULES (SEA-OMEGA 30 FISH OIL) BY MOUTH TWO TIMES A DAY FOR CHOLESTEROL 01/08/20   [provider]  pantoprazole (PROTONIX) 40 MG tablet 1 tablet daily. 04/18/20   [provider]  polyethylene glycol (MIRALAX / GLYCOLAX) 17 g packet Take 17 g by mouth 2 (two) times daily. 01/24/21   Darlin Priestly, MD  rosuvastatin (CRESTOR) 40 MG tablet Take 40 mg by mouth daily.    [provider]  tamsulosin (FLOMAX) 0.4 MG CAPS capsule Take 1 capsule (0.4 mg total) by mouth daily. Patient not taking: Reported on 05/26/2021 02/09/21   Darlin Priestly, MD    Family History Family History  Problem Relation Age of Onset   Prostate cancer Father    Prostate cancer Brother  Pancreatic cancer Brother     Social History Social History   Tobacco Use   Smoking status: Former    Current packs/day: 0.00    Average packs/day: 1 pack/day for 20.0 years (20.0 ttl pk-yrs)    Types: Cigarettes    Start date: 03/21/1958    Quit date: 03/21/1978    Years since quitting: 44.6   Smokeless tobacco: Never  Vaping Use   Vaping status: Never Used  Substance Use Topics   Alcohol use: Not Currently   Drug use: Never     Allergies   Aspirin-dipyridamole er, Atorvastatin, Azithromycin, Cefpodoxime, Ciprofloxacin, Claritin [loratadine], Contrast media [iodinated contrast media], Doxazosin, Doxycycline, Equal [aspartame], Fenofibrate, Flomax [tamsulosin], Flunisolide, Fluticasone-salmeterol, Fluvastatin, Gemfibrozil, Haemophilus influenzae vaccines, Hydrochlorothiazide, Levofloxacin, Lisinopril, Mirtazapine, Moxifloxacin, Niacin and related, Oxycodone, Plavix [clopidogrel], Pravastatin, Simvastatin, Terazosin, Zetia [ezetimibe], Amoxicillin, Mometasone, Shrimp extract, and Sulfa  antibiotics   Review of Systems Review of Systems  Constitutional:  Negative for fever.  Skin:  Positive for rash. Negative for color change.  Hematological:  Negative for adenopathy.     Physical Exam Triage Vital Signs ED Triage Vitals  Encounter Vitals Group     BP 10/26/22 1641 118/66     Systolic BP Percentile --      Diastolic BP Percentile --      Pulse Rate 10/26/22 1641 61     Resp 10/26/22 1641 16     Temp 10/26/22 1641 98.3 F (36.8 C)     Temp src --      SpO2 10/26/22 1641 96 %     Weight --      Height --      Head Circumference --      Peak Flow --      Pain Score 10/26/22 1639 0     Pain Loc --      Pain Education --      Exclude from Growth Chart --    No data found.  Updated Vital Signs BP 118/66 (BP Location: Left Arm)   Pulse 61   Temp 98.3 F (36.8 C)   Resp 16   SpO2 96%      Physical Exam Vitals and nursing note reviewed.  Constitutional:      General: He is not in acute distress.    Appearance: Normal appearance. He is well-developed. He is not ill-appearing.  HENT:     Head: Normocephalic and atraumatic.  Eyes:     Conjunctiva/sclera: Conjunctivae normal.  Cardiovascular:     Rate and Rhythm: Normal rate.  Pulmonary:     Effort: Pulmonary effort is normal. No respiratory distress.  Musculoskeletal:     Cervical back: Neck supple.  Skin:    General: Skin is warm and dry.     Capillary Refill: Capillary refill takes less than 2 seconds.     Findings: Rash present.     Comments: See image included in chart.  Image depicts right axilla.  Rash which is dry, erythematous and consisting of papules, macules, shallow ulcerations.  No tenderness.  Left axilla appears the same.  Neurological:     General: No focal deficit present.     Mental Status: He is alert. Mental status is at baseline.     Motor: No weakness.     Gait: Gait normal.  Psychiatric:        Mood and Affect: Mood normal.        Behavior: Behavior normal.       UC Treatments / Results  Labs (all labs ordered are listed, but only abnormal results are displayed) Labs Reviewed - No data to display  EKG   Radiology No results found.  Procedures Procedures (including critical care time)  Medications Ordered in UC Medications - No data to display  Initial Impression / Assessment and Plan / UC Course  I have reviewed the triage vital signs and the nursing notes.  Pertinent labs & imaging results that were available during my care of the patient were reviewed by me and considered in my medical decision making (see chart for details).   82 year old male presents for pruritic rash of bilateral axilla x 1 month.  Has tried multiple OTC products without relief.  See image included in chart.  Contact dermatitis versus fungal skin infection.  Will treat at this time with Lotrisone.  Discussed using nonscented body washes, detergents, etc.  Supportive care.  Reviewed following up with PCP if no improvement in the next 1 to 2 weeks after using topical cream.   Final Clinical Impressions(s) / UC Diagnoses   Final diagnoses:  Rash and nonspecific skin eruption   Discharge Instructions   None    ED Prescriptions     Medication Sig Dispense Auth. Provider   clotrimazole-betamethasone (LOTRISONE) cream Apply to affected area 2 times daily prn x 7-14 days 45 g Shirlee Latch, PA-C      PDMP not reviewed this encounter.   Shirlee Latch, PA-C 10/26/22 1714

## 2022-10-26 NOTE — ED Triage Notes (Signed)
Pt presents with a rash under bilateral arms x 1 month.

## 2023-06-21 ENCOUNTER — Other Ambulatory Visit: Payer: Self-pay | Admitting: Pulmonary Disease

## 2023-06-21 DIAGNOSIS — J90 Pleural effusion, not elsewhere classified: Secondary | ICD-10-CM

## 2023-06-21 DIAGNOSIS — J920 Pleural plaque with presence of asbestos: Secondary | ICD-10-CM

## 2023-06-21 DIAGNOSIS — J9811 Atelectasis: Secondary | ICD-10-CM

## 2023-06-24 ENCOUNTER — Ambulatory Visit
Admission: RE | Admit: 2023-06-24 | Discharge: 2023-06-24 | Disposition: A | Discharge status: Home / Self Care | Source: Ambulatory Visit | Attending: Pulmonary Disease | Admitting: Pulmonary Disease

## 2023-06-24 DIAGNOSIS — J920 Pleural plaque with presence of asbestos: Secondary | ICD-10-CM | POA: Insufficient documentation

## 2023-06-24 DIAGNOSIS — J9811 Atelectasis: Secondary | ICD-10-CM | POA: Insufficient documentation

## 2023-06-24 DIAGNOSIS — J90 Pleural effusion, not elsewhere classified: Secondary | ICD-10-CM | POA: Diagnosis present

## 2024-01-26 ENCOUNTER — Emergency Department
Admission: EM | Admit: 2024-01-26 | Discharge: 2024-01-26 | Disposition: A | Discharge status: Home / Self Care | Attending: Emergency Medicine | Admitting: Emergency Medicine

## 2024-01-26 ENCOUNTER — Other Ambulatory Visit: Payer: Self-pay

## 2024-01-26 ENCOUNTER — Emergency Department

## 2024-01-26 DIAGNOSIS — Z951 Presence of aortocoronary bypass graft: Secondary | ICD-10-CM | POA: Insufficient documentation

## 2024-01-26 DIAGNOSIS — N189 Chronic kidney disease, unspecified: Secondary | ICD-10-CM | POA: Insufficient documentation

## 2024-01-26 DIAGNOSIS — R0602 Shortness of breath: Secondary | ICD-10-CM

## 2024-01-26 DIAGNOSIS — J441 Chronic obstructive pulmonary disease with (acute) exacerbation: Secondary | ICD-10-CM | POA: Insufficient documentation

## 2024-01-26 DIAGNOSIS — I2510 Atherosclerotic heart disease of native coronary artery without angina pectoris: Secondary | ICD-10-CM | POA: Insufficient documentation

## 2024-01-26 HISTORY — DX: Disorder of kidney and ureter, unspecified: N28.9

## 2024-01-26 LAB — CBC
HCT: 41.5 % (ref 39.0–52.0)
Hemoglobin: 13.3 g/dL (ref 13.0–17.0)
MCH: 30.4 pg (ref 26.0–34.0)
MCHC: 32 g/dL (ref 30.0–36.0)
MCV: 95 fL (ref 80.0–100.0)
Platelets: 177 K/uL (ref 150–400)
RBC: 4.37 MIL/uL (ref 4.22–5.81)
RDW: 13.1 % (ref 11.5–15.5)
WBC: 7.3 K/uL (ref 4.0–10.5)
nRBC: 0 % (ref 0.0–0.2)

## 2024-01-26 LAB — TROPONIN T, HIGH SENSITIVITY
Troponin T High Sensitivity: 23 ng/L — ABNORMAL HIGH (ref 0–19)
Troponin T High Sensitivity: 19 ng/L (ref 0–19)

## 2024-01-26 LAB — BASIC METABOLIC PANEL WITH GFR
Anion gap: 11 (ref 5–15)
BUN: 27 mg/dL — ABNORMAL HIGH (ref 8–23)
CO2: 24 mmol/L (ref 22–32)
Calcium: 9.3 mg/dL (ref 8.9–10.3)
Chloride: 104 mmol/L (ref 98–111)
Creatinine, Ser: 2.23 mg/dL — ABNORMAL HIGH (ref 0.61–1.24)
GFR, Estimated: 28 mL/min — ABNORMAL LOW (ref >60)
Glucose, Bld: 102 mg/dL — ABNORMAL HIGH (ref 70–99)
Potassium: 4.3 mmol/L (ref 3.5–5.1)
Sodium: 139 mmol/L (ref 135–145)

## 2024-01-26 LAB — HEPATIC FUNCTION PANEL
ALT: 31 U/L (ref 0–44)
AST: 31 U/L (ref 15–41)
Albumin: 4.3 g/dL (ref 3.5–5.0)
Alkaline Phosphatase: 80 U/L (ref 38–126)
Bilirubin, Direct: 0.2 mg/dL (ref 0.0–0.2)
Indirect Bilirubin: 0.3 mg/dL (ref 0.3–0.9)
Total Bilirubin: 0.4 mg/dL (ref 0.0–1.2)
Total Protein: 7.7 g/dL (ref 6.5–8.1)

## 2024-01-26 LAB — PRO BRAIN NATRIURETIC PEPTIDE: Pro Brain Natriuretic Peptide: 223 pg/mL (ref <300.0)

## 2024-01-26 LAB — LIPASE, BLOOD: Lipase: 71 U/L — ABNORMAL HIGH (ref 11–51)

## 2024-01-26 MED ORDER — PREDNISONE 20 MG PO TABS: 40.0000 mg | ORAL_TABLET | Freq: Every day | ORAL | 0 refills | Status: AC

## 2024-01-26 MED ORDER — PREDNISONE 20 MG PO TABS
40.0000 mg | ORAL_TABLET | Freq: Once | ORAL | Status: AC
  Administered 2024-01-26: 40 mg via ORAL
  Filled 2024-01-26: qty 2

## 2024-01-26 MED ORDER — ALBUTEROL SULFATE HFA 108 (90 BASE) MCG/ACT IN AERS: 2.0000 | INHALATION_SPRAY | Freq: Four times a day (QID) | RESPIRATORY_TRACT | 2 refills | Status: AC | PRN

## 2024-01-26 MED ORDER — IPRATROPIUM-ALBUTEROL 0.5-2.5 (3) MG/3ML IN SOLN
6.0000 mL | Freq: Once | RESPIRATORY_TRACT | Status: AC
  Administered 2024-01-26: 6 mL via RESPIRATORY_TRACT
  Filled 2024-01-26: qty 6

## 2024-01-26 MED ORDER — IPRATROPIUM-ALBUTEROL 0.5-2.5 (3) MG/3ML IN SOLN
3.0000 mL | Freq: Once | RESPIRATORY_TRACT | Status: AC
  Administered 2024-01-26: 3 mL via RESPIRATORY_TRACT
  Filled 2024-01-26: qty 3

## 2024-01-26 NOTE — Discharge Instructions (Signed)
 Prednisone  steroids once daily for 4 more days.  We already gave you 1 dose for today (Thursday) please start this medication at home on Friday  Albuterol  inhaler to use as needed in addition to your normal medications  Return to the ED with any worsening symptoms despite these measures

## 2024-01-26 NOTE — ED Triage Notes (Signed)
 Pt to ED for congestion in my R lung since 3-4 days. Endorses intermittent SOB. Respirations are unlabored. Pt states R lung is hurting, worse with inspiration.

## 2024-01-26 NOTE — ED Provider Notes (Signed)
 [facility] Provider Note    Event Date/Time   First MD Initiated Contact with Patient 01/26/24 1122     (approximate)   History   Shortness of Breath   HPI  Phillip Chavez is a 83 y.o. male who presents to the ED for evaluation of Shortness of Breath   Reviewed nephrology clinic visit from 2 years ago.  History of CKD GFR around 25, CAD s/p CABG x 1, COPD.  History of AKI requiring hemodialysis with recovering renal function and subsequent removal of dialysis catheter.  Patient presents to the ED for evaluation of right sided chest discomfort, cough, shortness of breath, dyspnea on exertion over the past 4 days.   Physical Exam   Triage Vital Signs: ED Triage Vitals  Encounter Vitals Group     BP 01/26/24 1115 137/75     Girls Systolic BP Percentile --      Girls Diastolic BP Percentile --      Boys Systolic BP Percentile --      Boys Diastolic BP Percentile --      Pulse Rate 01/26/24 1115 61     Resp 01/26/24 1115 20     Temp 01/26/24 1115 97.8 F (36.6 C)     Temp Source 01/26/24 1115 Oral     SpO2 01/26/24 1115 96 %     Weight 01/26/24 1118 176 lb (79.8 kg)     Height 01/26/24 1118 5' 4 (1.626 m)     Head Circumference --      Peak Flow --      Pain Score 01/26/24 1116 8     Pain Loc --      Pain Education --      Exclude from Growth Chart --     Most recent vital signs: Vitals:   01/26/24 1300 01/26/24 1330  BP: 137/67 134/66  Pulse: (!) 58 (!) 59  Resp: 16 12  Temp:    SpO2: 94% 100%    General: Awake, no distress.  CV:  Good peripheral perfusion.  Resp:  Normal effort.  No tachypnea, faint and scattered expiratory wheezes, slightly decreased airflow Abd:  No distention.  MSK:  No deformity noted.  Neuro:  No focal deficits appreciated. Other:     ED Results / Procedures / Treatments   Labs (all labs ordered are listed, but only abnormal results are displayed) Labs Reviewed  BASIC METABOLIC PANEL WITH GFR -  Abnormal; Notable for the following components:      Result Value   Glucose, Bld 102 (*)    BUN 27 (*)    Creatinine, Ser 2.23 (*)    GFR, Estimated 28 (*)    All other components within normal limits  LIPASE, BLOOD - Abnormal; Notable for the following components:   Lipase 71 (*)    All other components within normal limits  TROPONIN T, HIGH SENSITIVITY - Abnormal; Notable for the following components:   Troponin T High Sensitivity 23 (*)    All other components within normal limits  CBC  PRO BRAIN NATRIURETIC PEPTIDE  HEPATIC FUNCTION PANEL  TROPONIN T, HIGH SENSITIVITY    EKG Sinus rhythm with a rate of 51 bpm, first-degree AV block without evidence of high-grade block.  No clear signs of acute ischemia.  RADIOLOGY CXR interpreted by me without evidence of acute cardiopulmonary pathology.  Official radiology report(s): DG Chest 2 View Result Date: 01/26/2024 CLINICAL DATA:  Shortness of breath and chest congestion. EXAM: CHEST -  2 VIEW COMPARISON:  08/12/2022 FINDINGS: The heart size and mediastinal contours are within normal limits. Prior median sternotomy and aortic valve replacement. Left atrial appendage clip again noted. Pulmonary emphysema again seen. Stable mild bibasilar pleural thickening. Both lungs are clear. The visualized skeletal structures are unremarkable. IMPRESSION: No active cardiopulmonary disease. Emphysema. Electronically Signed   By: Norleen DELENA Kil M.D.   On: 01/26/2024 12:03    PROCEDURES and INTERVENTIONS:  .1-3 Lead EKG Interpretation  Performed by: Claudene Rover, MD Authorized by: Claudene Rover, MD     Interpretation: normal     ECG rate:  62   ECG rate assessment: normal     Rhythm: sinus rhythm     Ectopy: none     Conduction: normal     Medications  ipratropium-albuterol  (DUONEB) 0.5-2.5 (3) MG/3ML nebulizer solution 6 mL (6 mLs Nebulization Given 01/26/24 1226)  predniSONE  (DELTASONE ) tablet 40 mg (40 mg Oral Given 01/26/24 1225)   ipratropium-albuterol  (DUONEB) 0.5-2.5 (3) MG/3ML nebulizer solution 3 mL (3 mLs Nebulization Given 01/26/24 1314)     IMPRESSION / MDM / ASSESSMENT AND PLAN / ED COURSE  I reviewed the triage vital signs and the nursing notes.  Differential diagnosis includes, but is not limited to, ACS, PTX, PNA, muscle strain/spasm, PE, dissection, anxiety, pleural effusion  {Patient presents with symptoms of an acute illness or injury that is potentially life-threatening.  Patient presents to the ED with evidence of COPD exacerbation suitable for outpatient management.  Looks well without tachypnea, very faint wheezing on exam.  With breathing treatments he is moving much better air, wheezing more pronounced and improving on subsequent nebulizers.  He is started on steroids.  Otherwise reassuring workup with clear CXR, no indications for antibiotics.  Downtrending and fairly low troponins, doubt ACS, low BNP, CKD at baseline, normal CBC.  Clinical Course as of 01/26/24 1402  Thu Jan 26, 2024  1211 Reassessed and discussed plan of care [DS]  1305 Reassessed, he reports feeling much better.  On auscultation wheezing is much louder, improved airflow.  We discussed additional nebulizers, generally reassuring workup otherwise [DS]    Clinical Course User Index [DS] Claudene Rover, MD     FINAL CLINICAL IMPRESSION(S) / ED DIAGNOSES   Final diagnoses:  COPD exacerbation (HCC)  Shortness of breath     Rx / DC Orders   ED Discharge Orders          Ordered    predniSONE  (DELTASONE ) 20 MG tablet  Daily        01/26/24 1312    albuterol  (VENTOLIN  HFA) 108 (90 Base) MCG/ACT inhaler  Every 6 hours PRN        01/26/24 1312             Note:  This document was prepared using Dragon voice recognition software and may include unintentional dictation errors.   Claudene Rover, MD 01/26/24 [phone]
